# Patient Record
Sex: Female | Born: 1955 | Race: Black or African American | Hispanic: No | Marital: Single | Smoking: Never smoker
Health system: Southern US, Community
[De-identification: ages and names within clinical notes are randomized; demographics above are authoritative.]

## PROBLEM LIST (undated history)

## (undated) ENCOUNTER — Emergency Department (HOSPITAL_COMMUNITY): Admission: EM | Payer: BC Managed Care – PPO | Source: Home / Self Care

## (undated) DIAGNOSIS — Z85528 Personal history of other malignant neoplasm of kidney: Secondary | ICD-10-CM

## (undated) DIAGNOSIS — M1990 Unspecified osteoarthritis, unspecified site: Secondary | ICD-10-CM

## (undated) DIAGNOSIS — G4730 Sleep apnea, unspecified: Secondary | ICD-10-CM

## (undated) DIAGNOSIS — Z8614 Personal history of Methicillin resistant Staphylococcus aureus infection: Secondary | ICD-10-CM

## (undated) DIAGNOSIS — F329 Major depressive disorder, single episode, unspecified: Secondary | ICD-10-CM

## (undated) DIAGNOSIS — IMO0002 Reserved for concepts with insufficient information to code with codable children: Secondary | ICD-10-CM

## (undated) DIAGNOSIS — N393 Stress incontinence (female) (male): Secondary | ICD-10-CM

## (undated) DIAGNOSIS — M329 Systemic lupus erythematosus, unspecified: Secondary | ICD-10-CM

## (undated) DIAGNOSIS — N816 Rectocele: Secondary | ICD-10-CM

## (undated) DIAGNOSIS — N183 Chronic kidney disease, stage 3 unspecified: Secondary | ICD-10-CM

## (undated) DIAGNOSIS — Z87898 Personal history of other specified conditions: Secondary | ICD-10-CM

## (undated) DIAGNOSIS — C649 Malignant neoplasm of unspecified kidney, except renal pelvis: Secondary | ICD-10-CM

## (undated) DIAGNOSIS — Z8541 Personal history of malignant neoplasm of cervix uteri: Secondary | ICD-10-CM

## (undated) DIAGNOSIS — G2581 Restless legs syndrome: Secondary | ICD-10-CM

## (undated) DIAGNOSIS — C801 Malignant (primary) neoplasm, unspecified: Secondary | ICD-10-CM

## (undated) DIAGNOSIS — A630 Anogenital (venereal) warts: Secondary | ICD-10-CM

## (undated) DIAGNOSIS — R7303 Prediabetes: Secondary | ICD-10-CM

## (undated) DIAGNOSIS — G4733 Obstructive sleep apnea (adult) (pediatric): Secondary | ICD-10-CM

## (undated) DIAGNOSIS — K5909 Other constipation: Secondary | ICD-10-CM

## (undated) DIAGNOSIS — E785 Hyperlipidemia, unspecified: Secondary | ICD-10-CM

## (undated) DIAGNOSIS — R569 Unspecified convulsions: Secondary | ICD-10-CM

## (undated) DIAGNOSIS — I10 Essential (primary) hypertension: Secondary | ICD-10-CM

## (undated) DIAGNOSIS — Q600 Renal agenesis, unilateral: Secondary | ICD-10-CM

## (undated) DIAGNOSIS — K469 Unspecified abdominal hernia without obstruction or gangrene: Secondary | ICD-10-CM

## (undated) DIAGNOSIS — Z86711 Personal history of pulmonary embolism: Secondary | ICD-10-CM

## (undated) DIAGNOSIS — K432 Incisional hernia without obstruction or gangrene: Secondary | ICD-10-CM

## (undated) DIAGNOSIS — Z973 Presence of spectacles and contact lenses: Secondary | ICD-10-CM

## (undated) DIAGNOSIS — E559 Vitamin D deficiency, unspecified: Secondary | ICD-10-CM

## (undated) DIAGNOSIS — F419 Anxiety disorder, unspecified: Secondary | ICD-10-CM

## (undated) DIAGNOSIS — I2699 Other pulmonary embolism without acute cor pulmonale: Secondary | ICD-10-CM

## (undated) HISTORY — DX: Major depressive disorder, single episode, unspecified: F32.9

## (undated) HISTORY — DX: Restless legs syndrome: G25.81

## (undated) HISTORY — DX: Vitamin D deficiency, unspecified: E55.9

## (undated) HISTORY — DX: Unspecified osteoarthritis, unspecified site: M19.90

## (undated) HISTORY — DX: Unspecified abdominal hernia without obstruction or gangrene: K46.9

## (undated) HISTORY — DX: Malignant (primary) neoplasm, unspecified: C80.1

## (undated) HISTORY — DX: Unspecified convulsions: R56.9

## (undated) HISTORY — PX: ABDOMINAL HYSTERECTOMY: SHX81

## (undated) HISTORY — DX: Other pulmonary embolism without acute cor pulmonale: I26.99

## (undated) HISTORY — PX: OTHER SURGICAL HISTORY: SHX169

## (undated) HISTORY — DX: Other constipation: K59.09

## (undated) HISTORY — DX: Essential (primary) hypertension: I10

## (undated) HISTORY — PX: COLONOSCOPY: SHX174

## (undated) HISTORY — DX: Prediabetes: R73.03

## (undated) HISTORY — DX: Reserved for concepts with insufficient information to code with codable children: IMO0002

## (undated) HISTORY — DX: Malignant neoplasm of unspecified kidney, except renal pelvis: C64.9

## (undated) HISTORY — DX: Hyperlipidemia, unspecified: E78.5

## (undated) HISTORY — DX: Anogenital (venereal) warts: A63.0

---
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
## 1998-02-25 ENCOUNTER — Ambulatory Visit (HOSPITAL_COMMUNITY): Admission: RE | Admit: 1998-02-25 | Discharge: 1998-02-25 | Payer: Self-pay | Admitting: Obstetrics

## 1998-02-25 ENCOUNTER — Encounter: Admission: RE | Admit: 1998-02-25 | Discharge: 1998-02-25 | Payer: Self-pay | Admitting: Obstetrics

## 1999-08-16 ENCOUNTER — Emergency Department (HOSPITAL_COMMUNITY): Admission: EM | Admit: 1999-08-16 | Discharge: 1999-08-16 | Payer: Self-pay | Admitting: Emergency Medicine

## 1999-09-21 ENCOUNTER — Other Ambulatory Visit: Admission: RE | Admit: 1999-09-21 | Discharge: 1999-09-21 | Payer: Self-pay | Admitting: Gynecology

## 1999-09-30 ENCOUNTER — Other Ambulatory Visit: Admission: RE | Admit: 1999-09-30 | Discharge: 1999-09-30 | Payer: Self-pay | Admitting: Gynecology

## 1999-09-30 ENCOUNTER — Encounter (INDEPENDENT_AMBULATORY_CARE_PROVIDER_SITE_OTHER): Payer: Self-pay

## 1999-10-02 ENCOUNTER — Encounter: Payer: Self-pay | Admitting: Gynecology

## 1999-10-02 ENCOUNTER — Ambulatory Visit (HOSPITAL_COMMUNITY): Admission: RE | Admit: 1999-10-02 | Discharge: 1999-10-02 | Payer: Self-pay | Admitting: Gynecology

## 1999-10-05 ENCOUNTER — Encounter: Payer: Self-pay | Admitting: Gynecology

## 1999-10-05 ENCOUNTER — Ambulatory Visit (HOSPITAL_COMMUNITY): Admission: RE | Admit: 1999-10-05 | Discharge: 1999-10-05 | Payer: Self-pay | Admitting: Gynecology

## 1999-10-06 ENCOUNTER — Encounter: Payer: Self-pay | Admitting: Gynecology

## 1999-10-07 ENCOUNTER — Ambulatory Visit (HOSPITAL_COMMUNITY): Admission: RE | Admit: 1999-10-07 | Discharge: 1999-10-07 | Payer: Self-pay | Admitting: Gynecology

## 1999-10-07 ENCOUNTER — Encounter (INDEPENDENT_AMBULATORY_CARE_PROVIDER_SITE_OTHER): Payer: Self-pay

## 1999-10-22 ENCOUNTER — Ambulatory Visit (HOSPITAL_COMMUNITY): Admission: RE | Admit: 1999-10-22 | Discharge: 1999-10-22 | Payer: Self-pay | Admitting: Gastroenterology

## 1999-10-22 ENCOUNTER — Encounter (INDEPENDENT_AMBULATORY_CARE_PROVIDER_SITE_OTHER): Payer: Self-pay | Admitting: Specialist

## 1999-10-28 ENCOUNTER — Ambulatory Visit (HOSPITAL_COMMUNITY): Admission: RE | Admit: 1999-10-28 | Discharge: 1999-10-28 | Payer: Self-pay | Admitting: Gastroenterology

## 1999-11-05 ENCOUNTER — Ambulatory Visit (HOSPITAL_COMMUNITY): Admission: RE | Admit: 1999-11-05 | Discharge: 1999-11-05 | Payer: Self-pay | Admitting: Gastroenterology

## 1999-11-05 ENCOUNTER — Encounter: Payer: Self-pay | Admitting: Gastroenterology

## 1999-11-06 ENCOUNTER — Encounter: Payer: Self-pay | Admitting: Gastroenterology

## 1999-11-06 ENCOUNTER — Ambulatory Visit (HOSPITAL_COMMUNITY): Admission: RE | Admit: 1999-11-06 | Discharge: 1999-11-06 | Payer: Self-pay | Admitting: Gastroenterology

## 1999-11-10 ENCOUNTER — Ambulatory Visit: Admission: RE | Admit: 1999-11-10 | Discharge: 1999-11-10 | Payer: Self-pay | Admitting: Gynecology

## 1999-11-19 ENCOUNTER — Encounter: Payer: Self-pay | Admitting: Gynecology

## 1999-11-24 ENCOUNTER — Inpatient Hospital Stay (HOSPITAL_COMMUNITY): Admission: RE | Admit: 1999-11-24 | Discharge: 1999-11-28 | Payer: Self-pay | Admitting: Gynecology

## 1999-11-24 ENCOUNTER — Encounter (INDEPENDENT_AMBULATORY_CARE_PROVIDER_SITE_OTHER): Payer: Self-pay | Admitting: Specialist

## 2000-01-03 ENCOUNTER — Emergency Department (HOSPITAL_COMMUNITY): Admission: EM | Admit: 2000-01-03 | Discharge: 2000-01-03 | Payer: Self-pay | Admitting: Emergency Medicine

## 2000-01-04 ENCOUNTER — Encounter: Payer: Self-pay | Admitting: Emergency Medicine

## 2000-01-08 ENCOUNTER — Emergency Department (HOSPITAL_COMMUNITY): Admission: EM | Admit: 2000-01-08 | Discharge: 2000-01-08 | Payer: Self-pay | Admitting: Emergency Medicine

## 2000-01-09 ENCOUNTER — Ambulatory Visit: Admission: RE | Admit: 2000-01-09 | Discharge: 2000-01-09 | Payer: Self-pay | Admitting: Gastroenterology

## 2000-01-09 ENCOUNTER — Encounter: Payer: Self-pay | Admitting: Gastroenterology

## 2000-01-10 ENCOUNTER — Emergency Department (HOSPITAL_COMMUNITY): Admission: EM | Admit: 2000-01-10 | Discharge: 2000-01-10 | Payer: Self-pay | Admitting: Emergency Medicine

## 2000-01-25 ENCOUNTER — Encounter: Payer: Self-pay | Admitting: Emergency Medicine

## 2000-01-25 ENCOUNTER — Inpatient Hospital Stay (HOSPITAL_COMMUNITY): Admission: EM | Admit: 2000-01-25 | Discharge: 2000-02-01 | Payer: Self-pay | Admitting: Emergency Medicine

## 2000-01-25 ENCOUNTER — Encounter: Payer: Self-pay | Admitting: Gynecology

## 2000-01-26 ENCOUNTER — Encounter: Payer: Self-pay | Admitting: Gynecology

## 2000-02-16 ENCOUNTER — Encounter: Admission: RE | Admit: 2000-02-16 | Discharge: 2000-02-16 | Payer: Self-pay | Admitting: Family Medicine

## 2000-02-18 ENCOUNTER — Encounter: Payer: Self-pay | Admitting: Emergency Medicine

## 2000-02-19 ENCOUNTER — Inpatient Hospital Stay (HOSPITAL_COMMUNITY): Admission: EM | Admit: 2000-02-19 | Discharge: 2000-02-23 | Payer: Self-pay | Admitting: Emergency Medicine

## 2000-02-20 ENCOUNTER — Encounter: Payer: Self-pay | Admitting: Internal Medicine

## 2000-03-03 ENCOUNTER — Encounter: Admission: RE | Admit: 2000-03-03 | Discharge: 2000-03-03 | Payer: Self-pay | Admitting: Family Medicine

## 2000-03-07 ENCOUNTER — Other Ambulatory Visit: Admission: RE | Admit: 2000-03-07 | Discharge: 2000-03-07 | Payer: Self-pay | Admitting: Gynecology

## 2000-03-08 ENCOUNTER — Encounter: Admission: RE | Admit: 2000-03-08 | Discharge: 2000-03-08 | Payer: Self-pay | Admitting: Family Medicine

## 2000-03-16 ENCOUNTER — Encounter: Admission: RE | Admit: 2000-03-16 | Discharge: 2000-03-16 | Payer: Self-pay | Admitting: Family Medicine

## 2000-03-24 ENCOUNTER — Encounter: Admission: RE | Admit: 2000-03-24 | Discharge: 2000-03-24 | Payer: Self-pay | Admitting: Family Medicine

## 2000-04-13 ENCOUNTER — Encounter: Admission: RE | Admit: 2000-04-13 | Discharge: 2000-04-13 | Payer: Self-pay | Admitting: Family Medicine

## 2000-04-20 ENCOUNTER — Encounter: Admission: RE | Admit: 2000-04-20 | Discharge: 2000-04-20 | Payer: Self-pay | Admitting: Family Medicine

## 2000-04-26 ENCOUNTER — Encounter: Admission: RE | Admit: 2000-04-26 | Discharge: 2000-04-26 | Payer: Self-pay | Admitting: Family Medicine

## 2000-05-04 ENCOUNTER — Encounter: Admission: RE | Admit: 2000-05-04 | Discharge: 2000-05-04 | Payer: Self-pay | Admitting: Family Medicine

## 2000-06-02 ENCOUNTER — Encounter: Admission: RE | Admit: 2000-06-02 | Discharge: 2000-06-02 | Payer: Self-pay | Admitting: Family Medicine

## 2000-06-13 ENCOUNTER — Encounter: Admission: RE | Admit: 2000-06-13 | Discharge: 2000-06-13 | Payer: Self-pay | Admitting: Family Medicine

## 2000-06-15 ENCOUNTER — Encounter: Admission: RE | Admit: 2000-06-15 | Discharge: 2000-06-15 | Payer: Self-pay | Admitting: Family Medicine

## 2000-07-13 ENCOUNTER — Encounter: Admission: RE | Admit: 2000-07-13 | Discharge: 2000-07-13 | Payer: Self-pay | Admitting: Family Medicine

## 2000-08-10 ENCOUNTER — Encounter: Admission: RE | Admit: 2000-08-10 | Discharge: 2000-08-10 | Payer: Self-pay | Admitting: Family Medicine

## 2000-09-07 ENCOUNTER — Encounter: Admission: RE | Admit: 2000-09-07 | Discharge: 2000-09-07 | Payer: Self-pay | Admitting: Family Medicine

## 2000-09-15 ENCOUNTER — Encounter: Admission: RE | Admit: 2000-09-15 | Discharge: 2000-09-15 | Payer: Self-pay | Admitting: Sports Medicine

## 2000-09-15 ENCOUNTER — Encounter: Payer: Self-pay | Admitting: Sports Medicine

## 2000-10-15 ENCOUNTER — Encounter (INDEPENDENT_AMBULATORY_CARE_PROVIDER_SITE_OTHER): Payer: Self-pay | Admitting: *Deleted

## 2000-10-15 LAB — CONVERTED CEMR LAB

## 2000-10-19 ENCOUNTER — Encounter: Admission: RE | Admit: 2000-10-19 | Discharge: 2000-10-19 | Payer: Self-pay | Admitting: Family Medicine

## 2000-11-03 ENCOUNTER — Encounter: Admission: RE | Admit: 2000-11-03 | Discharge: 2000-11-03 | Payer: Self-pay | Admitting: Family Medicine

## 2000-12-14 ENCOUNTER — Encounter: Admission: RE | Admit: 2000-12-14 | Discharge: 2000-12-14 | Payer: Self-pay | Admitting: Sports Medicine

## 2000-12-14 ENCOUNTER — Encounter: Payer: Self-pay | Admitting: Sports Medicine

## 2001-07-18 ENCOUNTER — Encounter: Admission: RE | Admit: 2001-07-18 | Discharge: 2001-07-18 | Payer: Self-pay | Admitting: Family Medicine

## 2001-07-26 ENCOUNTER — Encounter: Admission: RE | Admit: 2001-07-26 | Discharge: 2001-07-26 | Payer: Self-pay | Admitting: Family Medicine

## 2002-04-03 ENCOUNTER — Encounter: Admission: RE | Admit: 2002-04-03 | Discharge: 2002-04-03 | Payer: Self-pay | Admitting: Sports Medicine

## 2003-02-14 ENCOUNTER — Encounter: Admission: RE | Admit: 2003-02-14 | Discharge: 2003-02-14 | Payer: Self-pay | Admitting: Family Medicine

## 2003-03-20 ENCOUNTER — Encounter: Admission: RE | Admit: 2003-03-20 | Discharge: 2003-03-20 | Payer: Self-pay | Admitting: Family Medicine

## 2003-03-28 ENCOUNTER — Encounter: Admission: RE | Admit: 2003-03-28 | Discharge: 2003-03-28 | Payer: Self-pay | Admitting: Family Medicine

## 2003-04-09 ENCOUNTER — Encounter: Admission: RE | Admit: 2003-04-09 | Discharge: 2003-04-09 | Payer: Self-pay | Admitting: Family Medicine

## 2003-04-15 ENCOUNTER — Encounter: Admission: RE | Admit: 2003-04-15 | Discharge: 2003-04-15 | Payer: Self-pay | Admitting: Family Medicine

## 2003-04-22 ENCOUNTER — Encounter: Admission: RE | Admit: 2003-04-22 | Discharge: 2003-04-22 | Payer: Self-pay | Admitting: Family Medicine

## 2003-05-01 ENCOUNTER — Encounter: Admission: RE | Admit: 2003-05-01 | Discharge: 2003-05-01 | Payer: Self-pay | Admitting: Family Medicine

## 2003-05-06 ENCOUNTER — Encounter: Admission: RE | Admit: 2003-05-06 | Discharge: 2003-05-06 | Payer: Self-pay | Admitting: Family Medicine

## 2003-05-14 ENCOUNTER — Encounter: Admission: RE | Admit: 2003-05-14 | Discharge: 2003-05-14 | Payer: Self-pay | Admitting: Family Medicine

## 2003-05-14 ENCOUNTER — Inpatient Hospital Stay (HOSPITAL_COMMUNITY): Admission: AD | Admit: 2003-05-14 | Discharge: 2003-05-25 | Payer: Self-pay | Admitting: Family Medicine

## 2003-05-19 LAB — HM COLONOSCOPY

## 2003-06-15 ENCOUNTER — Inpatient Hospital Stay (HOSPITAL_COMMUNITY): Admission: EM | Admit: 2003-06-15 | Discharge: 2003-07-05 | Payer: Self-pay | Admitting: *Deleted

## 2003-06-18 ENCOUNTER — Encounter: Payer: Self-pay | Admitting: Internal Medicine

## 2003-06-19 ENCOUNTER — Encounter (INDEPENDENT_AMBULATORY_CARE_PROVIDER_SITE_OTHER): Payer: Self-pay | Admitting: *Deleted

## 2003-07-05 ENCOUNTER — Inpatient Hospital Stay (HOSPITAL_COMMUNITY)
Admission: RE | Admit: 2003-07-05 | Discharge: 2003-07-22 | Payer: Self-pay | Admitting: Physical Medicine & Rehabilitation

## 2003-08-07 ENCOUNTER — Encounter
Admission: RE | Admit: 2003-08-07 | Discharge: 2003-11-05 | Payer: Self-pay | Admitting: Physical Medicine & Rehabilitation

## 2004-01-07 ENCOUNTER — Encounter
Admission: RE | Admit: 2004-01-07 | Discharge: 2004-04-06 | Payer: Self-pay | Admitting: Physical Medicine & Rehabilitation

## 2004-01-14 ENCOUNTER — Other Ambulatory Visit: Admission: RE | Admit: 2004-01-14 | Discharge: 2004-01-14 | Payer: Self-pay | Admitting: Internal Medicine

## 2004-01-17 ENCOUNTER — Encounter: Admission: RE | Admit: 2004-01-17 | Discharge: 2004-01-17 | Payer: Self-pay | Admitting: Internal Medicine

## 2004-09-07 ENCOUNTER — Encounter: Admission: RE | Admit: 2004-09-07 | Discharge: 2004-09-07 | Payer: Self-pay

## 2005-02-01 ENCOUNTER — Encounter: Admission: RE | Admit: 2005-02-01 | Discharge: 2005-02-01 | Payer: Self-pay | Admitting: Internal Medicine

## 2005-05-18 LAB — HM DEXA SCAN

## 2005-09-14 ENCOUNTER — Ambulatory Visit: Payer: Self-pay | Admitting: Internal Medicine

## 2005-11-30 ENCOUNTER — Ambulatory Visit: Payer: Self-pay | Admitting: Internal Medicine

## 2005-12-19 ENCOUNTER — Encounter: Admission: RE | Admit: 2005-12-19 | Discharge: 2005-12-19 | Payer: Self-pay | Admitting: Internal Medicine

## 2006-01-06 ENCOUNTER — Other Ambulatory Visit: Admission: RE | Admit: 2006-01-06 | Discharge: 2006-01-06 | Payer: Self-pay | Admitting: Obstetrics and Gynecology

## 2006-01-17 ENCOUNTER — Emergency Department (HOSPITAL_COMMUNITY): Admission: EM | Admit: 2006-01-17 | Discharge: 2006-01-17 | Payer: Self-pay | Admitting: *Deleted

## 2006-02-02 ENCOUNTER — Encounter: Admission: RE | Admit: 2006-02-02 | Discharge: 2006-02-02 | Payer: Self-pay | Admitting: Internal Medicine

## 2006-07-14 DIAGNOSIS — K5909 Other constipation: Secondary | ICD-10-CM | POA: Insufficient documentation

## 2006-07-14 DIAGNOSIS — Z9109 Other allergy status, other than to drugs and biological substances: Secondary | ICD-10-CM | POA: Insufficient documentation

## 2006-07-14 DIAGNOSIS — M329 Systemic lupus erythematosus, unspecified: Secondary | ICD-10-CM | POA: Insufficient documentation

## 2006-07-14 HISTORY — DX: Other constipation: K59.09

## 2006-07-15 ENCOUNTER — Encounter (INDEPENDENT_AMBULATORY_CARE_PROVIDER_SITE_OTHER): Payer: Self-pay | Admitting: *Deleted

## 2007-02-14 ENCOUNTER — Encounter: Admission: RE | Admit: 2007-02-14 | Discharge: 2007-02-14 | Payer: Self-pay | Admitting: Internal Medicine

## 2008-02-15 ENCOUNTER — Encounter: Admission: RE | Admit: 2008-02-15 | Discharge: 2008-02-15 | Payer: Self-pay | Admitting: Internal Medicine

## 2008-05-18 LAB — HM PAP SMEAR: HM Pap smear: NEGATIVE

## 2009-02-24 ENCOUNTER — Encounter: Admission: RE | Admit: 2009-02-24 | Discharge: 2009-02-24 | Payer: Self-pay | Admitting: Internal Medicine

## 2010-02-25 ENCOUNTER — Encounter: Admission: RE | Admit: 2010-02-25 | Discharge: 2010-02-25 | Payer: Self-pay | Admitting: Internal Medicine

## 2010-10-02 NOTE — H&P (Signed)
NAME:  Beth, [name] NO.:  [id number]   MEDICAL RECORD NO.:  [id number]                   PATIENT TYPE:  INP   LOCATION:  0108                                 FACILITY:  [facility]   PHYSICIAN:  Leonia Reeves, MD                 DATE OF BIRTH:  Aug 17, 1955   DATE OF ADMISSION:  06/14/2003  DATE OF DISCHARGE:                                HISTORY & PHYSICAL   HISTORY OF PRESENT ILLNESS:  Beth [name] is a 55 year old, black female with  known lupus and history of lupus with arthritis, most recently in January  2005, when she was admitted by family practice at [facility] with altered  mental status.  Patient can only supply a limited history today, so most of  the history is obtained from the records.  The patient had a witnessed  seizure this evening at approximately 5 p.m.  It was witnessed by her  mother.  EMS' reports state that the mother describes a grand mal seizure  few minutes in duration.  This is supported by the ER attending doctor's  note, who was also able to speak with the mother.  The patient did have  urinary incontinence.  There was no head injury and no biting of her tongue.  The patient herself is unable to give any account of what happened tonight  but did state that she was in her normal state of health earlier today.  She  does have a history of a seizure in October 2002, that, according to the  medical records, on E chart was described as a generalized seizure.  During  her most recent hospitalization in January 2005, she was seen in  consultation by neurology, who, among other things, recommended  hypercoagulable work-up that included a normal lupus anticoagulant,  cardiolipin, and antiphospholipid antibody.  She was found to be ANA  positive and double-stranded DNA negative.  Her sed rate at that time was  _______.   PAST MEDICAL HISTORY:  1. History of lupus.     A. 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     B. 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     C. Hypercoagulable work-up in January 2005 was negative.  This included        lupus anticoagulant, cardiolipin, and antiphospholipid antibody        negative.     D. Diagnosis was made at an unknown date, but she is ANA positive and        double-stranded DNA negative.  2. Depression.     A. The patient was previously on Zoloft.  3. History of Meckel's diverticulum resection 2001.   MEDICATIONS:  1. Plaquenil 200 p.o. daily.  2. Prednisone thought to be 40 mg once daily.  3. Aspirin 325 daily.  4. The patient had been recommended to take calcium and vitamin D,  but she     is not.   REVIEW OF SYSTEMS:  The patient denies headache, chest pain, nausea,  vomiting, diarrhea, dysuria.  She does state that she has a little bit of  dyspnea which is normal for her.   SOCIAL HISTORY:  The patient is a nonsmoker.  No alcohol use.  She is  unemployed.  She is single.  She has 3 adult kids.  She has 2 years of  college.   FAMILY HISTORY:  Her mother is 52 and is alive and well.  Father's health is  unknown.  Her children are healthy.   PHYSICAL EXAMINATION:  VITAL SIGNS:  Blood pressure 101/54, temperature  101.1, pulse 75, respirations 18, pulse oxygen 94% on room air.  GENERAL:  The patient is lethargic but easily aroused.  Will speak in very  short sentences.  NEUROLOGIC:  The patient is alert and oriented.  No cranial nerve  abnormalities.  Strength is globally weak, about 4+/5 but no focal deficits.  Reflexes are hyperactive bilaterally upper and lower extremities.  Babinski  is withdraw.  HEENT:  Pupils equal, round, and reactive to light.  Extraocular muscles  intact.  Tongue has a thick coating on it.  Throat is slightly erythematous.  LUNGS:  Clear to auscultation bilaterally.  NECK:  No lymphadenopathy, no carotid bruit.  HEART:  Regular rate and rhythm.  No murmurs, rubs, or gallops.   ABDOMEN:  Positive bowel sounds, soft, nontender, nondistended.  EXTREMITIES:  No edema.  No skin rashes.   LABORATORY DATA:  Pertinent findings included hemoglobin 11.5, glucose 152,  albumin 2.2, AST 51, positive leukocyte esterases with 0-2 white blood  cells.  MRI showed a 5 mm increased density in the high left frontal lobe,  representing a parenchymal calcification versus a tiny hemorrhage.   ASSESSMENT/PLAN:  1. Witnessed generalized seizure.  Most likely this represents another flare     of lupus cerebritis.  Therefore, will increase her prednisone back up to     60 daily.  Will continue the Plaquenil.  Due to the findings on the CT     scan, we will check an MRI of the head to rule out any significant     hemorrhage.  Since I am uncertain at this time whether or not she has any     hemorrhage, will not use her aspirin which she takes as a home     medication.  We will also work up any other possible etiologies of     seizures including metabolic derangement, infection, and drugs.  The     patient has already been worked up extensively.  Her hypercoagulability     work-up has all been negative, so will not repeat that today.  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  2. Depression.  Will not treat at this time until the current issue is     resolved.   We will consult neurology or rheumatology as needed in the morning.     Blanch Media, M.D.                   Leonia Reeves, MD    EB/MEDQ  D:  06/15/2003  T:  06/15/2003  Job:  [id number]

## 2010-10-02 NOTE — Discharge Summary (Signed)
[name], [name] NO.:  [id number]   MEDICAL RECORD NO.:  [id number]                   PATIENT TYPE:  IPS   LOCATION:  4005                                 FACILITY:  [facility]   PHYSICIAN:  Ellwood Dense, M.D.                DATE OF BIRTH:  July 02, 1955   DATE OF ADMISSION:  07/05/2003  DATE OF DISCHARGE:  07/22/2003                                 DISCHARGE SUMMARY   DISCHARGE DIAGNOSES:  1. Lupus cerebritis.  2. Left buttocks wound/decubitus.  3. History of lupus.  4. Leukopenia.  5. Seizure disorder.  6. History of anxiety and depression.  7. Anemia.  8. Status post yeast urinary tract infection.  9. Lacunar infarct.   HISTORY OF PRESENT ILLNESS:  Beth [name] was a 55 year old black female with  a history of lupus and lupus cerebritis in January 2005 and 2001, seizure,  depression. Presents at [facility] on June 14, 2003 with altered mental  status and seizures. Head CT reveals on January 28 an increase in  attenuation in high left frontal lobe. Diagnosis of lupus cerebritis.  Hospital course significant for lacunar infarct, left buttocks abscess,  status post transfusion, anemia status post hypokalemia, UTI, and dysphagia.  Echocardiogram with left ejection fraction of 55 to 65%, no evidence of  valvular regurgitation. PT report:  Transfers min assist 15 to 20 feet,  nonambulatory. The patient is presently on antibiotics for a buttocks  abscess until July 09, 2003. The patient was transferred to [facility]  rehab department on July 05, 2003. Rheumatologist, Dr. Phylliss Bob. Neurologist  is Dr. Lesia Sago. Primary care doctor is Dr. Franchot Mimes.   PAST MEDICAL HISTORY:  As above for anxiety and constipation.   MEDICATIONS PRIOR TO ADMISSION:  1. Prednisone 60 mg daily.  2. Aspirin 325 mg daily.  3. Calcium 1000 mg daily.  4. Vitamin D b.i.d.  5. Multivitamin p.o. b.i.d.  6. Plaquenil.  7. Tylenol.   SOCIAL HISTORY:  The  patient is currently unemployed, single, has three  children. One-level home. Two year college education. Denies any tobacco or  alcohol use. Independent prior to admission. Lives with  mother and sister  and supportive son.   FAMILY HISTORY:  Noncontributory.   REVIEW OF SYSTEMS:  Significant for fevers, double vision, blurred vision,  shortness of breath.   PAST SURGICAL HISTORY:  Significant for hysterectomy.   HOSPITAL COURSE:  The patient was admitted to [facility] rehab department on  July 05, 2003 for comprehensive patient rehabilitation where she  received more than three hours of therapy daily. Hospital course significant  for the following:  1. Lupus cerebritis. Throughout her hospital stay, the patient remained on     prednisone 40 mg one daily. The patient was periodically by Dr. Lesia Sago who recommended patient continue prednisone until follow with     neurology once discharged.  The patient has significant memory and     cognitive deficits. At first, patient unable to answer yes and no     questions adequately. The patient did improve some slightly with memory     book. The patient remained on Lovenox subcu daily for DVT prophylaxis.     From a physical standpoint, the patient was discharged supervision able     to ambulate 150 feet and can transfer sit to stand supervision modified     independent bed mobility modified independent. The patient still had     significant diffuse cognition deficit, decreased coordination, and     decreased balance, but overall made progress very well during her stay in     rehab. The patient was sent home with 24-hour supervision provided by     family. The patient able improve with her memory using memory strategy.     The patient was able to tolerate therapies very well.  2. Left buttocks wound/decubitus. Wound care was performed daily by nurse.     Dressing was changed very well. She was probable a Stage 3 decubitus. The      patient will receive a home health nurse for dressing changes daily. The     patient completed antibiotics on July 09, 2003.  3. History of lupus. The patient remained on Plaquenil 200 mg p.o. daily and     will follow with Dr. Phylliss Bob outpatient in two weeks.  4. History of seizure disorder. The patient initially was placed on     Dilantin. Due to significant leukopenia, Dilantin was discontinued on     July 10, 2003. The patient was originally on Depakote as well, and     this was also discontinued.  5. New lacunar infarct. The patient had no new neurologic symptoms while in     rehab. She remained on aspirin 81 mg p.o. daily without any bleeding     complications noted.  6. Leukopenia. The patient had a white blood cell count that was performed     on February 21 was 2.9; repeat on February 22 was 1.5. White blood cell     count performed periodically after decreasing her Depakote and Dilantin.     The latest white blood cell count began to improve to 3.9.  7. Anemia. The patient had admission hemoglobin of 8.8. Repeat performed on     July 11, 2003 was 7.9. She was transfused 2 units of packed red blood     cells. Repeat performed post transfusion hemoglobin was 11.1. The patient     remained on Trinsicon one tablet b.i.d. throughout entire stay in rehab.  8. Status post yeast urinary tract infection. The patient was started on one     dose of Diflucan 150 mg p.o. x1. Due to decreased white blood cell count,     she only received one dose of Diflucan.  9. Muscle spasms. The patient received Robaxin 500 mg p.o. q.8h. as needed     for muscle spasms on July 15, 2003.  10.      Safety. At the beginning of rehab, the patient did have one episode     of fall when she got out of the bed without assistance. Bed alarm and all     side rails were placed x4, but this was discontinued as patient's     awareness improved.  No other major issues occurred patient was in rehab. Latest  labs:  Latest  hemoglobin 11.1, hematocrit 33.1,  white blood cell count 3.9, platelet count  440. Latest Dilantin level less than 2.5. Valproic acid 33.4. This was  performed on July 08, 2003. Presently Dilantin and Depakote are  discontinued.   At time of discharge, all vitals were stable. Family education was  completed. She discharged at a supervision, light min assist level. PT  report indicated that patient is presently ambulating 155 feet rolling  walker supervision, bed to chair transfers mod assist supervision level, bed  mobility modified independently. He will perform most ADLs supervision to  modified independent level. Overall from PT/OT standpoint, the patient  progressed towards many of her OT long term goals, exceeding some of her  goals. The patient continued to decreased __________ overall from a PT  standpoint, but has made significant improvement in control and mobility.  From a speech standpoint, the patient has made progress towards goals and  has _________. The patient able to answer yes and no questions with about  80% accuracy. The patient able to recall word list with 80% accuracy using  memory strategies. The patient was discharged home with son.   DISCHARGE MEDICATIONS:  1. Plaquenil 200 mg p.o. daily.  2. Vitamin B1 40 mg daily.  3. Aspirin 81 mg daily.  4. Prednisone 40 mg daily.  5. Neurontin 600 mg p.o. b.i.d.  6. Oxycodone one to two tablets p.o. q.4-6h. p.r.n.  7. Tylenol.   The patient requires 24-hour supervision. Pain management with oxycodone and  Tylenol. No smoking. No drinking alcohol. No driving. Use walker. She has a  regular diet. Home health [facility] for PT, OT, speech, and RN to monitor  sacral wound to change dressings daily. Follow up with Dr. Phylliss Bob. RN will  teach family how to change sacral dressing, decubitus dressing, buttock  dressing. Dr. Phylliss Bob, call for appointment. Dr. Anne Hahn in two to three weeks.  Dr. Franchot Mimes within  four to six weeks. Dr. Ellwood Dense as needed.      Beth [name], P.A.                         Ellwood Dense, M.D.    LB/MEDQ  D:  07/22/2003  T:  07/23/2003  Job:  [id number]   cc:   Areatha Keas, M.D.  [address]  [address]  [place]  [place] [address]  Fax: [phone]   C. Lesia Sago, M.D.  [address]  [address]  [employer] [place]  [place] [address]  Fax: [phone]   Franchot Mimes, MD  Fax: [phone]

## 2010-10-02 NOTE — Discharge Summary (Signed)
[place]  Patient:    Beth [name], Beth [name]                       MRN: [id number] Adm. Date:  [id number] Disc. Date: [id number] Attending:  Jetty Duhamel T Dictator:   Ilene Qua, M.D. CC:         Griffith Citron, M.D.  Helene Kelp, M.D.   Discharge Summary  DISCHARGE DIAGNOSES: 1. Systemic lupus erythematosus. 2. Fever of unknown origin. 3. Chronic abdominal pain.  DISCHARGE MEDICATIONS: 1. Prednisone taper. 2. Protonix 40 mg p.o. q.d. 3. Phenergan 25 mg p.o. q.4h. p.r.n. nausea or vomiting. 4. Plaquenil 200 mg p.o. b.i.d.  ACTIVITY:  She may increase activity as tolerated.  Return to work on Monday, February 08, 2000.  FOLLOW-UP:  The patient was instructed to follow up with Dr. Phylliss Bob of rheumatology in two to three weeks.  She is to call his office at [phone] for an appointment.  She was also instructed to go to her ophthalmologist on Friday, February 19, 2000, at 8:45 a.m. with Dr. Mitzi Davenport.  She is also scheduled to follow up with myself, Dr. Drue Second, at the family practice center on February 16, 2000, at 3:45 p.m.  She is also to follow up with Dr. Kinnie Scales, GI, as scheduled before or on a p.r.n. basis.  CONSULTATIONS: 1. Infectious disease, Dr. Roxan Hockey. 2. Rheumatology, Dr. Phylliss Bob.  PROCEDURES: 1. Echocardiogram, which was normal, with no vegetations evident. 2. Chest CT, which showed bilateral enlarged axillary nodes, enlarged    mediastinal nodes, increased soft tissue in the anterior mediastinum,    indeterminate etiology, multiple liver hemangiomas.  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  Both GI and GYN were consulted in the emergency room prior to medicine service  consult, and the patient was recommended for discharge but was reluctant to go home.  She complained of having constipation, not having a bowel movement in about a week.  She has also been having nausea and vomiting.  The nausea has been constant and worsens with food.  She also reports having fevers with oral thermometer at home registering 101-102 that occurs throughout the day.  She reported positive chills, positive night sweats.  She also reports nasal congestion as well.  GI had recommended empiric broad-spectrum antibiotic coverage with Protonix.  GYN did not think the fever was secondary to GYN source.  She was diagnosed with vaginitis secondary to BV and recommended MetroGel for this.  HOSPITAL COURSE: #1 - FEVER OF UNKNOWN ORIGIN:  It appeared that the patient gave a history of having basically a fever since her surgery in July and the ______ association was concerning for a postsurgical infection but also the differential of endocarditis, vasculitis, neoplastic process such as lymphoma, atypical infection like fungal or AFB or IBD were entertained.  She underwent an extensive workup including a rule out of sinusitis with a normal CT, a normal chest x-ray to rule out pulmonary infection.  She was treated for a vaginitis with MetroGel.  There was no murmur on on cardiovascular exam, and she did undergo an echocardiogram to rule this out as well, which was normal. Other workup included a CT of the chest, abdomen, and pelvis, multiple urine and  blood cultures, and all of this was negative for infection.  As for the source of her infection, this did point towards some type of connective tissue disease.  Over the course of her hospital stay, her fever curve did improve after being started on steroids and medications for her likely systemic lupus. On the day prior to discharge, the patient was afebrile for greater than 48 hours.  All of her cultures as mentioned above and blood  cultures were negative to date.  #2 - SYSTEMIC LUPUS ERYTHEMATOSUS:  The patient underwent an extensive workup which included the following:  A sedimentation rate which was found to be 40, ANA was positive with 1:40 ratio of speckled pattern.  Anti-double-stranded DNA was positive.  An ENA panel was pending at the time of the discharge summary.  EBV IgG antibody was 14.26, EBV IgM antibody was 0.03.  C4 was less than 10, C3 was 31.  ANCA is pending at the time of this discharge summary. Rheumatoid factor was 81.  Hemoglobin electrophoresis is pending.  CMV serology was 0.60, HIV was negative.  Chest CT did reveal bilateral enlarged axillary nodes, enlarged mediastinal nodules, increased soft tissue in the anterior mediastinum, but was indeterminate with multiple liver hemangiomas. For this reason, infectious disease consult was obtained to rule in and to evaluate for other etiologies of her fever, but they concurred that this was probably a connective tissue disorder and recommended a rheumatology consult, which was obtained on January 30, 2000.  Dr. Phylliss Bob felt that the patient would benefit from starting Plaquenil along with empiric prednisone taper and follow her up as an outpatient.  He was concerned of the possibility of a possible thymoma that can be associated with SLE syndrome, given her mediastinal fullness on CT scan, but this was reviewed with the radiologist and was felt not to be a possibility.  After starting the prednisone and Plaquenil, the patient subjectively felt much improved and on the day of discharge was afebrile and felt well.  CONDITION ON DISCHARGE:  Improved.  DISPOSITION:  The patient was discharged to her home. DD:  03/13/00 TD:  03/13/00 Job: [id number]

## 2010-10-02 NOTE — Procedures (Signed)
[facility]  Patient:    Beth [name], Beth [name]                       MRN: [id number] Proc. Date: 10/28/99 Adm. Date:  [id number] Disc. Date: [id number] Attending:  Deneen Harts CC:         Douglass Rivers, M.D.                           Procedure Report  PROCEDURE:  Colonoscopy.  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 Possible metastatic disease based on constitutional symptoms, abdominal pain, weight loss, and abdominal CT revealing several hepatic lesions and a left pelvic 2 cm mass. The patient has been experiencing diffuse abdominal pain, significant weight loss. No prior colorectal neoplasia surveillance. Undergoing colonoscopy to rule out chronic involvement with possible metastatic disease. Prior to anticipated surgery.  DESCRIPTION OF PROCEDURE:  After reviewing the nature of the procedure with the patient including potential risks and complications, and after discussing alternative methods of diagnosis and treatment, informed consent was signed.  The patient was premedicated receiving IV sedation totaling Versed 6 mg, Fentanyl 75 mcg administered in divided doses prior to and during the course of the procedure.  Using the Olympus pediatric PCF-140L video colonoscope, the rectum was intubated after a normal digital examination. The scope was inserted and advanced without difficulty around the entire length of the colon to the cecum, identified by the appendiceal orifice and ileocecal valve. Excellent preparation throughout, the patient having used Visicol tablets.  The scope was slowly withdrawn with careful inspection of the entire colon in a retrograde manner. Excellent visualization throughout including retroflexed view in the vault.  The only abnormality were internal hemorrhoids in the rectum on retroflexed view. These were non-inflamed. There was no  evidence of neoplasia, diverticular change, mucosal inflammation or vascular abnormality.  The colon was decompressed, scope withdrawn.  The patient tolerated the procedure without difficulty being maintained on DataScope monitor low flow oxygen throughout. The patient returned to recovery in stable condition.  Time 1, technical 1, preparation 1 (Visicol), total score equal 3.  ASSESSMENT: 1. Normal colonoscopy--no neoplasia. 2. Internal hemorrhoids.  RECOMMENDATION: 1. Colonoscopy in 10 years overall health permitting. 2. Hemoccults--annually per Dr. Farrel Gobble. DD:  10/28/99 TD:  10/31/99 Job: [id number]

## 2010-10-02 NOTE — Procedures (Signed)
[facility]  Patient:    Beth [name], Beth [name]                       MRN: [id number] Proc. Date: 02/22/00 Adm. Date:  [id number] Attending:  Jetty Duhamel T                           Procedure Report  INDICATIONS FOR PROCEDURE: The patient has a known history of lupus erythematosus with single seizure at the time of admission and tremor raising question of cerebritis. Studies to date have shown an EEG which was negative, an MRI which shows a few white matter along with T2 signal changes present mostly in the left frontal region and an MRA raising the question of previous left transverse sinus and jugular bulb occlusion.  DESCRIPTION OF PROCEDURE:  The patient was prepped and draped in the left lateral decubitus position. The L3-4 interspace was entered without difficulty. Opening pressure was 110 mm of water and clear colored CSF was sent for VDRL, protein, glucose, cell count and DIFF, C3, C4 and IgG and oligoclonal IgG. The patient tolerated the procedure well. DD:  02/22/00 TD:  02/22/00 Job: [id number]

## 2010-10-02 NOTE — Op Note (Signed)
[facility]  Patient:    Beth [name], Beth [name]                       MRN: [id number] Proc. Date: 11/24/99 Adm. Date:  [id number] Disc. Date: [id number] Attending:  Jeannette Corpus CC:         Douglass Rivers, M.D.             Telford Nab, N.P.                           Operative Report  PREOPERATIVE DIAGNOSIS:  Carcinoma in situ of the cervix, status post cold knife conization, positive surgical margin.  Rule out invasion.  Enlarged pelvic lymph nodes.  POSTOPERATIVE DIAGNOSIS:  Carcinoma in situ of the cervix, status post cold knife conization, positive surgical margin.  Rule out invasion.  Enlarged pelvic lymph nodes.  PROCEDURE:  Exploratory laparotomy, modified radical hysterectomy, pelvic lymphadenectomy, resection of Meckels diverticulum.  SURGEON:  Daniel L. Clarke-Pearson, M.D.  ASSISTANT:  Douglass Rivers, M.D. and Telford Nab, N.P.  ANESTHESIA:  General with orotracheal tube.  ESTIMATED BLOOD LOSS:  350 cc.  SURGICAL FINDINGS:  At exploratory laparotomy the upper abdomen was normal. The pelvic lymph nodes were enlarged but were likely reactive from prior conization  several weeks ago.  There were adhesions of the tubes and ovaries to the back of the uterus and broad ligament.  The uterus itself had some small (3.0 cm) fibroids. Frozen section of one enlarged left external iliac lymph node returned as reactive hyperplasia.  DESCRIPTION OF PROCEDURE:  The patient is brought to the operating room and after satisfactory attainment of general anesthesia, was placed in the modified lithotomy position in [place] stirrups.  The anterior abdominal wall, perineum, and vagina ere prepped with Betadine.  A Foley catheter was placed, and the patient was draped. the abdomen was entered through a low midline incision.  The upper abdomen and pelvis were explored with the above-noted findings, and then a Bookwalter retractor was  positioned.  The bowel was packed out of the pelvis.  The uterus was grasped with Kelly clamps, and the round ligaments were divided.  The retroperitoneal spaces were opened, identifying the pararectal and paravesical spaces, and the ureter.  The ovarian attachment to the uterus was crossclamped, including the fallopian tube.  This was divided and suture ligated, and free tied, thus preserving the ovaries.  Adhesions of the left ovary and sigmoid colon were lysed with sharp and blunt dissection.  The bladder flap was advanced with sharp and blunt dissection.  This was densely adherent to the lower uterine segment, secondary to prior cesarean sections.  The superior vesical artery was placed on traction, and skeletonized back to the origin of the ureter.  The ureter was mobilized from its attachments to the peritoneum and dissected down to where the uterine artery crosses the ureter.  At this point the uterine artery was divided and suture ligated.  In a step-wise fashion the ureter was further untunneled using right angle clamp and Anderson clamps, suture ligatures for hemostasis.  With the ureter mobilized and retracted laterally, the rectovaginal septum was developed  using sharp and blunt dissection.  The uterosacral ligaments were skeletonized.  The remaining parametria was crossclamped, as were the uterosacral ligaments, and these were divided and suture ligated.  The vaginal angles were then crossclamped allowing for approximately 1.0 cm of vaginal cuff circumferentially.  This was divided.  The vagina was incised approximately 1.0 cm from the cervix.  The uterus and cervix were handed off the operative field as a single specimen.  The vaginal angles were transfixed and the central portion of the vagina was closed with interrupted figure-of-eight sutures of #0 Vicryl.  In addition, the anterior peritoneum, cuff, and posterior cul-de-sac peritoneum were reapproximated  to achieve hemostasis in the cul-de-sac.  Attention was then turned to the pelvic lymph nodes.  The external iliac, hypogastric, and obturator lymph nodes were resected using sharp and blunt dissection.  Hemostasis was achieved with hemoclips.  The ureter and obturator nerve were identified and protected throughout this dissection.  The upper abdomen was explored, and the appendix noted to be normal.  A Meckels  diverticulum was noted approximately two feet from the ileocecal valve.  This was divided in a perpendicular axis using the GIA stapler.  A bleeding point was oversewn using #3-0 silk.  The remainder of the bowel was inspected and appeared normal.  The abdomen and pelvis were irrigated with copious amounts of warm saline and hemostasis ascertained.  The retractors and packs were removed, and the anterior abdominal wall was closed in layers, the first being a running Smead-Jones closure using #1 PDS.  The subcutaneous tissue was irrigated.  Hemostasis was achieved with cautery and the skin was closed with skin staples.  A dressing is  applied.  The patient was awakened from anesthesia and taken to the recovery room in satisfactory condition.  The sponge, needle, and instrument counts are correct 2. DD:  11/24/99 TD:  11/24/99 Job: [id number]

## 2010-10-02 NOTE — H&P (Signed)
Beth [name], Beth [name] NO.:  [id number]   MEDICAL RECORD NO.:  [id number]                   PATIENT TYPE:  INP   LOCATION:  0108                                 FACILITY:  [facility]   PHYSICIAN:  Blanch Media, M.D.             DATE OF BIRTH:  1956-03-10   DATE OF ADMISSION:  06/14/2003  DATE OF DISCHARGE:                                HISTORY & PHYSICAL   No dictation.                                               Blanch Media, M.D.    EB/MEDQ  D:  06/15/2003  T:  06/15/2003  Job:  [id number]

## 2010-10-02 NOTE — H&P (Signed)
Beth [name] NO.:  [id number]   MEDICAL RECORD NO.:  [id number]                   PATIENT TYPE:  INP   LOCATION:  3735                                 FACILITY:  [facility]   PHYSICIAN:  Pearlean Brownie, M.D.            DATE OF BIRTH:  01-07-1956   DATE OF ADMISSION:  05/14/2003  DATE OF DISCHARGE:                                HISTORY & PHYSICAL   CHIEF COMPLAINT:  Loss of memory x1 week with loss of balance and decreased  appetite.   HISTORY OF PRESENT ILLNESS:  Beth [name] is a 55 year old African-American  female patient of Dr. 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  She presented to work-in clinic and saw me on  December 15 at which time she was complaining of headaches, stiff neck, and  chills.  Given her history and physical at that time which included a normal  CBC and negative rapid Strep and rapid flu, I believed her to have a flu-  like illness possibly complicated by immunocompromise secondary to  Plaquenil.  She returned to clinic on December 20 and saw Dr. Leitha Schuller.  At  that time she had a normal urinalysis as well as a normal CBC and was  stopped on her Plaquenil, provided a prescription for Augmentin for possible  retroorbital sinusitis, and started on Diflucan for possible oral and  esophageal candidiasis.   She presents today to work-in clinic again with both of her sons.  At this  time she is significantly weakened, has impressive memory loss, ataxia,  decreased p.o., and a balance disturbance.  Her sons are concerned this may  be secondary to the Zoloft in that they think that she may have been taking  more than she is supposed to be taking due to confusion.   REVIEW OF SYSTEMS:  Positive for significant weakness with fatigue,  decreased p.o., ataxia, and confusion.  Negative for chest pain, shortness  of breath.   PAST MEDICAL HISTORY:  1. 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  2. Chronic depression.  3. Allergic conjunctivitis.  4. Constipation.  5. Shoulder strain.   PAST SURGICAL HISTORY:  1. TAH secondary to carcinoma in situ in July 2001.  2. Meckel's diverticulum resection in July 2001.   MEDICATIONS:  1. Plaquenil sulfate 200 mg p.o. daily.  2. Zoloft 50 mg p.o. daily.   ALLERGIES:  No known drug allergies.   SOCIAL HISTORY:  The patient lives with her mother and 80 year old son in  [place].  She denies use of alcohol, tobacco, and drugs.  She is  currently unemployed secondary to lupus and is applying for unemployment  benefits, but hopes to go back to work soon.   FAMILY HISTORY:  Mother is 50, alive and well.  Father's health is unknown.  Two sons who are apparently  healthy.   OBJECTIVE:  VITAL SIGNS:  Temperature 101.3, blood pressure 110/72, weight  132 which is down 6 pounds since I saw her two weeks ago.  GENERAL:  The patient is ill-appearing and is markedly different than when I  saw her on May 01, 2003.  She is somewhat confused and has obviously  poor insight.  HEENT:  PERRLA.  EOMI.  TMs clear bilaterally.  Oropharynx:  There is a thin  white coating on her tongue that does not appear candidal in nature.  NECK:  Supple.  No lymphadenopathy.  CARDIOVASCULAR:  Regular rate and rhythm without murmur.  LUNGS:  Clear to auscultation bilaterally.  ABDOMEN:  Soft, nontender.  EXTREMITIES:  Lower extremities:  No clubbing, cyanosis, edema.  NEUROLOGIC:  Cranial nerves grossly intact.  Sensation is grossly intact.  The patient does have hyperreflexia and clonus of the lower extremities.  She has gait ataxia with a negative Romberg, but diffuse weakness.  She is  oriented somewhat to person and place as well as time, although she is  obviously confused when discussing those things.  Her immediate recall is  poor and she has no long-term recall.  She is unable to do serial 7s even  once or  spell world forward much less backwards.   LABORATORIES:  No tests were performed today.  On December 15 she did have  an influenza negative and Strep negative.  On December 15 she also had a CBC  that was WNL.  On December 20 she had a UA that showed 100 protein, 0-3  wbc's, 1-5 rbc's, and 2+ bacteria.  WBC 4.8, hemoglobin 12.5, platelets  298,000.   ASSESSMENT/PLAN:  A 55 year old African-American female with lupus and  acutely altered mental status.  1. Altered mental status:  The differential here is vast including lupus     cerebritis and meningitis, but also many other causes.  At this point we     will admit the patient to telemetry and check various laboratories     including CBC, CMET, sedimentation rate, blood cultures, ammonia, urine     drug screen, urinalysis, and urine culture.  We will also encourage the     primary team to do an LP and to check a head CT.  We would also consult     neurology as the patient has seen Dr. Sandria Manly in the past.  She likely is     going to need high dose steroids if we are concerned that this is, in     fact, lupus cerebritis.  2. FEN:  The patient does have decreased p.o. which may be secondary to     candidiasis or secondary to her systemic process.  We will start her on 1     L normal saline bolus and then give D5 half normal saline with 20 KCl per     liter     at 150 mL/hour.  3. Questionable candidiasis with decreased p.o.:  The patient has taken     about a week's worth of Diflucan and thus is unlikely to still have     candidiasis.  I will start a regular diet as tolerated and follow her.      Jonah Blue, M.D.                      Pearlean Brownie, M.D.    Milas Gain  D:  05/14/2003  T:  05/14/2003  Job:  [id number]   cc:  Franchot Mimes, MD  [facility]  [address] [place]  [place] [address]  Fax: [phone]

## 2010-10-02 NOTE — Op Note (Signed)
[facility]  Patient:    Beth [name], Beth [name]                       MRN: [id number] Proc. Date: 10/07/99 Adm. Date:  [id number] Disc. Date: [id number] Attending:  Douglass Rivers                           Operative Report  PREOPERATIVE DIAGNOSIS:       Squamous cell on Pap.  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  OPERATION:                    Cold knife conization.  SURGEON:                      Douglass Rivers, M.D.  ASSISTANT:  ANESTHESIA:                   General anesthesia.  ESTIMATED BLOOD LOSS:         Minimal.  FINDINGS:                     Drawn in the chart.  Basically a large area on the inferior aspect of the cervix approximately spanning 5 to 6:30 and posteriorly f normal vessels and mosaicism.  There was also some abnormal vessels noted at around 12 to 1 oclock.  The tissue was noted to have very poor turgor.  Pathology was cone with a suture at 12 oclock and a punch biopsy deep in the canal at 2.  COMPLICATIONS:                None.  DESCRIPTION OF PROCEDURE:     The patient was taken to the operating room. General anesthesia was induced and placed in the dorsal lithotomy position.  She was prepped with Hibiclens.  A sterile weighted speculum was placed in the vagina. The cervix was visualized and two stay sutures were placed at 3 and 9 oclock for hemostasis with 2-0 Vicryl.  The galvinized speculum was then placed in the vagina. The colposcope was setup.  The cervix was injected with 10 cc of dilute Pitressin solution.  The cervix was then washed with acetic acid.  The areas of abnormality were noted.  The knife was then inserted at approximately 6 oclock and circumferentially carried through the cervix.  The base of the cone was excised. Because the tissue had poor turgor, the specimen needed to be held under tension during excision.  The specimen was orientated and a suture of silk was placed at 12 oclock.   Inspection of the cone showed [place] that it had gotten deep into the canal. There was a small whitened area at 2 oclock which was grasped with a keypunch and sent off as a second biopsy.  The area of concern at 5 oclock was transected, but not completely excised with the pathology specimen.  The base of the cone was treated with electrocautery.  The Monsels solution was then placed in the cone site. The stay sutures were cut.  The instruments were removed from the vagina.  The patient tolerated the procedure well.   Sponge, needle, and instrument counts were correct.  She was transferred to the PACU in stable condition. DD:  10/07/99 TD:  10/10/99 Job: [id number]

## 2010-10-02 NOTE — Discharge Summary (Signed)
NAMEJAYONNA, [name] NO.:  [id number]   MEDICAL RECORD NO.:  [id number]                   PATIENT TYPE:  INP   LOCATION:  1610                                 FACILITY:  [facility]   PHYSICIAN:  Beth [name], M.D.            DATE OF BIRTH:  Jul 15, 1955   DATE OF ADMISSION:  06/14/2003  DATE OF DISCHARGE:  07/05/2003                                 DISCHARGE SUMMARY   PRIMARY CARE PHYSICIAN:  Dr. Blanch [name].   CONSULTATIONS:  Beth [name], M.D., [facility].   DISCHARGE DIAGNOSES:  1. 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  2. History of lupus.  3. Depression.  4. History of Meckel's diverticulum.  5. Left buttock abscess with methicillin-sensitive Staphylococcus aureus.  6. Deconditioning.   DISCHARGE MEDICATIONS:  1. Augmentin 875 mg p.o. b.i.d.  2. Aspirin 81 mg p.o. daily.  3. Depakote 500 mg p.o. b.i.d.  4. Ensure p.o. t.i.d. meals.  5. Folic acid 1 mg p.o. daily.  6. Plaquenil 200 mg p.o. daily.  7. Novolog insulin 15 units subcu t.i.d.  8. Dilantin 100 mg p.o. q.h.s.  9. Prednisone 40 mg daily.  10.      Thiamine 100 mg p.o. daily.  11.      Tylenol 650 mg p.r.n. q.6h.  12.      Senokot two tabs p.o. daily.  13.      Percocet one to two tabs q.4h. p.r.n.   HISTORY OF PRESENT ILLNESS:  This is a 55 year old African-American female  with known lupus, a history of lupus with arthritis, most recently in  January 2005, who was admitted on June 14, 2003, with a limited history,  but apparently according to her mother had a witnessed seizure that evening.  It was described as a grand mal seizure lasting two minutes in duration.  This was additionally supported by the ER attending doctor's notes.  The  patient did have positive urinary incontinence.  No head injury, and no  biting of her tongue.  CT scan of the head was unremarkable.  The patient  was unable to give any account of what happened,  but was able to tell us  that she was in her usual state of health prior to this incident.  The  patient's last seizure was in October 2002, and was described as a general  seizure.  The patient had been admitted earlier on in the month over at  [facility] for a lupus flare-up with arthritis.  At that time, she had a hypercoagulable workup done by [facility]  which were normal.  She was found to be ANA positive, double stranded DNA  negative.  She was admitted for a generalized seizure felt to be secondary  to a flare-up of lupus cerebritis.  Her prednisone was increased to 60 mg.  She was continued on Plaquenil.  CT  scan of the head done on admission  showed a tiny focus with increased attenuation in the high left frontal  lobe.  Hemorrhage could not be ruled out, and therefore it was felt the  patient should get a MRI.  Neurology was consulted as well.   HOSPITAL COURSE:  1 -  SEIZURES:  She did not have any further seizures,  although she did maintain a decreased level of consciousness in the first  week of her stay.  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  It was felt that the acute  lunar infarction was not the cause of her stroke, and neurology felt it may  be more secondary to her lupus cerebritis.  The patient initially had been  loaded with Dilantin and this was continued.  Once she was stable, she was  changed over to Depakote 500 mg.  Her level was checked on June 01, 2003.  The patient, due to her decreased level of consciousness, had minimal p.o.  intake.  However, this improved as she became more alert and she was able to  fully properly take her medications.  Her dose of Depakote was 500 mg b.i.d.  on July 04, 2003.  Currently, from this standpoint she is stable.  She  was continued on aspirin as  well for her infarct.  2 -  BUTTOCK ABSCESS:  Initially, she was having some low-grade temps.  She  was noted on June 19, 2003, to have a small pin hole wound draining pus  with fluctuance on her left buttock.  At that time, cultures were obtained  and the patient was started on IV vancomycin as well as Zosyn given that she  was showing early signs of Staph aureus.  Once this was found to be  methicillin susceptible, she was changed over as well.  She was then changed  over to Augmentin which she has been able to tolerate p.o.  She continues to  receive wound and dressing changes daily.  The plan will be that once she  goes over to rehab she will have a wound care nurse follow there for further  recommendations.  3 -  HISTORY OF URINARY TRACT INFECTION:  Initially, she had been having  some dysuria.  She was found to have some white cells on her urine which  grew out Klebsiella.  __________ of antibiotics this covered too as well by  her initial dose and then Augmentin, and it was felt that her UTI was partly  treated.  4 -  DECONDITIONING:  Given her multiple problems with her multiple weakness  and abscess, it was felt that the patient would need further rehab.  This  was further evidenced on her day of discharge she tried to get herself of  bed and into a chair and was unable to support herself and fell to the  floor.  She suffered no severe injuries, although she was complaining of  some minor tailbone pain.  It is definitely felt that the patient initially  will need close supervision and assistance with her activities as she  receives rehab conditioning.  5 -  HISTORY OF LUPUS AND CEREBRITIS:  The patient was bounced up on  steroids initially at  60 mg.  This was able to be tapered down to 40 mg a day.  It is felt with  her planned long-term we can change her over to Imuran, however, this  medication will not be changed over until her buttock abscess is fully resolved.  The  Imuran will greatly interfere with her wound healing.  In  addition, further advise for this was given by Dr. Phylliss [name], by rheumatology.  The patient is felt to be medically stable for discharge on July 04, 2003.  If a rehab bed is not available, she will be discharged to rehab.   DISPOSITION:  She is improved.  She has had no more seizures.  She is more  conscious.  She is receiving antibiotics for her buttock wound.  In  addition, she is receiving aspirin for her CVA as well.   DIET:  Cardiac rehab diet.   FOLLOWUP:  1. She will follow up with the rehab doctors while she is there as well.  2. She will follow up with [facility] when she is fully     discharged from the hospital.                                               Beth [name], M.D.    SKK/MEDQ  D:  07/05/2003  T:  07/06/2003  Job:  [id number]   cc:   Beth [name], M.D.  [address]  [address]  [place]  [place] [address]  Fax: [phone]   Areatha Keas, M.D.  [address]  [address]  [place]  [place] [address]  Fax: [phone]

## 2010-10-02 NOTE — Consult Note (Signed)
[facility]  Patient:    Beth [name], Beth [name]                       MRN: [id number] Proc. Date: 11/10/99 Adm. Date:  [id number] Disc. Date: [id number] Attending:  Deneen Harts CC:         Douglass Rivers, M.D.             Griffith Citron, M.D.             Telford Nab, R.N.                          Consultation Report  HISTORY OF PRESENT ILLNESS:  This is a 55 year old African-American female referred by Dr. Douglass Rivers for consultation regarding management with a cervical lesion highly suspicious for invasion of cervical cancer.  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  These showed severe dysplasia in all biopsies.  Patient subsequently underwent a cold knife conization on May 23 which showed squamous cell carcinoma in situ, but then a cervical glandular extension and focal margin involvement.  Dr. Farrel Gobble describes the cervix as being soft and highly suspicious for cervical carcinoma.  Subsequently the patient has had a CAT scan which shows three enhancing lesions within the liver most consistent with hemangioma.  In addition there is a 2 cm rim enhancing lesion in the left pelvis just superior to the uterus, thought to be consistent with a necrotic enlarged pelvic lymph node in the iliac chain.  She also has fibroids of the uterus.  The patient reports that she has regular cycling menstrual periods.  She has no other past gynecologic history.  OBSTETRICAL HISTORY:  The patient has had three cesarean sections.  PAST MEDICAL HISTORY:  Medical Illnesses:  None.  PAST SURGICAL HISTORY:  Cesarean section x 3.  CURRENT MEDICATIONS:  Tylenol p.r.n.  SOCIAL HISTORY:  Patient is single.  She does not smoke or drink.  REVIEW OF SYSTEMS:  Patient has had considerable weight loss of approximately 15 pounds over the last several months and is currently  being evaluated by Dr. Sharrell Ku.  Apparently she was thought to have H. pylori gastritis and was placed on antibiotics, which she took 10 days before she developed a rash and has therefore discontinued them.  She notes that her abdominal pain is now resolved and her appetite has improved considerably.  The patient has no cardiovascular, pulmonary, neurologic, GU problems.  GI and GYN problems outlined in history of present illness.  PHYSICAL EXAMINATION:  VITAL SIGNS:  Height 5 feet 1-1/2 inches, weight 120, blood pressure 130/80.  GENERAL:  In general patient is a healthy female in no acute distress.  HEENT:  Negative.  NECK:  Supple without thyromegaly.  There is no supraclavicular, axillary, or inguinal adenopathy.  ABDOMEN:  Soft and nontender.  No masses, organomegaly, ascites, or hernias are noted.  She does have a well-healed midline incision.  PELVIC:  EGBUS is normal.  Vagina is clean.  Cervix is post conization and healing nicely.  Bimanual examination reveals anterior uterus which is slightly irregular and slightly enlarged.  There is no apparent parametrial involvement.  There are no adnexal masses.  Rectovaginal exam confirms.  IMPRESSION:  Cold knife conization showing carcinoma in situ with positive margin and endocervical gland involvement.  Pap smear showing squamous cell carcinoma.  Dr.  Lathrops clinical impression prior to conization was that the patient was very suspicious for cervical cancer.  In addition the CT scan showing an enlarged lymph node is also concerning.  I would recommend the patient undergo exploratory laparotomy, lymphadenectomy with frozen section, and a modified radical hysterectomy in order to make certain we have adequate margins in case she has an early invasive cervical cancer.  I did emphasize to the patient this is a slightly unconventional approach because we do not have a firm diagnosis of invasive cancer yet a high index of  suspicion.  She understands that this may be slight "overtreatment" but wishes to take this approach rather than to run the possibility of "undertreatment."  The risks of surgery including hemorrhage, infection, injury to adjacent viscera, thromboembolic complications, lymphedema, and anesthetic risks were outlined to the patient.  She accepts these risks and wishes to proceed with surgery as scheduled on July 3. DD:  11/10/99 TD:  11/10/99 Job: [id number]

## 2010-10-02 NOTE — Procedures (Signed)
[facility]  Patient:    Beth [name], Beth [name]                       MRN: [id number] Proc. Date: 10/22/99 Adm. Date:  [id number] Disc. Date: [id number] Attending:  Deneen Harts CC:         Douglass Rivers, M.D.                           Procedure Report  PROCEDURE:  Panendoscopy with biopsy.  INDICATION:  A 55 year old African-American female undergoing endoscopy to evaluate weight loss.  Patient has lost 20 pounds over the past year, associated GI symptoms of belching and bloating.  Also with constipation. Undergoing endoscopy to further evaluate possible GI etiology.  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  DESCRIPTION OF PROCEDURE:  After reviewing the nature of the procedure with the patient, including potential risks and complications, and after discussing alternative methods of diagnosis and treatment, informed consent was signed.  The patient was premedicated, receiving IV sedation totalling Versed 7 mg, fentanyl 62.5 mcg IV.  Using an Olympus video endoscope, proximal esophagus intubated under direct vision.  The oropharynx was normal without lesion of the epiglottis, vocal cords, or piriform sinus.  The proximal, mid-, and distal segments were normal with a distinct mucosal Z-line of 42 cm.  No hiatal hernia.  No evidence of reflux, neoplasia, or infection.  Gastric fundus, body, and antrum were normal.  Pylorus symmetric.  Duodenal bulb notable for patchy erythema consistent with a mild duodenitis.  The second portion of the duodenum appeared normal.  Scope withdrawn into the stomach, where retroflexed view of the angularis, lesser curve, gastric cardia and fundus were negative.  Biopsies obtained from the angularis and prepyloric antrum to rule out Helicobacter, given findings of duodenitis.  These turned immediately positive.  Scope advanced again into the second portion of the duodenum, where biopsies  were obtained to rule out celiac sprue, rule out other enteropathy.  Scope withdrawn to the stomach, which was decompressed.  Scope then removed.  The patient tolerated the procedure without difficulty, being maintained in Dayscope monitor, low-flow oxygen throughout.  The patient tolerated the procedure without difficulty, being maintained on Dayscope monitor, low-flow oxygen.  Tolerated the procedure without complication.  Returned to recovery in stable condition.  ASSESSMENT: 1. Duodenitis, mild.  Helicobacter biopsy immediately positive. 2. No lesion to explain weight loss.  Duodenal biopsies pending.  RECOMMENDATIONS: 1. Small bowel series. 2. 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 3. Prevpak, take as directed for 14 days. DD:  10/22/99 TD:  10/27/99 Job: [id number]

## 2010-10-02 NOTE — Cardiovascular Report (Signed)
NAME:  Beth [name], Beth [name]                          ACCOUNT NO.:  [id number]   MEDICAL RECORD NO.:  [id number]                   PATIENT TYPE:  AMB   LOCATION:  ENDO                                 FACILITY:  [facility]   PHYSICIAN:  Charlton Haws, M.D.                  DATE OF BIRTH:  28-Feb-1956   DATE OF PROCEDURE:  DATE OF DISCHARGE:                              CARDIAC CATHETERIZATION   PROCEDURE PERFORMED:  Transesophageal echocardiogram.   CARDIOLOGIST:  Charlton Haws, M.D.   INDICATIONS:  Lupus, recurrent strokes and rule out source of embolus.   DESCRIPTION OF PROCEDURE:  The patient was sedated with 2 mg of Versed.  Using digital technique an _________ multipurpose was advanced into the  distal esophagus without incident.  Transvascular imaging revealed mild-to-  moderate LVH.  Ejection fraction was 60%.  There were no wall motion  abnormalities.  Right-sided cardiac chambers were normal.  There was a PICC  line seen entering the SVC into the right atrium.  There was no clot seen on  it.   The mitral valve was mildly thick with mild MR.  The aortic valve was  trileaflet and normal.  Tricuspid and pulmonary valves were normal.  There  is no evidence of vegetations or clot.  The atrial septum was intact by  color flow and 2-D.  Bubble study was negative for right-to-left shunt.  Left atrial appendage had no significant thrombus had no significant  thrombus or spontaneous contrast and the aorta had no significant debris.   FINAL IMPRESSION:  1. No source of embolus.  2. No vegetations.  3. Normal tricuspid, aortic and pulmonary valves.  4. Structurally normal mitral valve with mild thickening and mild mitral     regurgitation.  5. Normal left ventricle, ejection fraction 60%.  6. Normal right ventricle with peripherally insertion central catheter line     seen in the superior vena cava and right atrium.  7. Small pericardial effusion.  8. No aortic debris.  9. No atrial septal  defect with negative bubble study.                                               Charlton Haws, M.D.    PN/MEDQ  D:  06/26/2003  T:  06/27/2003  Job:  [id number]

## 2010-10-02 NOTE — Assessment & Plan Note (Signed)
[facility]                           GASTROENTEROLOGY OFFICE NOTE   [name], [name]                       MRN:          [id number]  DATE:11/30/2005                            DOB:          09-12-1955    Beth [name] is a very nice 55 year old African-American female who has rectal  pain.  She has had an extensive GI evaluation by Dr. Kinnie Scales as well as by  myself and it appears that the rectal pain is actually pain in the coccygeal  area usually precipitated by sitting.  It all started after she had a  prolonged bed rest of about three months' duration due to severe systemic  lupus flare-up.  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  She was evaluated by Dr. Lesia Sago in the past.  Her rectal area has been hurting whenever she sits down  and it is also associated with difficulty in ambulation but she has made  progress in physical therapy and has been able to walk around and actually  work.  She is very tearful about her problem which has persisted now for  three years and is getting worse.  We have tried her on laxatives to improve  her bowel evacuation as well as on antispasmodics, but does not seem to  help.  I suggested to her that she has evaluation for coccydynia or  neuropathy in that area and that she may have a trial of a block in that  area to see if it could relieve the pain.  I was going to ask Dr. Danielle Dess if  he could see her and offer assistance in possibly doing a block in the area  of the coccyx or low sacrum, see if we could alleviate the rectal and pelvic  pain.  We will make arrangements for that.                                   Beth Morton. [name], MD   DMB/MedQ  DD:  11/30/2005  DT:  12/01/2005  Job #:  [id number]   cc:   Lovenia Kim, DO  Stefani Dama, MD

## 2010-10-02 NOTE — Assessment & Plan Note (Signed)
[name] is a 55 year old adult female being seen in the office today for  followup evaluation after a recent hospitalization. She was admitted on  June 13, 2003 with seizure and depression. She also had altered mental  status. Head CT revealed increase in attenuation in the high left frontal  lobe, and the diagnosis was consistent with lupus cerebritis. She had a  hospital course significant for lacunar infarct along with left buttock  abscess and anemia requiring transfusion. Echocardiogram showed a left  ventricular ejection fraction of 55 to 65% with no evidence of valvular  problems. She was eventually stabilized and was moved to the rehabilitation  unit July 05, 2003 and remained there through discharge July 22, 2003.   While on the rehabilitation unit, the patient did experience side effects  from the Beth [name] medication which were used for seizure  prophylaxis. She developed pancytopenia, and those two medications were both  discontinued by Beth [name]. He subsequently started her on Beth [name] 300 mg  two tablets twice a day, and she tolerated that medication without problems.  Her blood counts did improve towards the normal range.   The patient is followed as an outpatient by Beth [name], her rheumatologist,  for the lupus.   Since discharge, the patient reports that she has been walking on her own.  She is due to be discharged from physical therapy. She is bathing and  dressing on her own and plans to return to driving over the next several  days.   The patient does experience pain in her lower extremities, especially below  the knees. She does use the Beth [name] but also has used Beth [name] and reports  that she gets reasonable relief with that medication.   The patient is eating well at the present time. She would like to  discontinue the iron supplement.   The patient is due for a followup with Beth [name], her rheumatologist, September 02, 2003 and with her  neurologist, Beth [name], September 10, 2003. She has no  primary care physician, and she is not interested to see Beth [name].   MEDICATIONS:  1. Aspirin 325 mg q.d.  2. Beth [name] one tablet b.i.d.  3. Beth [name] 300 mg two tablets b.i.d.  4. Prednisone 20 mg two tablets q.d.  5. Beth [name] 5 mg one to two tablets q.4-6h. p.r.n.  6. Beth [name] 200 mg q.d.   PHYSICAL EXAMINATION:  Well appearing, middle-aged, adult female. Blood  pressure 113/73 with a pulse of 86, respiratory rate 16, and O2 saturation  100% on room air. The patient ambulates without any assistive device. She is  able to show 5-/5 strength throughout the bilateral upper and lower  extremities. Bulk and tone were normal, and reflexes were 2+ and  symmetrical. Sensation was only slightly decreased to light touch in the low  extremities below the knees.   IMPRESSION:  1. Lupus cerebritis.  2. Left buttock decubitus.  3. History of lupus.  4. Seizure disorder.  5. History of anemia.  6. History of anxiety/depression.   We did also review the patient's sore of her buttocks. That area is healing  well, and she continues to have daily dressing changes with the home health  nurse. There is excellent healing at that site. She is on no antibiotics for  that infection.   In terms of her medicines, I told her that she is to follow up with Beth [name]  as scheduled September 02, 2003 to see if she can wean  from the Beth [name] or  prednisone medication. Likewise, she needs to follow up with Beth [name]  September 10, 2003 to see if she can wean from the Beth [name] medication. I do  feel that she discontinue the Beth [name] as she reports a good appetite, and  that was started mostly for the anemia related to the side effects from the  Beth [name].   I will plan on seeing the patient in this office in approximately two  months' time. She is due to finish physical therapy with continuation of the  home health for the wound dressing. I  have given her refills on the  Beth [name], Beth [name], and Beth [name] medication. She understands that she  needs to ask Beth [name] about the Beth [name] and prednisone and Beth [name]  about the Beth [name] medication. She has a refill on her prednisone at the  present time.      Beth [name], M.D.   DC/MedQ  D:  08/12/2003 12:37:31  T:  08/12/2003 14:15:19  Job #:  [id number]

## 2010-10-02 NOTE — Consult Note (Signed)
NAMEVALLERI, HENDRICKSEN NO.:  [id number]   MEDICAL RECORD NO.:  [id number]                   PATIENT TYPE:  INP   LOCATION:  1610                                 FACILITY:  [facility]   PHYSICIAN:  Marlan Palau, M.D.               DATE OF BIRTH:  07-May-1956   DATE OF CONSULTATION:  06/15/2003  DATE OF DISCHARGE:                                   CONSULTATION   HISTORY OF PRESENT ILLNESS:  Beth [name] is a 55 year old right-handed  black female born 11/21/1955, with a history of lupus and lupus  cerebritis or vasculitis in the past. The patient has had a prior seizure  event and was treated with anticonvulsants prior to this admission. The  patient had a witnessed generalized seizure at home and was brought to the  emergency room. The patient has no recollection of the event. She did not  bite her tongue, lose control of the bowels or bladder. A CT scan of the  brain was done and appears to be essentially normal. There is a fleck of  high-density material in the right very high frontal area that may be  calcium or a very, very small hemorrhage. Blood is not likely. Neurology is  called for evaluation. The patient had been placed on Dilantin.   PAST MEDICAL HISTORY:  1. Seizure event as above.  2. History of altered mental status with lupus cerebritis in early January     2005.  3. Anxiety and depression.  4. Status post hysterectomy.  5. History of Meckel's diverticulum resection in 2001.  6. History of constipation.   MEDICATIONS:  Prior to admission include Plaquenil 200 mg daily, prednisone  with taper planned from 60  mg a day, aspirin 325 mg a day, calcium 1000 mg,  vitamin D one b.i.d., multivitamins daily, and Tylenol for pain.   The patient again has no known allergies. She does not smoke or drink.   SOCIAL HISTORY:  The patient is currently unemployed, single, and has three  children who are all adults.  The patient has a two-year  college education.  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   FAMILY MEDICAL HISTORY:  Mother is alive. Father's history is unknown. The  patient has a sister in her 33s. She has a family members history for  hypertension.   REVIEW OF SYSTEMS:  Notable for some occasional fevers on and off. The  patient denies headache. She has had some double vision, blurred vision. She  does note some shortness of breath. She denies chest pain. She denies any  nausea, vomiting, constipation, trouble controlling the bowels and bladder.  Denies any dizziness or blackouts other than the seizure event as above.   PHYSICAL EXAMINATION:  VITAL SIGNS: Blood pressure is 92/50, heart rate 54,  respiratory rate 20, temperature afebrile.  GENERAL: This patient is a thin black female who  is alert and cooperative at  the time of examination.  HEENT: Head is atraumatic.  Pupils equal, round, and reactive to light.  Disks are flat bilaterally.  NECK: Supple. No carotid bruits.  RESPIRATORY: Clear.  CARDIOVASCULAR: Regular rate and rhythm with no obvious murmurs or rubs  noted.  EXTREMITIES: Without significant edema.  NEUROLOGIC: Cranial nerves as above. Facial asymmetry is present. The  patient has good sensation to facial pinprick and soft touch bilaterally. He  has some decrease in vibratory sensation in both legs up to the knees,  normal in the arms. The patient has tremulousness of the upper extremities.  Today, he performed finger-nose-finger with some tremor. Has some global  weakness in the legs, but is able to perform toe to finger.  The patient has  symmetric, but depressed reflexes throughout. Toes are equivocally upgoing  bilaterally. The patient has no drift.   Laboratory values are notable for a white count of 9.1, hemoglobin 10.4,  hematocrit 34.0, MCV 91.1, platelet count 154,000.  Sed rate of 85, INR 0.7,  sodium 140, potassium 3.8, chloride 113, CO2 23, glucose 238, BUN 22,   creatinine 0.8, total bilirubin 0.4, alkaline phosphatase 48, SGOT 51, SGPT  29, total protein 6.9, albumin 2.2, calcium 9.0, alkaline phosphatase 2.6.  Phenytoin level of 12.1.  Drug screen is negative. Urinalysis reveals  specific gravity of 1.025, pH 7.5, 100 mg/dl of protein, 0-2 white cells,  many bacteria.   CT of the head is as above.   IMPRESSION:  1. History of lupus.  2. Lupus cerebritis recently.  3. History of seizures with recurrence on this admission.   CT scan of the brain essentially normal.  MRI scan of the brain has been set  up. The patient is to continue with Dilantin at this point, but down the  road may consider switching the patient to Depakote to not only help with  anxiety and depression, but also to lessen the effect of osteoporosis as may  be seen with phenytoin. The patient will require steroids off and on, and  Dilantin and steroid use together may potentiate osteoporosis.   PLAN:  1. EGD study.  2. MRI scan of the brain has been set up.  3. Will follow the patient's clinical course while in house.                                               Marlan Palau, M.D.    CKW/MEDQ  D:  06/15/2003  T:  06/15/2003  Job:  [id number]   cc:   Blanch Media, M.D.  [facility]  [place], [place] [address]  Fax: [phone]   [facility]  [address], [address]

## 2010-10-02 NOTE — Procedures (Signed)
This is a portable 17-channel EEG with one channel devoted EKG utilizing  internal 10-20 lead placement system.  The patient was clinically described  as being awake and drowsy.  Electrographically it was difficult to assess  that.  The study is suboptimal in quality due to multiple artifacts chiefly  from patient muscle.  The patient has a history of lupus cerebritis.  The  study is being done to evaluate for seizures.  In the background consistent  with admixture of theta and delta activity for the most part, fairly low  amplitude without clear interhemispheric asymmetry identified.  No definite  epileptiform discharges are seen.  Otic stimulation and hyperventilation  were not performed as this was a portable study.  The EKG monitor reveals a  relatively regular rhythm with a rate of approximately 60 beats per minute.  No normal-appearing alpha background was seen throughout the crux of the  recording.  The amplitude overall was somewhat low.   ASSESSMENT:  Abnormal EEG due to generalized slowing without seizure  activity noted.  This is nonspecific as to etiology would be compatible with  toxic, anoxic, or metabolic encephalopathy or could also been seen in a  postictal state.  No definite seizure activity was seen.  No focal  abnormalities were noted.  Clinical correlation is recommended.    Tyler Deis, M.D.   HKV:[id number]  D:  31/05/2003 18:52:21  T:  31/05/2003 20:37:55  Job #:  [id number]

## 2010-10-02 NOTE — Discharge Summary (Signed)
[facility]  Patient:    Beth [name], Beth [name]                       MRN: [id number] Adm. 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  PRINCIPAL PROCEDURE: 1. Modified radical hysterectomy. 2. Pelvic lymphadenectomy. 3. Resection of a Meckels diverticulum.  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  Colposcopy-directed biopsies failed to demonstrate invasive cancer, neither did a cold knife cone.  The patient had had chest x-ray, IVP, barium enema, colonoscopy, and upper GI, all of which failed to demonstrate invasive disease; however, she did have other constitutional symptoms. She had a CT scan which showed pelvic adenopathy that was highly suspicious for advanced disease.  Please refer to the dictated H&P for the complete history.  HOSPITAL COURSE:  The patient presented on the morning of November 24, 1999 and underwent a modified radical hysterectomy with a pelvic lymphadenectomy and resection of a Meckels diverticulum under general anesthesia.  She did well postoperatively and was transferred to the PACU and then to the postoperative floor in due fashion.  Her postoperative course was essentially unremarkable. Her pain was well controlled with PC, and postop day #1 she was switched to oral medications.  She was tolerating clears.  Eventually passed gas by postop day #3.  She just had some generalized malaise which was nonspecific and found to be secondary to gastric distention.  She was given Mylicon and Dulcolax and by postop day #4 was feeling more like herself.  By postop day #4, she was tolerating a regular diet and ambulating without difficulty and not really requiring pain medication.  Postoperative H&H showed a hemoglobin of 10.9, hematocrit of  33.6, platelets of 247.  She had a single low-grade spike of 100.4 which quickly resolved without therapy.  Urinalysis was done because of generalized malaise which was also negative.  She was discharged home on postop day #4 with prescriptions for Motrin 600 mg every 6 hours p.r.n. #40 and Tylox 1-2 tabs every 4-6 hours p.r.n. #40.  She will follow up in our office the early part of next week for staple removal.  Her pathology showed no evidence of residual disease.  Lymph nodes were negative and no evidence of invasive cancer. DD:  11/28/99 TD:  11/27/99 Job: [id number]

## 2010-10-02 NOTE — Consult Note (Signed)
[employer]  Patient:    Beth [name], Beth [name]                       MRN: [id number] Proc. Date: 01/25/00 Adm. Date:  [id number] Attending:  Molpus, Carlisle Beers CC:         Douglass Rivers, M.D.  Dr. Paula Libra, [facility]   Consultation Report  REASON FOR CONSULTATION:  Asked by the emergency room physician to see Beth [name], who is a 55 year old African-American female known to me from outpatient workup over the past three months.  CHIEF COMPLAINT: 1. Nausea and vomiting. 2. Fever. 3. Abdominal pain.  HISTORY OF PRESENT ILLNESS:  Beth [name] was initially seen early June 2001 with complaints of bilateral lower quadrant abdominal pain.  She was anticipating a total abdominal hysterectomy because of a strong suspicion of squamous cell cervical cancer.  The patient had also developed new onset of constipation with occasional nausea and vomiting.  She had sustained a 12-pound weight loss by the time she was initially seen.  Physical examination was unremarkable.  Extensive evaluation included colonoscopy, which was normal, endoscopy with small bowel biopsy revealing mild Helicobacter-positive duodenitis with a normal small bowel biopsy.  This was treated with a Prevpak for 10 days before the patients intolerance forced discontinuation.  A small bowel series was normal.  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 These were subsequently confirmed with a hepatic MRI and RBC-tagged scan.  Patient underwent modified radical hypertension performed by Dr. Farrel Gobble and Dr. Stanford Breed in mid-July 2001.  The procedure was uncomplicated, and fortunately no malignancy was identified.  Ovaries were left in situ.  The patient was seen in the beginning of August approximately two weeks following her hysterectomy, 1-1/2 months since her prior GI evaluation.   She had new onset of constipation, for which Miralax was prescribed.  Further weight loss to 119 pounds, an additional nine-pound los over the six weeks was noted.  There was no complaint of abdominal pain.  The patient next returned August 22 and again August 28 because of recurrent lower quadrant pressure-type discomfort.  Weight continued to fall to 116 pounds.  A low-grade fever was noted with her most recent office visit three days ago.  She continues to complain of rectal pressure, but physical exam was normal.  A series of Hemoccults was negative.  Abdominal/pelvic CT was obtained and compared to the preoperative films.  There were two soft tissue densities which were thought to be ovaries.  Minimal free fluid in the cul-de-sac.  Laboratory included an abnormal sedimentation rate 39, and leukopenia, 2.9 thousand.  Due to the extensive and normal GI evaluation, and due to the nature of the patients symptoms and findings, she was thought to have a probable GYN infection and/or complication from her prior surgery.  A significant anxiety/depression component was suspected, and Celexa was initiated.  The patient was advised to follow up with Dr. Farrel Gobble for furhter GYN evaluation.  The patient, unfortunately, did poorly over this past weekend.  Rigor, chills, diaphoresis with fever to 102 degrees led to return to the [facility] early this morning, approximately 2 a.m.  Recurrent nausea and vomiting, including a small amount of bright red blood after protracted retching.  Evaluation in the ER reveals abnormal internal examination with vaginal discharge.  Wet prep revealed too numerous to count wbcs.  The examination  was noted to be exquisitely tender.  A repeat CT scan compared to that obtained just two weeks ago revealed no additional finding.  PHYSICAL EXAMINATION:  GENERAL:  Alert, uncomfortable, febrile, chills.  HEENT:  EOMI, PERRL, anicteric sclerae, pink  conjunctivae, no oropharyngeal lesion.  NECK:  Supple without adenopathy, thyromegaly, or bruit.  CHEST:  Clear to auscultation without adventitious sounds.  CARDIAC:  Regular rhythm, no gallop, no murmur.  ABDOMEN:  Obvious weight loss, striae.  Bowel sounds active, nondistended. Tender on deep palpation, right greater than left lower quadrant.  No rebound, no guarding, no firmness, no mass.  Midline surgical incision from TAH is well-healed.  RECTAL:  Not performed.  EXTREMITIES:  No lesion.  LABORATORY DATA:  CBC:  Normal except persistent mild leukopenia, WBC 3.4 thousand, normal hemoglobin 12.1, hematocrit at 36, normal differential including 55% neutrophils, 32% lymphocytes.  Urinalysis is normal except for a mild proteinuria, 100 mg/dl, urobilinogen 1.0.  Leukocyte esterase is negative.  Wet prep reveals too numerous to count wbcs with few clue cells. CMET abnormal for sodium 133, BUN 5, albumin 3.1, alkaline phosphatase 31. Amylase is slightly elevated 157 (27-131) with normal lipase of 40.  ASSESSMENT: 1. Probable gynecologic infection.  This is suggested by the patients vaginal    discharge with too numerous to count white cells on the wet prep, elevated    sedimentation rate, leukopenia, and painful pelvic examination.    Appendicitis needs to be considered.  I do not know if the patients    appendix was resected at the time of her prior surgeries.  Diverticulitis    would be a consideration, although there was no evidence of diverticular    disease on either colonoscopy or full-column barium enema previously    obtained.  A postsurgical complication needs to be considered. 2. A possible small Mallory-Weiss tear, suggested by history of vomiting    small amounts of bright red blood mixed with emesis.  RECOMMENDATIONS: 1. Empiric broad-spectrum antibiotic coverage administered parenterally. 2. Supportive therapy, IV fluids, analgesia, antipyretics.  3. I do not  think admission to GI service is warranted given the complete    evaluation to date. 4. Protonix 40 mg p.o./IV daily. 5. Consider ID consult. 6. Consider general surgery consult. DD:  01/25/00 TD:  01/26/00 Job: [id number]

## 2010-10-02 NOTE — Consult Note (Signed)
NAME:  WILLMA, OBANDO NO.:  [id number]   MEDICAL RECORD NO.:  [id number]                   PATIENT TYPE:  INP   LOCATION:  5709                                 FACILITY:  [facility]   PHYSICIAN:  Marlan Palau, M.D.               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  The patient has been treated with Plaquenil  prior to this admission.  Around the 15th of December 2004, the patient was  noted to have a flu-like illness with headache, stiff neck, chills.  The  patient was seen by her physician on the 20th of December 2004, was taken  off of Plaquenil, started on Augmentin and Diflucan for possible oral and  esophageal Candidiasis.  The patient was seen back on the 20th of December  with significant problems with weakness in generalized fashion, particularly  the legs, difficulty with ambulation, memory problems, confusion, ataxia,  decreased p.o. intake.  The patient was noted to have a sedimentation rate  of 104, was admitted for further evaluation.  MRI scan of the brain has been  done showing a right centrum semi ovale, a small infarct. A lumbar puncture  was performed and did not show significant inflammatory response in the  spinal fluid.  The patient had elevated IgD index.  The patient has a  positive ANA but negative double strand BNA.  The patient has been treated  with prednisone during this admission and has had some improvement.  The  patient remains confused, however.   PAST MEDICAL HISTORY:  1. 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  2. Depression.  3. Status post hysterectomy.  4. History of seizures.  5. History of Meckel's diverticulum resection in  2001.  6. History of constipation.  7. Anxiety and depression.   MEDICATIONS:  1. Prednisone 60 mg a day.  2. At this point, Plaquenil 200 mg daily.  3. Zoloft 50 mg a day.   ALLERGIES:  No known allergies.   SOCIAL HISTORY:  Does not smoke or drink.  Patient is currently unemployed.  The patient is single, has three children who are all adults.  The patient  has a two year college education.  Has done some secretarial work, clerical  work.   FAMILY MEDICAL HISTORY:  Mother is alive.  Father's history is unknown.  Patient has a sister in her 32's.  Does have a positive family history for  hypertension.   REVIEW OF SYSTEMS:  Notable for no reports of headache.  The patient does  note some visual blurring.  Denies double vision.  The patient denies  swallowing problems,  neck pain at this time.  Denies shortness of breath,  chest pain, nausea, vomiting.  Does note some dizziness.  Denies numbness or  weakness on the arms but has some weakness of the legs, difficulty with  walking.   PHYSICAL EXAMINATION:  VITAL SIGNS:  Blood pressure is 114/67, heart rate  52, respiratory rate 18, temperature afebrile.  GENERAL:  This patient is a fairly well-developed black female who is alert  with a somewhat flat affect noted at the time of examination.  HEENT:  Head-atraumatic.  Eyes-pupils equal, round and react to light.  Disks are flat.  NECK:  Supple, no carotid bruits noted.  RESPIRATORY:  Clear.  CARDIOVASCULAR:  Regular rate and rhythm, no obvious murmurs or rubs noted.  EXTREMITIES:  Without significant edema.  NEUROLOGIC:  Cranial nerves as noted above.  Facial asymmetry is present.  Patient has a good pinprick sensation bilaterally in the face.  Has full  extraocular movements.  Superior quadrantanopsia noted on the right and  double simultaneous stimulation.  The patient has no obvious aphasia, but  has slow mentation.  Patient is somewhat tremulous with upper extremities,  no  asterixis is seen.  The patient has good strength on all fours with the  exception of some trace weakness of the iliopsoas muscles bilaterally.  The  patient has fair finger-to-toe-to-finger bilaterally.  Deep tendon reflexes  are symmetric.  Toes are neutral bilaterally.  No drift is seen.  Patient  has not ambulated.  The patient is oriented to person and place, not date.  Cannot recall any of three words at two minutes.  Has inability to spell the  word oral forward or backwards.  The patient has some difficulty with  naming, has fair repetition.   LABORATORY DATA:  Notable for white count of 3.4, hemoglobin 11.9,  hematocrit 36.2, platelets 230. Sodium 143, potassium 3.9, chloride 112, CO2-  27, BUN 8, creatinine 0.6, glucose 83.  SGOT 30, SGPT 27, alkaline  phosphatase 42, total bilirubin 0.4.  Calcium 9.0.  Again, sedimentation  rate was elevated on admission over 100, positive ANA, negative anti-BNA.  ANA titer was greater than 1 to 1280.  Herpes simplex, CSF, PCR was  negative.   STUDIES:  MRI scan of the brain was as above.   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  2. Small right centrum semiovale infarction.   DISCUSSION:  This patient's clinical examination is nonfocal with exception  of a right superior quadrantanopsia which does not correlate to any  abnormalities seen by MRI scan.  The patient seems to be improving with  prednisone, suggesting that this lupus cerebritis is a cause of the above  problem.  Do need to proceed with further studies.  Lupus anticoagulant  previously has been unremarkable.  Will check an antiphospholipid antibody  panel.  Rule out hypercoagulable state and check 2D echocardiogram.  Rule  out Libman-Sacks endocarditis.   The patient likely will improve over the next several days with her  confusion.  Will follow the patient clinically while in house.                                               Marlan Palau, M.D.   CKW/MEDQ  D:   05/22/2003  T:  05/23/2003  Job:  [id number]   cc:   Leighton Roach McDiarmid, M.D.  [address] [place]  [place] [address]  Fax: [phone]   [address] [facility], [address]

## 2010-10-02 NOTE — Discharge Summary (Signed)
[facility]  Patient:    Beth [name], Beth [name]                       MRN: [id number] Adm. Date:  [id number] Disc. Date: [id number] Attending:  Jetty Duhamel T                           Discharge Summary  ADDENDUM TO REPORT [id number]:  FINAL DISCHARGE MEDICATIONS: 1. Prednisone 30 mg p.o. q.d. 2. Plaquenil 200 mg b.i.d. 3. Dilantin 100 mg b.i.d., t.i.d. every other day. 4. Aspirin 325 mg p.o. q.d. 5. Protonix 40 mg p.o. q.d. 6. Phenergan 25 mg p.r.n.  FINAL FOLLOWUP APPOINTMENTS: 1. Dr. Phylliss Bob of rheumatology appointment in one week. 2. Dr. Drue Second of family medicine appointment October 18 at 2:15 p.m. 3. Dr. Sandria Manly of neurology follow-up appointment in six weeks.  PLAN:  In consultation with Dr. Phylliss Bob of rheumatology he recommended checking antiphospholipid antibody, lupus anticoagulants, and beta-2-glycoprotein as well as a D-dimer.  These studies will be drawn prior to discharge, and we will follow up on those results.  He also recommended continuing prednisone at 30 mg q.d. 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 DD:  02/23/00 TD:  02/24/00 Job: [id number]

## 2010-10-02 NOTE — Discharge Summary (Signed)
[name], [name] NO.:  [id number]   MEDICAL RECORD NO.:  [id number]                   PATIENT TYPE:  INP   LOCATION:  5709                                 FACILITY:  [facility]   PHYSICIAN:  Ace Gins, MD                  DATE OF BIRTH:  Nov 09, 1955   DATE OF ADMISSION:  05/14/2003  DATE OF DISCHARGE:  05/25/2003                                 DISCHARGE SUMMARY   DISCHARGE DIAGNOSES:  1. Lupus cerebritis.  2. Systemic lupus erythematosus.  3. Depression.   CONSULTATIONS DURING HOSPITALIZATION:  1. Neurology.  2. Rheumatology, Dr. Phylliss Bob by telephone.  3. Palliative care.  4. Physical therapy.   PROCEDURES:  1. Lumbar puncture, May 14, 2003, showed no organisms, 8 white blood     cells, 1 red blood cell and culture was negative.  2. MRI of the brain with right periventricular subacute infarct in the white     matter.  3. MRA of the brain showed mild narrowing of the distal right vertebral     artery, possibly due to prominent right plica; otherwise normal.   DISCHARGE MEDICATIONS:  1. Plaquenil 200 mg one p.o. daily.  2. Prednisone 60 mg once a day started May 15, 2003.  Plan to continue     for two to three weeks and then taper.  3. Aspirin 325 mg one p.o. daily.  4. Calcium 1000 mg.  5. Vitamin D one p.o. b.i.d.  6. Multivitamin one p.o. daily.  7. For pain management, the patient was instructed to use Tylenol 325 one to     two tablets q.4-6h.   DIET:  Diet is to be supplemented with Ensure or other dietary shake one to  two times a day.   FOLLOW UP:  1. Dr. Phylliss Bob with [facility], January 18, at 3 p.m.  The     patient has to call Frazier Richards, [phone], prior to the visit as there     are prior unpaid visits to this clinic.  2. Dr. Franchot Mimes at [facility] February 1, at 1:45 p.m.   DISPOSITION:  The patient was sent home with [facility] and set up  with physical therapy  and occupational therapy as well as speech therapy.  Was given a 3-in-1 chair as well as a rolling walker for use at home.  Her  son decided along with her that home health versus skilled nursing facility  would be a better option for this patient and her family.  They were told  that they could change their minds if at any point care for her became too  much at home.   HOSPITAL COURSE:  Problem #1.  Beth [name] is a 55 year old African American  female with known lupus who presented to the [facility] walk-in clinic with flu-  like symptoms and loss of memory, ataxia, weakness, and  decreased p.o.  intake for the past one week.  She had a history of lupus cerebritis and  seizures in October 2001.  She was admitted on May 14, 2003 for altered  mental status.  Although her ESR was 104, infectious causes were ruled out  with lumbar puncture and she was placed on a high dose of prednisone at 60  mg daily and was noted to improve on this therapy.  MRI did show the above  results and felt not to be significant by her neuro consult as well as  negative for acute disease.  The nonfocal nature of her symptoms and lack of  findings of other than her abnormal SLE blood work lead the team to feel  that lupus cerebritis was the primary diagnosis and consulting Dr. Phylliss Bob over  the phone and it was felt that she needed to be on high dose steroids, 60 mg  p.o. daily times two to three weeks and see either Dr. Leitha Schuller to taper  the steroids and also to continue on her Plaquenil for SLE.   Problem #2.  Depression.  The patient was admitted on Zoloft secondary to  herself and family with  is the feeling this has possibly something to do  with her altered mental status.  Zoloft was discontinued during hospital  stay.  She actually only received two doses.   Problem #3.  Increased LFTs.  The patient on admission had slightly  increased liver function tests but they did return to normal during   hospitalization.  It was felt that this was secondary to SLE or shock liver  picture.  Her hepatitis panel was negative.   Problem #4.  Secondary to the patient being on high dose steroids, she was  also started on calcium and vitamin D and it was felt she will possibly need  Fosamax in the future as well as an outpatient bone mineral density scan to  be scheduled by the patient's physician, Dr. Leitha Schuller.   LABORATORY DATA:  On May 22, 2003, white blood count 2.8, hemoglobin  11.8, platelets 261.  Sodium 141, potassium 4, chloride 112, CO2 24, BUN 9,  creatinine 0.7, glucose 99, LFTs all within normal limits.  Albumin  decreased to 2.4.  She was lupus anticoagulant negative, anti-DNA negative,  ANA positive with a titer of 1:1280.  On May 22, 2003 her ESR did  decrease to 60.  At the time of discharge, her anti-phospholipid antibodies  were still pending.   DISCHARGE PLAN:  Discharge plans for May 25, 2003, the patient in stable  condition with small increases in function from admission.  She still has a  very flat affect, very quiet, but slow response time to any questions,  although she does seem to be accurate with her memory.  She has difficulty  accessing memory and  words.  Her muscular strength is globally weak with  4/5 strength.  It is difficult for physical therapy to get her out of bed.  She does go to the bathroom with assistance.                                                Ace Gins, MD    JS/MEDQ  D:  05/24/2003  T:  05/25/2003  Job:  [id number]

## 2010-10-02 NOTE — Assessment & Plan Note (Signed)
Beth [name]                           Beth [name]   Beth [name], Beth [name]                       MRN:          [id number]  DATE:11/30/2005                            DOB:          03/17/1956    I would appreciate your assistance with Beth [name] who has rectal and  pelvic pain.  I have seen her for this problem on one occasion.  She was  previously followed by Dr. Kinnie Scales and had a full GI work-up.  I am enclosing  my progress [name].  I just received a letter from Beth [name] where he saw  her several weeks ago for similar pain and suspecting possible peripheral  neuropathy or sacral neuropathy.  Her symptoms of rectal pain started within  weeks after being discharged from a three-month prolonged hospitalization  after lupus exacerbation complicated by cerebritis.  She was completely  deconditioned and unable to ambulate and had decubital ulcer in her gluteal  area.  After that she developed gradual onset of rectal or coccygeal pain  which occurs shortly after she sits down.  I feel that this is not a primary  GI or rectal problem, that we are dealing with some sort of neurological  problem in the coccygeal area and I ask if it is possible to try diagnostic  or possible therapeutic block that would relieve her of the pain.  The pain  and numbness as well as heaviness goes to both of her thighs as well.  I  have put her on laxatives which seem to help with the bowel habits, but do  not alleviate the pain.  I appreciate your assistance with this nice lady.                                   Hedwig Morton. [name], MD   DMB/MedQ  DD:  11/30/2005  DT:  12/01/2005  Job #:  [id number]   cc:   Stefani Dama, MD

## 2010-10-02 NOTE — Consult Note (Signed)
[facility]  Patient:    Beth [name], Beth [name]                       MRN: [id number] Proc. Date: 02/19/00 Adm. Date:  [id number] Attending:  Jetty Duhamel T                          Consultation Report  ADDRESS:  [address] [place] [address].  DATE OF BIRTH:  07/26/1955.  REASON FOR CONSULTATION:   This 55 year old right-handed black single female is seen at the request of Rosanne Sack, M.D., for evaluation of a seizures.  HISTORY OF PRESENT ILLNESS:  [name] is a 55 year old lady with a history of facial swelling and rash diagnosed in September 2001 at [facility] with an inpatient evaluation as systemic lupus erythematosus.  At that time, she exhibited some paranoia and was discharged on a tapering course of prednisone.  She had not felt well for many years.  She came to the emergency room because she did not feel well on February 18, 2000, and in the early morning hours of February 19, 2000, had a generalized major motor seizure.  She has the diagnosis of lupus erythematous diagnosed January 17, 2000, at [facility] with estimated sedimentation rate of 40, complement C3 of 31, complement C4 of 10, CH50 of 1, RA factor of 50, RPR nonreactive, positive ANA, positive anti-DNA with a titer of 10 and a Smith antibody of 436.  The ANCA study was negative.  She has had visual hallucinations, seeing children under trees, etc.  She has exhibited some paranoia.  Her medications at the time she was discharged from [facility] approximately three weeks ago were prednisone on tapering scale of 25 mg per day, Plaquenil 200 mg b.i.d., Protonix 40 mg q.d., and Phenergan 25 mg p.r.n.   She has had a tremor and has had visual hallucinations.  She denies any history of trauma, positive family history of seizures, or known drug abuse.  In the hospital, a drug screen has been negative.  A CT scan and EEG have been  unremarkable, and CBC, arterial blood gases, and comprehensive metabolic panel had been normal.  PHYSICAL EXAMINATION:  GENERAL:  A well-developed black female.  VITAL SIGNS:  Blood pressure lying in the right and left arm of 120/60, a heart rate of 88.  Her temperature was 98 degrees.  NECK:  There were no bruits.  The neck was supple.  NEUROLOGIC:  Mental status:  She was alert and oriented x 3.  Cranial nerve exam revealed visual fields full, discs flat, extraocular movements full, corneals present.  No seventh nerve palsy present.  Hearing intact, air conduction greater than bone conduction.  Tongue:  Midline uvula.  Gags present.  Tremor in her voice.  Sternocleidomastoid and trapezius testing normal.  Motor examination:  Good strength upper and lower extremities with of her hands.  Tremor in her hands.  Sensory examination intact.  Deep tendon reflexes 1-2+, and plantar responses downgoing.  IMPRESSION: 1. Seizures, code 345.10. 2. Paranoia, code 297.9. 3. Visual hallucinations, code 368.16. 4. Tremor, code 323.1. 5. Lupus erythematous.  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 DD:  02/19/00 TD:  02/20/00 Job: [id number]

## 2010-10-02 NOTE — Discharge Summary (Signed)
[facility]  Patient:    Beth [name], Beth [name]                       MRN: [id number] Adm. Date:  [id number] Disc. Date: [id number] Attending:  Jetty Duhamel T CC:         Dalbert Mayotte, M.D., of Family  Medicine  Eddie North, M.D., of Rheumatology   Discharge Summary  DISCHARGE DIAGNOSES: 1. 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 2. Systemic lupus erythematosus diagnosed September 9, treated with Plaquenil    and a prednisone taper.  OTHER DIAGNOSES: 1. 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 2. 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 3. Seasonal allergies. 4. Weight loss of approximately 20 pounds. 5. Hand tremor, intentional.  CONDITION ON DISCHARGE:  Good.  FOLLOW-UP:  Appointments with Dr. Drue Second on October 18 at 2:15 p.m., Dr. Phylliss Bob to be scheduled.  DISCHARGE MEDICATIONS: 1. Dilantin 100 mg b.i.d., t.i.d. q.o.d. 2. Plaquenil 200 mg b.i.d. 3. Phenergan 25 p.r.n.  q.6h. 4. Prednisone 20 mg p.o. q.d. x 5 days, then 10 mg p.o. q.d. x 10 days, then    5 mg p.o. q.d. 5. Protonix 40 mg p.o. q.d.  INSTRUCTIONS:  Patient was advised to avoid driving or operating heavy machinery.  ACTIVITY:  As tolerated, otherwise.  REASON FOR ADMISSION:  Patient is a 55 year old African-American female with the above-mentioned medical problems who was diagnosed with systemic lupus erythematosus during a September 2001 [place] admission.  She had initially presented with facial swelling and rash as well as paranoia.  She was discharged on a prednisone taper and Plaquenil and represented on October 4 with a new onset seizure.  HOSPITAL COURSE: #1 - GENERALIZED MAJOR MOTOR SEIZURE PRESUMED SECONDARY TO LUPUS CEREBRITIS: Dr. Sandria Manly of neurology was consulted.  The patient was loaded with Dilantin and was ultimately discharged on a 200 mg/300 mg q.o.d. regimen with a  discharge Dilantin level of 20.  She had no recurrent seizures in the hospital and reported resolution of visual and auditory hallucinations.  She had no other neurologic deficits.  An MRI was performed on October 6 with no evidence of infarct, mass, or bleed.  However, it did reveal minimal nonspecific punctate hyperdensity in the left frontal lobe.  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  There was no evidence of superior sagittal sinus thrombosis.  A lumbar puncture was performed.  CSF revealed an elevated protein of 272 with IgG at 85.4, white blood cells of 4.  Oligoclonal bands were pending.  For concerns of Libman-Sacks endocarditis, an echocardiogram was also performed which was essentially normal revealing no valvular abnormalities and was unchanged from a September study ejection fraction estimated at 55 to 65%.  #2 - SYSTEMIC LUPUS ERYTHEMATOSUS:  The patient endorsed photosensitivity as well as a facial rash which had resolved prior to my assessment.  She denied any joint pains or urinary symptoms, family history of autoimmune disorders. She denied any oral ulcers, shortness of breath, or chest pain.  Her laboratory studies revealed low complement levels of C3 of 31, C4 of less than 10, CH50 of 1, elevated ENA at 217, rhibonucleic protein at 350, ______ antibody 436, SSA 44, SSB 18, scleroderma antibody at 18.  Urinalysis was normal with no evidence of active sediment.  Rheumatic fever 81, RPR nonreactive.  ANA was positive, ratio of 40,  with a speckle pattern.  Double stranded DNA was positive.  CMV titer 0.6.  CSF studies as noted above. Patient also had a negative viral hepatitis screen, negative HIV test, normal TSH of 0.6, and a hemoglobin electrophoresis which was normal.  The patients presenting problems including the rash and seizure were essentially resolved and she was discharged on Plaquenil and a prednisone  taper as noted above with plan for follow-up with Dr. Phylliss Bob in one to two weeks time.  PROCEDURES:  MRI, MRA, lumbar puncture.  CONSULTATIONS:  Neurology, Dr. Sandria Manly, details noted above. DD:  02/23/00 TD:  02/24/00 Job: [id number]

## 2011-03-11 ENCOUNTER — Other Ambulatory Visit: Payer: Self-pay | Admitting: Internal Medicine

## 2011-03-11 DIAGNOSIS — Z1231 Encounter for screening mammogram for malignant neoplasm of breast: Secondary | ICD-10-CM

## 2011-04-21 ENCOUNTER — Ambulatory Visit
Admission: RE | Admit: 2011-04-21 | Discharge: 2011-04-21 | Disposition: A | Discharge status: Home / Self Care | Payer: BC Managed Care – PPO | Source: Ambulatory Visit | Attending: Internal Medicine | Admitting: Internal Medicine

## 2011-04-21 DIAGNOSIS — Z1231 Encounter for screening mammogram for malignant neoplasm of breast: Secondary | ICD-10-CM

## 2012-03-02 ENCOUNTER — Encounter (HOSPITAL_COMMUNITY): Payer: Self-pay

## 2012-03-02 ENCOUNTER — Emergency Department (HOSPITAL_COMMUNITY)
Admission: EM | Admit: 2012-03-02 | Discharge: 2012-03-02 | Disposition: A | Discharge status: Home / Self Care | Payer: No Typology Code available for payment source | Attending: Emergency Medicine | Admitting: Emergency Medicine

## 2012-03-02 DIAGNOSIS — M542 Cervicalgia: Secondary | ICD-10-CM | POA: Insufficient documentation

## 2012-03-02 DIAGNOSIS — S161XXA Strain of muscle, fascia and tendon at neck level, initial encounter: Secondary | ICD-10-CM

## 2012-03-02 DIAGNOSIS — R51 Headache: Secondary | ICD-10-CM | POA: Insufficient documentation

## 2012-03-02 DIAGNOSIS — S139XXA Sprain of joints and ligaments of unspecified parts of neck, initial encounter: Secondary | ICD-10-CM | POA: Insufficient documentation

## 2012-03-02 DIAGNOSIS — M329 Systemic lupus erythematosus, unspecified: Secondary | ICD-10-CM | POA: Insufficient documentation

## 2012-03-02 HISTORY — DX: Systemic lupus erythematosus, unspecified: M32.9

## 2012-03-02 MED ORDER — METHOCARBAMOL 500 MG PO TABS: 1000.0000 mg | ORAL_TABLET | Freq: Four times a day (QID) | ORAL | Status: DC

## 2012-03-02 MED ORDER — ACETAMINOPHEN 325 MG PO TABS
650.0000 mg | ORAL_TABLET | Freq: Once | ORAL | Status: AC
  Administered 2012-03-02: 650 mg via ORAL
  Filled 2012-03-02: qty 2

## 2012-03-02 MED ORDER — IBUPROFEN 600 MG PO TABS: 600.0000 mg | ORAL_TABLET | Freq: Four times a day (QID) | ORAL | Status: DC | PRN
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
## 2012-03-02 NOTE — ED Notes (Signed)
Pt verbalizes understanding 

## 2012-03-04 NOTE — ED Provider Notes (Signed)
Medical screening examination/treatment/procedure(s) were performed by non-physician practitioner and as supervising physician I was immediately available for consultation/collaboration.   Hurman Horn, MD 03/04/12 [phone]

## 2012-05-02 ENCOUNTER — Other Ambulatory Visit: Payer: Self-pay | Admitting: Internal Medicine

## 2012-05-02 DIAGNOSIS — Z1231 Encounter for screening mammogram for malignant neoplasm of breast: Secondary | ICD-10-CM

## 2012-05-23 ENCOUNTER — Ambulatory Visit
Admission: RE | Admit: 2012-05-23 | Discharge: 2012-05-23 | Disposition: A | Discharge status: Home / Self Care | Payer: BC Managed Care – PPO | Source: Ambulatory Visit | Attending: Internal Medicine | Admitting: Internal Medicine

## 2012-05-23 DIAGNOSIS — Z1231 Encounter for screening mammogram for malignant neoplasm of breast: Secondary | ICD-10-CM

## 2012-11-15 LAB — HM MAMMOGRAPHY: HM Mammogram: NEGATIVE

## 2013-04-23 ENCOUNTER — Other Ambulatory Visit: Payer: Self-pay

## 2013-04-23 DIAGNOSIS — Z1231 Encounter for screening mammogram for malignant neoplasm of breast: Secondary | ICD-10-CM

## 2013-05-03 ENCOUNTER — Other Ambulatory Visit: Payer: Self-pay | Admitting: Internal Medicine

## 2013-05-18 ENCOUNTER — Encounter: Payer: Self-pay | Admitting: Physician Assistant

## 2013-05-18 DIAGNOSIS — E559 Vitamin D deficiency, unspecified: Secondary | ICD-10-CM | POA: Insufficient documentation

## 2013-05-18 DIAGNOSIS — R7309 Other abnormal glucose: Secondary | ICD-10-CM | POA: Insufficient documentation

## 2013-05-18 DIAGNOSIS — R7303 Prediabetes: Secondary | ICD-10-CM

## 2013-05-18 DIAGNOSIS — E785 Hyperlipidemia, unspecified: Secondary | ICD-10-CM | POA: Insufficient documentation

## 2013-05-18 DIAGNOSIS — I10 Essential (primary) hypertension: Secondary | ICD-10-CM

## 2013-05-23 ENCOUNTER — Encounter: Payer: Self-pay | Admitting: Physician Assistant

## 2013-05-23 ENCOUNTER — Ambulatory Visit (INDEPENDENT_AMBULATORY_CARE_PROVIDER_SITE_OTHER): Payer: BC Managed Care – PPO | Admitting: Physician Assistant

## 2013-05-23 VITALS — BP 118/78 | HR 72 | Temp 97.7°F | Resp 16 | Ht 61.0 in | Wt 178.0 lb

## 2013-05-23 DIAGNOSIS — R5381 Other malaise: Secondary | ICD-10-CM

## 2013-05-23 DIAGNOSIS — E559 Vitamin D deficiency, unspecified: Secondary | ICD-10-CM

## 2013-05-23 DIAGNOSIS — F329 Major depressive disorder, single episode, unspecified: Secondary | ICD-10-CM

## 2013-05-23 DIAGNOSIS — I10 Essential (primary) hypertension: Secondary | ICD-10-CM

## 2013-05-23 DIAGNOSIS — N3000 Acute cystitis without hematuria: Secondary | ICD-10-CM

## 2013-05-23 DIAGNOSIS — F32A Depression, unspecified: Secondary | ICD-10-CM

## 2013-05-23 DIAGNOSIS — Z79899 Other long term (current) drug therapy: Secondary | ICD-10-CM

## 2013-05-23 DIAGNOSIS — R220 Localized swelling, mass and lump, head: Secondary | ICD-10-CM

## 2013-05-23 DIAGNOSIS — Z0000 Encounter for general adult medical examination without abnormal findings: Secondary | ICD-10-CM

## 2013-05-23 DIAGNOSIS — Z23 Encounter for immunization: Secondary | ICD-10-CM

## 2013-05-23 DIAGNOSIS — R5383 Other fatigue: Secondary | ICD-10-CM

## 2013-05-23 LAB — CBC WITH DIFFERENTIAL/PLATELET
Basophils Absolute: 0 10*3/uL (ref 0.0–0.1)
Basophils Relative: 0 % (ref 0–1)
Eosinophils Absolute: 0.1 10*3/uL (ref 0.0–0.7)
Eosinophils Relative: 2 % (ref 0–5)
HCT: 41 % (ref 36.0–46.0)
Hemoglobin: 13.5 g/dL (ref 12.0–15.0)
Lymphocytes Relative: 50 % — ABNORMAL HIGH (ref 12–46)
Lymphs Abs: 2.1 10*3/uL (ref 0.7–4.0)
MCH: 30 pg (ref 26.0–34.0)
MCHC: 32.9 g/dL (ref 30.0–36.0)
MCV: 91.1 fL (ref 78.0–100.0)
Monocytes Absolute: 0.4 10*3/uL (ref 0.1–1.0)
Monocytes Relative: 9 % (ref 3–12)
Neutro Abs: 1.7 10*3/uL (ref 1.7–7.7)
Neutrophils Relative %: 39 % — ABNORMAL LOW (ref 43–77)
Platelets: 275 10*3/uL (ref 150–400)
RBC: 4.5 MIL/uL (ref 3.87–5.11)
RDW: 13.9 % (ref 11.5–15.5)
WBC: 4.2 10*3/uL (ref 4.0–10.5)

## 2013-05-23 LAB — HEMOGLOBIN A1C
Hgb A1c MFr Bld: 6.1 % — ABNORMAL HIGH (ref <5.7)
Mean Plasma Glucose: 128 mg/dL — ABNORMAL HIGH (ref <117)

## 2013-05-23 MED ORDER — BUPROPION HCL ER (XL) 150 MG PO TB24: 150.0000 mg | ORAL_TABLET | ORAL | Status: DC

## 2013-05-23 NOTE — Progress Notes (Signed)
Complete Physical HPI Patient presents for complete physical.   Patient's blood pressure has been controlled at home. Patient denies chest pain, shortness of breath, dizziness. BP: 118/78 mmHg  Patient's cholesterol is diet controlled. The patient's cholesterol last visit was LDL 134, HDL 35,  The patient has been working on diet and exercise for prediabetes, denies changes in vision, polys, and paresthesias. Last A1C in office was A1C 5.8.  Vitamin D 61. Patient states she has been crying more often, she has had some hot flashes, and she has more depression every winter.   Current Medications:  Current Outpatient Prescriptions on File Prior to Visit  Medication Sig Dispense Refill  . ALPRAZolam (XANAX) 0.5 MG tablet TAKE 1/2 TO 1 TABLET THREE TIMES A DAY AS NEEDED FOR ANXIETY  90 tablet  0  . Cholecalciferol (VITAMIN D) 2000 UNITS CAPS Take 1 capsule by mouth daily.      . fish oil-omega-3 fatty acids 1000 MG capsule Take 1 g by mouth daily.      . Flaxseed, Linseed, (FLAXSEED OIL PO) Take 1,000 mg by mouth daily.       . hydroxychloroquine (PLAQUENIL) 200 MG tablet Take 200 mg by mouth daily.       No current facility-administered medications on file prior to visit.   Health Maintenance:  Tetanus: 2005  Pneumovax: declines Flu vaccine: declines Zostavax: N/A Pap: 2014 neg MGM: 05/23/2012- very dense breast- neg DEXA: 2007 normal Colonoscopy: Dr. Earlean Shawl 2005 due 10 years EGD:N/A Allergies:  Allergies  Allergen Reactions  . Bactrim [Sulfamethoxazole-Tmp Ds]     GI upset  . Cymbalta [Duloxetine Hcl]     Sweating   Medical History:  Past Medical History  Diagnosis Date  . Lupus   . Arthritis   . Depression   . Hypertension     Labile  . Cystocele   . Prediabetes   . Seizure   . Vitamin D deficiency   . Hyperlipidemia    Surgical History:  Past Surgical History  Procedure Laterality Date  . Abdominal hysterectomy    . Cesarean section     Family History:   Family History  Problem Relation Age of Onset  . Hyperlipidemia Mother   . Hypertension Mother   . Heart disease Mother    Social History:  History   Social History  . Marital Status: Single    Spouse Name: N/A    Number of Children: 3 kids 4 grand daughter  . Years of Education: N/   Occupational History  . [employer]   Social History Main Topics  . Smoking status: Never Smoker   . Smokeless tobacco: Never Used  . Alcohol Use: No  . Drug Use: No  . Sexual Activity: No   Other Topics Concern  . Not on file   Social History Narrative  . No narrative on file   ROS Constitutional:+ snoring, fatigue  Denies weight loss/gain, headaches, insomnia, night sweats, and change in appetite. Eyes: ([place]) DEE normal 2013 Denies redness, blurred vision, diplopia, discharge, itchy, watery eyes.  ENT: Denies discharge, congestion, post nasal drip, sore throat, earache, hearing loss, dental pain, Tinnitus, Vertigo, Sinus pain, snoring.  Cardio: Denies chest pain, palpitations, irregular heartbeat, dyspnea, diaphoresis, orthopnea, PND, claudication, edema Respiratory: denies cough, dyspnea, pleurisy, hoarseness, wheezing.  Gastrointestinal: Denies dysphagia, heartburn, pain, cramps, nausea, vomiting, bloating, diarrhea, constipation, hematemesis, melena, hematochezia, hemorrhoids Genitourinary: Daleen Snook) + stress incontinence for 2 months with burning one month Denies dysuria, frequency, urgency, nocturia,  hesitancy, discharge, hematuria, flank pain Breast: Denies Breast lumps, nipple discharge, bleeding.  Musculoskeletal: Denies arthralgia, myalgia, stiffness, Jt. Swelling, pain, Skin: Denies pruritis, rash, hives,  acne, eczema, changing in skin lesion Neuro: (Dr. Ellene Route) Denies Weakness, tremor, incoordination, spasms, paresthesia, pain Psychiatric: Denies confusion, memory loss, sensory loss Endocrine: (Dr. Elpidio Galea) Denies change in weight, skin, hair change, nocturia, and  paresthesia, Diabetic Denies Polys, visual blurring, hyper /hypo glycemic episodes.  Heme/Lymph: Denies Excessive bleeding, bruising, enlarged lymph nodes  Physical Exam: Estimated body mass index is 33.65 kg/(m^2) as calculated from the following:   Height as of this encounter: 5\' 1"  (1.549 m).   Weight as of this encounter: 178 lb (80.74 kg). Filed Vitals:   05/23/13 1402  BP: 118/78  Pulse: 72  Temp: 97.7 F (36.5 C)  Resp: 16   General Appearance: Well nourished, in no apparent distress. Eyes: PERRLA, EOMs, conjunctiva no swelling or erythema, normal fundi and vessels. Sinuses: No Frontal/maxillary tenderness ENT/Mouth: Ext aud canals clear, normal light reflex with TMs without erythema, bulging.  Good dentition. No erythema, swelling, or exudate on post pharynx. Tonsils not swollen or erythematous. Hearing normal. Has small soft nodule on left tip of tongue for last 5-6 years, tender occ, getting larger Neck: Supple, thyroid normal. No bruits Respiratory: Respiratory effort normal, BS equal bilaterally without rales, rhonchi, wheezing or stridor. Cardio: RRR without murmurs, rubs or gallops. Brisk peripheral pulses without edema.  Chest: symmetric, with normal excursions and percussion. Breasts: defer Abdomen: Soft, obese +BS. Non tender, no guarding, rebound, hernias, masses, or organomegaly. .  Lymphatics: Non tender without lymphadenopathy.  Genitourinary: defer Musculoskeletal: Full ROM all peripheral extremities,5/5 strength, and normal gait. Skin: Warm, dry without rashes, lesions, ecchymosis.  Neuro: Cranial nerves intact, reflexes equal bilaterally. Normal muscle tone, no cerebellar symptoms. Sensation intact.  Psych: Awake and oriented X 3, normal affect, Insight and Judgment appropriate.   EKG: WNL no changes.  Assessment and Plan: Lupus- continue to follow up  Arthritis- controlled  Depression/SAD/Menopause- check FSH, add wellbutrin 150 for SAD  Hypertension-  controlled  Cystocele- follow up Tanya  Prediabetes- check A1C  Seizure- has not had in a long time  Vitamin D deficiency- check level  Hyperlipidemia- check lipids  TDAP this visit Colonoscopy due this year  Tongue nodule/cyst- send ENT Snoring, crowded mouth, fatigue- get sleep study Constipation- benefiber, walk, water Stress incontinence/dyria- given bladder matters book, ? Cyctcele, check for UTI  Discussed med's effects and SE's. Screening labs and tests as requested with regular follow-up as recommended.   Vicie Mutters 2:11 PM

## 2013-05-23 NOTE — Patient Instructions (Addendum)
Add BENEFIBER 1-2 TBSP for chol, constipation, and weight loss  Sleep Apnea  Sleep apnea is a sleep disorder characterized by abnormal pauses in breathing while you sleep. When your breathing pauses, the level of oxygen in your blood decreases. This causes you to move out of deep sleep and into light sleep. As a result, your quality of sleep is poor, and the system that carries your blood throughout your body (cardiovascular system) experiences stress. If sleep apnea remains untreated, the following conditions can develop:  High blood pressure (hypertension).  Coronary artery disease.  Inability to achieve or maintain an erection (impotence).  Impairment of your thought process (cognitive dysfunction). There are three types of sleep apnea: 1. Obstructive sleep apnea Pauses in breathing during sleep because of a blocked airway. 2. Central sleep apnea Pauses in breathing during sleep because the area of the brain that controls your breathing does not send the correct signals to the muscles that control breathing. 3. Mixed sleep apnea A combination of both obstructive and central sleep apnea. RISK FACTORS The following risk factors can increase your risk of developing sleep apnea:  Being overweight.  Smoking.  Having narrow passages in your nose and throat.  Being of older age.  Being female.  Alcohol use.  Sedative and tranquilizer use.  Ethnicity. Among individuals younger than 35 years, African Americans are at increased risk of sleep apnea. SYMPTOMS   Difficulty staying asleep.  Daytime sleepiness and fatigue.  Loss of energy.  Irritability.  Loud, heavy snoring.  Morning headaches.  Trouble concentrating.  Forgetfulness.  Decreased interest in sex. DIAGNOSIS  In order to diagnose sleep apnea, your caregiver will perform a physical examination. Your caregiver may suggest that you take a home sleep test. Your caregiver may also recommend that you spend the night in  a sleep lab. In the sleep lab, several monitors record information about your heart, lungs, and brain while you sleep. Your leg and arm movements and blood oxygen level are also recorded. TREATMENT The following actions may help to resolve mild sleep apnea:  Sleeping on your side.   Using a decongestant if you have nasal congestion.   Avoiding the use of depressants, including alcohol, sedatives, and narcotics.   Losing weight and modifying your diet if you are overweight. There also are devices and treatments to help open your airway:  Oral appliances. These are custom-made mouthpieces that shift your lower jaw forward and slightly open your bite. This opens your airway.  Devices that create positive airway pressure. This positive pressure "splints" your airway open to help you breathe better during sleep. The following devices create positive airway pressure:  Continuous positive airway pressure (CPAP) device. The CPAP device creates a continuous level of air pressure with an air pump. The air is delivered to your airway through a mask while you sleep. This continuous pressure keeps your airway open.  Nasal expiratory positive airway pressure (EPAP) device. The EPAP device creates positive air pressure as you exhale. The device consists of single-use valves, which are inserted into each nostril and held in place by adhesive. The valves create very little resistance when you inhale but create much more resistance when you exhale. That increased resistance creates the positive airway pressure. This positive pressure while you exhale keeps your airway open, making it easier to breath when you inhale again.  Bilevel positive airway pressure (BPAP) device. The BPAP device is used mainly in patients with central sleep apnea. This device is similar to the  CPAP device because it also uses an air pump to deliver continuous air pressure through a mask. However, with the BPAP machine, the pressure is  set at two different levels. The pressure when you exhale is lower than the pressure when you inhale.  Surgery. Typically, surgery is only done if you cannot comply with less invasive treatments or if the less invasive treatments do not improve your condition. Surgery involves removing excess tissue in your airway to create a wider passage way. Document Released: 04/23/2002 Document Revised: 08/28/2012 Document Reviewed: 09/09/2011 [facility] Patient Information 2014 [place].   Incontinence- bladder matter book, talk with tanya about cystocele vs vaginal atrophy Depression- menopause verus sleep apnea- add wellbutrin Oral surgeon for the tongue    Bad carbs also include fruit juice, alcohol, and sweet tea. These are empty calories that do not signal to your brain that you are full.   Please remember the good carbs are still carbs which convert into sugar. So please measure them out no more than 1/2-1 cup of rice, oatmeal, pasta, and beans.  Veggies are however free foods! Pile them on.   I like lean protein at every meal such as chicken, [place], pork chops, cottage cheese, etc. Just do not fry these meats and please center your meal around vegetable, the meats should be a side dish.   No all fruit is created equal. Please see the list below, the fruit at the bottom is higher in sugars than the fruit at the top

## 2013-05-24 LAB — BASIC METABOLIC PANEL WITH GFR
BUN: 9 mg/dL (ref 6–23)
CO2: 27 mEq/L (ref 19–32)
Calcium: 9.1 mg/dL (ref 8.4–10.5)
Chloride: 103 mEq/L (ref 96–112)
Creat: 0.64 mg/dL (ref 0.50–1.10)
GFR, Est African American: >89 mL/min
GFR, Est Non African American: >89 mL/min
Glucose, Bld: 74 mg/dL (ref 70–99)
Potassium: 4.1 mEq/L (ref 3.5–5.3)
Sodium: 137 mEq/L (ref 135–145)

## 2013-05-24 LAB — LIPID PANEL
Cholesterol: 186 mg/dL (ref 0–200)
HDL: 38 mg/dL — ABNORMAL LOW (ref >39)
LDL Cholesterol: 122 mg/dL — ABNORMAL HIGH (ref 0–99)
Total CHOL/HDL Ratio: 4.9 Ratio
Triglycerides: 128 mg/dL (ref <150)
VLDL: 26 mg/dL (ref 0–40)

## 2013-05-24 LAB — HEPATIC FUNCTION PANEL
ALT: 12 U/L (ref 0–35)
AST: 16 U/L (ref 0–37)
Albumin: 4.2 g/dL (ref 3.5–5.2)
Alkaline Phosphatase: 44 U/L (ref 39–117)
Bilirubin, Direct: 0.1 mg/dL (ref 0.0–0.3)
Indirect Bilirubin: 0.3 mg/dL (ref 0.0–0.9)
Total Bilirubin: 0.4 mg/dL (ref 0.3–1.2)
Total Protein: 7.5 g/dL (ref 6.0–8.3)

## 2013-05-24 LAB — VITAMIN D 25 HYDROXY (VIT D DEFICIENCY, FRACTURES): Vit D, 25-Hydroxy: 68 ng/mL (ref 30–89)

## 2013-05-24 LAB — FERRITIN: Ferritin: 226 ng/mL (ref 10–291)

## 2013-05-24 LAB — URINALYSIS, ROUTINE W REFLEX MICROSCOPIC
Bilirubin Urine: NEGATIVE
Glucose, UA: NEGATIVE mg/dL
Hgb urine dipstick: NEGATIVE
Ketones, ur: NEGATIVE mg/dL
Leukocytes, UA: NEGATIVE
Nitrite: POSITIVE — AB
Protein, ur: NEGATIVE mg/dL
Specific Gravity, Urine: 1.02 (ref 1.005–1.030)
Urobilinogen, UA: 0.2 mg/dL (ref 0.0–1.0)
pH: 5 (ref 5.0–8.0)

## 2013-05-24 LAB — URINALYSIS, MICROSCOPIC ONLY
Casts: NONE SEEN
Crystals: NONE SEEN

## 2013-05-24 LAB — MICROALBUMIN / CREATININE URINE RATIO
Creatinine, Urine: 207.1 mg/dL
Microalb Creat Ratio: 6 mg/g (ref 0.0–30.0)
Microalb, Ur: 1.24 mg/dL (ref 0.00–1.89)

## 2013-05-24 LAB — VITAMIN B12: Vitamin B-12: 722 pg/mL (ref 211–911)

## 2013-05-24 LAB — IRON AND TIBC
%SAT: 24 % (ref 20–55)
Iron: 75 ug/dL (ref 42–145)
TIBC: 311 ug/dL (ref 250–470)
UIBC: 236 ug/dL (ref 125–400)

## 2013-05-24 LAB — TSH: TSH: 1.128 u[IU]/mL (ref 0.350–4.500)

## 2013-05-24 LAB — FOLLICLE STIMULATING HORMONE: FSH: 41.1 m[IU]/mL

## 2013-05-24 LAB — MAGNESIUM: Magnesium: 1.8 mg/dL (ref 1.5–2.5)

## 2013-05-25 LAB — URINE CULTURE: Colony Count: >=100000

## 2013-05-25 MED ORDER — CIPROFLOXACIN HCL 500 MG PO TABS: 500.0000 mg | ORAL_TABLET | Freq: Two times a day (BID) | ORAL | Status: AC

## 2013-05-25 NOTE — Addendum Note (Signed)
Addended by: Vicie Mutters R on: 05/25/2013 01:38 PM   Modules accepted: Orders

## 2013-05-30 ENCOUNTER — Ambulatory Visit
Admission: RE | Admit: 2013-05-30 | Discharge: 2013-05-30 | Disposition: A | Discharge status: Home / Self Care | Payer: BC Managed Care – PPO | Source: Ambulatory Visit

## 2013-05-30 DIAGNOSIS — Z1231 Encounter for screening mammogram for malignant neoplasm of breast: Secondary | ICD-10-CM

## 2013-06-15 ENCOUNTER — Telehealth: Payer: Self-pay | Admitting: Physician Assistant

## 2013-06-15 MED ORDER — ALPRAZOLAM 0.5 MG PO TABS: ORAL_TABLET | ORAL | Status: DC

## 2013-06-20 ENCOUNTER — Ambulatory Visit (INDEPENDENT_AMBULATORY_CARE_PROVIDER_SITE_OTHER): Payer: BC Managed Care – PPO | Admitting: Physician Assistant

## 2013-06-20 VITALS — BP 110/62 | HR 68 | Temp 97.7°F | Resp 16 | Ht 61.0 in | Wt 178.0 lb

## 2013-06-20 DIAGNOSIS — K623 Rectal prolapse: Secondary | ICD-10-CM

## 2013-06-20 DIAGNOSIS — F32A Depression, unspecified: Secondary | ICD-10-CM

## 2013-06-20 DIAGNOSIS — N3000 Acute cystitis without hematuria: Secondary | ICD-10-CM

## 2013-06-20 DIAGNOSIS — F329 Major depressive disorder, single episode, unspecified: Secondary | ICD-10-CM

## 2013-06-20 DIAGNOSIS — I10 Essential (primary) hypertension: Secondary | ICD-10-CM

## 2013-06-20 DIAGNOSIS — E669 Obesity, unspecified: Secondary | ICD-10-CM

## 2013-06-20 LAB — HEPATIC FUNCTION PANEL
ALT: 15 U/L (ref 0–35)
AST: 15 U/L (ref 0–37)
Albumin: 4.2 g/dL (ref 3.5–5.2)
Alkaline Phosphatase: 45 U/L (ref 39–117)
Bilirubin, Direct: 0.1 mg/dL (ref 0.0–0.3)
Indirect Bilirubin: 0.3 mg/dL (ref 0.2–1.2)
Total Bilirubin: 0.4 mg/dL (ref 0.2–1.2)
Total Protein: 7.4 g/dL (ref 6.0–8.3)

## 2013-06-20 LAB — CBC WITH DIFFERENTIAL/PLATELET
Basophils Absolute: 0 10*3/uL (ref 0.0–0.1)
Basophils Relative: 0 % (ref 0–1)
Eosinophils Absolute: 0.1 10*3/uL (ref 0.0–0.7)
Eosinophils Relative: 2 % (ref 0–5)
HCT: 39.8 % (ref 36.0–46.0)
Hemoglobin: 13.1 g/dL (ref 12.0–15.0)
Lymphocytes Relative: 45 % (ref 12–46)
Lymphs Abs: 2 10*3/uL (ref 0.7–4.0)
MCH: 29.5 pg (ref 26.0–34.0)
MCHC: 32.9 g/dL (ref 30.0–36.0)
MCV: 89.6 fL (ref 78.0–100.0)
Monocytes Absolute: 0.5 10*3/uL (ref 0.1–1.0)
Monocytes Relative: 11 % (ref 3–12)
Neutro Abs: 1.9 10*3/uL (ref 1.7–7.7)
Neutrophils Relative %: 42 % — ABNORMAL LOW (ref 43–77)
Platelets: 262 10*3/uL (ref 150–400)
RBC: 4.44 MIL/uL (ref 3.87–5.11)
RDW: 13.7 % (ref 11.5–15.5)
WBC: 4.5 10*3/uL (ref 4.0–10.5)

## 2013-06-20 LAB — BASIC METABOLIC PANEL WITH GFR
BUN: 14 mg/dL (ref 6–23)
CO2: 27 mEq/L (ref 19–32)
Calcium: 9.5 mg/dL (ref 8.4–10.5)
Chloride: 103 mEq/L (ref 96–112)
Creat: 0.87 mg/dL (ref 0.50–1.10)
GFR, Est African American: 85 mL/min
GFR, Est Non African American: 74 mL/min
Glucose, Bld: 81 mg/dL (ref 70–99)
Potassium: 4.5 mEq/L (ref 3.5–5.3)
Sodium: 139 mEq/L (ref 135–145)

## 2013-06-20 MED ORDER — PHENTERMINE HCL 37.5 MG PO TABS
37.5000 mg | ORAL_TABLET | Freq: Every day | ORAL | Status: DC
Start: 2013-06-20 — End: 2013-10-11

## 2013-06-20 NOTE — Patient Instructions (Signed)
Will send note to Dr. Earlean Shawl but please call  Phentermine  While taking the medication we will ask that you come into the office once a month to monitor your weight, blood pressure, and heart rate. In addition we can help answer your questions about diet, exercise, and help you every step of the way with your weight loss journey. Sometime it is helpful if you bring in a food diary or use an app on your phone such as myfitnesspal to record your calorie intake, especially in the beginning.   What is this medicine? PHENTERMINE (FEN ter meen) decreases your appetite. This medicine is intended to be used in addition to a healthy reduced calorie diet and exercise. The best results are achieved this way. This medicine is only indicated for short-term use. Eventually your weight loss may level out and the medication will no longer be needed.   How should I use this medicine? Take this medicine by mouth. Follow the directions on the prescription label. The tablets should stay in the bottle until immediately before you take your dose. Take your doses at regular intervals. Do not take your medicine more often than directed.  Overdosage: If you think you have taken too much of this medicine contact a poison control center or emergency room at once. NOTE: This medicine is only for you. Do not share this medicine with others.  What if I miss a dose? If you miss a dose, take it as soon as you can. If it is almost time for your next dose, take only that dose. Do not take double or extra doses. Do not increase or in any way change your dose without consulting your doctor.  What should I watch for while using this medicine? Notify your physician immediately if you become short of breath while doing your normal activities. Do not take this medicine within 6 hours of bedtime. It can keep you from getting to sleep. Avoid drinks that contain caffeine and try to stick to a regular bedtime every night. Do not stand or  sit up quickly, especially if you are an older patient. This reduces the risk of dizzy or fainting spells. Avoid alcoholic drinks.  What side effects may I notice from receiving this medicine? Side effects that you should report to your doctor or health care professional as soon as possible: -chest pain, palpitations -depression or severe changes in mood -increased blood pressure -irritability -nervousness or restlessness -severe dizziness -shortness of breath -problems urinating -unusual swelling of the legs -vomiting  Side effects that usually do not require medical attention (report to your doctor or health care professional if they continue or are bothersome): -blurred vision or other eye problems -changes in sexual ability or desire -constipation or diarrhea -difficulty sleeping -dry mouth or unpleasant taste -headache -nausea This list may not describe all possible side effects. Call your doctor for medical advice about side effects. You may report side effects to FDA at 1-800-FDA-1088.    Bad carbs also include fruit juice, alcohol, and sweet tea. These are empty calories that do not signal to your brain that you are full.   Please remember the good carbs are still carbs which convert into sugar. So please measure them out no more than 1/2-1 cup of rice, oatmeal, pasta, and beans.  Veggies are however free foods! Pile them on.   I like lean protein at every meal such as chicken, [place], pork chops, cottage cheese, etc. Just do not fry these meats and please  center your meal around vegetable, the meats should be a side dish.   No all fruit is created equal. Please see the list below, the fruit at the bottom is higher in sugars than the fruit at the top

## 2013-06-20 NOTE — Progress Notes (Signed)
HPI Patient presents for a one month follow up. She was recently here for a physical.  She states it feels like her rectum is falling out, she has had problems with this in the past, since 2004. She states she is very sore, when she is about to have a BM it feels "very heavy" with relief after BM. Takes stool softeners or it is very hard, some bleeding.  Has seen Dr. Earlean Shawl and Dr. Maurene Capes for this in the past. Last colonoscopy was 2005 with Dr. Earlean Shawl and she is due this year.   In 2004 she was in the hospital for lupus, could not walk after the hospital visit, she can walk now but she states since then she feels off balance.   She had sleep study on Feb 16, has not heard anything back yet- pending results.   Depression- added wellbutrin and she thinks she is feeling much better.   She did have a UTI and was treated with ABX- no more incontinence.    Past Medical History  Diagnosis Date  . Lupus   . Arthritis   . Depression   . Hypertension     Labile  . Cystocele   . Prediabetes   . Seizure   . Vitamin D deficiency   . Hyperlipidemia      Allergies  Allergen Reactions  . Bactrim [Sulfamethoxazole-Tmp Ds]     GI upset  . Cymbalta [Duloxetine Hcl]     Sweating      Current Outpatient Prescriptions on File Prior to Visit  Medication Sig Dispense Refill  . ALPRAZolam (XANAX) 0.5 MG tablet TAKE 1/2 TO 1 TABLET THREE TIMES A DAY AS NEEDED FOR ANXIETY  90 tablet  0  . buPROPion (WELLBUTRIN XL) 150 MG 24 hr tablet Take 1 tablet (150 mg total) by mouth every morning.  30 tablet  2  . Cholecalciferol (VITAMIN D) 2000 UNITS CAPS Take 1 capsule by mouth daily.      . citalopram (CELEXA) 40 MG tablet Take 40 mg by mouth daily.      . fish oil-omega-3 fatty acids 1000 MG capsule Take 1 g by mouth daily.      . Flaxseed, Linseed, (FLAXSEED OIL PO) Take 1,000 mg by mouth daily.       . hydroxychloroquine (PLAQUENIL) 200 MG tablet Take 200 mg by mouth daily.       No current  facility-administered medications on file prior to visit.    ROS: all negative expect above.   Physical: Filed Weights   06/20/13 1358  Weight: 178 lb (80.74 kg)   Filed Vitals:   06/20/13 1358  BP: 110/62  Pulse: 68  Temp: 97.7 F (36.5 C)  Resp: 16   General Appearance: Well nourished, in no apparent distress. Eyes: PERRLA, EOMs. Sinuses: No Frontal/maxillary tenderness ENT/Mouth: Ext aud canals clear, normal light reflex with TMs without erythema, bulging. Post pharynx without erythema, swelling, exudate.  Respiratory: CTAB Cardio: RRR, no murmurs, rubs or gallops. Peripheral pulses brisk and equal bilaterally, without edema. No aortic or femoral bruits. Abdomen: Soft, with bowl sounds. Nontender, no guarding, rebound. Rectum- decreased rectal tone, neg hemoccult, ? Soft bulge/mass at 10 o clock Lymphatics: Non tender without lymphadenopathy.  Musculoskeletal: Full ROM all peripheral extremities, 5/5 strength, and normal gait. Skin: Warm, dry without rashes, lesions, ecchymosis.  Neuro: Cranial nerves intact, reflexes equal bilaterally/? Hyperreflexes. Normal muscle tone, finger to nose, and all other neuro normal but abnormal heel to toe. Sensation intact.  Pysch: Awake and oriented X 3, normal affect, Insight and Judgment appropriate.   Assessment and Plan: Depression- continue wellbutrin UTI- recheck urine ?Rectal prolapse- decreased rectal tone- needs colonoscopy this year- refer to Dr. Earlean Shawl Imbalance- normal neuro exam- ? Need PT, will forward note to her rhem doctor, no need for imaging at this time.  Obesity- phenertmine 37.5- follow up one month

## 2013-06-21 LAB — URINALYSIS, ROUTINE W REFLEX MICROSCOPIC
Bilirubin Urine: NEGATIVE
Glucose, UA: NEGATIVE mg/dL
Hgb urine dipstick: NEGATIVE
Ketones, ur: NEGATIVE mg/dL
Leukocytes, UA: NEGATIVE
Nitrite: NEGATIVE
Protein, ur: NEGATIVE mg/dL
Specific Gravity, Urine: 1.016 (ref 1.005–1.030)
Urobilinogen, UA: 0.2 mg/dL (ref 0.0–1.0)
pH: 5.5 (ref 5.0–8.0)

## 2013-06-21 LAB — URINE CULTURE
Colony Count: NO GROWTH
Organism ID, Bacteria: NO GROWTH

## 2013-07-18 ENCOUNTER — Encounter: Payer: Self-pay | Admitting: Physician Assistant

## 2013-07-18 ENCOUNTER — Ambulatory Visit (INDEPENDENT_AMBULATORY_CARE_PROVIDER_SITE_OTHER): Payer: BC Managed Care – PPO | Admitting: Physician Assistant

## 2013-07-18 VITALS — BP 110/62 | HR 88 | Temp 97.5°F | Resp 16 | Wt 176.0 lb

## 2013-07-18 DIAGNOSIS — E669 Obesity, unspecified: Secondary | ICD-10-CM

## 2013-07-18 DIAGNOSIS — R269 Unspecified abnormalities of gait and mobility: Secondary | ICD-10-CM

## 2013-07-18 DIAGNOSIS — F32A Depression, unspecified: Secondary | ICD-10-CM

## 2013-07-18 DIAGNOSIS — F329 Major depressive disorder, single episode, unspecified: Secondary | ICD-10-CM

## 2013-07-18 DIAGNOSIS — K623 Rectal prolapse: Secondary | ICD-10-CM

## 2013-07-18 DIAGNOSIS — F3289 Other specified depressive episodes: Secondary | ICD-10-CM

## 2013-07-18 DIAGNOSIS — R2689 Other abnormalities of gait and mobility: Secondary | ICD-10-CM

## 2013-07-18 MED ORDER — TOPIRAMATE 25 MG PO TABS: 25.0000 mg | ORAL_TABLET | Freq: Two times a day (BID) | ORAL | Status: DC

## 2013-07-18 NOTE — Patient Instructions (Signed)
Please take the topamax one pill at night 1-4 hours before bed for 1 week, can increase to two pills at night and follow up in one month Keep a food diary    Bad carbs also include fruit juice, alcohol, and sweet tea. These are empty calories that do not signal to your brain that you are full.   Please remember the good carbs are still carbs which convert into sugar. So please measure them out no more than 1/2-1 cup of rice, oatmeal, pasta, and beans.  Veggies are however free foods! Pile them on.   I like lean protein at every meal such as chicken, [place], pork chops, cottage cheese, etc. Just do not fry these meats and please center your meal around vegetable, the meats should be a side dish.   No all fruit is created equal. Please see the list below, the fruit at the bottom is higher in sugars than the fruit at the top   We want weight loss that will last so you should lose 1-2 pounds a week.  THAT IS IT! Please pick THREE things a month to change. Once it is a habit check off the item. Then pick another three items off the list to become habits.  If you are already doing a habit on the list GREAT!  Cross that item off! o Don't drink your calories. Ie, alcohol, soda, fruit juice, and sweet tea.  o Drink more water. Drink a glass when you feel hungry or before each meal.  o Eat breakfast - Complex carb and protein (likeDannon light and fit yogurt, oatmeal, fruit, eggs, [place] bacon). o Measure your cereal.  Eat no more than one cup a day. (ie [place]) o Eat an apple a day. o Add a vegetable a day. o Try a new vegetable a month. o Use Pam! Stop using oil or butter to cook. o Don't finish your plate or use smaller plates. o Share your dessert. o Eat sugar free Jello for dessert or frozen grapes. o Don't eat 2-3 hours before bed. o Switch to whole wheat bread, pasta, and brown rice. o Make healthier choices when you eat out. No fries! o Pick baked chicken, NOT fried. o Don't forget to SLOW  DOWN when you eat. It is not going anywhere.  o Take the stairs. o Park far away in the parking lot o [employer] (or weights) for 10 minutes while watching TV. o Walk at work for 10 minutes during break. o Walk outside 1 time a week with your friend, kids, dog, or significant other. o Start a walking group at [place] the mall as much as you can tolerate.  o Keep a food diary. o Weigh yourself daily. o Walk for 15 minutes 3 days per week. o Cook at home more often and eat out less.  If life happens and you go back to old habits, it is okay.  Just start over. You can do it!   If you experience chest pain, get short of breath, or tired during the exercise, please stop immediately and inform your doctor.

## 2013-07-18 NOTE — Progress Notes (Signed)
58 y.o.female presents for a follow up after being on phentermine for weight loss for 1 months. While on the phentermine they have lost 2 lbs since last visit. They deny palpitations, anxiety, trouble sleeping, elevated BP but states she feels it does not help her with her appetite. She states she has the most problem at night with binge eating.  The wellbutrin added on has helped with depression, she is able to get out and do more, no crying, and states it is helping.   She states it feels like her rectum is falling out, she has had problems with this in the past, since 2004. She states she is very sore, when she is about to have a BM it feels "very heavy" with relief after BM. Takes stool softeners or it is very hard, some bleeding.  Has seen Dr. Earlean Shawl and Dr. Maurene Capes for this in the past. Last colonoscopy was 2005 with Dr. Earlean Shawl and she is due this year.   In 2004 she was in the hospital for lupus, could not walk after the hospital visit, she can walk now but she states since then she feels off balance.    Past Medical History  Diagnosis Date  . Lupus   . Arthritis   . Depression   . Hypertension     Labile  . Cystocele   . Prediabetes   . Seizure   . Vitamin D deficiency   . Hyperlipidemia      Allergies  Allergen Reactions  . Bactrim [Sulfamethoxazole-Tmp Ds]     GI upset  . Cymbalta [Duloxetine Hcl]     Sweating      Current Outpatient Prescriptions on File Prior to Visit  Medication Sig Dispense Refill  . ALPRAZolam (XANAX) 0.5 MG tablet TAKE 1/2 TO 1 TABLET THREE TIMES A DAY AS NEEDED FOR ANXIETY  90 tablet  0  . buPROPion (WELLBUTRIN XL) 150 MG 24 hr tablet Take 1 tablet (150 mg total) by mouth every morning.  30 tablet  2  . Cholecalciferol (VITAMIN D) 2000 UNITS CAPS Take 1 capsule by mouth daily.      . citalopram (CELEXA) 40 MG tablet Take 40 mg by mouth daily.      . fish oil-omega-3 fatty acids 1000 MG capsule Take 1 g by mouth daily.      . Flaxseed, Linseed,  (FLAXSEED OIL PO) Take 1,000 mg by mouth daily.       . hydroxychloroquine (PLAQUENIL) 200 MG tablet Take 200 mg by mouth daily.      . phentermine (ADIPEX-P) 37.5 MG tablet Take 1 tablet (37.5 mg total) by mouth daily before breakfast.  30 tablet  0   No current facility-administered medications on file prior to visit.    ROS: all negative expect above.   Physical exam: Wt Readings from Last 3 Encounters:  07/18/13 176 lb (79.833 kg)  06/20/13 178 lb (80.74 kg)  05/23/13 178 lb (80.74 kg)   Filed Vitals:   07/18/13 1332  BP: 110/62  Pulse: 88  Temp: 97.5 F (36.4 C)  Resp: 16   BP 110/62  Pulse 88  Temp(Src) 97.5 F (36.4 C)  Resp 16  Wt 176 lb (79.833 kg) General appearance: alert, cooperative and moderately obese Eyes: conjunctivae/corneas clear. PERRL, EOM's intact. Fundi benign. Ears: normal TM's and external ear canals both ears Nose: Nares normal. Septum midline. Mucosa normal. No drainage or sinus tenderness. Lungs: clear to auscultation bilaterally Heart: regular rate and rhythm, S1, S2 normal,  no murmur, click, rub or gallop Abdomen: soft, non-tender; bowel sounds normal; no masses,  no organomegaly Extremities: extremities normal, atraumatic, no cyanosis or edema Pulses: 2+ and symmetric Neurologic exam: Speech clear, pupils equal round reactive to light, extraocular movements intact Normal peripheral visual fields Cranial nerves III through XII normal including no facial droop Follows commands, moves all extremities x4, normal strength to bilateral upper and lower extremities at all major muscle groups including grip Sensation normal to light touch and pinprick Coordination intact, no limb ataxia, finger-nose-finger normal Rapid alternating movements normal No pronator drift Gait normal   Assessment and Plan: Obesity with co morbid conditions.  General weight loss/lifestyle modification strategies discussed (elicit support from others; identify  saboteurs; non-food rewards, etc). Behavioral treatment: stress management. Diet interventions: diet diary for the following 1 month. Informal exercise measures discussed, e.g. taking stairs instead of elevator. Regular aerobic exercise program discussed. Medication: Tomapax.for binge eating Follow up in: 1 one month and as needed.  Depression- continue wellbutrin Will get sleep study results  ?Rectal prolapse- will send a note to Dr. Earlean Shawl and patient will call- due for colonoscopy  Imbalance- not better/not worse, does not want PT at this time, will call [name], no imaging at this time

## 2013-07-19 ENCOUNTER — Encounter: Payer: Self-pay | Admitting: Physician Assistant

## 2013-07-23 ENCOUNTER — Other Ambulatory Visit: Payer: Self-pay | Admitting: Physician Assistant

## 2013-07-23 DIAGNOSIS — K623 Rectal prolapse: Secondary | ICD-10-CM

## 2013-07-24 ENCOUNTER — Encounter: Payer: Self-pay | Admitting: Physician Assistant

## 2013-08-09 ENCOUNTER — Telehealth: Payer: Self-pay | Admitting: Physician Assistant

## 2013-08-09 MED ORDER — ALPRAZOLAM 0.5 MG PO TABS: ORAL_TABLET | ORAL | Status: DC

## 2013-08-09 MED ORDER — HYDROXYCHLOROQUINE SULFATE 200 MG PO TABS: 200.0000 mg | ORAL_TABLET | Freq: Every day | ORAL | Status: DC

## 2013-08-11 ENCOUNTER — Other Ambulatory Visit: Payer: Self-pay | Admitting: Internal Medicine

## 2013-08-12 MED ORDER — BUPROPION HCL ER (XL) 150 MG PO TB24: 150.0000 mg | ORAL_TABLET | ORAL | Status: DC

## 2013-08-16 ENCOUNTER — Ambulatory Visit: Payer: Self-pay | Admitting: Physician Assistant

## 2013-08-20 ENCOUNTER — Encounter: Payer: Self-pay | Admitting: Physician Assistant

## 2013-10-11 ENCOUNTER — Ambulatory Visit (INDEPENDENT_AMBULATORY_CARE_PROVIDER_SITE_OTHER): Payer: BC Managed Care – PPO

## 2013-10-11 ENCOUNTER — Ambulatory Visit (INDEPENDENT_AMBULATORY_CARE_PROVIDER_SITE_OTHER): Payer: BC Managed Care – PPO | Admitting: Podiatry

## 2013-10-11 ENCOUNTER — Encounter: Payer: Self-pay | Admitting: Podiatry

## 2013-10-11 VITALS — Ht 61.0 in | Wt 177.0 lb

## 2013-10-11 DIAGNOSIS — S92919A Unspecified fracture of unspecified toe(s), initial encounter for closed fracture: Secondary | ICD-10-CM

## 2013-10-11 NOTE — Progress Notes (Signed)
Subjective:     Patient ID: Beth [name], female   DOB: 07-Feb-1956, 58 y.o.   MRN: [id number]  HPI patient points left fifth toe stating that it is still sore 4 weeks after I dropped an iron on   Review of Systems  All other systems reviewed and are negative.      Objective:   Physical Exam  Nursing note and vitals reviewed. Constitutional: She is oriented to person, place, and time.  Cardiovascular: Intact distal pulses.   Musculoskeletal: Normal range of motion.  Neurological: She is oriented to person, place, and time.  Skin: Skin is warm.   neurovascular status intact with patient having normal muscle strength and range of motion of the subtalar midtarsal joint. Patient is found to have edema in the distal aspect of the left fifth toe with pain when pressed and is noted to have good perfusion to the digits    Assessment:     Probable fractured distal fifth toe left    Plan:     X-rays an H&P reviewed and advised that this should gradually heal but will probably take another 8-12 weeks. Reappoint if symptoms persist over the next 6 weeks

## 2013-10-11 NOTE — Progress Notes (Signed)
   Subjective:    Patient ID: Beth [name], female    DOB: 11-30-1955, 58 y.o.   MRN: [id number]  HPI Comments: Pt states she dropped an iron on her left 5th toe on 09/10/2012, the time the area had a laceration and was painful.  The laceration has healed but the toe is still sore.     Review of Systems  All other systems reviewed and are negative.      Objective:   Physical Exam        Assessment & Plan:

## 2013-10-18 ENCOUNTER — Encounter: Payer: Self-pay | Admitting: Physician Assistant

## 2013-10-18 MED ORDER — BUPROPION HCL ER (XL) 150 MG PO TB24: 150.0000 mg | ORAL_TABLET | ORAL | Status: DC

## 2013-11-06 ENCOUNTER — Encounter: Payer: Self-pay | Admitting: Physician Assistant

## 2013-11-15 ENCOUNTER — Other Ambulatory Visit: Payer: Self-pay | Admitting: Physician Assistant

## 2013-11-19 ENCOUNTER — Encounter: Payer: Self-pay | Admitting: Physician Assistant

## 2013-11-19 ENCOUNTER — Ambulatory Visit (INDEPENDENT_AMBULATORY_CARE_PROVIDER_SITE_OTHER): Payer: BC Managed Care – PPO | Admitting: Physician Assistant

## 2013-11-19 VITALS — BP 120/80 | HR 68 | Temp 98.1°F | Resp 16 | Ht 61.0 in | Wt 177.0 lb

## 2013-11-19 DIAGNOSIS — Z0000 Encounter for general adult medical examination without abnormal findings: Secondary | ICD-10-CM

## 2013-11-19 DIAGNOSIS — I10 Essential (primary) hypertension: Secondary | ICD-10-CM

## 2013-11-19 DIAGNOSIS — R7303 Prediabetes: Secondary | ICD-10-CM

## 2013-11-19 LAB — HEMOGLOBIN A1C
Hgb A1c MFr Bld: 5.9 % — ABNORMAL HIGH (ref <5.7)
Mean Plasma Glucose: 123 mg/dL — ABNORMAL HIGH (ref <117)

## 2013-11-19 LAB — CBC WITH DIFFERENTIAL/PLATELET
Basophils Absolute: 0 10*3/uL (ref 0.0–0.1)
Basophils Relative: 0 % (ref 0–1)
Eosinophils Absolute: 0 10*3/uL (ref 0.0–0.7)
Eosinophils Relative: 1 % (ref 0–5)
HCT: 40 % (ref 36.0–46.0)
Hemoglobin: 13.2 g/dL (ref 12.0–15.0)
Lymphocytes Relative: 47 % — ABNORMAL HIGH (ref 12–46)
Lymphs Abs: 1.8 10*3/uL (ref 0.7–4.0)
MCH: 29.3 pg (ref 26.0–34.0)
MCHC: 33 g/dL (ref 30.0–36.0)
MCV: 88.7 fL (ref 78.0–100.0)
Monocytes Absolute: 0.4 10*3/uL (ref 0.1–1.0)
Monocytes Relative: 10 % (ref 3–12)
Neutro Abs: 1.6 10*3/uL — ABNORMAL LOW (ref 1.7–7.7)
Neutrophils Relative %: 42 % — ABNORMAL LOW (ref 43–77)
Platelets: 259 10*3/uL (ref 150–400)
RBC: 4.51 MIL/uL (ref 3.87–5.11)
RDW: 13.7 % (ref 11.5–15.5)
WBC: 3.8 10*3/uL — ABNORMAL LOW (ref 4.0–10.5)

## 2013-11-19 NOTE — Patient Instructions (Addendum)
Benefiber is good for constipation/diarrhea/irritable bowel syndrome, it helps with weight loss and can help lower your bad cholesterol. Please do 1-2 TBSP in the morning in water, coffee, or tea. It can take up to a month before you can see a difference with your bowel movements. It is cheapest from [employer], [employer], [employer].    Preventative Care for Adults - Female      MAINTAIN REGULAR HEALTH EXAMS:  A routine yearly physical is a good way to check in with your primary care provider about your health and preventive screening. It is also an opportunity to share updates about your health and any concerns you have, and receive a thorough all-over exam.   Most health insurance companies pay for at least some preventative services.  Check with your health plan for specific coverages.  WHAT PREVENTATIVE SERVICES DO WOMEN NEED?  Adult women should have their weight and blood pressure checked regularly.   Women age 58 and older should have their cholesterol levels checked regularly.  Women should be screened for cervical cancer with a Pap smear and pelvic exam beginning at either age 32, or 3 years after they become sexually activity.    Breast cancer screening generally begins at age 58 with a mammogram and breast exam by your primary care provider.    Beginning at age 36 and continuing to age 27, women should be screened for colorectal cancer.  Certain people may need continued testing until age 66.  Updating vaccinations is part of preventative care.  Vaccinations help protect against diseases such as the flu.  Osteoporosis is a disease in which the bones lose minerals and strength as we age. Women ages 58 and over should discuss this with their caregivers, as should women after menopause who have other risk factors.  Lab tests are generally done as part of preventative care to screen for anemia and blood disorders, to screen for problems with the kidneys and liver, to screen for bladder  problems, to check blood sugar, and to check your cholesterol level.  Preventative services generally include counseling about diet, exercise, avoiding tobacco, drugs, excessive alcohol consumption, and sexually transmitted infections.    GENERAL RECOMMENDATIONS FOR GOOD HEALTH:  Healthy diet:  Eat a variety of foods, including fruit, vegetables, animal or vegetable protein, such as meat, fish, chicken, and eggs, or beans, lentils, tofu, and grains, such as rice.  Drink plenty of water daily.  Decrease saturated fat in the diet, avoid lots of red meat, processed foods, sweets, fast foods, and fried foods.  Exercise:  Aerobic exercise helps maintain good heart health. At least 30-40 minutes of moderate-intensity exercise is recommended. For example, a brisk walk that increases your heart rate and breathing. This should be done on most days of the week.   Find a type of exercise or a variety of exercises that you enjoy so that it becomes a part of your daily life.  Examples are running, walking, swimming, water aerobics, and biking.  For motivation and support, explore group exercise such as aerobic class, spin class, Zumba, Yoga,or  martial arts, etc.    Set exercise goals for yourself, such as a certain weight goal, walk or run in a race such as a 5k walk/run.  Speak to your primary care provider about exercise goals.  Disease prevention:  If you smoke or chew tobacco, find out from your caregiver how to quit. It can literally save your life, no matter how long you have been a tobacco user.  If you do not use tobacco, never begin.   Maintain a healthy diet and normal weight. Increased weight leads to problems with blood pressure and diabetes.   The Body Mass Index or BMI is a way of measuring how much of your body is fat. Having a BMI above 27 increases the risk of heart disease, diabetes, hypertension, stroke and other problems related to obesity. Your caregiver can help determine your  BMI and based on it develop an exercise and dietary program to help you achieve or maintain this important measurement at a healthful level.  High blood pressure causes heart and blood vessel problems.  Persistent high blood pressure should be treated with medicine if weight loss and exercise do not work.   Fat and cholesterol leaves deposits in your arteries that can block them. This causes heart disease and vessel disease elsewhere in your body.  If your cholesterol is found to be high, or if you have heart disease or certain other medical conditions, then you may need to have your cholesterol monitored frequently and be treated with medication.   Ask if you should have a cardiac stress test if your history suggests this. A stress test is a test done on a treadmill that looks for heart disease. This test can find disease prior to there being a problem.  Menopause can be associated with physical symptoms and risks. Hormone replacement therapy is available to decrease these. You should talk to your caregiver about whether starting or continuing to take hormones is right for you.   Osteoporosis is a disease in which the bones lose minerals and strength as we age. This can result in serious bone fractures. Risk of osteoporosis can be identified using a bone density scan. Women ages 58 and over should discuss this with their caregivers, as should women after menopause who have other risk factors. Ask your caregiver whether you should be taking a calcium supplement and Vitamin D, to reduce the rate of osteoporosis.   Avoid drinking alcohol in excess (more than two drinks per day).  Avoid use of street drugs. Do not share needles with anyone. Ask for professional help if you need assistance or instructions on stopping the use of alcohol, cigarettes, and/or drugs.  Brush your teeth twice a day with fluoride toothpaste, and floss once a day. Good oral hygiene prevents tooth decay and gum disease. The problems  can be painful, unattractive, and can cause other health problems. Visit your dentist for a routine oral and dental check up and preventive care every 6-12 months.   Look at your skin regularly.  Use a mirror to look at your back. Notify your caregivers of changes in moles, especially if there are changes in shapes, colors, a size larger than a pencil eraser, an irregular border, or development of new moles.  Safety:  Use seatbelts 100% of the time, whether driving or as a passenger.  Use safety devices such as hearing protection if you work in environments with loud noise or significant background noise.  Use safety glasses when doing any work that could send debris in to the eyes.  Use a helmet if you ride a bike or motorcycle.  Use appropriate safety gear for contact sports.  Talk to your caregiver about gun safety.  Use sunscreen with a SPF (or skin protection factor) of 15 or greater.  Lighter skinned people are at a greater risk of skin cancer. Don't forget to also wear sunglasses in order to protect your eyes  from too much damaging sunlight. Damaging sunlight can accelerate cataract formation.   Practice safe sex. Use condoms. Condoms are used for birth control and to help reduce the spread of sexually transmitted infections (or STIs).  Some of the STIs are gonorrhea (the clap), chlamydia, syphilis, trichomonas, herpes, HPV (human papilloma virus) and HIV (human immunodeficiency virus) which causes AIDS. The herpes, HIV and HPV are viral illnesses that have no cure. These can result in disability, cancer and death.   Keep carbon monoxide and smoke detectors in your home functioning at all times. Change the batteries every 6 months or use a model that plugs into the wall.   Vaccinations:  Stay up to date with your tetanus shots and other required immunizations. You should have a booster for tetanus every 10 years. Be sure to get your flu shot every year, since 5%-20% of the [place] population  comes down with the flu. The flu vaccine changes each year, so being vaccinated once is not enough. Get your shot in the fall, before the flu season peaks.   Other vaccines to consider:  Human Papilloma Virus or HPV causes cancer of the cervix, and other infections that can be transmitted from person to person. There is a vaccine for HPV, and females should get immunized between the ages of 11 and 56. It requires a series of 3 shots.   Pneumococcal vaccine to protect against certain types of pneumonia.  This is normally recommended for adults age 12 or older.  However, adults younger than 58 years old with certain underlying conditions such as diabetes, heart or lung disease should also receive the vaccine.  Shingles vaccine to protect against Varicella Zoster if you are older than age 63, or younger than 58 years old with certain underlying illness.  Hepatitis A vaccine to protect against a form of infection of the liver by a virus acquired from food.  Hepatitis B vaccine to protect against a form of infection of the liver by a virus acquired from blood or body fluids, particularly if you work in health care.  If you plan to travel internationally, check with your local health department for specific vaccination recommendations.  Cancer Screening:  Breast cancer screening is essential to preventive care for women. All women age 74 and older should perform a breast self-exam every month. At age 91 and older, women should have their caregiver complete a breast exam each year. Women at ages 63 and older should have a mammogram (x-ray film) of the breasts. Your caregiver can discuss how often you need mammograms.    Cervical cancer screening includes taking a Pap smear (sample of cells examined under a microscope) from the cervix (end of the uterus). It also includes testing for HPV (Human Papilloma Virus, which can cause cervical cancer). Screening and a pelvic exam should begin at age 29, or 3  years after a woman becomes sexually active. Screening should occur every year, with a Pap smear but no HPV testing, up to age 43. After age 20, you should have a Pap smear every 3 years with HPV testing, if no HPV was found previously.   Most routine colon cancer screening begins at the age of 9. On a yearly basis, doctors may provide special easy to use take-home tests to check for hidden blood in the stool. Sigmoidoscopy or colonoscopy can detect the earliest forms of colon cancer and is life saving. These tests use a small camera at the end of a tube to directly  examine the colon. Speak to your caregiver about this at age 75, when routine screening begins (and is repeated every 5 years unless early forms of pre-cancerous polyps or small growths are found).

## 2013-11-19 NOTE — Progress Notes (Signed)
Complete Physical  Assessment and Plan: Lupus- continue follow up, continue meds  Arthritis-continue weight loss, activity  Depression- remission  Hypertension- at goal, cont weight loss   Prediabetes-Discussed general issues about diabetes pathophysiology and management., Educational material distributed., Suggested low cholesterol diet., Encouraged aerobic exercise., Discussed foot care., Reminded to get yearly retinal exam.  Vitamin D deficiency-continue meds, check level  Hyperlipidemia--continue medications, check lipids, decrease fatty foods, increase activity.     Discussed med's effects and SE's. Screening labs and tests as requested with regular follow-up as recommended. Return in about 6 months (around 05/22/2014).  HPI 58 y.o. female  presents for a complete physical. Her blood pressure has been controlled at home, today their BP is BP: 120/80 mmHg She does workout, just started. She denies chest pain, shortness of breath, dizziness.  She is not on cholesterol medication and denies myalgias. Her cholesterol is not at goal. The cholesterol last visit was:   Lab Results  Component Value Date   CHOL 186 05/23/2013   HDL 38* 05/23/2013   LDLCALC 122* 05/23/2013   TRIG 128 05/23/2013   CHOLHDL 4.9 05/23/2013   She has been working on diet and exercise for prediabetes, and denies paresthesia of the feet, polydipsia, polyuria and visual disturbances. Last A1C in the office was:  Lab Results  Component Value Date   HGBA1C 6.1* 05/23/2013   Patient is on Vitamin D supplement.   Lab Results  Component Value Date   VD25OH 68 05/23/2013   Wt Readings from Last 3 Encounters:  11/19/13 177 lb (80.287 kg)  10/11/13 177 lb (80.287 kg)  07/18/13 176 lb (79.833 kg)     Current Medications:  Current Outpatient Prescriptions on File Prior to Visit  Medication Sig Dispense Refill  . ALPRAZolam (XANAX) 0.5 MG tablet TAKE 1/2 TO 1 TABLET THREE TIMES A DAY AS NEEDED FOR ANXIETY  90 tablet  0  .  buPROPion (WELLBUTRIN XL) 150 MG 24 hr tablet Take 1 tablet (150 mg total) by mouth every morning.  30 tablet  0  . Cholecalciferol (VITAMIN D) 2000 UNITS CAPS Take 1 capsule by mouth daily.      . citalopram (CELEXA) 40 MG tablet TAKE 1 TABLET DAILY FOR MOOD  90 tablet  1  . hydroxychloroquine (PLAQUENIL) 200 MG tablet Take 1 tablet (200 mg total) by mouth daily.  90 tablet  1  . Magnesium 500 MG CAPS Take by mouth.       No current facility-administered medications on file prior to visit.   Health Maintenance:   Immunization History  Administered Date(s) Administered  . Td 05/19/2003  . Tdap 05/23/2013   Tetanus: 2015 Pneumovax: N/A Flu vaccine: declines Zostavax: N/A Pap: 2015 with Quincy Sheehan MGM: 05/2013 DEXA: 2007 Colonoscopy: 2015 in [place] due 10 years EGD: N/A  Patient Care Team: [name], MD as PCP - General (Internal Medicine) Signe Colt, MD as Referring Physician (Obstetrics and Gynecology) Kristeen Miss, MD as Consulting Physician (Neurosurgery) Hennie Duos, MD as Consulting Physician (Rheumatology) Olean Ree, MD as Referring Physician (Internal Medicine)  Allergies:  Allergies  Allergen Reactions  . Bactrim [Sulfamethoxazole-Tmp Ds]     GI upset  . Cymbalta [Duloxetine Hcl]     Sweating   Medical History:  Past Medical History  Diagnosis Date  . Lupus   . Arthritis   . Depression   . Hypertension     Labile  . Cystocele   . Prediabetes   . Seizure   .  Vitamin D deficiency   . Hyperlipidemia    Surgical History:  Past Surgical History  Procedure Laterality Date  . Abdominal hysterectomy    . Cesarean section     Family History:  Family History  Problem Relation Age of Onset  . Hyperlipidemia Mother   . Hypertension Mother   . Heart disease Mother    Social History:  History  Substance Use Topics  . Smoking status: Never Smoker   . Smokeless tobacco: Never Used  . Alcohol Use: No   Review of Systems: [X]  =  complains of  [ ]  = denies  General: Fatigue [ ]  Fever [ ]  Chills [ ]  Weakness [ ]   Insomnia [ ] Weight change [ ]  Night sweats [ ]   Change in appetite [ ]  Eyes: Redness [ ]  Blurred vision [ ]  Diplopia [ ]  Discharge [ ]   ENT: Congestion [ ]  Sinus Pain [ ]  Post Nasal Drip [ ]  Sore Throat [ ]  Earache [ ]  hearing loss [ ]  Tinnitus [ ]  Snoring [ ]   Cardiac: Chest pain/pressure [ ]  SOB [ ]  Orthopnea [ ]   Palpitations [ ]   Paroxysmal nocturnal dyspnea[ ]  Claudication [ ]  Edema [ ]   Pulmonary: Cough [ ]  Wheezing[ ]   SOB [ ]   Pleurisy [ ]   GI: Nausea [ ]  Vomiting[ ]  Dysphagia[ ]  Heartburn[ ]  Abdominal pain [ ]  Constipation [ ] ; Diarrhea [ ]  BRBPR [ ]  Melena[ ]  Bloating [ ]  Hemorrhoids [ ]   GU: Hematuria[ ]  Dysuria [ ]  Nocturia[ ]  Urgency [ ]   Hesitancy [ ]  Discharge [ ]  Frequency [ ]   Breast:  Breast lumps [ ]   nipple discharge [ ]    Neuro: Headaches[ ]  Vertigo[ ]  Paresthesias[ ]  Spasm [ ]  Speech changes [ ]  Incoordination [ ]   Ortho: Arthritis [ ]  Joint pain RIGHT KNEE POPS/CLICKS Valu.Nieves ] Muscle pain [ ]  Joint swelling [ ]  Back Pain [ ]  Skin:  Rash [ ]   Pruritis [ ]  Change in skin lesion [ ]   Psych: Depression[ ]  Anxiety[ ]  Confusion [ ]  Memory loss [ ]   Heme/Lypmh: Bleeding [ ]  Bruising [ ]  Enlarged lymph nodes [ ]   Endocrine: Visual blurring [ ]  Paresthesia [ ]  Polyuria [ ]  Polydypsea [ ]    Heat/cold intolerance [ ]  Hypoglycemia [ ]   Physical Exam: Estimated body mass index is 33.46 kg/(m^2) as calculated from the following:   Height as of this encounter: 5\' 1"  (1.549 m).   Weight as of this encounter: 177 lb (80.287 kg). BP 120/80  Pulse 68  Temp(Src) 98.1 F (36.7 C)  Resp 16  Ht 5\' 1"  (1.549 m)  Wt 177 lb (80.287 kg)  BMI 33.46 kg/m2 General Appearance: Well nourished, in no apparent distress. Eyes: PERRLA, EOMs, conjunctiva no swelling or erythema, normal fundi and vessels. Sinuses: No Frontal/maxillary tenderness ENT/Mouth: Ext aud canals clear, normal light reflex with TMs without  erythema, bulging.  Good dentition. No erythema, swelling, or exudate on post pharynx. Tonsils not swollen or erythematous. Hearing normal.  Neck: Supple, thyroid normal. No bruits Respiratory: Respiratory effort normal, BS equal bilaterally without rales, rhonchi, wheezing or stridor. Cardio: RRR without murmurs, rubs or gallops. Brisk peripheral pulses without edema.  Chest: symmetric, with normal excursions and percussion. Breasts: defer Abdomen: Soft, +BS. Non tender, no guarding, rebound, hernias, masses, or organomegaly. .  Lymphatics: Non tender without lymphadenopathy.  Genitourinary: defer Musculoskeletal: Full ROM all peripheral extremities,5/5 strength, and normal gait. Skin: Warm, dry without rashes, lesions, ecchymosis.  Neuro: Cranial nerves intact, reflexes equal bilaterally. Normal muscle tone, no cerebellar symptoms. Sensation intact.  Psych: Awake and oriented X 3, normal affect, Insight and Judgment appropriate.   EKG: WNL no changes. AORTA SCAN: WNL    Vicie Mutters 10:26 AM

## 2013-11-20 LAB — BASIC METABOLIC PANEL WITHOUT GFR
BUN: 15 mg/dL (ref 6–23)
CO2: 24 meq/L (ref 19–32)
Calcium: 9.2 mg/dL (ref 8.4–10.5)
Chloride: 106 meq/L (ref 96–112)
Creat: 0.68 mg/dL (ref 0.50–1.10)
GFR, Est African American: >89 mL/min
GFR, Est Non African American: >89 mL/min
Glucose, Bld: 82 mg/dL (ref 70–99)
Potassium: 4.6 meq/L (ref 3.5–5.3)
Sodium: 138 meq/L (ref 135–145)

## 2013-11-20 LAB — URINALYSIS, ROUTINE W REFLEX MICROSCOPIC
Bilirubin Urine: NEGATIVE
Glucose, UA: NEGATIVE mg/dL
Hgb urine dipstick: NEGATIVE
Ketones, ur: NEGATIVE mg/dL
Leukocytes, UA: NEGATIVE
Nitrite: NEGATIVE
Protein, ur: NEGATIVE mg/dL
Specific Gravity, Urine: 1.023 (ref 1.005–1.030)
Urobilinogen, UA: 0.2 mg/dL (ref 0.0–1.0)
pH: 5.5 (ref 5.0–8.0)

## 2013-11-20 LAB — LIPID PANEL
Cholesterol: 161 mg/dL (ref 0–200)
HDL: 37 mg/dL — ABNORMAL LOW
LDL Cholesterol: 107 mg/dL — ABNORMAL HIGH (ref 0–99)
Total CHOL/HDL Ratio: 4.4 ratio
Triglycerides: 84 mg/dL
VLDL: 17 mg/dL (ref 0–40)

## 2013-11-20 LAB — HEPATIC FUNCTION PANEL
ALT: 14 U/L (ref 0–35)
AST: 18 U/L (ref 0–37)
Albumin: 3.8 g/dL (ref 3.5–5.2)
Alkaline Phosphatase: 48 U/L (ref 39–117)
Bilirubin, Direct: 0.1 mg/dL (ref 0.0–0.3)
Indirect Bilirubin: 0.2 mg/dL (ref 0.2–1.2)
Total Bilirubin: 0.3 mg/dL (ref 0.2–1.2)
Total Protein: 7.2 g/dL (ref 6.0–8.3)

## 2013-11-20 LAB — MICROALBUMIN / CREATININE URINE RATIO
Creatinine, Urine: 114.1 mg/dL
Microalb Creat Ratio: 4.4 mg/g (ref 0.0–30.0)
Microalb, Ur: 0.5 mg/dL (ref 0.00–1.89)

## 2013-11-20 LAB — FERRITIN: Ferritin: 194 ng/mL (ref 10–291)

## 2013-11-20 LAB — VITAMIN D 25 HYDROXY (VIT D DEFICIENCY, FRACTURES): Vit D, 25-Hydroxy: 68 ng/mL (ref 30–89)

## 2013-11-20 LAB — IRON AND TIBC
%SAT: 21 % (ref 20–55)
Iron: 59 ug/dL (ref 42–145)
TIBC: 284 ug/dL (ref 250–470)
UIBC: 225 ug/dL (ref 125–400)

## 2013-11-20 LAB — MAGNESIUM: Magnesium: 1.9 mg/dL (ref 1.5–2.5)

## 2013-11-20 LAB — VITAMIN B12: Vitamin B-12: 527 pg/mL (ref 211–911)

## 2013-11-20 LAB — INSULIN, FASTING: Insulin fasting, serum: 16 u[IU]/mL (ref 3–28)

## 2013-11-20 LAB — TSH: TSH: 1.158 u[IU]/mL (ref 0.350–4.500)

## 2013-12-12 ENCOUNTER — Other Ambulatory Visit: Payer: Self-pay | Admitting: Emergency Medicine

## 2013-12-12 ENCOUNTER — Other Ambulatory Visit: Payer: Self-pay | Admitting: Physician Assistant

## 2013-12-12 MED ORDER — ALPRAZOLAM 0.5 MG PO TABS: ORAL_TABLET | ORAL | Status: DC

## 2013-12-12 MED ORDER — BUPROPION HCL ER (XL) 150 MG PO TB24: 150.0000 mg | ORAL_TABLET | ORAL | Status: DC

## 2014-01-16 ENCOUNTER — Encounter: Payer: Self-pay | Admitting: Physician Assistant

## 2014-01-16 MED ORDER — BUPROPION HCL ER (XL) 150 MG PO TB24: 150.0000 mg | ORAL_TABLET | ORAL | Status: DC

## 2014-01-17 ENCOUNTER — Other Ambulatory Visit: Payer: Self-pay | Admitting: Physician Assistant

## 2014-02-12 ENCOUNTER — Other Ambulatory Visit: Payer: Self-pay | Admitting: Physician Assistant

## 2014-02-13 ENCOUNTER — Encounter: Payer: Self-pay | Admitting: Physician Assistant

## 2014-02-14 MED ORDER — ALPRAZOLAM 0.5 MG PO TABS: ORAL_TABLET | ORAL | Status: DC

## 2014-02-15 ENCOUNTER — Encounter: Payer: Self-pay | Admitting: Physician Assistant

## 2014-03-19 ENCOUNTER — Encounter: Payer: Self-pay | Admitting: Physician Assistant

## 2014-03-25 ENCOUNTER — Encounter: Payer: Self-pay | Admitting: Physician Assistant

## 2014-03-28 ENCOUNTER — Ambulatory Visit: Payer: Self-pay | Admitting: Physician Assistant

## 2014-04-16 ENCOUNTER — Encounter: Payer: Self-pay | Admitting: Physician Assistant

## 2014-04-16 MED ORDER — BUPROPION HCL ER (XL) 150 MG PO TB24: 150.0000 mg | ORAL_TABLET | ORAL | Status: DC

## 2014-05-23 ENCOUNTER — Ambulatory Visit: Payer: Self-pay | Admitting: Physician Assistant

## 2014-05-28 ENCOUNTER — Encounter: Payer: Self-pay | Admitting: Physician Assistant

## 2014-05-28 ENCOUNTER — Ambulatory Visit (INDEPENDENT_AMBULATORY_CARE_PROVIDER_SITE_OTHER): Payer: BC Managed Care – PPO | Admitting: Physician Assistant

## 2014-05-28 VITALS — BP 110/78 | HR 68 | Temp 97.5°F | Resp 16 | Ht 61.0 in | Wt 180.0 lb

## 2014-05-28 DIAGNOSIS — E559 Vitamin D deficiency, unspecified: Secondary | ICD-10-CM

## 2014-05-28 DIAGNOSIS — I10 Essential (primary) hypertension: Secondary | ICD-10-CM

## 2014-05-28 DIAGNOSIS — E785 Hyperlipidemia, unspecified: Secondary | ICD-10-CM

## 2014-05-28 DIAGNOSIS — N393 Stress incontinence (female) (male): Secondary | ICD-10-CM | POA: Insufficient documentation

## 2014-05-28 DIAGNOSIS — R7303 Prediabetes: Secondary | ICD-10-CM

## 2014-05-28 DIAGNOSIS — F324 Major depressive disorder, single episode, in partial remission: Secondary | ICD-10-CM

## 2014-05-28 DIAGNOSIS — E669 Obesity, unspecified: Secondary | ICD-10-CM

## 2014-05-28 DIAGNOSIS — R350 Frequency of micturition: Secondary | ICD-10-CM

## 2014-05-28 DIAGNOSIS — R7309 Other abnormal glucose: Secondary | ICD-10-CM

## 2014-05-28 DIAGNOSIS — Z79899 Other long term (current) drug therapy: Secondary | ICD-10-CM | POA: Insufficient documentation

## 2014-05-28 DIAGNOSIS — F325 Major depressive disorder, single episode, in full remission: Secondary | ICD-10-CM

## 2014-05-28 MED ORDER — MIRABEGRON ER 50 MG PO TB24: 50.0000 mg | ORAL_TABLET | Freq: Every day | ORAL | Status: DC

## 2014-05-28 MED ORDER — PHENTERMINE HCL 37.5 MG PO TABS: 37.5000 mg | ORAL_TABLET | Freq: Every day | ORAL | Status: DC

## 2014-05-28 NOTE — Patient Instructions (Signed)
Use a dropper to put olive oil or canola oil in the effected ear- 2-3 times a week. Let it soak for 20-30 min then you can take a shower or use a baby bulb with warm water to wash out the ear wax.  Do not use Qtips  Talk with Quincy Sheehan about cystocele, can try samples of medication called Myrebetriq. Can do 25mg  or 50mg  daily, can also cut the 50mg  in half.   Phentermine  While taking the medication we may ask that you come into the office once a month or once every 2-3 months to monitor your weight, blood pressure, and heart rate. In addition we can help answer your questions about diet, exercise, and help you every step of the way with your weight loss journey. Sometime it is helpful if you bring in a food diary or use an app on your phone such as myfitnesspal to record your calorie intake, especially in the beginning.   You can start out on 1/3 to 1/2 a pill in the morning and if you are tolerating it well you can increase to one pill daily. I also have some patients that take 1/3 or 1/2 at lunch to help prevent night time eating.  This medication is cheapest CASH pay at [facility] and you do NOT need a membership to get meds from there.    What is this medicine? PHENTERMINE (FEN ter meen) decreases your appetite. This medicine is intended to be used in addition to a healthy reduced calorie diet and exercise. The best results are achieved this way. This medicine is only indicated for short-term use. Eventually your weight loss may level out and the medication will no longer be needed.   How should I use this medicine? Take this medicine by mouth. Follow the directions on the prescription label. The tablets should stay in the bottle until immediately before you take your dose. Take your doses at regular intervals. Do not take your medicine more often than directed.  Overdosage: If you think you have taken too much of this medicine contact a poison control center or emergency room at  once. NOTE: This medicine is only for you. Do not share this medicine with others.  What if I miss a dose? If you miss a dose, take it as soon as you can. If it is almost time for your next dose, take only that dose. Do not take double or extra doses. Do not increase or in any way change your dose without consulting your doctor.  What should I watch for while using this medicine? Notify your physician immediately if you become short of breath while doing your normal activities. Do not take this medicine within 6 hours of bedtime. It can keep you from getting to sleep. Avoid drinks that contain caffeine and try to stick to a regular bedtime every night. Do not stand or sit up quickly, especially if you are an older patient. This reduces the risk of dizzy or fainting spells. Avoid alcoholic drinks.  What side effects may I notice from receiving this medicine? Side effects that you should report to your doctor or health care professional as soon as possible: -chest pain, palpitations -depression or severe changes in mood -increased blood pressure -irritability -nervousness or restlessness -severe dizziness -shortness of breath -problems urinating -unusual swelling of the legs -vomiting  Side effects that usually do not require medical attention (report to your doctor or health care professional if they continue or are bothersome): -  blurred vision or other eye problems -changes in sexual ability or desire -constipation or diarrhea -difficulty sleeping -dry mouth or unpleasant taste -headache -nausea This list may not describe all possible side effects. Call your doctor for medical advice about side effects. You may report side effects to FDA at 1-800-FDA-1088.   Ways to cut 100 calories  1. Eat your eggs with hot sauce OR salsa instead of cheese.  Eggs are great for breakfast, but many people consider eggs and cheese to be BFFs. Instead of cheese-1 oz. of cheddar has 114 calories-top  your eggs with hot sauce, which contains no calories and helps with satiety and metabolism. Salsa is also a great option!!  2. Top your toast, waffles or pancakes with mashed berries instead of jelly or syrup. Half a cup of berries-fresh, frozen or thawed-has about 40 calories, compared with 2 tbsp. of maple syrup or jelly, which both have about 100 calories. The berries will also give you a good punch of fiber, which helps keep you full and satisfied and won't spike blood sugar quickly like the jelly or syrup. 3. Swap the non-fat latte for black coffee with a splash of half-and-half. Contrary to its name, that non-fat latte has 130 calories and a startling 19g of carbohydrates per 16 oz. serving. Replacing that 'light' drinkable dessert with a black coffee with a splash of half-and-half saves you more than 100 calories per 16 oz. serving. 4. Sprinkle salads with freeze-dried raspberries instead of dried cranberries. If you want a sweet addition to your nutritious salad, stay away from dried cranberries. They have a whopping 130 calories per  cup and 30g carbohydrates. Instead, sprinkle freeze-dried raspberries guilt-free and save more than 100 calories per  cup serving, adding 3g of belly-filling fiber. 5. Go for mustard in place of mayo on your sandwich. Mustard can add really nice flavor to any sandwich, and there are tons of varieties, from spicy to honey. A serving of mayo is 95 calories, versus 10 calories in a serving of mustard. 6. Choose a DIY salad dressing instead of the store-bought kind. Mix Dijon or whole grain mustard with low-fat Kefir or red wine vinegar and garlic. 7. Use hummus as a spread instead of a dip. Use hummus as a spread on a high-fiber cracker or tortilla with a sandwich and save on calories without sacrificing taste. 8. Pick just one salad "accessory." Salad isn't automatically a calorie winner. It's easy to over-accessorize with toppings. Instead of topping your salad  with nuts, avocado and cranberries (all three will clock in at 313 calories), just pick one. The next day, choose a different accessory, which will also keep your salad interesting. You don't wear all your jewelry every day, right? 9. Ditch the white pasta in favor of spaghetti squash. One cup of cooked spaghetti squash has about 40 calories, compared with traditional spaghetti, which comes with more than 200. Spaghetti squash is also nutrient-dense. It's a good source of fiber and Vitamins A and C, and it can be eaten just like you would eat pasta-with a great tomato sauce and [place] meatballs or with pesto, tofu and spinach, for example. 10. Dress up your chili, soups and stews with non-fat [place] yogurt instead of sour cream. Just a 'dollop' of sour cream can set you back 115 calories and a whopping 12g of fat-seven of which are of the artery-clogging variety. Added bonus: [place] yogurt is packed with muscle-building protein, calcium and B Vitamins. 11. Mash cauliflower instead of mashed  potatoes. One cup of traditional mashed potatoes-in all their creamy goodness-has more than 200 calories, compared to mashed cauliflower, which you can typically eat for less than 100 calories per 1 cup serving. Cauliflower is a great source of the antioxidant indole-3-carbinol (I3C), which may help reduce the risk of some cancers, like breast cancer. 12. Ditch the ice cream sundae in favor of a [place] yogurt parfait. Instead of a cup of ice cream or fro-yo for dessert, try 1 cup of nonfat Greek yogurt topped with fresh berries and a sprinkle of cacao nibs. Both toppings are packed with antioxidants, which can help reduce cellular inflammation and oxidative damage. And the comparison is a no-brainer: One cup of ice cream has about 275 calories; one cup of frozen yogurt has about 230; and a cup of Greek yogurt has just 130, plus twice the protein, so you're less likely to return to the freezer for a second helping. 13. Put  olive oil in a spray container instead of using it directly from the bottle. Each tablespoon of olive oil is 120 calories and 15g of fat. Use a mister instead of pouring it straight into the pan or onto a salad. This allows for portion control and will save you more than 100 calories. 14. When baking, substitute canned pumpkin for butter or oil. Canned pumpkin-not pumpkin pie mix-is loaded with Vitamin A, which is important for skin and eye health, as well as immunity. And the comparisons are pretty crazy:  cup of canned pumpkin has about 40 calories, compared to butter or oil, which has more than 800 calories. Yes, 800 calories. Applesauce and mashed banana can also serve as good substitutions for butter or oil, usually in a 1:1 ratio. 15. Top casseroles with high-fiber cereal instead of breadcrumbs. Breadcrumbs are typically made with white bread, while breakfast cereals contain 5-9g of fiber per serving. Not only will you save more than 150 calories per  cup serving, the swap will also keep you more full and you'll get a metabolism boost from the added fiber. 16. Snack on pistachios instead of macadamia nuts. Believe it or not, you get the same amount of calories from 35 pistachios (100 calories) as you would from only five macadamia nuts. 17. Chow down on kale chips rather than potato chips. This is my favorite 'don't knock it 'till you try it' swap. Kale chips are so easy to make at home, and you can spice them up with a little grated parmesan or chili powder. Plus, they're a mere fraction of the calories of potato chips, but with the same crunch factor we crave so often. 18. Add seltzer and some fruit slices to your cocktail instead of soda or fruit juice. One cup of soda or fruit juice can pack on as much as 140 calories. Instead, use seltzer and fruit slices. The fruit provides valuable phytochemicals, such as flavonoids and anthocyanins, which help to combat cancer and stave off the aging  process.

## 2014-05-28 NOTE — Progress Notes (Addendum)
Assessment and Plan:  Hypertension: Continue medication, monitor blood pressure at home. Continue DASH diet.  Reminder to go to the ER if any CP, SOB, nausea, dizziness, severe HA, changes vision/speech, left arm numbness and tingling, and jaw pain. Cholesterol: Continue diet and exercise. Check cholesterol.  Pre-diabetes-Continue diet and exercise. Check A1C Vitamin D Def- check level and continue medications.  Obesity with co morbidities- long discussion about weight loss, diet, and exercise, will start the patient on phentermine- hand out given and AE's discussed, will do close follow up.  Stress incontinence- bladder matters book given, weight loss advised, follow up with Quincy Sheehan, NP for cystocele, rule out UTI, and will give samples of myrebetriq since patient has constipation Cerumen impaction- stop using Qtips, use OTC drops/oil, if not better come to the office for irrigation  Continue diet and meds as discussed. Further disposition pending results of labs. Addendum: +UTI, will send in [place], follow up 1 month recheck UA C&S lab only  HPI 59 y.o. female  presents for 3 month follow up with hypertension, hyperlipidemia, prediabetes and vitamin D. Her blood pressure has been controlled at home, today their BP is BP: 110/78 mmHg She does not workout but wants to start. She denies chest pain, shortness of breath, dizziness.  She is not on cholesterol medication and denies myalgias. Her cholesterol is not at goal. The cholesterol last visit was:   Lab Results  Component Value Date   CHOL 161 11/19/2013   HDL 37* 11/19/2013   LDLCALC 107* 11/19/2013   TRIG 84 11/19/2013   CHOLHDL 4.4 11/19/2013  She has been working on diet and exercise for prediabetes, and denies paresthesia of the feet, polydipsia, polyuria and visual disturbances. Last A1C in the office was:  Lab Results  Component Value Date   HGBA1C 5.9* 11/19/2013  Patient is on Vitamin D supplement.   Lab Results   Component Value Date   VD25OH 68 11/19/2013  She is on celexa, wellbutrin, xanax 1/2 in AM and 1/2 in PM for anxiety.  She has lupus and is on plaquenil, follows with [name].  She has a history of cystocele, states that with coughing/laughing she is having incontinence, she is having urinary frequency, nocturia 2-3 times, has heaviness with standing but denies dysuria, AB pain, low back pain, anything falling out.  BMI is Body mass index is 34.03 kg/(m^2)., she is working on diet and exercise, she states that her son is getting married in April and she is requesting phentermine, has been on it before over a year ago. Wt Readings from Last 3 Encounters:  05/28/14 180 lb (81.647 kg)  11/19/13 177 lb (80.287 kg)  10/11/13 177 lb (80.287 kg)      Current Medications:  Current Outpatient Prescriptions on File Prior to Visit  Medication Sig Dispense Refill  . ALPRAZolam (XANAX) 0.5 MG tablet TAKE 1/2 TO 1 TABLET BY MOUTH 3 TIMES A DAY AS NEEDED FOR ANXIETY 90 tablet 1  . buPROPion (WELLBUTRIN XL) 150 MG 24 hr tablet Take 1 tablet (150 mg total) by mouth every morning. 30 tablet 3  . Cholecalciferol (VITAMIN D) 2000 UNITS CAPS Take 1 capsule by mouth daily.    . citalopram (CELEXA) 40 MG tablet TAKE 1 TABLET DAILY FOR MOOD 90 tablet 1  . hydroxychloroquine (PLAQUENIL) 200 MG tablet Take 1 tablet (200 mg total) by mouth daily. 90 tablet 1  . Magnesium 500 MG CAPS Take by mouth.     No current facility-administered medications  on file prior to visit.   Medical History:  Past Medical History  Diagnosis Date  . Lupus   . Arthritis   . Depression   . Hypertension     Labile  . Cystocele   . Prediabetes   . Seizure   . Vitamin D deficiency   . Hyperlipidemia    Allergies:  Allergies  Allergen Reactions  . Bactrim [Sulfamethoxazole-Trimethoprim]     GI upset  . Cymbalta [Duloxetine Hcl]     Sweating    Review of Systems:  Review of Systems  Constitutional: Negative.   HENT:  Positive for hearing loss (cerumen impaction). Negative for congestion, ear discharge, ear pain, nosebleeds, sore throat and tinnitus.   Eyes: Negative.   Respiratory: Negative.  Negative for stridor.   Cardiovascular: Negative.   Gastrointestinal: Positive for constipation (better with Mag, increase water, and increase veggies). Negative for heartburn, nausea, vomiting, abdominal pain, diarrhea, blood in stool and melena.  Genitourinary: Positive for urgency and frequency. Negative for dysuria, hematuria and flank pain.       Stress incontinence  Musculoskeletal: Negative.   Skin: Negative.   Neurological: Negative.  Negative for headaches.  Psychiatric/Behavioral: Negative.     Family history- Review and unchanged Social history- Review and unchanged Physical Exam: BP 110/78 mmHg  Pulse 68  Temp(Src) 97.5 F (36.4 C)  Resp 16  Ht 5\' 1"  (1.549 m)  Wt 180 lb (81.647 kg)  BMI 34.03 kg/m2 Wt Readings from Last 3 Encounters:  05/28/14 180 lb (81.647 kg)  11/19/13 177 lb (80.287 kg)  10/11/13 177 lb (80.287 kg)   General Appearance: Well nourished, in no apparent distress. Eyes: PERRLA, EOMs, conjunctiva no swelling or erythema Sinuses: No Frontal/maxillary tenderness ENT/Mouth: Ext aud canals clear, cerumen impaction left ear, TMs without erythema, bulging. No erythema, swelling, or exudate on post pharynx.  Tonsils not swollen or erythematous. Hearing normal.  Neck: Supple, thyroid normal.  Respiratory: Respiratory effort normal, BS equal bilaterally without rales, rhonchi, wheezing or stridor.  Cardio: RRR with no MRGs. Brisk peripheral pulses without edema.  Abdomen: Soft, + BS.  Non tender, no guarding, rebound, hernias, masses. Lymphatics: Non tender without lymphadenopathy.  Musculoskeletal: Full ROM, 5/5 strength, normal gait.  Skin: Warm, dry without rashes, lesions, ecchymosis.  Neuro: Cranial nerves intact. Normal muscle tone, no cerebellar symptoms. Sensation intact.   Psych: Awake and oriented X 3, normal affect, Insight and Judgment appropriate.    Vicie Mutters, PA-C 3:16 PM [facility]

## 2014-05-29 LAB — HEPATIC FUNCTION PANEL
ALT: 12 U/L (ref 0–35)
AST: 16 U/L (ref 0–37)
Albumin: 4 g/dL (ref 3.5–5.2)
Alkaline Phosphatase: 53 U/L (ref 39–117)
Bilirubin, Direct: <0.1 mg/dL (ref 0.0–0.3)
Total Bilirubin: 0.3 mg/dL (ref 0.2–1.2)
Total Protein: 7.7 g/dL (ref 6.0–8.3)

## 2014-05-29 LAB — CBC WITH DIFFERENTIAL/PLATELET
Basophils Absolute: 0 10*3/uL (ref 0.0–0.1)
Basophils Relative: 0 % (ref 0–1)
Eosinophils Absolute: 0.1 10*3/uL (ref 0.0–0.7)
Eosinophils Relative: 3 % (ref 0–5)
HCT: 41.8 % (ref 36.0–46.0)
Hemoglobin: 13.4 g/dL (ref 12.0–15.0)
Lymphocytes Relative: 44 % (ref 12–46)
Lymphs Abs: 2.1 10*3/uL (ref 0.7–4.0)
MCH: 29.1 pg (ref 26.0–34.0)
MCHC: 32.1 g/dL (ref 30.0–36.0)
MCV: 90.7 fL (ref 78.0–100.0)
MPV: 9.1 fL (ref 8.6–12.4)
Monocytes Absolute: 0.6 10*3/uL (ref 0.1–1.0)
Monocytes Relative: 13 % — ABNORMAL HIGH (ref 3–12)
Neutro Abs: 1.9 10*3/uL (ref 1.7–7.7)
Neutrophils Relative %: 40 % — ABNORMAL LOW (ref 43–77)
Platelets: 334 10*3/uL (ref 150–400)
RBC: 4.61 MIL/uL (ref 3.87–5.11)
RDW: 13.5 % (ref 11.5–15.5)
WBC: 4.8 10*3/uL (ref 4.0–10.5)

## 2014-05-29 LAB — BASIC METABOLIC PANEL WITH GFR
BUN: 16 mg/dL (ref 6–23)
CO2: 25 mEq/L (ref 19–32)
Calcium: 9.6 mg/dL (ref 8.4–10.5)
Chloride: 102 mEq/L (ref 96–112)
Creat: 0.79 mg/dL (ref 0.50–1.10)
GFR, Est African American: >89 mL/min
GFR, Est Non African American: 83 mL/min
Glucose, Bld: 78 mg/dL (ref 70–99)
Potassium: 4.4 mEq/L (ref 3.5–5.3)
Sodium: 139 mEq/L (ref 135–145)

## 2014-05-29 LAB — URINALYSIS, ROUTINE W REFLEX MICROSCOPIC
Bilirubin Urine: NEGATIVE
Glucose, UA: NEGATIVE mg/dL
Hgb urine dipstick: NEGATIVE
Ketones, ur: NEGATIVE mg/dL
Leukocytes, UA: NEGATIVE
Nitrite: NEGATIVE
Protein, ur: NEGATIVE mg/dL
Specific Gravity, Urine: 1.023 (ref 1.005–1.030)
Urobilinogen, UA: 0.2 mg/dL (ref 0.0–1.0)
pH: 6 (ref 5.0–8.0)

## 2014-05-29 LAB — LIPID PANEL
Cholesterol: 180 mg/dL (ref 0–200)
HDL: 27 mg/dL — ABNORMAL LOW (ref >39)
LDL Cholesterol: 119 mg/dL — ABNORMAL HIGH (ref 0–99)
Total CHOL/HDL Ratio: 6.7 Ratio
Triglycerides: 171 mg/dL — ABNORMAL HIGH (ref <150)
VLDL: 34 mg/dL (ref 0–40)

## 2014-05-29 LAB — HEMOGLOBIN A1C
Hgb A1c MFr Bld: 6 % — ABNORMAL HIGH (ref <5.7)
Mean Plasma Glucose: 126 mg/dL — ABNORMAL HIGH (ref <117)

## 2014-05-29 LAB — MAGNESIUM: Magnesium: 1.8 mg/dL (ref 1.5–2.5)

## 2014-05-29 LAB — TSH: TSH: 1.368 u[IU]/mL (ref 0.350–4.500)

## 2014-05-29 LAB — INSULIN, FASTING: Insulin fasting, serum: 21.1 u[IU]/mL — ABNORMAL HIGH (ref 2.0–19.6)

## 2014-05-29 LAB — VITAMIN D 25 HYDROXY (VIT D DEFICIENCY, FRACTURES): Vit D, 25-Hydroxy: 61 ng/mL (ref 30–100)

## 2014-05-31 LAB — URINE CULTURE: Colony Count: >=100000

## 2014-05-31 MED ORDER — NITROFURANTOIN MONOHYD MACRO 100 MG PO CAPS: 100.0000 mg | ORAL_CAPSULE | Freq: Two times a day (BID) | ORAL | Status: AC

## 2014-05-31 NOTE — Addendum Note (Signed)
Addended by: Vicie Mutters R on: 05/31/2014 08:16 AM   Modules accepted: Orders

## 2014-06-03 ENCOUNTER — Encounter: Payer: Self-pay | Admitting: *Deleted

## 2014-06-17 ENCOUNTER — Encounter: Payer: Self-pay | Admitting: Physician Assistant

## 2014-06-18 ENCOUNTER — Ambulatory Visit (INDEPENDENT_AMBULATORY_CARE_PROVIDER_SITE_OTHER): Payer: BC Managed Care – PPO | Admitting: *Deleted

## 2014-06-18 DIAGNOSIS — H6122 Impacted cerumen, left ear: Secondary | ICD-10-CM

## 2014-06-18 NOTE — Progress Notes (Signed)
Patient ID: Beth [name], female   DOB: 11/08/1955, 59 y.o.   MRN: [id number] Patient presents for Ear Lavage unilat. left side d/t cerumen impaction.  Successful removal of cerumen impaction from left ear canal.

## 2014-07-05 ENCOUNTER — Other Ambulatory Visit: Payer: Self-pay | Admitting: *Deleted

## 2014-07-05 ENCOUNTER — Ambulatory Visit (INDEPENDENT_AMBULATORY_CARE_PROVIDER_SITE_OTHER): Payer: BC Managed Care – PPO | Admitting: Physician Assistant

## 2014-07-05 DIAGNOSIS — N390 Urinary tract infection, site not specified: Secondary | ICD-10-CM

## 2014-07-06 LAB — URINALYSIS, ROUTINE W REFLEX MICROSCOPIC
Bilirubin Urine: NEGATIVE
Glucose, UA: NEGATIVE mg/dL
Hgb urine dipstick: NEGATIVE
Ketones, ur: NEGATIVE mg/dL
Leukocytes, UA: NEGATIVE
Nitrite: NEGATIVE
Protein, ur: NEGATIVE mg/dL
Specific Gravity, Urine: 1.023 (ref 1.005–1.030)
Urobilinogen, UA: 0.2 mg/dL (ref 0.0–1.0)
pH: 6 (ref 5.0–8.0)

## 2014-07-07 LAB — URINE CULTURE: Colony Count: >=100000

## 2014-07-08 ENCOUNTER — Other Ambulatory Visit: Payer: Self-pay

## 2014-07-08 DIAGNOSIS — Z1231 Encounter for screening mammogram for malignant neoplasm of breast: Secondary | ICD-10-CM

## 2014-07-08 MED ORDER — NITROFURANTOIN MONOHYD MACRO 100 MG PO CAPS: 100.0000 mg | ORAL_CAPSULE | Freq: Two times a day (BID) | ORAL | Status: AC

## 2014-07-08 NOTE — Progress Notes (Signed)
UTI

## 2014-07-10 ENCOUNTER — Ambulatory Visit: Payer: BC Managed Care – PPO

## 2014-07-10 ENCOUNTER — Ambulatory Visit
Admission: RE | Admit: 2014-07-10 | Discharge: 2014-07-10 | Disposition: A | Discharge status: Home / Self Care | Payer: BC Managed Care – PPO | Source: Ambulatory Visit

## 2014-07-10 ENCOUNTER — Other Ambulatory Visit: Payer: Self-pay

## 2014-07-10 DIAGNOSIS — Z1231 Encounter for screening mammogram for malignant neoplasm of breast: Secondary | ICD-10-CM

## 2014-07-14 ENCOUNTER — Other Ambulatory Visit: Payer: Self-pay | Admitting: Internal Medicine

## 2014-07-17 ENCOUNTER — Encounter: Payer: Self-pay | Admitting: Physician Assistant

## 2014-08-05 ENCOUNTER — Other Ambulatory Visit: Payer: Self-pay | Admitting: Emergency Medicine

## 2014-08-08 ENCOUNTER — Other Ambulatory Visit: Payer: Self-pay | Admitting: Physician Assistant

## 2014-11-04 ENCOUNTER — Other Ambulatory Visit: Payer: Self-pay | Admitting: Physician Assistant

## 2014-11-04 DIAGNOSIS — F419 Anxiety disorder, unspecified: Secondary | ICD-10-CM

## 2014-11-21 ENCOUNTER — Encounter: Payer: Self-pay | Admitting: Physician Assistant

## 2014-11-21 ENCOUNTER — Other Ambulatory Visit: Payer: Self-pay | Admitting: Physician Assistant

## 2014-11-21 ENCOUNTER — Ambulatory Visit (INDEPENDENT_AMBULATORY_CARE_PROVIDER_SITE_OTHER): Payer: BC Managed Care – PPO | Admitting: Physician Assistant

## 2014-11-21 VITALS — BP 128/72 | HR 60 | Temp 97.7°F | Resp 16 | Ht 61.0 in | Wt 173.0 lb

## 2014-11-21 DIAGNOSIS — E785 Hyperlipidemia, unspecified: Secondary | ICD-10-CM

## 2014-11-21 DIAGNOSIS — E669 Obesity, unspecified: Secondary | ICD-10-CM

## 2014-11-21 DIAGNOSIS — N393 Stress incontinence (female) (male): Secondary | ICD-10-CM

## 2014-11-21 DIAGNOSIS — Z0000 Encounter for general adult medical examination without abnormal findings: Secondary | ICD-10-CM

## 2014-11-21 DIAGNOSIS — Z79899 Other long term (current) drug therapy: Secondary | ICD-10-CM

## 2014-11-21 DIAGNOSIS — R8290 Unspecified abnormal findings in urine: Secondary | ICD-10-CM

## 2014-11-21 DIAGNOSIS — R7303 Prediabetes: Secondary | ICD-10-CM

## 2014-11-21 DIAGNOSIS — Z9109 Other allergy status, other than to drugs and biological substances: Secondary | ICD-10-CM

## 2014-11-21 DIAGNOSIS — N390 Urinary tract infection, site not specified: Secondary | ICD-10-CM

## 2014-11-21 DIAGNOSIS — F325 Major depressive disorder, single episode, in full remission: Secondary | ICD-10-CM

## 2014-11-21 DIAGNOSIS — I10 Essential (primary) hypertension: Secondary | ICD-10-CM

## 2014-11-21 DIAGNOSIS — M329 Systemic lupus erythematosus, unspecified: Secondary | ICD-10-CM

## 2014-11-21 DIAGNOSIS — K5900 Constipation, unspecified: Secondary | ICD-10-CM

## 2014-11-21 DIAGNOSIS — E559 Vitamin D deficiency, unspecified: Secondary | ICD-10-CM

## 2014-11-21 LAB — HEMOGLOBIN A1C
Hgb A1c MFr Bld: 6.1 % — ABNORMAL HIGH (ref <5.7)
Mean Plasma Glucose: 128 mg/dL — ABNORMAL HIGH (ref <117)

## 2014-11-21 LAB — CBC WITH DIFFERENTIAL/PLATELET
Basophils Absolute: 0 10*3/uL (ref 0.0–0.1)
Basophils Relative: 0 % (ref 0–1)
Eosinophils Absolute: 0.1 10*3/uL (ref 0.0–0.7)
Eosinophils Relative: 2 % (ref 0–5)
HCT: 40.3 % (ref 36.0–46.0)
Hemoglobin: 13.4 g/dL (ref 12.0–15.0)
Lymphocytes Relative: 46 % (ref 12–46)
Lymphs Abs: 1.7 10*3/uL (ref 0.7–4.0)
MCH: 29.5 pg (ref 26.0–34.0)
MCHC: 33.3 g/dL (ref 30.0–36.0)
MCV: 88.6 fL (ref 78.0–100.0)
MPV: 9 fL (ref 8.6–12.4)
Monocytes Absolute: 0.4 10*3/uL (ref 0.1–1.0)
Monocytes Relative: 11 % (ref 3–12)
Neutro Abs: 1.6 10*3/uL — ABNORMAL LOW (ref 1.7–7.7)
Neutrophils Relative %: 41 % — ABNORMAL LOW (ref 43–77)
Platelets: 256 10*3/uL (ref 150–400)
RBC: 4.55 MIL/uL (ref 3.87–5.11)
RDW: 14.1 % (ref 11.5–15.5)
WBC: 3.8 10*3/uL — ABNORMAL LOW (ref 4.0–10.5)

## 2014-11-21 NOTE — Progress Notes (Signed)
Complete Physical  Assessment and Plan: 1. Essential hypertension - continue medications, DASH diet, exercise and monitor at home. Call if greater than 130/80.  - CBC with Differential/Platelet - BASIC METABOLIC PANEL WITH GFR - Hepatic function panel - TSH - Urinalysis, Routine w reflex microscopic (not at [facility]) - Microalbumin / creatinine urine ratio - EKG 12-Lead  2. Prediabetes Discussed general issues about diabetes pathophysiology and management., Educational material distributed., Suggested low cholesterol diet., Encouraged aerobic exercise., Discussed foot care., Reminded to get yearly retinal exam. - Hemoglobin A1c - Insulin, fasting - LOW EXTREMITY NEUR EXAM DOCUM  3. Obesity Obesity with co morbidities- long discussion about weight loss, diet, and exercise  4. Hyperlipidemia -continue medications, check lipids, decrease fatty foods, increase activity.  - Lipid panel  5. Depression, major, in remission Continue medications  6. Medication management - Magnesium  7. Vitamin D deficiency - Vit D  25 hydroxy (rtn osteoporosis monitoring)  8. SLE Continue follow up, send [name] labs.   9. Stress incontinence Follow up Dr. Skip Estimable  10. Constipation, unspecified constipation type Continue benefiber, water, exercise  11. Environmental allergies controlled  12. Routine general medical examination at a health care facility   Discussed med's effects and SE's. Screening labs and tests as requested with regular follow-up as recommended.  HPI Patient presents for complete physical.   Patient's blood pressure has been controlled at home. Patient denies chest pain, shortness of breath, dizziness. BP: 128/72 mmHg  Patient's cholesterol is diet controlled. The patient's cholesterol last visit was  Lab Results  Component Value Date   CHOL 180 05/28/2014   HDL 27* 05/28/2014   LDLCALC 119* 05/28/2014   TRIG 171* 05/28/2014   CHOLHDL 6.7 05/28/2014   The  patient has been working on diet and exercise for prediabetes, denies changes in vision, polys, and paresthesias. Last A1C in office was Lab Results  Component Value Date   HGBA1C 6.0* 05/28/2014   Vitamin D deficiency, on vitamin d Lab Results  Component Value Date   VD25OH 61 05/28/2014   Patient has depression on celexa and wellbutrin. She is on 1/2 xanax AM and PM. She has SLE and follows with [name], on plaquenil.  BMI is Body mass index is 32.7 kg/(m^2)., she is working on diet and exercise, she is doing walking and an exercise class at [employer].  Wt Readings from Last 3 Encounters:  11/21/14 173 lb (78.472 kg)  05/28/14 180 lb (81.647 kg)  11/19/13 177 lb (80.287 kg)    Current Medications:  Current Outpatient Prescriptions on File Prior to Visit  Medication Sig Dispense Refill  . ALPRAZolam (XANAX) 0.5 MG tablet TAKE 1/2-1 TABLET BY MOUTH 3 TIMES A DAY AS NEEDED 90 tablet 0  . buPROPion (WELLBUTRIN XL) 150 MG 24 hr tablet TAKE 1 TABLET (150 MG TOTAL) BY MOUTH EVERY MORNING. 30 tablet 3  . Cholecalciferol (VITAMIN D) 2000 UNITS CAPS Take 1 capsule by mouth daily.    . citalopram (CELEXA) 40 MG tablet TAKE 1 TABLET DAILY FOR MOOD 90 tablet 1  . hydroxychloroquine (PLAQUENIL) 200 MG tablet TAKE 1 TABLET EVERY DAY 90 tablet 1  . Magnesium 500 MG CAPS Take by mouth.     No current facility-administered medications on file prior to visit.   Health Maintenance:   Immunization History  Administered Date(s) Administered  . Td 05/19/2003  . Tdap 05/23/2013   TDAP: 2015 Pneumovax: declines Flu vaccine: declines Prevnar: N/A Zostavax: N/A Pap: 2014 neg Luciana Axe, NP  MGM: 06/2014- very dense breast- category C- neg DEXA: 2007 normal Colonoscopy: Dr. Alice Reichert in [place] 10/2013 EGD:N/A CT head 2005 CXR 2005 MRI Lumbar spine 2007 Echo 2005 DEE 2014  Patient Care Team: [name], MD as PCP - General (Internal Medicine) Signe Colt, MD as Referring  Physician (Obstetrics and Gynecology) Kristeen Miss, MD as Consulting Physician (Neurosurgery) Leigh Aurora, MD as Consulting Physician (Rheumatology) Olean Ree, MD as Referring Physician (Internal Medicine) Ara Kussmaul, MD as Consulting Physician (Ophthalmology)   Allergies:  Allergies  Allergen Reactions  . Bactrim [Sulfamethoxazole-Trimethoprim]     GI upset  . Cymbalta [Duloxetine Hcl]     Sweating   Medical History:  Past Medical History  Diagnosis Date  . Lupus   . Arthritis   . Depression   . Hypertension     Labile  . Cystocele   . Prediabetes   . Seizure   . Vitamin D deficiency   . Hyperlipidemia    Surgical History:  Past Surgical History  Procedure Laterality Date  . Abdominal hysterectomy    . Cesarean section     Family History:  Family History  Problem Relation Age of Onset  . Hyperlipidemia Mother   . Hypertension Mother   . Heart disease Mother    Social History:  History   Social History  . Marital Status: Single    Spouse Name: N/A  . Number of Children: N/A  . Years of Education: N/A   Social History Main Topics  . Smoking status: Never Smoker   . Smokeless tobacco: Never Used  . Alcohol Use: No  . Drug Use: No  . Sexual Activity: No   Other Topics Concern  . None   Social History Narrative   Review of Systems  Constitutional: Negative.   HENT: Negative.   Eyes: Negative.   Respiratory: Negative.   Cardiovascular: Negative.   Gastrointestinal: Positive for constipation (better with exercise, benefiber, and water). Negative for heartburn, nausea, vomiting, abdominal pain, diarrhea, blood in stool and melena.  Genitourinary: Negative.   Musculoskeletal: Positive for joint pain (right knee pain, feels that her knees are weak that causes imbalance, declines PT, has been better with exericse. ). Negative for myalgias, back pain, falls and neck pain.  Skin: Negative.   Neurological: Negative.  Negative for dizziness.   Endo/Heme/Allergies: Negative.   Psychiatric/Behavioral: Negative.     Physical Exam: Estimated body mass index is 32.7 kg/(m^2) as calculated from the following:   Height as of this encounter: 5\' 1"  (1.549 m).   Weight as of this encounter: 173 lb (78.472 kg). Filed Vitals:   11/21/14 0952  BP: 128/72  Pulse: 60  Temp: 97.7 F (36.5 C)  Resp: 16   General Appearance: Well nourished, in no apparent distress. Eyes: PERRLA, EOMs, conjunctiva no swelling or erythema, normal fundi and vessels. Sinuses: No Frontal/maxillary tenderness ENT/Mouth: Ext aud canals clear, normal light reflex with TMs without erythema, bulging.  Good dentition. No erythema, swelling, or exudate on post pharynx. Tonsils not swollen or erythematous. Hearing normal. Crowded mouth.  Neck: Supple, thyroid normal. No bruits Respiratory: Respiratory effort normal, BS equal bilaterally without rales, rhonchi, wheezing or stridor. Cardio: RRR without murmurs, rubs or gallops. Brisk peripheral pulses without edema.  Chest: symmetric, with normal excursions and percussion. Breasts: defer Abdomen: Soft, obese +BS. Non tender, no guarding, rebound, hernias, masses, or organomegaly. .  Lymphatics: Non tender without lymphadenopathy.  Genitourinary: defer Musculoskeletal: Full ROM all peripheral extremities,5/5 strength, and normal  gait. Skin: Warm, dry without rashes, lesions, ecchymosis.  Neuro: Cranial nerves intact, reflexes equal bilaterally. Normal muscle tone, no cerebellar symptoms. Sensation intact.  Psych: Awake and oriented X 3, normal affect, Insight and Judgment appropriate.   EKG: WNL no changes.    Vicie Mutters 10:03 AM

## 2014-11-21 NOTE — Patient Instructions (Signed)
About Cystocele  Overview  The pelvic organs, including the bladder, are normally supported by pelvic floor muscles and ligaments.  When these muscles and ligaments are stretched, weakened or torn, the wall between the bladder and the vagina sags or herniates causing a prolapse, sometimes called a cystocele.  This condition may cause discomfort and problems with emptying the bladder.  It can be present in various stages.  Some people are not aware of the changes.  Others may notice changes at the vaginal opening or a feeling of the bladder dropping outside the body.  Causes of a Cystocele  A cystocele is usually caused by muscle straining or stretching during childbirth.  In addition, cystocele is more common after menopause, because the hormone estrogen helps keep the elastic tissues around the pelvic organs strong.  A cystocele is more likely to occur when levels of estrogen decrease.  Other causes include: heavy lifting, chronic coughing, previous pelvic surgery and obesity.  Symptoms  A bladder that has dropped from its normal position may cause: unwanted urine leakage (stress incontinence), frequent urination or urge to urinate, incomplete emptying of the bladder (not feeling bladder relief after emptying), pain or discomfort in the vagina, pelvis, groin, lower back or lower abdomen and frequent urinary tract infections.  Mild cases may not cause any symptoms.  Treatment Options  Pelvic floor (Kegel) exercises:  Strength training the muscles in your genital area  Behavioral changes: Treating and preventing constipation, taking time to empty your bladder properly, learning to lift properly and/or avoid heavy lifting when possible, stopping smoking, avoiding weight gain and treating a chronic cough or bronchitis.  A pessary: A vaginal support device is sometimes used to help pelvic support caused by muscle and ligament changes.  Surgery: Surgical repair may be necessary if symptoms cannot  be managed with exercise, behavioral changes and a pessary.  Surgery is usually considered for severe cases.   2007, [employer]  We want weight loss that will last so you should lose 1-2 pounds a week.  THAT IS IT! Please pick THREE things a month to change. Once it is a habit check off the item. Then pick another three items off the list to become habits.  If you are already doing a habit on the list GREAT!  Cross that item off! o Don't drink your calories. Ie, alcohol, soda, fruit juice, and sweet tea.  o Drink more water. Drink a glass when you feel hungry or before each meal.  o Eat breakfast - Complex carb and protein (likeDannon light and fit yogurt, oatmeal, fruit, eggs, [place] bacon). o Measure your cereal.  Eat no more than one cup a day. (ie [place]) o Eat an apple a day. o Add a vegetable a day. o Try a new vegetable a month. o Use Pam! Stop using oil or butter to cook. o Don't finish your plate or use smaller plates. o Share your dessert. o Eat sugar free Jello for dessert or frozen grapes. o Don't eat 2-3 hours before bed. o Switch to whole wheat bread, pasta, and brown rice. o Make healthier choices when you eat out. No fries! o Pick baked chicken, NOT fried. o Don't forget to SLOW DOWN when you eat. It is not going anywhere.  o Take the stairs. o Park far away in the parking lot o [employer] (or weights) for 10 minutes while watching TV. o Walk at work for 10 minutes during break. o Walk outside 1 time a week  with your friend, kids, dog, or significant other. o Start a walking group at [employer] as much as you can tolerate.  o Keep a food diary. o Weigh yourself daily. o Walk for 15 minutes 3 days per week. o Cook at home more often and eat out less.  If life happens and you go back to old habits, it is okay.  Just start over. You can do it!   If you experience chest pain, get short of breath, or tired during the exercise, please stop  immediately and inform your doctor.    Before you even begin to attack a weight-loss plan, it pays to remember this: You are not fat. You have fat. Losing weight isn't about blame or shame; it's simply another achievement to accomplish. Dieting is like any other skill-you have to buckle down and work at it. As long as you act in a smart, reasonable way, you'll ultimately get where you want to be. Here are some weight loss pearls for you.  1. It's Not a Diet. It's a Lifestyle Thinking of a diet as something you're on and suffering through only for the short term doesn't work. To shed weight and keep it off, you need to make permanent changes to the way you eat. It's OK to indulge occasionally, of course, but if you cut calories temporarily and then revert to your old way of eating, you'll gain back the weight quicker than you can say yo-yo. Use it to lose it. Research shows that one of the best predictors of long-term weight loss is how many pounds you drop in the first month. For that reason, nutritionists often suggest being stricter for the first two weeks of your new eating strategy to build momentum. Cut out added sugar and alcohol and avoid unrefined carbs. After that, figure out how you can reincorporate them in a way that's healthy and maintainable.  2. There's a Right Way to Exercise Working out burns calories and fat and boosts your metabolism by building muscle. But those trying to lose weight are notorious for overestimating the number of calories they burn and underestimating the amount they take in. Unfortunately, your system is biologically programmed to hold on to extra pounds and that means when you start exercising, your body senses the deficit and ramps up its hunger signals. If you're not diligent, you'll eat everything you burn and then some. Use it to lose it. Cardio gets all the exercise glory, but strength and interval training are the real heroes. They help you build lean muscle,  which in turn increases your metabolism and calorie-burning ability 3. Don't Overreact to Mild Hunger Some people have a hard time losing weight because of hunger anxiety. To them, being hungry is bad-something to be avoided at all costs-so they carry snacks with them and eat when they don't need to. Others eat because they're stressed out or bored. While you never want to get to the point of being ravenous (that's when bingeing is likely to happen), a hunger pang, a craving, or the fact that it's 3:00 p.m. should not send you racing for the vending machine or obsessing about the energy bar in your purse. Ideally, you should put off eating until your stomach is growling and it's difficult to concentrate.  Use it to lose it. When you feel the urge to eat, use the HALT method. Ask yourself, Am I really hungry? Or am I angry or anxious, lonely or bored, or tired?  If you're still not certain, try the apple test. If you're truly hungry, an apple should seem delicious; if it doesn't, something else is going on. Or you can try drinking water and making yourself busy, if you are still hungry try a healthy snack.  4. Not All Calories Are Created Equal The mechanics of weight loss are pretty simple: Take in fewer calories than you use for energy. But the kind of food you eat makes all the difference. Processed food that's high in saturated fat and refined starch or sugar can cause inflammation that disrupts the hormone signals that tell your brain you're full. The result: You eat a lot more.  Use it to lose it. Clean up your diet. Swap in whole, unprocessed foods, including vegetables, lean protein, and healthy fats that will fill you up and give you the biggest nutritional bang for your calorie buck. In a few weeks, as your brain starts receiving regular hunger and fullness signals once again, you'll notice that you feel less hungry overall and naturally start cutting back on the amount you eat.  5. Protein, Produce,  and Plant-Based Fats Are Your Weight-Loss Trinity Here's why eating the three Ps regularly will help you drop pounds. Protein fills you up. You need it to build lean muscle, which keeps your metabolism humming so that you can torch more fat. People in a weight-loss program who ate double the recommended daily allowance for protein (about 110 grams for a 150-pound woman) lost 70 percent of their weight from fat, while people who ate the RDA lost only about 40 percent, one study found. Produce is packed with filling fiber. "It's very difficult to consume too many calories if you're eating a lot of vegetables. Example: Three cups of broccoli is a lot of food, yet only 93 calories. (Fruit is another story. It can be easy to overeat and can contain a lot of calories from sugar, so be sure to monitor your intake.) Plant-based fats like olive oil and those in avocados and nuts are healthy and extra satiating.  Use it to lose it. Aim to incorporate each of the three Ps into every meal and snack. People who eat protein throughout the day are able to keep weight off, according to a study in the [place] of Clinical Nutrition. In addition to meat, poultry and seafood, good sources are beans, lentils, eggs, tofu, and yogurt. As for fat, keep portion sizes in check by measuring out salad dressing, oil, and nut butters (shoot for one to two tablespoons). Finally, eat veggies or a little fruit at every meal. People who did that consumed 308 fewer calories but didn't feel any hungrier than when they didn't eat more produce.  7. How You Eat Is As Important As What You Eat In order for your brain to register that you're full, you need to focus on what you're eating. Sit down whenever you eat, preferably at a table. Turn off the TV or computer, put down your phone, and look at your food. Smell it. Chew slowly, and don't put another bite on your fork until you swallow. When women ate lunch this attentively, they  consumed 30 percent less when snacking later than those who listened to an audiobook at lunchtime, according to a study in the [place] of Nutrition. 8. Weighing Yourself Really Works The scale provides the best evidence about whether your efforts are paying off. Seeing the numbers tick up or down or stagnate is motivation to keep going-or  to rethink your approach. A 2015 study at [facility] found that daily weigh-ins helped people lose more weight, keep it off, and maintain that loss, even after two years. Use it to lose it. Step on the scale at the same time every day for the best results. If your weight shoots up several pounds from one weigh-in to the next, don't freak out. Eating a lot of salt the night before or having your period is the likely culprit. The number should return to normal in a day or two. It's a steady climb that you need to do something about. 9. Too Much Stress and Too Little Sleep Are Your Enemies When you're tired and frazzled, your body cranks up the production of cortisol, the stress hormone that can cause carb cravings. Not getting enough sleep also boosts your levels of ghrelin, a hormone associated with hunger, while suppressing leptin, a hormone that signals fullness and satiety. People on a diet who slept only five and a half hours a night for two weeks lost 55 percent less fat and were hungrier than those who slept eight and a half hours, according to a study in the [place]. Use it to lose it. Prioritize sleep, aiming for seven hours or more a night, which research shows helps lower stress. And make sure you're getting quality zzz's. If a snoring spouse or a fidgety cat wakes you up frequently throughout the night, you may end up getting the equivalent of just four hours of sleep, according to a study from [facility]. Keep pets out of the bedroom, and use a white-noise app to drown out snoring. 10. You Will Hit a  plateau-And You Can Bust Through It As you slim down, your body releases much less leptin, the fullness hormone.  If you're not strength training, start right now. Building muscle can raise your metabolism to help you overcome a plateau. To keep your body challenged and burning calories, incorporate new moves and more intense intervals into your workouts or add another sweat session to your weekly routine. Alternatively, cut an extra 100 calories or so a day from your diet. Now that you've lost weight, your body simply doesn't need as much fuel.   Ways to cut 100 calories  1. Eat your eggs with hot sauce OR salsa instead of cheese.  Eggs are great for breakfast, but many people consider eggs and cheese to be BFFs. Instead of cheese-1 oz. of cheddar has 114 calories-top your eggs with hot sauce, which contains no calories and helps with satiety and metabolism. Salsa is also a great option!!  2. Top your toast, waffles or pancakes with mashed berries instead of jelly or syrup. Half a cup of berries-fresh, frozen or thawed-has about 40 calories, compared with 2 tbsp. of maple syrup or jelly, which both have about 100 calories. The berries will also give you a good punch of fiber, which helps keep you full and satisfied and won't spike blood sugar quickly like the jelly or syrup. 3. Swap the non-fat latte for black coffee with a splash of half-and-half. Contrary to its name, that non-fat latte has 130 calories and a startling 19g of carbohydrates per 16 oz. serving. Replacing that 'light' drinkable dessert with a black coffee with a splash of half-and-half saves you more than 100 calories per 16 oz. serving. 4. Sprinkle salads with freeze-dried raspberries instead of dried cranberries. If you want a sweet addition to your nutritious salad, stay away from dried  cranberries. They have a whopping 130 calories per  cup and 30g carbohydrates. Instead, sprinkle freeze-dried raspberries guilt-free and save more  than 100 calories per  cup serving, adding 3g of belly-filling fiber. 5. Go for mustard in place of mayo on your sandwich. Mustard can add really nice flavor to any sandwich, and there are tons of varieties, from spicy to honey. A serving of mayo is 95 calories, versus 10 calories in a serving of mustard. 6. Choose a DIY salad dressing instead of the store-bought kind. Mix Dijon or whole grain mustard with low-fat Kefir or red wine vinegar and garlic. 7. Use hummus as a spread instead of a dip. Use hummus as a spread on a high-fiber cracker or tortilla with a sandwich and save on calories without sacrificing taste. 8. Pick just one salad "accessory." Salad isn't automatically a calorie winner. It's easy to over-accessorize with toppings. Instead of topping your salad with nuts, avocado and cranberries (all three will clock in at 313 calories), just pick one. The next day, choose a different accessory, which will also keep your salad interesting. You don't wear all your jewelry every day, right? 9. Ditch the white pasta in favor of spaghetti squash. One cup of cooked spaghetti squash has about 40 calories, compared with traditional spaghetti, which comes with more than 200. Spaghetti squash is also nutrient-dense. It's a good source of fiber and Vitamins A and C, and it can be eaten just like you would eat pasta-with a great tomato sauce and [place] meatballs or with pesto, tofu and spinach, for example. 10. Dress up your chili, soups and stews with non-fat [place] yogurt instead of sour cream. Just a 'dollop' of sour cream can set you back 115 calories and a whopping 12g of fat-seven of which are of the artery-clogging variety. Added bonus: [place] yogurt is packed with muscle-building protein, calcium and B Vitamins. 11. Mash cauliflower instead of mashed potatoes. One cup of traditional mashed potatoes-in all their creamy goodness-has more than 200 calories, compared to mashed cauliflower, which you can  typically eat for less than 100 calories per 1 cup serving. Cauliflower is a great source of the antioxidant indole-3-carbinol (I3C), which may help reduce the risk of some cancers, like breast cancer. 12. Ditch the ice cream sundae in favor of a [place] yogurt parfait. Instead of a cup of ice cream or fro-yo for dessert, try 1 cup of nonfat Greek yogurt topped with fresh berries and a sprinkle of cacao nibs. Both toppings are packed with antioxidants, which can help reduce cellular inflammation and oxidative damage. And the comparison is a no-brainer: One cup of ice cream has about 275 calories; one cup of frozen yogurt has about 230; and a cup of Greek yogurt has just 130, plus twice the protein, so you're less likely to return to the freezer for a second helping. 13. Put olive oil in a spray container instead of using it directly from the bottle. Each tablespoon of olive oil is 120 calories and 15g of fat. Use a mister instead of pouring it straight into the pan or onto a salad. This allows for portion control and will save you more than 100 calories. 14. When baking, substitute canned pumpkin for butter or oil. Canned pumpkin-not pumpkin pie mix-is loaded with Vitamin A, which is important for skin and eye health, as well as immunity. And the comparisons are pretty crazy:  cup of canned pumpkin has about 40 calories, compared to butter or oil, which has  more than 800 calories. Yes, 800 calories. Applesauce and mashed banana can also serve as good substitutions for butter or oil, usually in a 1:1 ratio. 15. Top casseroles with high-fiber cereal instead of breadcrumbs. Breadcrumbs are typically made with white bread, while breakfast cereals contain 5-9g of fiber per serving. Not only will you save more than 150 calories per  cup serving, the swap will also keep you more full and you'll get a metabolism boost from the added fiber. 16. Snack on pistachios instead of macadamia nuts. Believe it or not, you  get the same amount of calories from 35 pistachios (100 calories) as you would from only five macadamia nuts. 17. Chow down on kale chips rather than potato chips. This is my favorite 'don't knock it 'till you try it' swap. Kale chips are so easy to make at home, and you can spice them up with a little grated parmesan or chili powder. Plus, they're a mere fraction of the calories of potato chips, but with the same crunch factor we crave so often. 18. Add seltzer and some fruit slices to your cocktail instead of soda or fruit juice. One cup of soda or fruit juice can pack on as much as 140 calories. Instead, use seltzer and fruit slices. The fruit provides valuable phytochemicals, such as flavonoids and anthocyanins, which help to combat cancer and stave off the aging process.

## 2014-11-22 LAB — LIPID PANEL
Cholesterol: 177 mg/dL (ref 0–200)
HDL: 41 mg/dL — ABNORMAL LOW (ref >=46)
LDL Cholesterol: 115 mg/dL — ABNORMAL HIGH (ref 0–99)
Total CHOL/HDL Ratio: 4.3 Ratio
Triglycerides: 105 mg/dL (ref <150)
VLDL: 21 mg/dL (ref 0–40)

## 2014-11-22 LAB — TSH: TSH: 1.321 u[IU]/mL (ref 0.350–4.500)

## 2014-11-22 LAB — URINALYSIS, MICROSCOPIC ONLY: Casts: NONE SEEN

## 2014-11-22 LAB — BASIC METABOLIC PANEL WITH GFR
BUN: 15 mg/dL (ref 6–23)
CO2: 24 mEq/L (ref 19–32)
Calcium: 9.5 mg/dL (ref 8.4–10.5)
Chloride: 104 mEq/L (ref 96–112)
Creat: 0.8 mg/dL (ref 0.50–1.10)
GFR, Est African American: >89 mL/min
GFR, Est Non African American: 81 mL/min
Glucose, Bld: 79 mg/dL (ref 70–99)
Potassium: 4.3 mEq/L (ref 3.5–5.3)
Sodium: 139 mEq/L (ref 135–145)

## 2014-11-22 LAB — HEPATIC FUNCTION PANEL
ALT: 13 U/L (ref 0–35)
AST: 17 U/L (ref 0–37)
Albumin: 4 g/dL (ref 3.5–5.2)
Alkaline Phosphatase: 43 U/L (ref 39–117)
Bilirubin, Direct: 0.1 mg/dL (ref 0.0–0.3)
Indirect Bilirubin: 0.4 mg/dL (ref 0.2–1.2)
Total Bilirubin: 0.5 mg/dL (ref 0.2–1.2)
Total Protein: 7.5 g/dL (ref 6.0–8.3)

## 2014-11-22 LAB — URINALYSIS, ROUTINE W REFLEX MICROSCOPIC
Bilirubin Urine: NEGATIVE
Glucose, UA: 100 mg/dL — AB
Hgb urine dipstick: NEGATIVE
Ketones, ur: NEGATIVE mg/dL
Nitrite: NEGATIVE
Protein, ur: 30 mg/dL — AB
Specific Gravity, Urine: 1.019 (ref 1.005–1.030)
Urobilinogen, UA: 1 mg/dL (ref 0.0–1.0)
pH: 8 (ref 5.0–8.0)

## 2014-11-22 LAB — INSULIN, FASTING: Insulin fasting, serum: 9.6 u[IU]/mL (ref 2.0–19.6)

## 2014-11-22 LAB — MICROALBUMIN / CREATININE URINE RATIO
Creatinine, Urine: 114.1 mg/dL
Microalb Creat Ratio: 6.1 mg/g (ref 0.0–30.0)
Microalb, Ur: 0.7 mg/dL (ref <2.0)

## 2014-11-22 LAB — VITAMIN D 25 HYDROXY (VIT D DEFICIENCY, FRACTURES): Vit D, 25-Hydroxy: 71 ng/mL (ref 30–100)

## 2014-11-22 LAB — MAGNESIUM: Magnesium: 1.9 mg/dL (ref 1.5–2.5)

## 2014-11-25 ENCOUNTER — Encounter: Payer: Self-pay | Admitting: Physician Assistant

## 2014-11-27 LAB — URINE CULTURE: Colony Count: 40000

## 2014-11-27 MED ORDER — CIPROFLOXACIN HCL 500 MG PO TABS: 500.0000 mg | ORAL_TABLET | Freq: Two times a day (BID) | ORAL | Status: DC

## 2014-11-27 NOTE — Addendum Note (Signed)
Addended by: Vicie Mutters R on: 11/27/2014 10:56 AM   Modules accepted: Orders

## 2014-12-10 ENCOUNTER — Other Ambulatory Visit: Payer: Self-pay | Admitting: Internal Medicine

## 2014-12-22 ENCOUNTER — Other Ambulatory Visit: Payer: Self-pay | Admitting: Internal Medicine

## 2014-12-27 ENCOUNTER — Other Ambulatory Visit: Payer: Self-pay

## 2015-01-01 ENCOUNTER — Other Ambulatory Visit (INDEPENDENT_AMBULATORY_CARE_PROVIDER_SITE_OTHER): Payer: BC Managed Care – PPO

## 2015-01-01 DIAGNOSIS — N390 Urinary tract infection, site not specified: Secondary | ICD-10-CM | POA: Diagnosis not present

## 2015-01-01 DIAGNOSIS — R8290 Unspecified abnormal findings in urine: Secondary | ICD-10-CM

## 2015-01-02 LAB — URINALYSIS, ROUTINE W REFLEX MICROSCOPIC
Bilirubin Urine: NEGATIVE
Glucose, UA: NEGATIVE
Hgb urine dipstick: NEGATIVE
Ketones, ur: NEGATIVE
Leukocytes, UA: NEGATIVE
Nitrite: NEGATIVE
Protein, ur: NEGATIVE
Specific Gravity, Urine: 1.018 (ref 1.001–1.035)
pH: 7 (ref 5.0–8.0)

## 2015-01-04 LAB — URINE CULTURE: Colony Count: >=100000

## 2015-01-21 ENCOUNTER — Encounter: Payer: Self-pay | Admitting: Physician Assistant

## 2015-01-21 NOTE — Telephone Encounter (Signed)
Spoke with patient and apologized for the delay in getting the results and explained that we never received the sleep study results, but I able to track the study from 05-2014 down. Advised patient that [facility] will call to schedule fitting for CPAP, and scheduled office visit with Vicie Mutters to review sleep study results.  Thank you, Leonie Douglas Referral Coordinator  [facility] [phone] ext. [phone] Fax [phone]

## 2015-01-22 ENCOUNTER — Encounter: Payer: Self-pay | Admitting: Physician Assistant

## 2015-01-24 ENCOUNTER — Ambulatory Visit (INDEPENDENT_AMBULATORY_CARE_PROVIDER_SITE_OTHER): Payer: BC Managed Care – PPO | Admitting: Physician Assistant

## 2015-01-24 ENCOUNTER — Encounter: Payer: Self-pay | Admitting: Physician Assistant

## 2015-01-24 VITALS — BP 120/74 | HR 64 | Temp 97.3°F | Resp 18 | Ht 61.0 in | Wt 173.2 lb

## 2015-01-24 DIAGNOSIS — G2581 Restless legs syndrome: Secondary | ICD-10-CM | POA: Diagnosis not present

## 2015-01-24 DIAGNOSIS — E669 Obesity, unspecified: Secondary | ICD-10-CM | POA: Diagnosis not present

## 2015-01-24 DIAGNOSIS — G4733 Obstructive sleep apnea (adult) (pediatric): Secondary | ICD-10-CM

## 2015-01-24 HISTORY — DX: Restless legs syndrome: G25.81

## 2015-01-24 MED ORDER — ROPINIROLE HCL 0.5 MG PO TABS: ORAL_TABLET | ORAL | Status: DC

## 2015-01-24 NOTE — Progress Notes (Signed)
Assessment and Plan: Obesity- long discussion about weight loss, diet, and exercise RLS- will try requip low dose.  OSA- weight loss advised, suggest trying to save money/payment plan to get on CPAP  Future Appointments Date Time Provider [place]  02/24/2015 8:45 AM Vicie Mutters, PA-C [facility] None  11/24/2015 10:00 AM Vicie Mutters, PA-C [facility] None     HPI 59 y.o.female presents for follow up on sleep study. She did have positive sleep study back in Jan but due to Heart and sleep closing down we had to call several places to retrieve the records. We have referred her to get a CPAP however due to a high deductible the patient states she is unable to afford the machine and does not qualify for assistance. The sleep study also had high periodic limb movement for RLS.  BMI is Body mass index is 32.74 kg/(m^2)., she is working on diet and exercise. Wt Readings from Last 3 Encounters:  01/24/15 173 lb 3.2 oz (78.563 kg)  11/21/14 173 lb (78.472 kg)  05/28/14 180 lb (81.647 kg)    Past Medical History  Diagnosis Date  . Lupus   . Arthritis   . Depression   . Hypertension     Labile  . Cystocele   . Prediabetes   . Seizure   . Vitamin D deficiency   . Hyperlipidemia      Allergies  Allergen Reactions  . Bactrim [Sulfamethoxazole-Trimethoprim]     GI upset  . Cymbalta [Duloxetine Hcl]     Sweating      Current Outpatient Prescriptions on File Prior to Visit  Medication Sig Dispense Refill  . ALPRAZolam (XANAX) 0.5 MG tablet TAKE 1/2-1 TABLET BY MOUTH 3 TIMES A DAY AS NEEDED 90 tablet 0  . buPROPion (WELLBUTRIN XL) 150 MG 24 hr tablet TAKE 1 TABLET BY MOUTH EVERY MORNING 30 tablet 3  . Cholecalciferol (VITAMIN D) 2000 UNITS CAPS Take 1 capsule by mouth daily.    . ciprofloxacin (CIPRO) 500 MG tablet Take 1 tablet (500 mg total) by mouth 2 (two) times daily. 7 days 14 tablet 0  . citalopram (CELEXA) 40 MG tablet TAKE 1 TABLET DAILY FOR MOOD 90 tablet 1  .  hydroxychloroquine (PLAQUENIL) 200 MG tablet TAKE 1 TABLET EVERY DAY 90 tablet 1  . Magnesium 500 MG CAPS Take by mouth.     No current facility-administered medications on file prior to visit.    ROS: all negative except above.   Physical Exam: Filed Weights   01/24/15 1048  Weight: 173 lb 3.2 oz (78.563 kg)   BP 120/74 mmHg  Pulse 64  Temp(Src) 97.3 F (36.3 C)  Resp 18  Wt 173 lb 3.2 oz (78.563 kg) General Appearance: Well nourished, in no apparent distress. Eyes: PERRLA, EOMs, conjunctiva no swelling or erythema Sinuses: No Frontal/maxillary tenderness ENT/Mouth: Ext aud canals clear, TMs without erythema, bulging. No erythema, swelling, or exudate on post pharynx.  Tonsils not swollen or erythematous. Hearing normal.  Neck: Supple, thyroid normal.  Respiratory: Respiratory effort normal, BS equal bilaterally without rales, rhonchi, wheezing or stridor.  Cardio: RRR with no MRGs. Brisk peripheral pulses without edema.  Abdomen: Soft, + BS.  Non tender, no guarding, rebound, hernias, masses. Lymphatics: Non tender without lymphadenopathy.  Musculoskeletal: Full ROM, 5/5 strength, normal gait.  Skin: Warm, dry without rashes, lesions, ecchymosis.  Neuro: Cranial nerves intact. Normal muscle tone, no cerebellar symptoms. Sensation intact.  Psych: Awake and oriented X 3, normal affect, Insight and Judgment  appropriate.     Vicie Mutters, PA-C 10:48 AM [facility]

## 2015-01-24 NOTE — Patient Instructions (Signed)
You can try some of the at home treatments for Restless legs and we will check some labs on you looking for deficiencies that could contribute to it however we will have you start on :   Requip 0.25mg - start this dose about 1-2 hours BEFORE restless symptoms start.  The dose may be increased by 0.25 mg every two to three days until relief is obtained. Most patients can go up to 1 or 2 mg, and doses up to 4 mg may be needed. Common adverse side effects: usually mild, transient, and limited to nausea, lightheadedness, and fatigue; these usually resolve within 10 to 14 days. Less frequent side effects include nasal stuffiness, constipation, and leg edema; these are reversible if the medication is stopped.   Restless Legs Syndrome Restless legs syndrome is a movement disorder. It may also be called a sensorimotor disorder.  CAUSES  No one knows what specifically causes restless legs syndrome, but it tends to run in families. It is also more common in people with low iron, in pregnancy, in people who need dialysis, and those with nerve damage (neuropathy).Some medications may make restless legs syndrome worse.Those medications include drugs to treat high blood pressure, some heart conditions, nausea, colds, allergies, and depression. SYMPTOMS Symptoms include uncomfortable sensations in the legs. These leg sensations are worse during periods of inactivity or rest. They are also worse while sitting or lying down. Individuals that have the disorder describe sensations in the legs that feel like:  Pulling.  Drawing.  Crawling.  Worming.  Boring.  Tingling.  Pins and needles.  Prickling.  Pain. The sensations are usually accompanied by an overwhelming urge to move the legs. Sudden muscle jerks may also occur. Movement provides temporary relief from the discomfort. In rare cases, the arms may also be affected. Symptoms may interfere with going to sleep (sleep onset insomnia). Restless legs  syndrome may also be related to periodic limb movement disorder (PLMD). PLMD is another more common motor disorder. It also causes interrupted sleep. The symptoms from PLMD usually occur most often when you are awake. TREATMENT  Treatment for restless legs syndrome is symptomatic. This means that the symptoms are treated.   Massage and cold compresses may provide temporary relief.  Walk, stretch, or take a cold or hot bath.  Get regular exercise and a good night's sleep.  Avoid caffeine, alcohol, nicotine, and medications that can make it worse.  Do activities that provide mental stimulation like discussions, needlework, and video games. These may be helpful if you are not able to walk or stretch. Some medications are effective in relieving the symptoms. However, many of these medications have side effects. Ask your caregiver about medications that may help your symptoms. Correcting iron deficiency may improve symptoms for some patients. Document Released: 04/23/2002 Document Revised: 09/17/2013 Document Reviewed: 07/30/2010 [facility] Patient Information 2015 [place], [place]. This information is not intended to replace advice given to you by your health care provider. Make sure you discuss any questions you have with your health care provider.

## 2015-01-28 ENCOUNTER — Other Ambulatory Visit: Payer: Self-pay | Admitting: Internal Medicine

## 2015-01-28 NOTE — Telephone Encounter (Signed)
Rx called in @ [facility]

## 2015-01-29 ENCOUNTER — Other Ambulatory Visit: Payer: Self-pay | Admitting: Physician Assistant

## 2015-01-31 ENCOUNTER — Ambulatory Visit (INDEPENDENT_AMBULATORY_CARE_PROVIDER_SITE_OTHER): Payer: BC Managed Care – PPO

## 2015-01-31 DIAGNOSIS — N390 Urinary tract infection, site not specified: Secondary | ICD-10-CM

## 2015-02-01 LAB — URINALYSIS, ROUTINE W REFLEX MICROSCOPIC
Bilirubin Urine: NEGATIVE
Glucose, UA: NEGATIVE
Hgb urine dipstick: NEGATIVE
Ketones, ur: NEGATIVE
Nitrite: POSITIVE — AB
Protein, ur: NEGATIVE
Specific Gravity, Urine: 1.017 (ref 1.001–1.035)
pH: 7 (ref 5.0–8.0)

## 2015-02-01 LAB — URINALYSIS, MICROSCOPIC ONLY
Casts: NONE SEEN [LPF]
Crystals: NONE SEEN [HPF]
RBC / HPF: NONE SEEN RBC/HPF (ref <=2)
Yeast: NONE SEEN [HPF]

## 2015-02-02 LAB — URINE CULTURE: Colony Count: >=100000

## 2015-02-04 ENCOUNTER — Other Ambulatory Visit: Payer: Self-pay | Admitting: Internal Medicine

## 2015-02-04 ENCOUNTER — Encounter: Payer: Self-pay | Admitting: Physician Assistant

## 2015-02-04 DIAGNOSIS — N3090 Cystitis, unspecified without hematuria: Secondary | ICD-10-CM

## 2015-02-04 MED ORDER — CIPROFLOXACIN HCL 500 MG PO TABS: ORAL_TABLET | ORAL | Status: AC

## 2015-02-22 ENCOUNTER — Other Ambulatory Visit: Payer: Self-pay | Admitting: Physician Assistant

## 2015-02-24 ENCOUNTER — Encounter: Payer: Self-pay | Admitting: Physician Assistant

## 2015-02-24 ENCOUNTER — Ambulatory Visit (INDEPENDENT_AMBULATORY_CARE_PROVIDER_SITE_OTHER): Payer: BC Managed Care – PPO | Admitting: Physician Assistant

## 2015-02-24 VITALS — BP 108/60 | HR 75 | Temp 97.7°F | Resp 14 | Ht 61.0 in | Wt 176.0 lb

## 2015-02-24 DIAGNOSIS — I10 Essential (primary) hypertension: Secondary | ICD-10-CM

## 2015-02-24 DIAGNOSIS — E785 Hyperlipidemia, unspecified: Secondary | ICD-10-CM

## 2015-02-24 DIAGNOSIS — E669 Obesity, unspecified: Secondary | ICD-10-CM | POA: Diagnosis not present

## 2015-02-24 DIAGNOSIS — R7303 Prediabetes: Secondary | ICD-10-CM | POA: Diagnosis not present

## 2015-02-24 DIAGNOSIS — N390 Urinary tract infection, site not specified: Secondary | ICD-10-CM | POA: Diagnosis not present

## 2015-02-24 DIAGNOSIS — G4733 Obstructive sleep apnea (adult) (pediatric): Secondary | ICD-10-CM

## 2015-02-24 DIAGNOSIS — F325 Major depressive disorder, single episode, in full remission: Secondary | ICD-10-CM

## 2015-02-24 DIAGNOSIS — Z79899 Other long term (current) drug therapy: Secondary | ICD-10-CM | POA: Diagnosis not present

## 2015-02-24 DIAGNOSIS — E559 Vitamin D deficiency, unspecified: Secondary | ICD-10-CM

## 2015-02-24 LAB — CBC WITH DIFFERENTIAL/PLATELET
Basophils Absolute: 0 10*3/uL (ref 0.0–0.1)
Basophils Relative: 0 % (ref 0–1)
Eosinophils Absolute: 0 10*3/uL (ref 0.0–0.7)
Eosinophils Relative: 1 % (ref 0–5)
HCT: 40.6 % (ref 36.0–46.0)
Hemoglobin: 13.2 g/dL (ref 12.0–15.0)
Lymphocytes Relative: 41 % (ref 12–46)
Lymphs Abs: 1.6 10*3/uL (ref 0.7–4.0)
MCH: 29.1 pg (ref 26.0–34.0)
MCHC: 32.5 g/dL (ref 30.0–36.0)
MCV: 89.4 fL (ref 78.0–100.0)
MPV: 9.2 fL (ref 8.6–12.4)
Monocytes Absolute: 0.5 10*3/uL (ref 0.1–1.0)
Monocytes Relative: 12 % (ref 3–12)
Neutro Abs: 1.7 10*3/uL (ref 1.7–7.7)
Neutrophils Relative %: 46 % (ref 43–77)
Platelets: 279 10*3/uL (ref 150–400)
RBC: 4.54 MIL/uL (ref 3.87–5.11)
RDW: 13.4 % (ref 11.5–15.5)
WBC: 3.8 10*3/uL — ABNORMAL LOW (ref 4.0–10.5)

## 2015-02-24 LAB — HEMOGLOBIN A1C
Hgb A1c MFr Bld: 6 % — ABNORMAL HIGH (ref <5.7)
Mean Plasma Glucose: 126 mg/dL — ABNORMAL HIGH (ref <117)

## 2015-02-24 NOTE — Progress Notes (Signed)
Assessment and Plan:  1. Hypertension -Continue medication, monitor blood pressure at home. Continue DASH diet.  Reminder to go to the ER if any CP, SOB, nausea, dizziness, severe HA, changes vision/speech, left arm numbness and tingling and jaw pain.  2. Cholesterol -Continue diet and exercise. Check cholesterol.   3. Prediabetes  -Continue diet and exercise. Check A1C  4. Vitamin D Def - check level and continue medications.   5. Obesity with co morbidities - long discussion about weight loss, diet, and exercise  6. Recurrent UTI, without symptoms Recheck urine,    Continue diet and meds as discussed. Further disposition pending results of labs. Over 30 minutes of exam, counseling, chart review, and critical decision making was performed Future Appointments Date Time Provider [place]  11/24/2015 10:00 AM Vicie Mutters, PA-C [id number] None     HPI 59 y.o. female  presents for 3 month follow up on hypertension, cholesterol, prediabetes, and vitamin D deficiency.   Her blood pressure has been controlled at home, today their BP is BP: 108/60 mmHg  She does workout. She denies chest pain, shortness of breath, dizziness.  She is not on cholesterol medication and denies myalgias. Her cholesterol is at goal. The cholesterol last visit was:   Lab Results  Component Value Date   CHOL 177 11/21/2014   HDL 41* 11/21/2014   LDLCALC 115* 11/21/2014   TRIG 105 11/21/2014   CHOLHDL 4.3 11/21/2014    She has been working on diet and exercise for prediabetes, and denies paresthesia of the feet, polydipsia, polyuria and visual disturbances. Last A1C in the office was:  Lab Results  Component Value Date   HGBA1C 6.1* 11/21/2014   Patient is on Vitamin D supplement.   Lab Results  Component Value Date   VD25OH 63 11/21/2014     Patient has depression, is on celexa and wellbutrin, Occ takes 1/2 xanax AM and PM.  She has SLE and follows with [name], is on plaquenil.   She has had repeated UTI's.  BMI is Body mass index is 33.27 kg/(m^2)., she is working on diet and exercise. She had a + sleep study in Jan 2016 but can not afford CPAP. She also had possibly RLS, started on requip last visit but it did not help so she stopped it.  Wt Readings from Last 3 Encounters:  02/24/15 176 lb (79.833 kg)  01/24/15 173 lb 3.2 oz (78.563 kg)  11/21/14 173 lb (78.472 kg)     Current Medications:  Current Outpatient Prescriptions on File Prior to Visit  Medication Sig Dispense Refill  . ALPRAZolam (XANAX) 0.5 MG tablet TAKE 1/2-1 TABLET BY MOUTH 3 TIMES A DAY AS NEEDED FOR ANXIETY 90 tablet 0  . buPROPion (WELLBUTRIN XL) 150 MG 24 hr tablet TAKE 1 TABLET BY MOUTH EVERY MORNING 30 tablet 3  . Cholecalciferol (VITAMIN D) 2000 UNITS CAPS Take 1 capsule by mouth daily.    . citalopram (CELEXA) 40 MG tablet TAKE 1 TABLET DAILY FOR MOOD 90 tablet 1  . hydroxychloroquine (PLAQUENIL) 200 MG tablet TAKE 1 TABLET EVERY DAY 90 tablet 1  . Magnesium 500 MG CAPS Take by mouth.     No current facility-administered medications on file prior to visit.   Medical History:  Past Medical History  Diagnosis Date  . Lupus ([place])   . Arthritis   . Depression   . Hypertension     Labile  . Cystocele   . Prediabetes   . Seizure ([place])   .  Vitamin D deficiency   . Hyperlipidemia    Allergies:  Allergies  Allergen Reactions  . Bactrim [Sulfamethoxazole-Trimethoprim]     GI upset  . Cymbalta [Duloxetine Hcl]     Sweating     Review of Systems:  Review of Systems  Constitutional: Negative.   HENT: Negative.   Eyes: Negative.   Respiratory: Negative.   Cardiovascular: Negative.   Gastrointestinal: Positive for constipation (better with exercise, benefiber, and water). Negative for heartburn, nausea, vomiting, abdominal pain, diarrhea, blood in stool and melena.  Genitourinary: Negative.   Musculoskeletal: Positive for joint pain (right knee pain, feels that her knees are  weak that causes imbalance, declines PT, has been better with exericse. ). Negative for myalgias, back pain, falls and neck pain.  Skin: Negative.   Neurological: Negative.  Negative for dizziness.  Endo/Heme/Allergies: Negative.   Psychiatric/Behavioral: Negative.     Family history- Review and unchanged Social history- Review and unchanged Physical Exam: BP 108/60 mmHg  Pulse 75  Temp(Src) 97.7 F (36.5 C) (Temporal)  Resp 14  Ht 5\' 1"  (1.549 m)  Wt 176 lb (79.833 kg)  BMI 33.27 kg/m2  SpO2 99% Wt Readings from Last 3 Encounters:  02/24/15 176 lb (79.833 kg)  01/24/15 173 lb 3.2 oz (78.563 kg)  11/21/14 173 lb (78.472 kg)   General Appearance: Well nourished, in no apparent distress. Eyes: PERRLA, EOMs, conjunctiva no swelling or erythema Sinuses: No Frontal/maxillary tenderness ENT/Mouth: Ext aud canals clear, TMs without erythema, bulging. No erythema, swelling, or exudate on post pharynx.  Tonsils not swollen or erythematous. Hearing normal.  Neck: Supple, thyroid normal.  Respiratory: Respiratory effort normal, BS equal bilaterally without rales, rhonchi, wheezing or stridor.  Cardio: RRR with no MRGs. Brisk peripheral pulses without edema.  Abdomen: Soft, + BS,  Non tender, no guarding, rebound, hernias, masses. Lymphatics: Non tender without lymphadenopathy.  Musculoskeletal: Full ROM, 5/5 strength, Normal gait Skin: Warm, dry without rashes, lesions, ecchymosis.  Neuro: Cranial nerves intact. Normal muscle tone, no cerebellar symptoms. Psych: Awake and oriented X 3, normal affect, Insight and Judgment appropriate.    Vicie Mutters, PA-C 8:54 AM [facility]

## 2015-02-25 LAB — TSH: TSH: 1.027 u[IU]/mL (ref 0.350–4.500)

## 2015-02-25 LAB — HEPATIC FUNCTION PANEL
ALT: 12 U/L (ref 6–29)
AST: 15 U/L (ref 10–35)
Albumin: 3.6 g/dL (ref 3.6–5.1)
Alkaline Phosphatase: 53 U/L (ref 33–130)
Bilirubin, Direct: 0.1 mg/dL (ref <=0.2)
Indirect Bilirubin: 0.3 mg/dL (ref 0.2–1.2)
Total Bilirubin: 0.4 mg/dL (ref 0.2–1.2)
Total Protein: 6.9 g/dL (ref 6.1–8.1)

## 2015-02-25 LAB — BASIC METABOLIC PANEL WITH GFR
BUN: 8 mg/dL (ref 7–25)
CO2: 28 mmol/L (ref 20–31)
Calcium: 9.1 mg/dL (ref 8.6–10.4)
Chloride: 107 mmol/L (ref 98–110)
Creat: 0.67 mg/dL (ref 0.50–1.05)
GFR, Est African American: >89 mL/min (ref >=60)
GFR, Est Non African American: >89 mL/min (ref >=60)
Glucose, Bld: 57 mg/dL — ABNORMAL LOW (ref 65–99)
Potassium: 4.1 mmol/L (ref 3.5–5.3)
Sodium: 141 mmol/L (ref 135–146)

## 2015-02-25 LAB — MAGNESIUM: Magnesium: 1.7 mg/dL (ref 1.5–2.5)

## 2015-02-25 LAB — URINALYSIS, ROUTINE W REFLEX MICROSCOPIC
Bilirubin Urine: NEGATIVE
Glucose, UA: NEGATIVE
Hgb urine dipstick: NEGATIVE
Ketones, ur: NEGATIVE
Leukocytes, UA: NEGATIVE
Nitrite: NEGATIVE
Protein, ur: NEGATIVE
Specific Gravity, Urine: 1.02 (ref 1.001–1.035)
pH: 6 (ref 5.0–8.0)

## 2015-02-25 LAB — LIPID PANEL
Cholesterol: 156 mg/dL (ref 125–200)
HDL: 35 mg/dL — ABNORMAL LOW (ref >=46)
LDL Cholesterol: 99 mg/dL (ref <130)
Total CHOL/HDL Ratio: 4.5 Ratio (ref <=5.0)
Triglycerides: 108 mg/dL (ref <150)
VLDL: 22 mg/dL (ref <30)

## 2015-02-25 LAB — INSULIN, FASTING: Insulin fasting, serum: 36.3 u[IU]/mL — ABNORMAL HIGH (ref 2.0–19.6)

## 2015-02-25 LAB — VITAMIN D 25 HYDROXY (VIT D DEFICIENCY, FRACTURES): Vit D, 25-Hydroxy: 62 ng/mL (ref 30–100)

## 2015-02-27 ENCOUNTER — Encounter: Payer: Self-pay | Admitting: Physician Assistant

## 2015-02-27 LAB — URINE CULTURE: Colony Count: 80000

## 2015-02-27 MED ORDER — NITROFURANTOIN MONOHYD MACRO 100 MG PO CAPS: 100.0000 mg | ORAL_CAPSULE | Freq: Two times a day (BID) | ORAL | Status: AC

## 2015-02-27 NOTE — Addendum Note (Signed)
Addended by: Vicie Mutters R on: 02/27/2015 07:22 AM   Modules accepted: Orders

## 2015-03-10 ENCOUNTER — Encounter: Payer: Self-pay | Admitting: *Deleted

## 2015-03-11 ENCOUNTER — Encounter: Payer: Self-pay | Admitting: Physician Assistant

## 2015-03-25 ENCOUNTER — Other Ambulatory Visit: Payer: Self-pay | Admitting: Physician Assistant

## 2015-03-27 ENCOUNTER — Other Ambulatory Visit: Payer: BC Managed Care – PPO

## 2015-03-27 DIAGNOSIS — N390 Urinary tract infection, site not specified: Secondary | ICD-10-CM

## 2015-03-28 LAB — URINALYSIS, ROUTINE W REFLEX MICROSCOPIC
Bilirubin Urine: NEGATIVE
Glucose, UA: NEGATIVE
Hgb urine dipstick: NEGATIVE
Ketones, ur: NEGATIVE
Leukocytes, UA: NEGATIVE
Nitrite: NEGATIVE
Protein, ur: NEGATIVE
Specific Gravity, Urine: 1.015 (ref 1.001–1.035)
pH: 7 (ref 5.0–8.0)

## 2015-03-29 LAB — URINE CULTURE: Colony Count: >=100000

## 2015-03-30 MED ORDER — CIPROFLOXACIN HCL 500 MG PO TABS: 500.0000 mg | ORAL_TABLET | Freq: Two times a day (BID) | ORAL | Status: DC

## 2015-03-31 ENCOUNTER — Encounter: Payer: Self-pay | Admitting: Physician Assistant

## 2015-04-14 ENCOUNTER — Other Ambulatory Visit: Payer: Self-pay | Admitting: Physician Assistant

## 2015-04-15 ENCOUNTER — Encounter: Payer: Self-pay | Admitting: Physician Assistant

## 2015-04-15 MED ORDER — BUPROPION HCL ER (XL) 150 MG PO TB24: 150.0000 mg | ORAL_TABLET | Freq: Two times a day (BID) | ORAL | Status: DC

## 2015-04-19 ENCOUNTER — Other Ambulatory Visit: Payer: Self-pay | Admitting: Physician Assistant

## 2015-04-21 NOTE — Telephone Encounter (Signed)
Rx called in 

## 2015-04-22 ENCOUNTER — Ambulatory Visit (INDEPENDENT_AMBULATORY_CARE_PROVIDER_SITE_OTHER): Payer: BC Managed Care – PPO | Admitting: Physician Assistant

## 2015-04-22 ENCOUNTER — Encounter: Payer: Self-pay | Admitting: Physician Assistant

## 2015-04-22 VITALS — BP 126/72 | HR 66 | Temp 97.5°F | Resp 14 | Ht 61.0 in | Wt 176.0 lb

## 2015-04-22 DIAGNOSIS — L298 Other pruritus: Secondary | ICD-10-CM | POA: Diagnosis not present

## 2015-04-22 DIAGNOSIS — Z113 Encounter for screening for infections with a predominantly sexual mode of transmission: Secondary | ICD-10-CM | POA: Diagnosis not present

## 2015-04-22 DIAGNOSIS — R350 Frequency of micturition: Secondary | ICD-10-CM

## 2015-04-22 DIAGNOSIS — N390 Urinary tract infection, site not specified: Secondary | ICD-10-CM

## 2015-04-22 DIAGNOSIS — N898 Other specified noninflammatory disorders of vagina: Secondary | ICD-10-CM

## 2015-04-22 MED ORDER — FLUCONAZOLE 150 MG PO TABS: 150.0000 mg | ORAL_TABLET | Freq: Every day | ORAL | Status: DC

## 2015-04-22 NOTE — Patient Instructions (Signed)
Asymptomatic Bacteriuria, Female Asymptomatic bacteriuria is the presence of a large number of bacteria in your urine without the usual symptoms of burning or frequent urination. The following conditions increase the risk of asymptomatic bacteriuria: 1. Diabetes mellitus. 2. Advanced age. 3. Pregnancy in the first trimester. 4. Kidney stones. 5. Kidney transplants. 6. Leaky kidney tube valve in young children (reflux). Treatment for this condition is not needed in most people and can lead to other problems such as too much yeast and growth of resistant bacteria. However, some people, such as pregnant women, do need treatment to prevent kidney infection. Asymptomatic bacteriuria in pregnancy is also associated with fetal growth restriction, premature labor, and newborn death. HOME CARE INSTRUCTIONS Monitor your condition for any changes. The following actions may help to relieve any discomfort you are feeling:  Drink enough water and fluids to keep your urine clear or pale yellow. Go to the bathroom more often to keep your bladder empty.  Keep the area around your vagina and rectum clean. Wipe yourself from front to back after urinating. SEEK IMMEDIATE MEDICAL CARE IF:  You develop signs of an infection such as:  Burning with urination.  Frequency of voiding.  Back pain.  Fever.  You have blood in the urine.  You develop a fever. MAKE SURE YOU:  Understand these instructions.  Will watch your condition.  Will get help right away if you are not doing well or get worse.   This information is not intended to replace advice given to you by your health care provider. Make sure you discuss any questions you have with your health care provider.   Document Released: 05/03/2005 Document Revised: 05/24/2014 Document Reviewed: 10/23/2012 Elsevier Interactive Patient Education 2016 [place].  About Cystocele  Overview  The pelvic organs, including the bladder, are normally  supported by pelvic floor muscles and ligaments.  When these muscles and ligaments are stretched, weakened or torn, the wall between the bladder and the vagina sags or herniates causing a prolapse, sometimes called a cystocele.  This condition may cause discomfort and problems with emptying the bladder.  It can be present in various stages.  Some people are not aware of the changes.  Others may notice changes at the vaginal opening or a feeling of the bladder dropping outside the body.  Causes of a Cystocele  A cystocele is usually caused by muscle straining or stretching during childbirth.  In addition, cystocele is more common after menopause, because the hormone estrogen helps keep the elastic tissues around the pelvic organs strong.  A cystocele is more likely to occur when levels of estrogen decrease.  Other causes include: heavy lifting, chronic coughing, previous pelvic surgery and obesity.  Symptoms  A bladder that has dropped from its normal position may cause: unwanted urine leakage (stress incontinence), frequent urination or urge to urinate, incomplete emptying of the bladder (not feeling bladder relief after emptying), pain or discomfort in the vagina, pelvis, groin, lower back or lower abdomen and frequent urinary tract infections.  Mild cases may not cause any symptoms.  Treatment Options  Pelvic floor (Kegel) exercises:  Strength training the muscles in your genital area  Behavioral changes: Treating and preventing constipation, taking time to empty your bladder properly, learning to lift properly and/or avoid heavy lifting when possible, stopping smoking, avoiding weight gain and treating a chronic cough or bronchitis.  A pessary: A vaginal support device is sometimes used to help pelvic support caused by muscle and ligament changes.  Surgery:  Surgical repair may be necessary if symptoms cannot be managed with exercise, behavioral changes and a pessary.  Surgery is usually  considered for severe cases.   2007, [employer]  VAGINAL DRYNESS OVERVIEW  Vaginal dryness, also known as atrophic vaginitis, is a common condition in postmenopausal women. This condition is also common in women who have had both ovaries removed at the time of hysterectomy.   Some women have uncomfortable symptoms of vaginal dryness, such as pain with sex, burning vaginal discomfort or itching, or abnormal vaginal discharge, while others have no symptoms at all.  VAGINAL DRYNESS CAUSES   Estrogen helps to keep the vagina moist and to maintain thickness of the vaginal lining. Vaginal dryness occurs when the ovaries produce a decreased amount of estrogen. This can occur at certain times in a woman's life, and may be permanent or temporary. Times when less estrogen is made include: ?At the time of menopause. ?After surgical removal of the ovaries, chemotherapy, or radiation therapy of the pelvis for cancer. ?After having a baby, particularly in women who breastfeed. ?While using certain medications, such as danazol, medroxyprogesterone (brand names: Provera or DepoProvera), leuprolide (brand name: Lupron), or nafarelin. When these medications are stopped, estrogen production resumes.  Women who smoke cigarettes have been shown to have an increased risk of an earlier menopause transition as compared to non-smokers. Therefore, atrophic vaginitis symptoms may appear at a younger age in this population.  VAGINAL DRYNESS TREATMENT   There are three treatment options for women with vaginal dryness:  Vaginal lubricants and moisturizers - Vaginal lubricants and moisturizers can be purchased without a prescription. These products do not contain any hormones and have virtually no side effects. - Albolene is found in the facial cleanser section at [facility], [facility], or [employer]. It is a large jar with a blue top. This is the best lubricant for women because it is hypoallergenic. -Natural  lubricants, such as olive, avocado or peanut oil, are easily available products that may be used as a lubricant with sex.  -Vaginal moisturizes (eg, Replens, Moist Again, Vagisil, K-Y Silk-E, and Feminease) are formulated to allow water to be retained in the vaginal tissues. Moisturizers are applied into the vagina three times weekly to allow a continued moisturizing effect. These should not be used just before having sex, as they can be irritating.  Vaginal estrogen - Vaginal estrogen is the most effective treatment option for women with vaginal dryness. Vaginal estrogen must be prescribed by a healthcare provider. Very low doses of vaginal estrogen can be used when it is put into the vagina to treat vaginal dryness. A small amount of estrogen is absorbed into the bloodstream, but only about 100 times less than when using estrogen pills or tablets. As a result, there is a much lower risk of side effects, such as blood clots, breast cancer, and heart attack, compared with other estrogen-containing products (birth control pills, menopausal hormone therapy).   Ospemifene - Ospemifene is a prescription medication that is similar to estrogen, but is not estrogen. In the vaginal tissue, it acts similarly to estrogen. In the breast tissue, it acts as an estrogen blocker. It comes in a pill, and is prescribed for women who want to use an estrogen-like medication for vaginal dryness or painful sex associated with vaginal dryness, but prefer not to use a vaginal medication. The medication may cause hot flashes as a side effect. This type of medication may increase the risk of blood clots or uterine cancer. Further study  of ospemifene is needed to evaluate the risk of these complications. This medication has not been tested in women who have had breast cancer or are at a high risk of developing breast cancer.    Sexual activity - Vaginal estrogen improves vaginal dryness quickly, usually within a few weeks. You may  continue to have sex as you treat vaginal dryness because sex itself can help to keep the vaginal tissues healthy. Vaginal intercourse may help the vaginal tissues by keeping them soft and stretchable and preventing the tissues from shrinking.  If sex continues to be painful despite treatment for vaginal dryness, talk to your healthcare provider.

## 2015-04-22 NOTE — Progress Notes (Signed)
Assessment and Plan: 1. Recurrent UTI ? Colonization versus re infection  No cystocele on exam, + vaginal atrophy, check labs May need intercourse prophylaxis versus referral urology No evidence of AB pain/kidney stones  - Urinalysis, Routine w reflex microscopic (not at [facility] - Urine culture  2 Vaginal itching + discharge, will send in diflucan, check labs.  - WET PREP BY MOLECULAR PROBE  3. Screen for STD (sexually transmitted disease) - GC/Chlamydia Probe Amp - RPR - HIV antibody - HSV(herpes simplex vrs) 1+2 ab-IgG - Hepatitis C antibody    HPI 59 y.o.female presents for follow up for depression and recurrent UTI.  She has had a positive urine culture in Jan (enterococcus), Feb (enterococcus), July (proteus miralilis) , August (staph), Sept (klebsiella), Oct (staph), and November (staph). She has urgency and occ dysuria. She does use pads for OAB with occ stress incontinence. Denies vaginal discharge. She does feel like there is something falling out of her vagina, especially with full rectum. No AB pain.   She has had some increasing depression, she recently broke up with her boyfriend, worse around the holidays, her kids don't visit as often. She increase her wellbutrin to 300mg  daily on the 29th and states it is helping some.   Past Medical History  Diagnosis Date  . Lupus ([place])   . Arthritis   . Depression   . Hypertension     Labile  . Cystocele   . Prediabetes   . Seizure ([place])   . Vitamin D deficiency   . Hyperlipidemia      Allergies  Allergen Reactions  . Bactrim [Sulfamethoxazole-Trimethoprim]     GI upset  . Cymbalta [Duloxetine Hcl]     Sweating      Current Outpatient Prescriptions on File Prior to Visit  Medication Sig Dispense Refill  . ALPRAZolam (XANAX) 0.5 MG tablet TAKE 1/2-1 TABLET BY MOUTH 3 TIMES A DAY AS NEEDED FOR ANXIETY 90 tablet 2  . buPROPion (WELLBUTRIN XL) 150 MG 24 hr tablet Take 1 tablet (150 mg total) by mouth 2 (two) times  daily. 60 tablet 3  . Cholecalciferol (VITAMIN D) 2000 UNITS CAPS Take 1 capsule by mouth daily.    . ciprofloxacin (CIPRO) 500 MG tablet Take 1 tablet (500 mg total) by mouth 2 (two) times daily. 7 days 14 tablet 0  . citalopram (CELEXA) 40 MG tablet TAKE 1 TABLET DAILY FOR MOOD 90 tablet 1  . hydroxychloroquine (PLAQUENIL) 200 MG tablet TAKE 1 TABLET EVERY DAY 90 tablet 1  . Magnesium 500 MG CAPS Take by mouth.    Marland Kitchen rOPINIRole (REQUIP) 0.5 MG tablet TAKE 1/2 TO 2 TABLETS AT NIGHT FOR SLEEP 60 tablet 3   No current facility-administered medications on file prior to visit.    ROS: all negative except above.   Physical Exam: There were no vitals filed for this visit. There were no vitals taken for this visit. General Appearance: Well nourished, in no apparent distress. Eyes: PERRLA, EOMs, conjunctiva no swelling or erythema Sinuses: No Frontal/maxillary tenderness ENT/Mouth: Ext aud canals clear, TMs without erythema, bulging. No erythema, swelling, or exudate on post pharynx.  Tonsils not swollen or erythematous. Hearing normal.  Neck: Supple, thyroid normal.  Respiratory: Respiratory effort normal, BS equal bilaterally without rales, rhonchi, wheezing or stridor.  Cardio: RRR with no MRGs. Brisk peripheral pulses without edema.  Abdomen: Soft, + BS.  Non tender, no guarding, rebound, hernias, masses. Lymphatics: Non tender without lymphadenopathy.  Musculoskeletal: Full ROM, 5/5 strength, normal  gait.  Gynecology: VULVA:  bilateral labia with healing ulcers, vulvar hypopigmentation and dryness, VAGINA: atrophic, PELVIC FLOOR EXAM: no cystocele, rectocele or prolapse noted, vaginal discharge - white, curd-like, malodorous and thick, DNA probe for chlamydia and GC and wet prep obtained. Skin: Warm, dry without rashes, lesions, ecchymosis.  Neuro: Cranial nerves intact. Normal muscle tone, no cerebellar symptoms. Sensation intact.  Psych: Awake and oriented X 3, normal affect, Insight and  Judgment appropriate.     Vicie Mutters, PA-C 11:41 AM [facility]

## 2015-04-23 LAB — WET PREP BY MOLECULAR PROBE
Candida species: POSITIVE — AB
Gardnerella vaginalis: NEGATIVE
Trichomonas vaginosis: NEGATIVE

## 2015-04-23 LAB — URINALYSIS, ROUTINE W REFLEX MICROSCOPIC
Bilirubin Urine: NEGATIVE
Glucose, UA: NEGATIVE
Hgb urine dipstick: NEGATIVE
Ketones, ur: NEGATIVE
Nitrite: NEGATIVE
Protein, ur: NEGATIVE
Specific Gravity, Urine: 1.021 (ref 1.001–1.035)
pH: 6.5 (ref 5.0–8.0)

## 2015-04-23 LAB — HSV(HERPES SIMPLEX VRS) I + II AB-IGG
HSV 1 Glycoprotein G Ab, IgG: 5.74 IV — ABNORMAL HIGH
HSV 2 Glycoprotein G Ab, IgG: 11.16 IV — ABNORMAL HIGH

## 2015-04-23 LAB — URINALYSIS, MICROSCOPIC ONLY
Casts: NONE SEEN [LPF]
Crystals: NONE SEEN [HPF]
Yeast: NONE SEEN [HPF]

## 2015-04-23 LAB — HEPATITIS C ANTIBODY: HCV Ab: NEGATIVE

## 2015-04-23 LAB — RPR

## 2015-04-23 LAB — HIV ANTIBODY (ROUTINE TESTING W REFLEX): HIV 1&2 Ab, 4th Generation: NONREACTIVE

## 2015-04-23 LAB — GC/CHLAMYDIA PROBE AMP
CT Probe RNA: NOT DETECTED
GC Probe RNA: NOT DETECTED

## 2015-04-24 ENCOUNTER — Other Ambulatory Visit: Payer: Self-pay | Admitting: Physician Assistant

## 2015-04-24 ENCOUNTER — Encounter: Payer: Self-pay | Admitting: Physician Assistant

## 2015-04-24 LAB — URINE CULTURE
Colony Count: NO GROWTH
Organism ID, Bacteria: NO GROWTH

## 2015-04-24 MED ORDER — VALACYCLOVIR HCL 500 MG PO TABS: 500.0000 mg | ORAL_TABLET | Freq: Every day | ORAL | Status: DC

## 2015-04-28 ENCOUNTER — Encounter: Payer: Self-pay | Admitting: Physician Assistant

## 2015-04-28 ENCOUNTER — Other Ambulatory Visit: Payer: Self-pay | Admitting: Physician Assistant

## 2015-04-28 MED ORDER — CITALOPRAM HYDROBROMIDE 40 MG PO TABS: ORAL_TABLET | ORAL | Status: DC

## 2015-05-01 ENCOUNTER — Encounter: Payer: Self-pay | Admitting: Physician Assistant

## 2015-05-06 MED ORDER — CITALOPRAM HYDROBROMIDE 40 MG PO TABS: ORAL_TABLET | ORAL | Status: DC

## 2015-05-06 MED ORDER — BUPROPION HCL ER (XL) 150 MG PO TB24: 150.0000 mg | ORAL_TABLET | Freq: Two times a day (BID) | ORAL | Status: DC

## 2015-05-20 ENCOUNTER — Encounter: Payer: Self-pay | Admitting: Physician Assistant

## 2015-05-21 ENCOUNTER — Other Ambulatory Visit: Payer: Self-pay | Admitting: Physician Assistant

## 2015-05-21 MED ORDER — ESCITALOPRAM OXALATE 20 MG PO TABS: 20.0000 mg | ORAL_TABLET | Freq: Every day | ORAL | Status: DC

## 2015-05-31 ENCOUNTER — Emergency Department (HOSPITAL_COMMUNITY)
Admission: EM | Admit: 2015-05-31 | Discharge: 2015-05-31 | Disposition: A | Discharge status: Home / Self Care | Payer: BC Managed Care – PPO | Attending: Emergency Medicine | Admitting: Emergency Medicine

## 2015-05-31 ENCOUNTER — Encounter (HOSPITAL_COMMUNITY): Payer: Self-pay

## 2015-05-31 DIAGNOSIS — I10 Essential (primary) hypertension: Secondary | ICD-10-CM | POA: Diagnosis not present

## 2015-05-31 DIAGNOSIS — M1990 Unspecified osteoarthritis, unspecified site: Secondary | ICD-10-CM | POA: Diagnosis not present

## 2015-05-31 DIAGNOSIS — R21 Rash and other nonspecific skin eruption: Secondary | ICD-10-CM

## 2015-05-31 DIAGNOSIS — Z8742 Personal history of other diseases of the female genital tract: Secondary | ICD-10-CM | POA: Insufficient documentation

## 2015-05-31 DIAGNOSIS — F329 Major depressive disorder, single episode, unspecified: Secondary | ICD-10-CM | POA: Insufficient documentation

## 2015-05-31 DIAGNOSIS — E559 Vitamin D deficiency, unspecified: Secondary | ICD-10-CM | POA: Insufficient documentation

## 2015-05-31 DIAGNOSIS — Z79899 Other long term (current) drug therapy: Secondary | ICD-10-CM | POA: Diagnosis not present

## 2015-05-31 MED ORDER — PREDNISONE 10 MG (21) PO TBPK: 10.0000 mg | ORAL_TABLET | Freq: Every day | ORAL | Status: DC

## 2015-05-31 NOTE — Discharge Instructions (Signed)
Ms. Beth [name],  Nice meeting you! Please follow-up with your primary care provider. Return to the emergency department if you develop chest pain, shortness of breath, numbness/tingling/itching in your mouth/throat/face. Feel better soon!  S. Wendie Simmer, PA-C

## 2015-05-31 NOTE — ED Notes (Signed)
She c/o area of red rash at inner proximal left forearm and upper arm which she first noted Thurs.  It has increased in redness and size; and she has noted the beginning of two additional areas of redness at upper left chest area and at left flank/back area.

## 2015-06-02 NOTE — ED Provider Notes (Signed)
CSN: [id number]     Arrival date & time 05/31/15  1000 History   First MD Initiated Contact with Patient 05/31/15 1030     Chief Complaint  Patient presents with  . Rash   HPI Beth [name] is a 60 y.o. F PMH significant for lupus, HTN, HLD, seizures presenting with a rash on her left arm. It has since spread onto her chest, and left flank. She endorses itching and recently changing her perfume. She has tried benadryl without relief. No fevers, chills, SOB, tongue/facial swelling/itching, CP.   Past Medical History  Diagnosis Date  . Lupus ([place])   . Arthritis   . Depression   . Hypertension     Labile  . Cystocele   . Prediabetes   . Seizure ([place])   . Vitamin D deficiency   . Hyperlipidemia    Past Surgical History  Procedure Laterality Date  . Abdominal hysterectomy    . Cesarean section     Family History  Problem Relation Age of Onset  . Hyperlipidemia Mother   . Hypertension Mother   . Heart disease Mother    Social History  Substance Use Topics  . Smoking status: Never Smoker   . Smokeless tobacco: Never Used  . Alcohol Use: No   OB History    No data available     Review of Systems  Ten systems are reviewed and are negative for acute change except as noted in the HPI   Allergies  Bactrim and Cymbalta  Home Medications   Prior to Admission medications   Medication Sig Start Date End Date Taking? Authorizing Provider  ALPRAZolam Duanne Moron) 0.5 MG tablet TAKE 1/2-1 TABLET BY MOUTH 3 TIMES A DAY AS NEEDED FOR ANXIETY 04/19/15  Yes [name], MD  bacitracin 500 UNIT/GM ointment Apply 1 application topically 2 (two) times daily as needed for wound care.   Yes Historical Provider, MD  buPROPion (WELLBUTRIN XL) 150 MG 24 hr tablet Take 1 tablet (150 mg total) by mouth 2 (two) times daily. 05/06/15  Yes Vicie Mutters, PA-C  Calcium Carbonate Antacid (ALKA-SELTZER ANTACID PO) Take 1 each by mouth daily as needed (indigestion.).   Yes Historical Provider, MD   Cholecalciferol (VITAMIN D) 2000 UNITS CAPS Take 1 capsule by mouth daily.   Yes Historical Provider, MD  diphenhydrAMINE (SOMINEX) 25 MG tablet Take 50 mg by mouth daily as needed for itching, allergies or sleep.   Yes Historical Provider, MD  escitalopram (LEXAPRO) 20 MG tablet Take 1 tablet (20 mg total) by mouth daily. 05/21/15 05/20/16 Yes Vicie Mutters, PA-C  hydroxychloroquine (PLAQUENIL) 200 MG tablet TAKE 1 TABLET EVERY DAY 01/29/15  Yes Vicie Mutters, PA-C  Magnesium 250 MG TABS Take 250 mg by mouth daily.   Yes Historical Provider, MD  fluconazole (DIFLUCAN) 150 MG tablet Take 1 tablet (150 mg total) by mouth daily. Patient not taking: Reported on 05/31/2015 04/22/15   Vicie Mutters, PA-C  predniSONE (STERAPRED UNI-PAK 21 TAB) 10 MG (21) TBPK tablet Take 1 tablet (10 mg total) by mouth daily. Take 6 tabs by mouth daily  for 2 days, then 5 tabs for 2 days, then 4 tabs for 2 days, then 3 tabs for 2 days, 2 tabs for 2 days, then 1 tab by mouth daily for 2 days 05/31/15   [place] Lions, PA-C  valACYclovir (VALTREX) 500 MG tablet Take 1 tablet (500 mg total) by mouth daily. Patient not taking: Reported on 05/31/2015 04/24/15   Vicie Mutters, PA-C  BP 139/81 mmHg  Pulse 83  Temp(Src) 98.3 F (36.8 C) (Oral)  Resp 17  SpO2 97% Physical Exam  Constitutional: She appears well-developed and well-nourished. No distress.  HENT:  Head: Normocephalic and atraumatic.  Nose: Nose normal.  Mouth/Throat: Oropharynx is clear and moist. No oropharyngeal exudate.  Airway intact  Eyes: Conjunctivae are normal. Pupils are equal, round, and reactive to light. Right eye exhibits no discharge. Left eye exhibits no discharge. No scleral icterus.  Neck: No tracheal deviation present.  Cardiovascular: Normal rate, regular rhythm, normal heart sounds and intact distal pulses.  Exam reveals no gallop and no friction rub.   No murmur heard. Pulmonary/Chest: Effort normal and breath sounds normal. No  stridor. No respiratory distress. She has no wheezes. She has no rales. She exhibits no tenderness.  Abdominal: Soft. Bowel sounds are normal. She exhibits no distension and no mass. There is no tenderness. There is no rebound and no guarding.  Musculoskeletal: She exhibits no edema.  Lymphadenopathy:    She has no cervical adenopathy.  Neurological: She is alert. Coordination normal.  Skin: Skin is warm and dry. Rash noted. She is not diaphoretic. No erythema.  Erythematous, macular rash along medial left arm and chest, left flank.  Psychiatric: She has a normal mood and affect. Her behavior is normal.  Nursing note and vitals reviewed.   ED Course  Procedures   MDM   Final diagnoses:  Rash   Patient non-toxic appearing and VSS. Most likely allergic dermatitis. Airway intact. Patient may be safely discharged home with prednisone and OTC benadryl. Discussed reasons for return. Patient to follow-up with primary care provider within one week. Patient in understanding and agreement with the plan.  [place] Lions, PA-C 06/02/15 [place] Liu, MD 06/02/15 [id number]

## 2015-06-10 ENCOUNTER — Encounter: Payer: Self-pay | Admitting: Physician Assistant

## 2015-06-18 ENCOUNTER — Ambulatory Visit (INDEPENDENT_AMBULATORY_CARE_PROVIDER_SITE_OTHER): Payer: BC Managed Care – PPO | Admitting: Physician Assistant

## 2015-06-18 ENCOUNTER — Other Ambulatory Visit: Payer: Self-pay

## 2015-06-18 ENCOUNTER — Encounter: Payer: Self-pay | Admitting: Physician Assistant

## 2015-06-18 VITALS — BP 130/72 | HR 73 | Temp 97.7°F | Resp 16 | Ht 61.0 in | Wt 178.0 lb

## 2015-06-18 DIAGNOSIS — I10 Essential (primary) hypertension: Secondary | ICD-10-CM | POA: Diagnosis not present

## 2015-06-18 DIAGNOSIS — F325 Major depressive disorder, single episode, in full remission: Secondary | ICD-10-CM

## 2015-06-18 DIAGNOSIS — E669 Obesity, unspecified: Secondary | ICD-10-CM | POA: Diagnosis not present

## 2015-06-18 MED ORDER — ESCITALOPRAM OXALATE 20 MG PO TABS: 20.0000 mg | ORAL_TABLET | Freq: Every day | ORAL | Status: DC

## 2015-06-18 MED ORDER — TRIAMCINOLONE ACETONIDE 0.5 % EX CREA: 1.0000 "application " | TOPICAL_CREAM | Freq: Two times a day (BID) | CUTANEOUS | Status: DC

## 2015-06-18 NOTE — Progress Notes (Signed)
Assessment and Plan: Depression/anxiety- continue lexapro, may want to decrease wellbutrin to 150mg  daily rather than 300mg , printed information about serotonin syndrome.  Rash- improving, will send in triamcinolone cream use PRN  Future Appointments Date Time Provider [place]  07/24/2015 11:30 AM Vicie Mutters, PA-C [facility] None  11/24/2015 10:00 AM Vicie Mutters, PA-C [facility] None     HPI 60 y.o.female presents for follow for depression and rash.  She has been on lexapro x 1 month and states she is feeling better, still on wellbutrin 300mg  total a day, states she has some muscle stiffness and restlessness.  Rash has improved with steroid use.  She also recently increased her plaquenil with [name] to 2 a day that is helping.    Past Medical History  Diagnosis Date  . Lupus ([place])   . Arthritis   . Depression   . Hypertension     Labile  . Cystocele   . Prediabetes   . Seizure ()   . Vitamin D deficiency   . Hyperlipidemia      Allergies  Allergen Reactions  . Bactrim [Sulfamethoxazole-Trimethoprim]     GI upset  . Cymbalta [Duloxetine Hcl]     Sweating      Current Outpatient Prescriptions on File Prior to Visit  Medication Sig Dispense Refill  . buPROPion (WELLBUTRIN XL) 150 MG 24 hr tablet Take 1 tablet (150 mg total) by mouth 2 (two) times daily. 60 tablet 3  . Cholecalciferol (VITAMIN D) 2000 UNITS CAPS Take 1 capsule by mouth daily.    Marland Kitchen escitalopram (LEXAPRO) 20 MG tablet Take 1 tablet (20 mg total) by mouth daily. 30 tablet 2  . hydroxychloroquine (PLAQUENIL) 200 MG tablet TAKE 1 TABLET EVERY DAY 90 tablet 1  . Magnesium 250 MG TABS Take 250 mg by mouth daily.    . predniSONE (STERAPRED UNI-PAK 21 TAB) 10 MG (21) TBPK tablet Take 1 tablet (10 mg total) by mouth daily. Take 6 tabs by mouth daily  for 2 days, then 5 tabs for 2 days, then 4 tabs for 2 days, then 3 tabs for 2 days, 2 tabs for 2 days, then 1 tab by mouth daily for 2 days 42  tablet 0  . valACYclovir (VALTREX) 500 MG tablet Take 1 tablet (500 mg total) by mouth daily. 30 tablet 5  . bacitracin 500 UNIT/GM ointment Apply 1 application topically 2 (two) times daily as needed for wound care. Reported on 06/18/2015     No current facility-administered medications on file prior to visit.    ROS: all negative except above.   Physical Exam: Filed Weights   06/18/15 1122  Weight: 178 lb (80.74 kg)   BP 130/72 mmHg  Pulse 73  Temp(Src) 97.7 F (36.5 C) (Temporal)  Resp 16  Ht 5\' 1"  (1.549 m)  Wt 178 lb (80.74 kg)  BMI 33.65 kg/m2  SpO2 99% General Appearance: Well nourished, in no apparent distress. Eyes: PERRLA, EOMs, conjunctiva no swelling or erythema Sinuses: No Frontal/maxillary tenderness ENT/Mouth: Ext aud canals clear, TMs without erythema, bulging. No erythema, swelling, or exudate on post pharynx.  Tonsils not swollen or erythematous. Hearing normal.  Neck: Supple, thyroid normal.  Respiratory: Respiratory effort normal, BS equal bilaterally without rales, rhonchi, wheezing or stridor.  Cardio: RRR with no MRGs. Brisk peripheral pulses without edema.  Abdomen: Soft, + BS.  Non tender, no guarding, rebound, hernias, masses. Lymphatics: Non tender without lymphadenopathy.  Musculoskeletal: Full ROM, 5/5 strength, normal gait.  Skin: Warm,  dry without rashes, lesions, ecchymosis.  Neuro: Cranial nerves intact. Normal muscle tone, no cerebellar symptoms. Sensation intact.  Psych: Awake and oriented X 3, normal affect, Insight and Judgment appropriate.     Vicie Mutters, PA-C 11:36 AM [facility]

## 2015-06-18 NOTE — Patient Instructions (Signed)
Continue the lexapro but may want to decrease wellbutrin to one of the 150mg  a day and see if this helps decrease some of your symptoms.     Serotonin Syndrome Serotonin is a brain chemical that regulates the nervous system, which includes the brain, spinal cord, and nerves. Serotonin appears to play a role in all types of behavior, including appetite, emotions, movement, thinking, and response to stress. Excessively high levels of serotonin in the body can cause serotonin syndrome, which is a very dangerous condition. CAUSES This condition can be caused by taking medicines or drugs that increase the level of serotonin in your body. These include:  Antidepressant medicines.  Migraine medicines.  Certain pain medicines.  Certain recreational drugs, including ecstasy, LSD, cocaine, and amphetamines.  Over-the-counter cough or cold medicines that contain dextromethorphan.  Certain herbal supplements, including St. John's wort, ginseng, and nutmeg. This condition usually occurs when you take these medicines or drugs in combination, but it can also happen with a high dose of a single medicine or drug. RISK FACTORS This condition is more likely to develop in:  People who have recently increased the dosage of medicine that increases the serotonin level.  People who just started taking medicine that increases the serotonin level. SYMPTOMS Symptoms of this condition usually happens within several hours of a medicine change. Symptoms include:  Headache.  Muscle twitching or stiffness.  Diarrhea.  Confusion.  Restlessness or agitation.  Shivering or goose bumps.  Loss of muscle coordination.  Rapid heart rate.  Sweating. Severe cases of serotonin syndromecan cause:  Irregular heartbeat.  Seizures.  Loss of consciousness.  High fever. DIAGNOSIS This condition is diagnosed with a medical history and physical exam. You will be asked aboutyour symptoms and your use of  medicines and recreational drugs. Your health care provider may also order lab work or additional tests to rule out other causes of your symptoms. TREATMENT The treatment for this condition depends on the severity of your symptoms. For mild cases, stopping the medicine that caused your condition is usually all that is needed. For moderate to severe cases, hospitalization is required to monitor you and to prevent further muscle damage. HOME CARE INSTRUCTIONS  Take over-the-counter and prescription medicines only as told by your health care provider. This is important.  Check with your health care provider before you start taking any new prescriptions, over-the-counter medicines, herbs, or supplements.  Avoid combining any medicines that can cause this condition to occur.  Keep all follow-up visits as told by your health care provider.This is important.  Maintain a healthy lifestyle.  Eat healthy foods.  Get plenty of sleep.  Exercise regularly.  Do not drink alcohol.  Do not use recreational drugs. SEEK MEDICAL CARE IF:  Medicines do not seem to be helping.  Your symptoms do not improve or they get worse.  You have trouble taking care of yourself. SEEK IMMEDIATE MEDICAL CARE IF:  You have worsening confusion, severe headache, chest pain, high fever, seizures, or loss of consciousness.  You have serious thoughts about hurting yourself or others.  You experience serious side effects of medicine, such as swelling of your face, lips, tongue, or throat.   This information is not intended to replace advice given to you by your health care provider. Make sure you discuss any questions you have with your health care provider.   Document Released: 06/10/2004 Document Revised: 09/17/2014 Document Reviewed: 05/16/2014 Elsevier Interactive Patient Education [employer].

## 2015-06-23 ENCOUNTER — Encounter: Payer: Self-pay | Admitting: Physician Assistant

## 2015-06-24 ENCOUNTER — Encounter: Payer: Self-pay | Admitting: Physician Assistant

## 2015-07-07 ENCOUNTER — Encounter: Payer: Self-pay | Admitting: Physician Assistant

## 2015-07-07 MED ORDER — BUPROPION HCL ER (XL) 150 MG PO TB24: 150.0000 mg | ORAL_TABLET | Freq: Two times a day (BID) | ORAL | Status: DC

## 2015-07-21 ENCOUNTER — Other Ambulatory Visit: Payer: Self-pay | Admitting: Physician Assistant

## 2015-07-24 ENCOUNTER — Encounter: Payer: Self-pay | Admitting: Physician Assistant

## 2015-07-24 ENCOUNTER — Ambulatory Visit (INDEPENDENT_AMBULATORY_CARE_PROVIDER_SITE_OTHER): Payer: BC Managed Care – PPO | Admitting: Physician Assistant

## 2015-07-24 VITALS — BP 120/82 | HR 75 | Temp 97.7°F | Resp 16 | Ht 61.0 in | Wt 178.4 lb

## 2015-07-24 DIAGNOSIS — Z79899 Other long term (current) drug therapy: Secondary | ICD-10-CM | POA: Diagnosis not present

## 2015-07-24 DIAGNOSIS — E559 Vitamin D deficiency, unspecified: Secondary | ICD-10-CM | POA: Diagnosis not present

## 2015-07-24 DIAGNOSIS — E669 Obesity, unspecified: Secondary | ICD-10-CM | POA: Diagnosis not present

## 2015-07-24 DIAGNOSIS — R7303 Prediabetes: Secondary | ICD-10-CM | POA: Diagnosis not present

## 2015-07-24 DIAGNOSIS — E785 Hyperlipidemia, unspecified: Secondary | ICD-10-CM | POA: Diagnosis not present

## 2015-07-24 DIAGNOSIS — I10 Essential (primary) hypertension: Secondary | ICD-10-CM | POA: Diagnosis not present

## 2015-07-24 LAB — HEPATIC FUNCTION PANEL
ALT: 13 U/L (ref 6–29)
AST: 16 U/L (ref 10–35)
Albumin: 3.8 g/dL (ref 3.6–5.1)
Alkaline Phosphatase: 49 U/L (ref 33–130)
Bilirubin, Direct: 0.1 mg/dL (ref <=0.2)
Indirect Bilirubin: 0.2 mg/dL (ref 0.2–1.2)
Total Bilirubin: 0.3 mg/dL (ref 0.2–1.2)
Total Protein: 7.1 g/dL (ref 6.1–8.1)

## 2015-07-24 LAB — CBC WITH DIFFERENTIAL/PLATELET
Basophils Absolute: 0 10*3/uL (ref 0.0–0.1)
Basophils Relative: 0 % (ref 0–1)
Eosinophils Absolute: 0 10*3/uL (ref 0.0–0.7)
Eosinophils Relative: 1 % (ref 0–5)
HCT: 41.3 % (ref 36.0–46.0)
Hemoglobin: 13.4 g/dL (ref 12.0–15.0)
Lymphocytes Relative: 46 % (ref 12–46)
Lymphs Abs: 1.7 10*3/uL (ref 0.7–4.0)
MCH: 29.3 pg (ref 26.0–34.0)
MCHC: 32.4 g/dL (ref 30.0–36.0)
MCV: 90.2 fL (ref 78.0–100.0)
MPV: 9.5 fL (ref 8.6–12.4)
Monocytes Absolute: 0.4 10*3/uL (ref 0.1–1.0)
Monocytes Relative: 11 % (ref 3–12)
Neutro Abs: 1.6 10*3/uL — ABNORMAL LOW (ref 1.7–7.7)
Neutrophils Relative %: 42 % — ABNORMAL LOW (ref 43–77)
Platelets: 247 10*3/uL (ref 150–400)
RBC: 4.58 MIL/uL (ref 3.87–5.11)
RDW: 14 % (ref 11.5–15.5)
WBC: 3.8 10*3/uL — ABNORMAL LOW (ref 4.0–10.5)

## 2015-07-24 LAB — LIPID PANEL
Cholesterol: 182 mg/dL (ref 125–200)
HDL: 35 mg/dL — ABNORMAL LOW (ref >=46)
LDL Cholesterol: 123 mg/dL (ref <130)
Total CHOL/HDL Ratio: 5.2 Ratio — ABNORMAL HIGH (ref <=5.0)
Triglycerides: 118 mg/dL (ref <150)
VLDL: 24 mg/dL (ref <30)

## 2015-07-24 LAB — BASIC METABOLIC PANEL WITH GFR
BUN: 8 mg/dL (ref 7–25)
CO2: 28 mmol/L (ref 20–31)
Calcium: 9.5 mg/dL (ref 8.6–10.4)
Chloride: 102 mmol/L (ref 98–110)
Creat: 0.72 mg/dL (ref 0.50–0.99)
GFR, Est African American: >89 mL/min (ref >=60)
GFR, Est Non African American: >89 mL/min (ref >=60)
Glucose, Bld: 76 mg/dL (ref 65–99)
Potassium: 4.4 mmol/L (ref 3.5–5.3)
Sodium: 137 mmol/L (ref 135–146)

## 2015-07-24 LAB — TSH: TSH: 1.26 mIU/L

## 2015-07-24 LAB — MAGNESIUM: Magnesium: 1.8 mg/dL (ref 1.5–2.5)

## 2015-07-24 MED ORDER — CITALOPRAM HYDROBROMIDE 20 MG PO TABS: 20.0000 mg | ORAL_TABLET | Freq: Every day | ORAL | Status: DC

## 2015-07-24 NOTE — Patient Instructions (Signed)
  Bad carbs also include fruit juice, alcohol, and sweet tea. These are empty calories that do not signal to your brain that you are full.   Please remember the good carbs are still carbs which convert into sugar. So please measure them out no more than 1/2-1 cup of rice, oatmeal, pasta, and beans  Veggies are however free foods! Pile them on.   Not all fruit is created equal. Please see the list below, the fruit at the bottom is higher in sugars than the fruit at the top. Please avoid all dried fruits.     We want weight loss that will last so you should lose 1-2 pounds a week.  THAT IS IT! Please pick THREE things a month to change. Once it is a habit check off the item. Then pick another three items off the list to become habits.  If you are already doing a habit on the list GREAT!  Cross that item off! o Don't drink your calories. Ie, alcohol, soda, fruit juice, and sweet tea.  o Drink more water. Drink a glass when you feel hungry or before each meal.  o Eat breakfast - Complex carb and protein (likeDannon light and fit yogurt, oatmeal, fruit, eggs, turkey bacon). o Measure your cereal.  Eat no more than one cup a day. (ie Kashi) o Eat an apple a day. o Add a vegetable a day. o Try a new vegetable a month. o Use Pam! Stop using oil or butter to cook. o Don't finish your plate or use smaller plates. o Share your dessert. o Eat sugar free Jello for dessert or frozen grapes. o Don't eat 2-3 hours before bed. o Switch to whole wheat bread, pasta, and brown rice. o Make healthier choices when you eat out. No fries! o Pick baked chicken, NOT fried. o Don't forget to SLOW DOWN when you eat. It is not going anywhere.  o Take the stairs. o Park far away in the parking lot o Lift soup cans (or weights) for 10 minutes while watching TV. o Walk at work for 10 minutes during break. o Walk outside 1 time a week with your friend, kids, dog, or significant other. o Start a walking group at  church. o Walk the mall as much as you can tolerate.  o Keep a food diary. o Weigh yourself daily. o Walk for 15 minutes 3 days per week. o Cook at home more often and eat out less.  If life happens and you go back to old habits, it is okay.  Just start over. You can do it!   If you experience chest pain, get short of breath, or tired during the exercise, please stop immediately and inform your doctor.   Before you even begin to attack a weight-loss plan, it pays to remember this: You are not fat. You have fat. Losing weight isn't about blame or shame; it's simply another achievement to accomplish. Dieting is like any other skill-you have to buckle down and work at it. As long as you act in a smart, reasonable way, you'll ultimately get where you want to be. Here are some weight loss pearls for you.  1. It's Not a Diet. It's a Lifestyle Thinking of a diet as something you're on and suffering through only for the short term doesn't work. To shed weight and keep it off, you need to make permanent changes to the way you eat. It's OK to indulge occasionally, of course, but if   you cut calories temporarily and then revert to your old way of eating, you'll gain back the weight quicker than you can say yo-yo. Use it to lose it. Research shows that one of the best predictors of long-term weight loss is how many pounds you drop in the first month. For that reason, nutritionists often suggest being stricter for the first two weeks of your new eating strategy to build momentum. Cut out added sugar and alcohol and avoid unrefined carbs. After that, figure out how you can reincorporate them in a way that's healthy and maintainable.  2. There's a Right Way to Exercise Working out burns calories and fat and boosts your metabolism by building muscle. But those trying to lose weight are notorious for overestimating the number of calories they burn and underestimating the amount they take in. Unfortunately, your system  is biologically programmed to hold on to extra pounds and that means when you start exercising, your body senses the deficit and ramps up its hunger signals. If you're not diligent, you'll eat everything you burn and then some. Use it to lose it. Cardio gets all the exercise glory, but strength and interval training are the real heroes. They help you build lean muscle, which in turn increases your metabolism and calorie-burning ability 3. Don't Overreact to Mild Hunger Some people have a hard time losing weight because of hunger anxiety. To them, being hungry is bad-something to be avoided at all costs-so they carry snacks with them and eat when they don't need to. Others eat because they're stressed out or bored. While you never want to get to the point of being ravenous (that's when bingeing is likely to happen), a hunger pang, a craving, or the fact that it's 3:00 p.m. should not send you racing for the vending machine or obsessing about the energy bar in your purse. Ideally, you should put off eating until your stomach is growling and it's difficult to concentrate.  Use it to lose it. When you feel the urge to eat, use the HALT method. Ask yourself, Am I really hungry? Or am I angry or anxious, lonely or bored, or tired? If you're still not certain, try the apple test. If you're truly hungry, an apple should seem delicious; if it doesn't, something else is going on. Or you can try drinking water and making yourself busy, if you are still hungry try a healthy snack.  4. Not All Calories Are Created Equal The mechanics of weight loss are pretty simple: Take in fewer calories than you use for energy. But the kind of food you eat makes all the difference. Processed food that's high in saturated fat and refined starch or sugar can cause inflammation that disrupts the hormone signals that tell your brain you're full. The result: You eat a lot more.  Use it to lose it. Clean up your diet. Swap in whole,  unprocessed foods, including vegetables, lean protein, and healthy fats that will fill you up and give you the biggest nutritional bang for your calorie buck. In a few weeks, as your brain starts receiving regular hunger and fullness signals once again, you'll notice that you feel less hungry overall and naturally start cutting back on the amount you eat.  5. Protein, Produce, and Plant-Based Fats Are Your Weight-Loss Trinity Here's why eating the three Ps regularly will help you drop pounds. Protein fills you up. You need it to build lean muscle, which keeps your metabolism humming so that you can torch   more fat. People in a weight-loss program who ate double the recommended daily allowance for protein (about 110 grams for a 150-pound woman) lost 70 percent of their weight from fat, while people who ate the RDA lost only about 40 percent, one study found. Produce is packed with filling fiber. "It's very difficult to consume too many calories if you're eating a lot of vegetables. Example: Three cups of broccoli is a lot of food, yet only 93 calories. (Fruit is another story. It can be easy to overeat and can contain a lot of calories from sugar, so be sure to monitor your intake.) Plant-based fats like olive oil and those in avocados and nuts are healthy and extra satiating.  Use it to lose it. Aim to incorporate each of the three Ps into every meal and snack. People who eat protein throughout the day are able to keep weight off, according to a study in the American Journal of Clinical Nutrition. In addition to meat, poultry and seafood, good sources are beans, lentils, eggs, tofu, and yogurt. As for fat, keep portion sizes in check by measuring out salad dressing, oil, and nut butters (shoot for one to two tablespoons). Finally, eat veggies or a little fruit at every meal. People who did that consumed 308 fewer calories but didn't feel any hungrier than when they didn't eat more produce.  7. How You Eat Is  As Important As What You Eat In order for your brain to register that you're full, you need to focus on what you're eating. Sit down whenever you eat, preferably at a table. Turn off the TV or computer, put down your phone, and look at your food. Smell it. Chew slowly, and don't put another bite on your fork until you swallow. When women ate lunch this attentively, they consumed 30 percent less when snacking later than those who listened to an audiobook at lunchtime, according to a study in the British Journal of Nutrition. 8. Weighing Yourself Really Works The scale provides the best evidence about whether your efforts are paying off. Seeing the numbers tick up or down or stagnate is motivation to keep going-or to rethink your approach. A 2015 study at [employer] found that daily weigh-ins helped people lose more weight, keep it off, and maintain that loss, even after two years. Use it to lose it. Step on the scale at the same time every day for the best results. If your weight shoots up several pounds from one weigh-in to the next, don't freak out. Eating a lot of salt the night before or having your period is the likely culprit. The number should return to normal in a day or two. It's a steady climb that you need to do something about. 9. Too Much Stress and Too Little Sleep Are Your Enemies When you're tired and frazzled, your body cranks up the production of cortisol, the stress hormone that can cause carb cravings. Not getting enough sleep also boosts your levels of ghrelin, a hormone associated with hunger, while suppressing leptin, a hormone that signals fullness and satiety. People on a diet who slept only five and a half hours a night for two weeks lost 55 percent less fat and were hungrier than those who slept eight and a half hours, according to a study in the [employer] Medical Association Journal. Use it to lose it. Prioritize sleep, aiming for seven hours or more a night, which research  shows helps lower stress. And make sure you're getting   quality zzz's. If a snoring spouse or a fidgety cat wakes you up frequently throughout the night, you may end up getting the equivalent of just four hours of sleep, according to a study from [employer]. Keep pets out of the bedroom, and use a white-noise app to drown out snoring. 10. You Will Hit a plateau-And You Can Bust Through It As you slim down, your body releases much less leptin, the fullness hormone.  If you're not strength training, start right now. Building muscle can raise your metabolism to help you overcome a plateau. To keep your body challenged and burning calories, incorporate new moves and more intense intervals into your workouts or add another sweat session to your weekly routine. Alternatively, cut an extra 100 calories or so a day from your diet. Now that you've lost weight, your body simply doesn't need as much fuel.   

## 2015-07-24 NOTE — Progress Notes (Signed)
Assessment and Plan:  1. Hypertension -Continue medication, monitor blood pressure at home. Continue DASH diet.  Reminder to go to the ER if any CP, SOB, nausea, dizziness, severe HA, changes vision/speech, left arm numbness and tingling and jaw pain.  2. Cholesterol -Continue diet and exercise. Check cholesterol.   3. Prediabetes  -Continue diet and exercise. Check A1C  4. Vitamin D Def - check level and continue medications.   5. Obesity with co morbidities - long discussion about weight loss, diet, and exercis   Continue diet and meds as discussed. Further disposition pending results of labs. Over 30 minutes of exam, counseling, chart review, and critical decision making was performed Future Appointments Date Time Provider [place]  11/24/2015 10:00 AM Vicie Mutters, PA-C [facility] None     HPI 60 y.o. female  presents for 3 month follow up on hypertension, cholesterol, prediabetes, and vitamin D deficiency.   Her blood pressure has been controlled at home, today their BP is BP: 120/82 mmHg  She does workout. She denies chest pain, shortness of breath, dizziness.  She is not on cholesterol medication and denies myalgias. Her cholesterol is at goal. The cholesterol last visit was:   Lab Results  Component Value Date   CHOL 156 02/24/2015   HDL 35* 02/24/2015   LDLCALC 99 02/24/2015   TRIG 108 02/24/2015   CHOLHDL 4.5 02/24/2015    She has been working on diet and exercise for prediabetes, and denies paresthesia of the feet, polydipsia, polyuria and visual disturbances. Last A1C in the office was:  Lab Results  Component Value Date   HGBA1C 6.0* 02/24/2015   Patient is on Vitamin D supplement.   Lab Results  Component Value Date   VD25OH 62 02/24/2015     Patient has depression, is on celexa and wellbutrin, Occ takes 1/2 xanax AM and PM.  She has SLE and follows with [name], is on plaquenil.  She has had repeated UTI's.  BMI is Body mass index is  33.73 kg/(m^2)., she is working on diet and exercise. She had a + sleep study in Jan 2016 but can not afford CPAP.  Wt Readings from Last 3 Encounters:  07/24/15 178 lb 6.4 oz (80.922 kg)  06/18/15 178 lb (80.74 kg)  04/22/15 176 lb (79.833 kg)     Current Medications:  Current Outpatient Prescriptions on File Prior to Visit  Medication Sig Dispense Refill  . buPROPion (WELLBUTRIN XL) 150 MG 24 hr tablet Take 1 tablet (150 mg total) by mouth 2 (two) times daily. 60 tablet 3  . Cholecalciferol (VITAMIN D) 2000 UNITS CAPS Take 1 capsule by mouth daily.    Marland Kitchen escitalopram (LEXAPRO) 20 MG tablet Take 1 tablet (20 mg total) by mouth daily. 90 tablet 2  . hydroxychloroquine (PLAQUENIL) 200 MG tablet TAKE 1 TABLET EVERY DAY 90 tablet 1  . Magnesium 250 MG TABS Take 250 mg by mouth daily.    Marland Kitchen triamcinolone cream (KENALOG) 0.5 % Apply 1 application topically 2 (two) times daily. 80 g 2  . valACYclovir (VALTREX) 500 MG tablet Take 1 tablet (500 mg total) by mouth daily. 30 tablet 5   No current facility-administered medications on file prior to visit.   Medical History:  Past Medical History  Diagnosis Date  . Lupus ([place])   . Arthritis   . Depression   . Hypertension     Labile  . Cystocele   . Prediabetes   . Seizure ([place])   . Vitamin  D deficiency   . Hyperlipidemia    Allergies:  Allergies  Allergen Reactions  . Bactrim [Sulfamethoxazole-Trimethoprim]     GI upset  . Cymbalta [Duloxetine Hcl]     Sweating     Review of Systems:  Review of Systems  Constitutional: Negative.   HENT: Negative.   Eyes: Negative.   Respiratory: Negative.   Cardiovascular: Negative.   Gastrointestinal: Positive for constipation (better with exercise, benefiber, and water). Negative for heartburn, nausea, vomiting, abdominal pain, diarrhea, blood in stool and melena.  Genitourinary: Negative.   Musculoskeletal: Positive for joint pain (right knee pain, feels that her knees are weak that causes  imbalance, declines PT, has been better with exericse. ). Negative for myalgias, back pain, falls and neck pain.  Skin: Negative.   Neurological: Negative.  Negative for dizziness.  Endo/Heme/Allergies: Negative.   Psychiatric/Behavioral: Negative.     Family history- Review and unchanged Social history- Review and unchanged Physical Exam: BP 120/82 mmHg  Pulse 75  Temp(Src) 97.7 F (36.5 C) (Temporal)  Resp 16  Ht 5\' 1"  (1.549 m)  Wt 178 lb 6.4 oz (80.922 kg)  BMI 33.73 kg/m2  SpO2 99% Wt Readings from Last 3 Encounters:  07/24/15 178 lb 6.4 oz (80.922 kg)  06/18/15 178 lb (80.74 kg)  04/22/15 176 lb (79.833 kg)   General Appearance: Well nourished, in no apparent distress. Eyes: PERRLA, EOMs, conjunctiva no swelling or erythema Sinuses: No Frontal/maxillary tenderness ENT/Mouth: Ext aud canals clear, TMs without erythema, bulging. No erythema, swelling, or exudate on post pharynx.  Tonsils not swollen or erythematous. Hearing normal.  Neck: Supple, thyroid normal.  Respiratory: Respiratory effort normal, BS equal bilaterally without rales, rhonchi, wheezing or stridor.  Cardio: RRR with no MRGs. Brisk peripheral pulses without edema.  Abdomen: Soft, + BS,  Non tender, no guarding, rebound, hernias, masses. Lymphatics: Non tender without lymphadenopathy.  Musculoskeletal: Full ROM, 5/5 strength, Normal gait Skin: Warm, dry without rashes, lesions, ecchymosis.  Neuro: Cranial nerves intact. Normal muscle tone, no cerebellar symptoms. Psych: Awake and oriented X 3, normal affect, Insight and Judgment appropriate.    Vicie Mutters, PA-C 11:51 AM [facility]

## 2015-07-25 LAB — HEMOGLOBIN A1C
Hgb A1c MFr Bld: 6.1 % — ABNORMAL HIGH (ref <5.7)
Mean Plasma Glucose: 128 mg/dL — ABNORMAL HIGH (ref <117)

## 2015-07-25 LAB — VITAMIN D 25 HYDROXY (VIT D DEFICIENCY, FRACTURES): Vit D, 25-Hydroxy: 56 ng/mL (ref 30–100)

## 2015-11-24 ENCOUNTER — Encounter: Payer: Self-pay | Admitting: Physician Assistant

## 2015-12-02 ENCOUNTER — Encounter: Payer: Self-pay | Admitting: Physician Assistant

## 2015-12-02 ENCOUNTER — Other Ambulatory Visit: Payer: Self-pay | Admitting: Physician Assistant

## 2015-12-02 MED ORDER — ALPRAZOLAM 0.5 MG PO TABS: ORAL_TABLET | ORAL | Status: DC

## 2015-12-02 MED ORDER — CITALOPRAM HYDROBROMIDE 40 MG PO TABS: 40.0000 mg | ORAL_TABLET | Freq: Every day | ORAL | Status: DC

## 2015-12-03 ENCOUNTER — Encounter: Payer: Self-pay | Admitting: Physician Assistant

## 2015-12-04 ENCOUNTER — Encounter: Payer: Self-pay | Admitting: Physician Assistant

## 2015-12-12 ENCOUNTER — Other Ambulatory Visit: Payer: Self-pay | Admitting: Physician Assistant

## 2015-12-12 ENCOUNTER — Encounter: Payer: Self-pay | Admitting: Physician Assistant

## 2015-12-12 MED ORDER — PREDNISONE 20 MG PO TABS: ORAL_TABLET | ORAL | 0 refills | Status: DC

## 2015-12-16 ENCOUNTER — Encounter: Payer: Self-pay | Admitting: Physician Assistant

## 2015-12-23 ENCOUNTER — Ambulatory Visit (INDEPENDENT_AMBULATORY_CARE_PROVIDER_SITE_OTHER): Payer: BC Managed Care – PPO | Admitting: Physician Assistant

## 2015-12-23 ENCOUNTER — Encounter: Payer: Self-pay | Admitting: Physician Assistant

## 2015-12-23 VITALS — BP 128/86 | HR 78 | Temp 98.0°F | Resp 16 | Ht 61.0 in | Wt 178.0 lb

## 2015-12-23 DIAGNOSIS — Z0001 Encounter for general adult medical examination with abnormal findings: Secondary | ICD-10-CM

## 2015-12-23 DIAGNOSIS — E559 Vitamin D deficiency, unspecified: Secondary | ICD-10-CM

## 2015-12-23 DIAGNOSIS — E785 Hyperlipidemia, unspecified: Secondary | ICD-10-CM

## 2015-12-23 DIAGNOSIS — Z79899 Other long term (current) drug therapy: Secondary | ICD-10-CM

## 2015-12-23 DIAGNOSIS — Z91048 Other nonmedicinal substance allergy status: Secondary | ICD-10-CM

## 2015-12-23 DIAGNOSIS — D649 Anemia, unspecified: Secondary | ICD-10-CM

## 2015-12-23 DIAGNOSIS — G2581 Restless legs syndrome: Secondary | ICD-10-CM

## 2015-12-23 DIAGNOSIS — G4733 Obstructive sleep apnea (adult) (pediatric): Secondary | ICD-10-CM

## 2015-12-23 DIAGNOSIS — Z1159 Encounter for screening for other viral diseases: Secondary | ICD-10-CM

## 2015-12-23 DIAGNOSIS — M329 Systemic lupus erythematosus, unspecified: Secondary | ICD-10-CM | POA: Diagnosis not present

## 2015-12-23 DIAGNOSIS — E669 Obesity, unspecified: Secondary | ICD-10-CM

## 2015-12-23 DIAGNOSIS — F325 Major depressive disorder, single episode, in full remission: Secondary | ICD-10-CM

## 2015-12-23 DIAGNOSIS — Z9109 Other allergy status, other than to drugs and biological substances: Secondary | ICD-10-CM

## 2015-12-23 DIAGNOSIS — K5900 Constipation, unspecified: Secondary | ICD-10-CM

## 2015-12-23 DIAGNOSIS — R7303 Prediabetes: Secondary | ICD-10-CM

## 2015-12-23 DIAGNOSIS — I10 Essential (primary) hypertension: Secondary | ICD-10-CM | POA: Diagnosis not present

## 2015-12-23 DIAGNOSIS — R6889 Other general symptoms and signs: Secondary | ICD-10-CM | POA: Diagnosis not present

## 2015-12-23 DIAGNOSIS — N393 Stress incontinence (female) (male): Secondary | ICD-10-CM | POA: Diagnosis not present

## 2015-12-23 MED ORDER — BUSPIRONE HCL 10 MG PO TABS: 10.0000 mg | ORAL_TABLET | Freq: Three times a day (TID) | ORAL | 0 refills | Status: DC | PRN

## 2015-12-23 MED ORDER — ESCITALOPRAM OXALATE 20 MG PO TABS: 20.0000 mg | ORAL_TABLET | Freq: Every day | ORAL | 2 refills | Status: DC

## 2015-12-23 NOTE — Patient Instructions (Addendum)
[facility]  7 a.m.-6:30 p.m., Monday 7 a.m.-5 p.m., Tuesday-Friday Schedule an appointment by calling [phone].  Encourage you to get the 3D Mammogram  The 3D Mammogram is much more specific and sensitive to pick up breast cancer. For women with fibrocystic breast or lumpy breast it can be hard to determine if it is cancer or not but the 3D mammogram is able to tell this difference which cuts back on unneeded additional tests or scary call backs.   - over 40% increase in detection of breast cancer - over 40% reduction in false positives.  - fewer call backs - reduced anxiety - improved outcomes - [place]  Try the buspar as needed up to 3 x a day during the day and continue the celexa 1/2 pill or 20mg  daily, can still take xanax at night  If this does not help, stop the celexa and start on the lexapro 20mg  daily.   Our goal is to decrease the xanax  Will check on the chicken pox virus, if you have had it, suggest shingles shot next OV

## 2015-12-23 NOTE — Progress Notes (Signed)
Complete Physical  Assessment and Plan: 1. Essential hypertension - continue medications, DASH diet, exercise and monitor at home. Call if greater than 130/80.  - CBC with Differential/Platelet - BASIC METABOLIC PANEL WITH GFR - Hepatic function panel - TSH - Urinalysis, Routine w reflex microscopic (not at [facility]) - Microalbumin / creatinine urine ratio - EKG 12-Lead  2. Prediabetes Discussed general issues about diabetes pathophysiology and management., Educational material distributed., Suggested low cholesterol diet., Encouraged aerobic exercise., Discussed foot care., Reminded to get yearly retinal exam. - Hemoglobin A1c - Insulin, fasting - LOW EXTREMITY NEUR EXAM DOCUM  3. Obesity Obesity with co morbidities- long discussion about weight loss, diet, and exercise  4. Hyperlipidemia -continue medications, check lipids, decrease fatty foods, increase activity.  - Lipid panel  5. Depression, major, in remission Continue medications - try to decrease xanax use, ,add buspar, if this does not help stop celexa and switch to lexapro.   6. Medication management - Magnesium  7. Vitamin D deficiency - Vit D  25 hydroxy (rtn osteoporosis monitoring)  8. SLE Continue follow up, send [name] labs.   9. Stress incontinence Follow up Dr. Skip Estimable  10. Constipation, unspecified constipation type Continue benefiber, water, exercise  11. Environmental allergies controlled  12. RLS Check iron, etc.   13. OSA Weight loss advised, declines treatment at this time.   14. Complete physical Will check chicken pox virus, if + will get shingles next OV, suggest MGM  Discussed med's effects and SE's. Screening labs and tests as requested with regular follow-up as recommended.  HPI Patient presents for complete physical.   Patient's blood pressure has been controlled at home. Patient denies chest pain, shortness of breath, dizziness. BP: 128/86  Patient's cholesterol is diet  controlled. The patient's cholesterol last visit was  Lab Results  Component Value Date   CHOL 182 07/24/2015   HDL 35 (L) 07/24/2015   LDLCALC 123 07/24/2015   TRIG 118 07/24/2015   CHOLHDL 5.2 (H) 07/24/2015   The patient has been working on diet and exercise for prediabetes, denies changes in vision, polys, and paresthesias. Last A1C in office was Lab Results  Component Value Date   HGBA1C 6.1 (H) 07/24/2015   Vitamin D deficiency, on vitamin d Lab Results  Component Value Date   VD25OH 39 07/24/2015   Patient has depression on celexa 20mg  and wellbutrin 300mg  daily. She is on 1/2 xanax a pill 4 x a day.  She has SLE and follows with [name], on plaquenil, going to cancel the appointment for the study.  BMI is Body mass index is 33.63 kg/m., she is working on diet and exercise, she is doing walking and an exercise class at [employer]. She has OSA but could not afford the CPAP.  Wt Readings from Last 3 Encounters:  12/23/15 178 lb (80.7 kg)  07/24/15 178 lb 6.4 oz (80.9 kg)  06/18/15 178 lb (80.7 kg)    Current Medications:  Current Outpatient Prescriptions on File Prior to Visit  Medication Sig Dispense Refill  . ALPRAZolam (XANAX) 0.5 MG tablet TAKE 1/2 TO 1 TABLET TWO TIMES A DAY AS NEEDED FOR ANXIETY, TRY NOT TO TAKE DAILY 60 tablet 1  . buPROPion (WELLBUTRIN XL) 150 MG 24 hr tablet Take 1 tablet (150 mg total) by mouth 2 (two) times daily. 60 tablet 3  . Cholecalciferol (VITAMIN D) 2000 UNITS CAPS Take 1 capsule by mouth daily.    . citalopram (CELEXA) 40 MG tablet Take 1  tablet (40 mg total) by mouth daily. 90 tablet 1  . hydroxychloroquine (PLAQUENIL) 200 MG tablet TAKE 1 TABLET EVERY DAY (Patient taking differently: TAKE 2 TABLETS EVERY DAY) 90 tablet 1  . Magnesium 250 MG TABS Take 250 mg by mouth daily.     No current facility-administered medications on file prior to visit.    Health Maintenance:   Immunization History  Administered Date(s)  Administered  . Td 05/19/2003  . Tdap 05/23/2013   TDAP: 2015 Pneumovax: declines Flu vaccine: declines Prevnar: N/A Zostavax: N/A Pap: 2016 neg Luciana Axe, NP MGM: 06/2014- very dense breast- category C- neg, will get this year DEXA: 2007 normal Colonoscopy: Dr. Alice Reichert in [place] 10/2013 EGD:N/A CT head 2005 CXR 2005 MRI Lumbar spine 2007 Echo 2005 DEE 2014  Patient Care Team: [name], MD as PCP - General (Internal Medicine) Signe Colt, MD as Referring Physician (Obstetrics and Gynecology) Kristeen Miss, MD as Consulting Physician (Neurosurgery) Hennie Duos, MD as Consulting Physician (Rheumatology) Olean Ree, MD as Referring Physician (Internal Medicine) Ara Kussmaul, MD as Consulting Physician (Ophthalmology)  Medical History:  Past Medical History:  Diagnosis Date  . Arthritis   . Cystocele   . Depression   . Hyperlipidemia   . Hypertension    Labile  . Lupus ([place])   . Prediabetes   . Seizure ([place])   . Vitamin D deficiency    Allergies Allergies  Allergen Reactions  . Bactrim [Sulfamethoxazole-Trimethoprim]     GI upset  . Cymbalta [Duloxetine Hcl]     Sweating  . Acyclovir And Related Itching    SURGICAL HISTORY She  has a past surgical history that includes Abdominal hysterectomy and Cesarean section. FAMILY HISTORY Her family history includes Heart disease in her mother; Hyperlipidemia in her mother; Hypertension in her mother. SOCIAL HISTORY She  reports that she has never smoked. She has never used smokeless tobacco. She reports that she does not drink alcohol or use drugs.  Review of Systems  Constitutional: Negative.   HENT: Negative.   Eyes: Negative.   Respiratory: Negative.   Cardiovascular: Negative.   Gastrointestinal: Positive for constipation (better with exercise, benefiber, and water). Negative for abdominal pain, blood in stool, diarrhea, heartburn, melena, nausea and vomiting.  Genitourinary: Negative.    Musculoskeletal: Positive for joint pain (right knee pain, feels that her knees are weak that causes imbalance, declines PT, has been better with exericse. ). Negative for back pain, falls, myalgias and neck pain.  Skin: Negative.   Neurological: Negative.  Negative for dizziness.  Endo/Heme/Allergies: Negative.   Psychiatric/Behavioral: The patient is nervous/anxious.     Physical Exam: Estimated body mass index is 33.63 kg/m as calculated from the following:   Height as of this encounter: 5\' 1"  (1.549 m).   Weight as of this encounter: 178 lb (80.7 kg). Vitals:   12/23/15 0911  BP: 128/86  Pulse: 78  Resp: 16  Temp: 98 F (36.7 C)   General Appearance: Well nourished, in no apparent distress. Eyes: PERRLA, EOMs, conjunctiva no swelling or erythema, normal fundi and vessels. Sinuses: No Frontal/maxillary tenderness ENT/Mouth: Ext aud canals clear, normal light reflex with TMs without erythema, bulging.  Good dentition. No erythema, swelling, or exudate on post pharynx. Tonsils not swollen or erythematous. Hearing normal. Crowded mouth.  Neck: Supple, thyroid normal. No bruits Respiratory: Respiratory effort normal, BS equal bilaterally without rales, rhonchi, wheezing or stridor. Cardio: RRR without murmurs, rubs or gallops. Brisk peripheral pulses without edema.  Chest:  symmetric, with normal excursions and percussion. Breasts: defer Abdomen: Soft, obese +BS. Non tender, no guarding, rebound, hernias, masses, or organomegaly. .  Lymphatics: Non tender without lymphadenopathy.  Genitourinary: defer Musculoskeletal: Full ROM all peripheral extremities,5/5 strength, and normal gait. Skin: Warm, dry without rashes, lesions, ecchymosis.  Neuro: Cranial nerves intact, reflexes equal bilaterally. Normal muscle tone, no cerebellar symptoms. Sensation intact.  Psych: Awake and oriented X 3, normal affect, Insight and Judgment appropriate.   EKG: WNL no changes.    Vicie Mutters 9:25 AM

## 2015-12-24 ENCOUNTER — Encounter: Payer: Self-pay | Admitting: Physician Assistant

## 2015-12-24 LAB — FERRITIN: Ferritin: 114 ng/mL (ref 20–288)

## 2015-12-24 LAB — BASIC METABOLIC PANEL WITH GFR
BUN: 9 mg/dL (ref 7–25)
CO2: 25 mmol/L (ref 20–31)
Calcium: 9.1 mg/dL (ref 8.6–10.4)
Chloride: 105 mmol/L (ref 98–110)
Creat: 0.85 mg/dL (ref 0.50–0.99)
GFR, Est African American: 86 mL/min (ref >=60)
GFR, Est Non African American: 75 mL/min (ref >=60)
Glucose, Bld: 97 mg/dL (ref 65–99)
Potassium: 4.3 mmol/L (ref 3.5–5.3)
Sodium: 139 mmol/L (ref 135–146)

## 2015-12-24 LAB — URINALYSIS, ROUTINE W REFLEX MICROSCOPIC
Bilirubin Urine: NEGATIVE
Glucose, UA: NEGATIVE
Hgb urine dipstick: NEGATIVE
Ketones, ur: NEGATIVE
Nitrite: NEGATIVE
Protein, ur: NEGATIVE
Specific Gravity, Urine: 1.015 (ref 1.001–1.035)
pH: 7 (ref 5.0–8.0)

## 2015-12-24 LAB — IRON AND TIBC
%SAT: 20 % (ref 11–50)
Iron: 59 ug/dL (ref 45–160)
TIBC: 296 ug/dL (ref 250–450)
UIBC: 237 ug/dL (ref 125–400)

## 2015-12-24 LAB — HEPATIC FUNCTION PANEL
ALT: 15 U/L (ref 6–29)
AST: 17 U/L (ref 10–35)
Albumin: 3.9 g/dL (ref 3.6–5.1)
Alkaline Phosphatase: 57 U/L (ref 33–130)
Bilirubin, Direct: 0.1 mg/dL (ref <=0.2)
Indirect Bilirubin: 0.2 mg/dL (ref 0.2–1.2)
Total Bilirubin: 0.3 mg/dL (ref 0.2–1.2)
Total Protein: 6.7 g/dL (ref 6.1–8.1)

## 2015-12-24 LAB — CBC WITH DIFFERENTIAL/PLATELET
Basophils Absolute: 0 cells/uL (ref 0–200)
Basophils Relative: 0 %
Eosinophils Absolute: 72 cells/uL (ref 15–500)
Eosinophils Relative: 2 %
HCT: 41.9 % (ref 35.0–45.0)
Hemoglobin: 13.3 g/dL (ref 11.7–15.5)
Lymphocytes Relative: 41 %
Lymphs Abs: 1476 cells/uL (ref 850–3900)
MCH: 29 pg (ref 27.0–33.0)
MCHC: 31.7 g/dL — ABNORMAL LOW (ref 32.0–36.0)
MCV: 91.3 fL (ref 80.0–100.0)
MPV: 9.2 fL (ref 7.5–12.5)
Monocytes Absolute: 468 cells/uL (ref 200–950)
Monocytes Relative: 13 %
Neutro Abs: 1584 cells/uL (ref 1500–7800)
Neutrophils Relative %: 44 %
Platelets: 255 10*3/uL (ref 140–400)
RBC: 4.59 MIL/uL (ref 3.80–5.10)
RDW: 13.9 % (ref 11.0–15.0)
WBC: 3.6 10*3/uL — ABNORMAL LOW (ref 3.8–10.8)

## 2015-12-24 LAB — LIPID PANEL
Cholesterol: 179 mg/dL (ref 125–200)
HDL: 51 mg/dL (ref >=46)
LDL Cholesterol: 112 mg/dL (ref <130)
Total CHOL/HDL Ratio: 3.5 Ratio (ref <=5.0)
Triglycerides: 80 mg/dL (ref <150)
VLDL: 16 mg/dL (ref <30)

## 2015-12-24 LAB — MAGNESIUM: Magnesium: 1.9 mg/dL (ref 1.5–2.5)

## 2015-12-24 LAB — URINALYSIS, MICROSCOPIC ONLY
Casts: NONE SEEN [LPF]
Crystals: NONE SEEN [HPF]
Yeast: NONE SEEN [HPF]

## 2015-12-24 LAB — HEMOGLOBIN A1C
Hgb A1c MFr Bld: 6.3 % — ABNORMAL HIGH (ref <5.7)
Mean Plasma Glucose: 134 mg/dL

## 2015-12-24 LAB — MICROALBUMIN / CREATININE URINE RATIO
Creatinine, Urine: 109 mg/dL (ref 20–320)
Microalb Creat Ratio: 3 mcg/mg creat (ref <30)
Microalb, Ur: 0.3 mg/dL

## 2015-12-24 LAB — VITAMIN B12: Vitamin B-12: 560 pg/mL (ref 200–1100)

## 2015-12-24 LAB — VARICELLA ZOSTER ANTIBODY, IGG: Varicella IgG: 3222 Index — ABNORMAL HIGH (ref <135.00)

## 2015-12-24 LAB — TSH: TSH: 0.98 mIU/L

## 2015-12-24 LAB — VITAMIN D 25 HYDROXY (VIT D DEFICIENCY, FRACTURES): Vit D, 25-Hydroxy: 66 ng/mL (ref 30–100)

## 2015-12-27 ENCOUNTER — Encounter: Payer: Self-pay | Admitting: Physician Assistant

## 2015-12-27 LAB — URINE CULTURE
Colony Count: >=100000
Colony Count: >=100000

## 2015-12-29 ENCOUNTER — Other Ambulatory Visit: Payer: Self-pay | Admitting: Physician Assistant

## 2015-12-29 MED ORDER — CIPROFLOXACIN HCL 500 MG PO TABS
500.0000 mg | ORAL_TABLET | Freq: Two times a day (BID) | ORAL | 0 refills | Status: DC
Start: 2015-12-29 — End: 2016-02-09

## 2015-12-29 NOTE — Progress Notes (Signed)
Patient given cipro for UTI, recheck in 1 month

## 2016-01-02 ENCOUNTER — Encounter: Payer: Self-pay | Admitting: Physician Assistant

## 2016-01-04 ENCOUNTER — Encounter: Payer: Self-pay | Admitting: Physician Assistant

## 2016-01-07 ENCOUNTER — Ambulatory Visit (INDEPENDENT_AMBULATORY_CARE_PROVIDER_SITE_OTHER): Payer: BC Managed Care – PPO

## 2016-01-07 DIAGNOSIS — Z23 Encounter for immunization: Secondary | ICD-10-CM

## 2016-01-07 NOTE — Progress Notes (Signed)
Pt presents for pneumovax vaccine w/o concerns or question.

## 2016-01-27 NOTE — Telephone Encounter (Signed)
[employer] has called patient 3 times at [phone], with no returned calls.   Beth [name]

## 2016-01-29 ENCOUNTER — Other Ambulatory Visit: Payer: Self-pay

## 2016-01-29 MED ORDER — BUPROPION HCL ER (XL) 150 MG PO TB24: 150.0000 mg | ORAL_TABLET | Freq: Two times a day (BID) | ORAL | 3 refills | Status: DC

## 2016-01-31 ENCOUNTER — Other Ambulatory Visit: Payer: Self-pay | Admitting: Physician Assistant

## 2016-02-09 ENCOUNTER — Other Ambulatory Visit: Payer: Self-pay | Admitting: Physician Assistant

## 2016-02-09 ENCOUNTER — Encounter: Payer: Self-pay | Admitting: Physician Assistant

## 2016-02-09 MED ORDER — ESCITALOPRAM OXALATE 20 MG PO TABS: 20.0000 mg | ORAL_TABLET | Freq: Every day | ORAL | 2 refills | Status: DC

## 2016-02-15 ENCOUNTER — Other Ambulatory Visit: Payer: Self-pay | Admitting: Physician Assistant

## 2016-02-26 ENCOUNTER — Encounter: Payer: Self-pay | Admitting: Physician Assistant

## 2016-02-27 ENCOUNTER — Other Ambulatory Visit: Payer: Self-pay | Admitting: Physician Assistant

## 2016-03-16 ENCOUNTER — Encounter: Payer: Self-pay | Admitting: Physician Assistant

## 2016-03-16 MED ORDER — HYDROXYCHLOROQUINE SULFATE 200 MG PO TABS: 400.0000 mg | ORAL_TABLET | Freq: Every day | ORAL | 0 refills | Status: DC

## 2016-04-01 ENCOUNTER — Encounter: Payer: Self-pay | Admitting: Physician Assistant

## 2016-04-01 ENCOUNTER — Ambulatory Visit (INDEPENDENT_AMBULATORY_CARE_PROVIDER_SITE_OTHER): Payer: BC Managed Care – PPO | Admitting: Physician Assistant

## 2016-04-01 VITALS — BP 124/68 | HR 68 | Temp 97.5°F | Resp 16 | Ht 61.0 in | Wt 183.8 lb

## 2016-04-01 DIAGNOSIS — G8929 Other chronic pain: Secondary | ICD-10-CM | POA: Diagnosis not present

## 2016-04-01 DIAGNOSIS — E559 Vitamin D deficiency, unspecified: Secondary | ICD-10-CM | POA: Diagnosis not present

## 2016-04-01 DIAGNOSIS — Z79899 Other long term (current) drug therapy: Secondary | ICD-10-CM

## 2016-04-01 DIAGNOSIS — I10 Essential (primary) hypertension: Secondary | ICD-10-CM

## 2016-04-01 DIAGNOSIS — R7303 Prediabetes: Secondary | ICD-10-CM | POA: Diagnosis not present

## 2016-04-01 DIAGNOSIS — E785 Hyperlipidemia, unspecified: Secondary | ICD-10-CM

## 2016-04-01 DIAGNOSIS — M25561 Pain in right knee: Secondary | ICD-10-CM

## 2016-04-01 LAB — LIPID PANEL
Cholesterol: 160 mg/dL (ref <200)
HDL: 38 mg/dL — ABNORMAL LOW (ref >50)
LDL Cholesterol: 94 mg/dL (ref <100)
Total CHOL/HDL Ratio: 4.2 Ratio (ref <5.0)
Triglycerides: 138 mg/dL (ref <150)
VLDL: 28 mg/dL (ref <30)

## 2016-04-01 LAB — CBC WITH DIFFERENTIAL/PLATELET
Basophils Absolute: 38 cells/uL (ref 0–200)
Basophils Relative: 1 %
Eosinophils Absolute: 38 cells/uL (ref 15–500)
Eosinophils Relative: 1 %
HCT: 41.8 % (ref 35.0–45.0)
Hemoglobin: 13.6 g/dL (ref 11.7–15.5)
Lymphocytes Relative: 51 %
Lymphs Abs: 1938 cells/uL (ref 850–3900)
MCH: 29 pg (ref 27.0–33.0)
MCHC: 32.5 g/dL (ref 32.0–36.0)
MCV: 89.1 fL (ref 80.0–100.0)
MPV: 9.3 fL (ref 7.5–12.5)
Monocytes Absolute: 456 cells/uL (ref 200–950)
Monocytes Relative: 12 %
Neutro Abs: 1330 cells/uL — ABNORMAL LOW (ref 1500–7800)
Neutrophils Relative %: 35 %
Platelets: 288 10*3/uL (ref 140–400)
RBC: 4.69 MIL/uL (ref 3.80–5.10)
RDW: 13.7 % (ref 11.0–15.0)
WBC: 3.8 10*3/uL (ref 3.8–10.8)

## 2016-04-01 LAB — HEPATIC FUNCTION PANEL
ALT: 12 U/L (ref 6–29)
AST: 18 U/L (ref 10–35)
Albumin: 4 g/dL (ref 3.6–5.1)
Alkaline Phosphatase: 49 U/L (ref 33–130)
Bilirubin, Direct: 0.1 mg/dL (ref <=0.2)
Indirect Bilirubin: 0.3 mg/dL (ref 0.2–1.2)
Total Bilirubin: 0.4 mg/dL (ref 0.2–1.2)
Total Protein: 7.4 g/dL (ref 6.1–8.1)

## 2016-04-01 LAB — BASIC METABOLIC PANEL WITH GFR
BUN: 15 mg/dL (ref 7–25)
CO2: 26 mmol/L (ref 20–31)
Calcium: 9.6 mg/dL (ref 8.6–10.4)
Chloride: 104 mmol/L (ref 98–110)
Creat: 0.89 mg/dL (ref 0.50–0.99)
GFR, Est African American: 81 mL/min (ref >=60)
GFR, Est Non African American: 71 mL/min (ref >=60)
Glucose, Bld: 86 mg/dL (ref 65–99)
Potassium: 4.5 mmol/L (ref 3.5–5.3)
Sodium: 139 mmol/L (ref 135–146)

## 2016-04-01 LAB — TSH: TSH: 0.83 mIU/L

## 2016-04-01 MED ORDER — MELOXICAM 15 MG PO TABS: ORAL_TABLET | ORAL | 1 refills | Status: DC

## 2016-04-01 MED ORDER — PHENTERMINE HCL 37.5 MG PO TABS: 37.5000 mg | ORAL_TABLET | Freq: Every day | ORAL | 2 refills | Status: DC

## 2016-04-01 NOTE — Progress Notes (Signed)
Assessment and Plan:  Hypertension -Continue medication, monitor blood pressure at home. Continue DASH diet.  Reminder to go to the ER if any CP, SOB, nausea, dizziness, severe HA, changes vision/speech, left arm numbness and tingling and jaw pain.  Cholesterol -Continue diet and exercise. Check cholesterol.   Prediabetes  -Continue diet and exercise. Check A1C  Vitamin D Def - check level and continue medications.   Obesity with co morbidities - long discussion about weight loss, diet, and exercise Phentermine given, follow up 3 months  Right knee pain Exercises given, weight loss advised, mobic x 2 weeks, if not better refer to ortho/PT   Continue diet and meds as discussed. Further disposition pending results of labs. Over 30 minutes of exam, counseling, chart review, and critical decision making was performed Future Appointments Date Time Provider [place]  12/23/2016 9:00 AM Vicie Mutters, PA-C [facility] None     HPI 60 y.o. female  presents for 3 month follow up on hypertension, cholesterol, prediabetes, and vitamin D deficiency.  She has had right knee pain x 6 months to a year, getting worse, some popping, stiffness in the AM with sitting, worse with cold weather, and occ knee will "give way" with stairs, bilateral hip stiffness, worse after sitting for a while, occ advil/ASA that will help.   Her blood pressure has been controlled at home, today their BP is BP: 124/68  She does workout. She denies chest pain, shortness of breath, dizziness.  She is not on cholesterol medication and denies myalgias. Her cholesterol is at goal. The cholesterol last visit was:   Lab Results  Component Value Date   CHOL 179 12/23/2015   HDL 51 12/23/2015   LDLCALC 112 12/23/2015   TRIG 80 12/23/2015   CHOLHDL 3.5 12/23/2015    She has been working on diet and exercise for prediabetes, and denies paresthesia of the feet, polydipsia, polyuria and visual disturbances. Last A1C in  the office was:  Lab Results  Component Value Date   HGBA1C 6.3 (H) 12/23/2015   Patient is on Vitamin D supplement.   Lab Results  Component Value Date   VD25OH 66 12/23/2015     Patient has depression, she is on lexapro for depression and doing well.  She has SLE and follows with [name], is on plaquenil.  She has had repeated UTI's, denies symptoms today.  BMI is Body mass index is 34.73 kg/m., she is working on diet and exercise. She had a + sleep study in Jan 2016 but can not afford CPAP.  Wt Readings from Last 3 Encounters:  04/01/16 183 lb 12.8 oz (83.4 kg)  12/23/15 178 lb (80.7 kg)  07/24/15 178 lb 6.4 oz (80.9 kg)     Current Medications:  Current Outpatient Prescriptions on File Prior to Visit  Medication Sig Dispense Refill  . ALPRAZolam (XANAX) 0.5 MG tablet TAKE 1/2 TO 1 TABLET BY MOUTH TWICE DAILY AS NEEDED FOR ANXIETY 60 tablet 1  . Cholecalciferol (VITAMIN D) 2000 UNITS CAPS Take 1 capsule by mouth daily.    Marland Kitchen escitalopram (LEXAPRO) 20 MG tablet TAKE 1 TABLET BY MOUTH EVERY DAY 30 tablet 2  . hydroxychloroquine (PLAQUENIL) 200 MG tablet Take 2 tablets (400 mg total) by mouth daily. 180 tablet 0  . Magnesium 250 MG TABS Take 250 mg by mouth daily.     No current facility-administered medications on file prior to visit.    Medical History:  Past Medical History:  Diagnosis Date  .  Arthritis   . Cystocele   . Depression   . Hyperlipidemia   . Hypertension    Labile  . Lupus   . Prediabetes   . Seizure ([place])   . Vitamin D deficiency    Allergies:  Allergies  Allergen Reactions  . Bactrim [Sulfamethoxazole-Trimethoprim]     GI upset  . Cymbalta [Duloxetine Hcl]     Sweating  . Acyclovir And Related Itching     Review of Systems:  Review of Systems  Constitutional: Negative.   HENT: Negative.   Eyes: Negative.   Respiratory: Negative.   Cardiovascular: Negative.   Gastrointestinal: Positive for constipation (better with exercise,  benefiber, and water). Negative for abdominal pain, blood in stool, diarrhea, heartburn, melena, nausea and vomiting.  Genitourinary: Negative.   Musculoskeletal: Positive for joint pain (right knee pain, feels that her knees are weak that causes imbalance, declines PT, has been better with exericse. ). Negative for back pain, falls, myalgias and neck pain.  Skin: Negative.   Neurological: Negative.  Negative for dizziness.  Endo/Heme/Allergies: Negative.   Psychiatric/Behavioral: Negative.     Family history- Review and unchanged Social history- Review and unchanged Physical Exam: BP 124/68   Pulse 68   Temp 97.5 F (36.4 C)   Resp 16   Ht 5\' 1"  (1.549 m)   Wt 183 lb 12.8 oz (83.4 kg)   SpO2 97%   BMI 34.73 kg/m  Wt Readings from Last 3 Encounters:  04/01/16 183 lb 12.8 oz (83.4 kg)  12/23/15 178 lb (80.7 kg)  07/24/15 178 lb 6.4 oz (80.9 kg)   General Appearance: Well nourished, in no apparent distress. Eyes: PERRLA, EOMs, conjunctiva no swelling or erythema Sinuses: No Frontal/maxillary tenderness ENT/Mouth: Ext aud canals clear, TMs without erythema, bulging. No erythema, swelling, or exudate on post pharynx.  Tonsils not swollen or erythematous. Hearing normal.  Neck: Supple, thyroid normal.  Respiratory: Respiratory effort normal, BS equal bilaterally without rales, rhonchi, wheezing or stridor.  Cardio: RRR with no MRGs. Brisk peripheral pulses without edema.  Abdomen: Soft, + BS,  Non tender, no guarding, rebound, hernias, masses. Lymphatics: Non tender without lymphadenopathy.  Musculoskeletal: Full ROM, 5/5 strength, Normal gait Skin: Warm, dry without rashes, lesions, ecchymosis.  Neuro: Cranial nerves intact. Normal muscle tone, no cerebellar symptoms. Psych: Awake and oriented X 3, normal affect, Insight and Judgment appropriate.    Vicie Mutters, PA-C 9:43 AM [facility]

## 2016-04-01 NOTE — Patient Instructions (Addendum)
Phentermine  While taking the medication we may ask that you come into the office once a month or once every 2-3 months to monitor your weight, blood pressure, and heart rate. In addition we can help answer your questions about diet, exercise, and help you every step of the way with your weight loss journey. Sometime it is helpful if you bring in a food diary or use an app on your phone such as myfitnesspal to record your calorie intake, especially in the beginning.   You can start out on 1/3 to 1/2 a pill in the morning and if you are tolerating it well you can increase to one pill daily. I also have some patients that take 1/3 or 1/2 at lunch to help prevent night time eating.  This medication is cheapest CASH pay at [facility] is 14-17 dollars and you do NOT need a membership to get meds from there.    What is this medicine? PHENTERMINE (FEN ter meen) decreases your appetite. This medicine is intended to be used in addition to a healthy reduced calorie diet and exercise. The best results are achieved this way. This medicine is only indicated for short-term use. Eventually your weight loss may level out and the medication will no longer be needed.   How should I use this medicine? Take this medicine by mouth. Follow the directions on the prescription label. The tablets should stay in the bottle until immediately before you take your dose. Take your doses at regular intervals. Do not take your medicine more often than directed.  Overdosage: If you think you have taken too much of this medicine contact a poison control center or emergency room at once. NOTE: This medicine is only for you. Do not share this medicine with others.  What if I miss a dose? If you miss a dose, take it as soon as you can. If it is almost time for your next dose, take only that dose. Do not take double or extra doses. Do not increase or in any way change your dose without consulting your  doctor.  What should I watch for while using this medicine? Notify your physician immediately if you become short of breath while doing your normal activities. Do not take this medicine within 6 hours of bedtime. It can keep you from getting to sleep. Avoid drinks that contain caffeine and try to stick to a regular bedtime every night. Do not stand or sit up quickly, especially if you are an older patient. This reduces the risk of dizzy or fainting spells. Avoid alcoholic drinks.  What side effects may I notice from receiving this medicine? Side effects that you should report to your doctor or health care professional as soon as possible: -chest pain, palpitations -depression or severe changes in mood -increased blood pressure -irritability -nervousness or restlessness -severe dizziness -shortness of breath -problems urinating -unusual swelling of the legs -vomiting  Side effects that usually do not require medical attention (report to your doctor or health care professional if they continue or are bothersome): -blurred vision or other eye problems -changes in sexual ability or desire -constipation or diarrhea -difficulty sleeping -dry mouth or unpleasant taste -headache -nausea This list may not describe all possible side effects. Call your doctor for medical advice about side effects. You may report side effects to FDA at 1-800-FDA-1088.   Medial Collateral Knee Ligament Sprain, Phase I Rehab Ask your health care provider which exercises are safe for you.  Do exercises exactly as told by your health care provider and adjust them as directed. It is normal to feel mild stretching, pulling, tightness, or discomfort as you do these exercises, but you should stop right away if you feel sudden pain or your pain gets worse.Do not begin these exercises until told by your health care provider. Stretching and range of motion exercises These exercises warm up your muscles and joints and  improve the movement and flexibility of your knee. These exercises also help to relieve pain, numbness, and tingling. Exercise A: Knee flexion, passive 1. Start this exercise in one of these positions:  Lying on the floor in front of an open doorway, with your left / right heel and foot lightly touching the wall.  Lying on the floor with both feet on the wall. 2. Without using any effort, allow gravity to let your foot slide down the wall slowly until you feel a gentle stretch in the front of your left / right knee. 3. Hold this stretch for __________ seconds. 4. Return your leg to the starting position, using your healthy leg to do the work or to help if needed. Repeat __________ times. Complete this stretch __________ times a day. Exercise B: Knee flexion, active 1. Lie on your back with both knees straight. If this causes back discomfort, bend your healthy knee so your foot is flat on the floor. 2. Slowly slide your left / right heel back toward your buttocks until you feel a gentle stretch in the front of your knee or thigh. 3. Hold this position for __________ seconds. 4. Slowly slide your left / right heel back to the starting position. Repeat __________ times. Complete this exercise __________ times a day. Exercise C: Knee extension, sitting 1. Sit with your left / right heel propped on a chair, a coffee table, or a footstool. Do not have anything under your knee to support it. 2. Allow your leg muscles to relax, letting gravity straighten out your knee. Do not let your knee roll inward. You should feel a stretch behind your left / right knee. 3. If told by your health care provider, deepen the stretch by placing a __________ weight on your thigh, just above your kneecap. 4. Hold this position for __________ seconds. Repeat __________ times. Complete this stretch __________ times a day. Strengthening exercises These exercises build strength and endurance in your knee. Endurance is the  ability to use your muscles for a long time, even after they get tired. Isometric exercises involve squeezing your muscles but not moving your knee. Exercise D: Quadriceps, isometric 1. Lie on your back with your left / right leg extended and your other knee bent. 2. If told by your health care provider, put a rolled towel or small pillow under your left / right knee. 3. Slowly tense the muscles in the front of your left / right thigh by pushing your knee down. You should see your kneecap slide up toward your hip or see increased dimpling just above the knee. 4. For __________ seconds, keep the muscle as tight as you can without increasing your pain. 5. Relax your muscles slowly and completely. Repeat __________ times. Complete this exercise __________ times a day. Exercise E: Hamstring, isometric 1. Lie on your back on a firm surface. 2. Bend your left / right knee about __________ degrees. You can prop your knee on a pillow if needed. 3. Dig your heel down and back into the surface as if you are trying  to pull your heel toward your buttocks. Tighten the muscles in the back of your thighs to "dig" as hard as you can without increasing any pain. 4. Hold this position for __________ seconds. 5. Relax your muscles slowly and completely. Repeat __________ times. Complete this exercise __________ times a day. This information is not intended to replace advice given to you by your health care provider. Make sure you discuss any questions you have with your health care provider. Document Released: 05/03/2005 Document Revised: 01/08/2016 Document Reviewed: 03/15/2015 Elsevier Interactive Patient Education  2017 [employer].

## 2016-04-02 ENCOUNTER — Encounter: Payer: Self-pay | Admitting: Physician Assistant

## 2016-04-02 LAB — MAGNESIUM: Magnesium: 1.8 mg/dL (ref 1.5–2.5)

## 2016-04-02 LAB — HEMOGLOBIN A1C
Hgb A1c MFr Bld: 5.8 % — ABNORMAL HIGH (ref <5.7)
Mean Plasma Glucose: 120 mg/dL

## 2016-04-02 LAB — VITAMIN D 25 HYDROXY (VIT D DEFICIENCY, FRACTURES): Vit D, 25-Hydroxy: 62 ng/mL (ref 30–100)

## 2016-05-20 ENCOUNTER — Other Ambulatory Visit: Payer: Self-pay | Admitting: Physician Assistant

## 2016-06-10 ENCOUNTER — Other Ambulatory Visit: Payer: Self-pay | Admitting: Physician Assistant

## 2016-06-21 ENCOUNTER — Other Ambulatory Visit: Payer: Self-pay | Admitting: Physician Assistant

## 2016-06-21 DIAGNOSIS — G8929 Other chronic pain: Secondary | ICD-10-CM

## 2016-06-21 DIAGNOSIS — M25561 Pain in right knee: Principal | ICD-10-CM

## 2016-06-25 ENCOUNTER — Other Ambulatory Visit: Payer: Self-pay | Admitting: Physician Assistant

## 2016-06-25 DIAGNOSIS — Z1231 Encounter for screening mammogram for malignant neoplasm of breast: Secondary | ICD-10-CM

## 2016-07-14 ENCOUNTER — Ambulatory Visit
Admission: RE | Admit: 2016-07-14 | Discharge: 2016-07-14 | Disposition: A | Discharge status: Home / Self Care | Payer: BC Managed Care – PPO | Source: Ambulatory Visit | Attending: Physician Assistant | Admitting: Physician Assistant

## 2016-07-14 DIAGNOSIS — Z1231 Encounter for screening mammogram for malignant neoplasm of breast: Secondary | ICD-10-CM

## 2016-07-19 ENCOUNTER — Encounter: Payer: Self-pay | Admitting: Physician Assistant

## 2016-07-19 ENCOUNTER — Ambulatory Visit (INDEPENDENT_AMBULATORY_CARE_PROVIDER_SITE_OTHER): Payer: BC Managed Care – PPO | Admitting: Physician Assistant

## 2016-07-19 VITALS — BP 120/70 | HR 67 | Temp 97.7°F | Resp 14 | Ht 61.0 in | Wt 184.8 lb

## 2016-07-19 DIAGNOSIS — I10 Essential (primary) hypertension: Secondary | ICD-10-CM | POA: Diagnosis not present

## 2016-07-19 DIAGNOSIS — M329 Systemic lupus erythematosus, unspecified: Secondary | ICD-10-CM

## 2016-07-19 DIAGNOSIS — E559 Vitamin D deficiency, unspecified: Secondary | ICD-10-CM

## 2016-07-19 DIAGNOSIS — R7303 Prediabetes: Secondary | ICD-10-CM

## 2016-07-19 DIAGNOSIS — F325 Major depressive disorder, single episode, in full remission: Secondary | ICD-10-CM | POA: Diagnosis not present

## 2016-07-19 DIAGNOSIS — Z79899 Other long term (current) drug therapy: Secondary | ICD-10-CM

## 2016-07-19 DIAGNOSIS — D649 Anemia, unspecified: Secondary | ICD-10-CM | POA: Diagnosis not present

## 2016-07-19 DIAGNOSIS — E785 Hyperlipidemia, unspecified: Secondary | ICD-10-CM

## 2016-07-19 LAB — HEPATIC FUNCTION PANEL
ALT: 17 U/L (ref 6–29)
AST: 18 U/L (ref 10–35)
Albumin: 3.9 g/dL (ref 3.6–5.1)
Alkaline Phosphatase: 47 U/L (ref 33–130)
Bilirubin, Direct: 0.1 mg/dL (ref <=0.2)
Indirect Bilirubin: 0.3 mg/dL (ref 0.2–1.2)
Total Bilirubin: 0.4 mg/dL (ref 0.2–1.2)
Total Protein: 7 g/dL (ref 6.1–8.1)

## 2016-07-19 LAB — BASIC METABOLIC PANEL WITH GFR
BUN: 15 mg/dL (ref 7–25)
CO2: 28 mmol/L (ref 20–31)
Calcium: 9.6 mg/dL (ref 8.6–10.4)
Chloride: 106 mmol/L (ref 98–110)
Creat: 0.7 mg/dL (ref 0.50–0.99)
GFR, Est African American: >89 mL/min (ref >=60)
GFR, Est Non African American: >89 mL/min (ref >=60)
Glucose, Bld: 95 mg/dL (ref 65–99)
Potassium: 4.2 mmol/L (ref 3.5–5.3)
Sodium: 140 mmol/L (ref 135–146)

## 2016-07-19 LAB — CBC WITH DIFFERENTIAL/PLATELET
Basophils Absolute: 0 cells/uL (ref 0–200)
Basophils Relative: 0 %
Eosinophils Absolute: 41 cells/uL (ref 15–500)
Eosinophils Relative: 1 %
HCT: 42.4 % (ref 35.0–45.0)
Hemoglobin: 13.6 g/dL (ref 11.7–15.5)
Lymphocytes Relative: 44 %
Lymphs Abs: 1804 cells/uL (ref 850–3900)
MCH: 29 pg (ref 27.0–33.0)
MCHC: 32.1 g/dL (ref 32.0–36.0)
MCV: 90.4 fL (ref 80.0–100.0)
MPV: 9.4 fL (ref 7.5–12.5)
Monocytes Absolute: 451 cells/uL (ref 200–950)
Monocytes Relative: 11 %
Neutro Abs: 1804 cells/uL (ref 1500–7800)
Neutrophils Relative %: 44 %
Platelets: 260 10*3/uL (ref 140–400)
RBC: 4.69 MIL/uL (ref 3.80–5.10)
RDW: 14.3 % (ref 11.0–15.0)
WBC: 4.1 10*3/uL (ref 3.8–10.8)

## 2016-07-19 LAB — IRON AND TIBC
%SAT: 20 % (ref 11–50)
Iron: 60 ug/dL (ref 45–160)
TIBC: 304 ug/dL (ref 250–450)
UIBC: 244 ug/dL (ref 125–400)

## 2016-07-19 LAB — LIPID PANEL
Cholesterol: 158 mg/dL (ref <200)
HDL: 36 mg/dL — ABNORMAL LOW (ref >50)
LDL Cholesterol: 104 mg/dL — ABNORMAL HIGH (ref <100)
Total CHOL/HDL Ratio: 4.4 Ratio (ref <5.0)
Triglycerides: 88 mg/dL (ref <150)
VLDL: 18 mg/dL (ref <30)

## 2016-07-19 LAB — HEMOGLOBIN A1C
Hgb A1c MFr Bld: 5.6 % (ref <5.7)
Mean Plasma Glucose: 114 mg/dL

## 2016-07-19 LAB — FERRITIN: Ferritin: 103 ng/mL (ref 20–288)

## 2016-07-19 LAB — MAGNESIUM: Magnesium: 1.9 mg/dL (ref 1.5–2.5)

## 2016-07-19 LAB — TSH: TSH: 1.01 mIU/L

## 2016-07-19 NOTE — Patient Instructions (Addendum)
Try for at least 80 oz of water a day, can increase to 100oz of water.   Try cauliflower rice, can find frozen food section.     Simple math prevails.    1st - exercise does not produce significant weight loss - at best one converts fat into muscle , "bulks up", loses inches, but usually stays "weight neutral"     2nd - think of your body weightas a check book: If you eat more calories than you burn up - you save money or gain weight .... Or if you spend more money than you put in the check book, ie burn up more calories than you eat, then you lose weight     3rd - if you walk or run 1 mile, you burn up 100 calories - you have to burn up 3,500 calories to lose 1 pound, ie you have to walk/run 35 miles to lose 1 measly pound. So if you want to lose 10 #, then you have to walk/run 350 miles, so.... clearly exercise is not the solution.     4. So if you consume 1,500 calories, then you have to burn up the equivalent of 15 miles to stay weight neutral - It also stands to reason that if you consume 1,500 cal/day and don't lose weight, then you must be burning up about 1,500 cals/day to stay weight neutral.     5. If you really want to lose weight, you must cut your calorie intake 300 calories /day and at that rate you should lose about 1 # every 3 days.   6. Please purchase Dr Fara Olden Fuhrman's book(s) "The End of Dieting" & "Eat to Live" . It has some great concepts and recipes.

## 2016-07-19 NOTE — Progress Notes (Signed)
Assessment and Plan:  Hypertension -Continue medication, monitor blood pressure at home. Continue DASH diet.  Reminder to go to the ER if any CP, SOB, nausea, dizziness, severe HA, changes vision/speech, left arm numbness and tingling and jaw pain.  Cholesterol -Continue diet and exercise. Check cholesterol.   Prediabetes  -Continue diet and exercise. Check A1C  Vitamin D Def - check level and continue medications.   Morbid Obesity - long discussion about weight loss, diet, and exercise Phentermine given, follow up 3 months  SLE Continue follow up [name], send labs  Black stool Check iron/ferritin, get on prilosec x 2 weeks, avoid NSAIDS  Continue diet and meds as discussed. Further disposition pending results of labs. Over 30 minutes of exam, counseling, chart review, and critical decision making was performed Future Appointments Date Time Provider [place]  12/23/2016 9:00 AM Vicie Mutters, PA-C [facility] None     HPI 61 y.o. female  presents for 3 month follow up on hypertension, cholesterol, prediabetes, and vitamin D deficiency.  She has had right knee pain x 6 months to a year, getting worse, some popping, stiffness in the AM with sitting, worse with cold weather, and occ knee will "give way" with stairs, bilateral hip stiffness, worse after sitting for a while, occ advil/ASA that will help.   Her blood pressure has been controlled at home, today their BP is BP: 120/70  She does workout. She denies chest pain, shortness of breath, dizziness.  She is not on cholesterol medication and denies myalgias. Her cholesterol is at goal. The cholesterol last visit was:   Lab Results  Component Value Date   CHOL 160 04/01/2016   HDL 38 (L) 04/01/2016   LDLCALC 94 04/01/2016   TRIG 138 04/01/2016   CHOLHDL 4.2 04/01/2016    She has been working on diet and exercise for prediabetes, and denies paresthesia of the feet, polydipsia, polyuria and visual disturbances.  Last A1C in the office was:  Lab Results  Component Value Date   HGBA1C 5.8 (H) 04/01/2016   Patient is on Vitamin D supplement.   Lab Results  Component Value Date   VD25OH 62 04/01/2016     Patient has depression, she is on lexapro for depression and doing well.  She has SLE and follows with [name], is on plaquenil.  She states she had one episode of dark black stool, no GERd symptoms, no AB pain, only once, denies iron/pepto at the time.  BMI is Body mass index is 34.92 kg/m., she is working on diet and exercise. She had a + sleep study in Jan 2016 but can not afford CPAP. She takes 1/2 xanax that helps her sleep however she has nocturia x 2, has dry mouth in the Am, not rested in the morning.  Wt Readings from Last 3 Encounters:  07/19/16 184 lb 12.8 oz (83.8 kg)  04/01/16 183 lb 12.8 oz (83.4 kg)  12/23/15 178 lb (80.7 kg)     Current Medications:  Current Outpatient Prescriptions on File Prior to Visit  Medication Sig Dispense Refill  . ALPRAZolam (XANAX) 0.5 MG tablet TAKE 1/2 TO 1 TABLET BY MOUTH TWICE DAILY AS NEEDED FOR ANXIETY 60 tablet 1  . Cholecalciferol (VITAMIN D) 2000 UNITS CAPS Take 1 capsule by mouth daily.    Marland Kitchen escitalopram (LEXAPRO) 20 MG tablet TAKE 1 TABLET BY MOUTH EVERY DAY 30 tablet 2  . hydroxychloroquine (PLAQUENIL) 200 MG tablet TAKE 2 TABLETS (400 MG TOTAL) BY MOUTH DAILY. [place]  tablet 0  . Magnesium 250 MG TABS Take 250 mg by mouth daily.    . meloxicam (MOBIC) 15 MG tablet TAKE 1 TAB DAILY W/FOOD FOR 2 WEEKS, THEN AS NEEDED, CAN TAKE W/TYLENOL, DON'T TAKE W/ALEVE OR IBU 30 tablet 1   No current facility-administered medications on file prior to visit.    Medical History:  Past Medical History:  Diagnosis Date  . Arthritis   . Cystocele   . Depression   . Hyperlipidemia   . Hypertension    Labile  . Lupus   . Prediabetes   . Seizure ([place])   . Vitamin D deficiency    Allergies:  Allergies  Allergen Reactions  . Bactrim  [Sulfamethoxazole-Trimethoprim]     GI upset  . Cymbalta [Duloxetine Hcl]     Sweating  . Acyclovir And Related Itching     Review of Systems:  Review of Systems  Constitutional: Negative.   HENT: Negative.   Eyes: Negative.   Respiratory: Negative.   Cardiovascular: Negative.   Gastrointestinal: Positive for constipation (better with exercise, magnesium) and melena (one episode with mobic). Negative for abdominal pain, blood in stool, diarrhea, heartburn, nausea and vomiting.  Genitourinary: Negative.   Musculoskeletal: Positive for joint pain (right knee pain, feels that her knees are weak that causes imbalance, declines PT, has been better with exericse. ). Negative for back pain, falls, myalgias and neck pain.  Skin: Negative.   Neurological: Negative.  Negative for dizziness.  Endo/Heme/Allergies: Negative.   Psychiatric/Behavioral: Negative.     Family history- Review and unchanged Social history- Review and unchanged Physical Exam: BP 120/70   Pulse 67   Temp 97.7 F (36.5 C)   Resp 14   Ht 5\' 1"  (1.549 m)   Wt 184 lb 12.8 oz (83.8 kg)   SpO2 98%   BMI 34.92 kg/m  Wt Readings from Last 3 Encounters:  07/19/16 184 lb 12.8 oz (83.8 kg)  04/01/16 183 lb 12.8 oz (83.4 kg)  12/23/15 178 lb (80.7 kg)   General Appearance: Well nourished, in no apparent distress. Eyes: PERRLA, EOMs, conjunctiva no swelling or erythema Sinuses: No Frontal/maxillary tenderness ENT/Mouth: Ext aud canals clear, TMs without erythema, bulging. No erythema, swelling, or exudate on post pharynx.  Tonsils not swollen or erythematous. Hearing normal.  Neck: Supple, thyroid normal.  Respiratory: Respiratory effort normal, BS equal bilaterally without rales, rhonchi, wheezing or stridor.  Cardio: RRR with no MRGs. Brisk peripheral pulses without edema.  Abdomen: Soft, + BS,  Non tender, no guarding, rebound, hernias, masses. Lymphatics: Non tender without lymphadenopathy.  Musculoskeletal: Full  ROM, 5/5 strength, Normal gait Skin: Warm, dry without rashes, lesions, ecchymosis.  Neuro: Cranial nerves intact. Normal muscle tone, no cerebellar symptoms. Psych: Awake and oriented X 3, normal affect, Insight and Judgment appropriate.    Vicie Mutters, PA-C 9:32 AM [facility]

## 2016-07-20 LAB — VITAMIN D 25 HYDROXY (VIT D DEFICIENCY, FRACTURES): Vit D, 25-Hydroxy: 60 ng/mL (ref 30–100)

## 2016-07-21 ENCOUNTER — Encounter: Payer: Self-pay | Admitting: Physician Assistant

## 2016-07-22 ENCOUNTER — Encounter: Payer: Self-pay | Admitting: Physician Assistant

## 2016-08-03 ENCOUNTER — Encounter: Payer: Self-pay | Admitting: Physician Assistant

## 2016-08-07 ENCOUNTER — Other Ambulatory Visit: Payer: Self-pay | Admitting: Physician Assistant

## 2016-08-07 DIAGNOSIS — G8929 Other chronic pain: Secondary | ICD-10-CM

## 2016-08-07 DIAGNOSIS — M25561 Pain in right knee: Principal | ICD-10-CM

## 2016-08-25 ENCOUNTER — Other Ambulatory Visit: Payer: Self-pay | Admitting: Internal Medicine

## 2016-08-25 NOTE — Telephone Encounter (Signed)
Please call alprazolam 

## 2016-09-06 ENCOUNTER — Other Ambulatory Visit: Payer: Self-pay | Admitting: Internal Medicine

## 2016-09-10 ENCOUNTER — Encounter: Payer: Self-pay | Admitting: Physician Assistant

## 2016-09-10 ENCOUNTER — Other Ambulatory Visit: Payer: Self-pay | Admitting: Internal Medicine

## 2016-09-10 MED ORDER — PHENTERMINE HCL 37.5 MG PO TABS: 37.5000 mg | ORAL_TABLET | Freq: Every day | ORAL | 0 refills | Status: DC

## 2016-09-13 ENCOUNTER — Other Ambulatory Visit: Payer: Self-pay | Admitting: Internal Medicine

## 2016-11-21 ENCOUNTER — Encounter: Payer: Self-pay | Admitting: Physician Assistant

## 2016-11-24 ENCOUNTER — Encounter: Payer: Self-pay | Admitting: Physician Assistant

## 2016-11-24 ENCOUNTER — Ambulatory Visit (INDEPENDENT_AMBULATORY_CARE_PROVIDER_SITE_OTHER): Payer: BC Managed Care – PPO | Admitting: Physician Assistant

## 2016-11-24 VITALS — BP 122/76 | HR 76 | Temp 97.6°F | Resp 16 | Ht 61.0 in | Wt 185.4 lb

## 2016-11-24 DIAGNOSIS — L0291 Cutaneous abscess, unspecified: Secondary | ICD-10-CM

## 2016-11-24 MED ORDER — DOXYCYCLINE HYCLATE 100 MG PO CAPS: ORAL_CAPSULE | ORAL | 0 refills | Status: DC

## 2016-11-24 NOTE — Progress Notes (Signed)
   Subjective:    Patient ID: Beth [name], female    DOB: November 29, 1955, 61 y.o.   MRN: [id number]  HPI 61 y.o. AAF with sore/wound on under left breast x Thursday.  Painful sore, open, draining, feels hard lump under it. Has been cleaning it with soap, water, peroxide, and vaseline has gotten worse. It is larger and more painful. She denies any fever, chills, streaking. Last [facility] 06/2016.   Blood pressure 122/76, pulse 76, temperature 97.6 F (36.4 C), resp. rate 16, height 5\' 1"  (1.549 m), weight 185 lb 6.4 oz (84.1 kg).  Medications Current Outpatient Prescriptions on File Prior to Visit  Medication Sig  . ALPRAZolam (XANAX) 0.5 MG tablet TAKE 1/2 TO 1 TABLET BY MOUTH TWICE DAILY AS NEEDED FOR ANXIETY  . Cholecalciferol (VITAMIN D) 2000 UNITS CAPS Take 1 capsule by mouth daily.  Marland Kitchen escitalopram (LEXAPRO) 20 MG tablet TAKE 1 TABLET EVERY DAY  . hydroxychloroquine (PLAQUENIL) 200 MG tablet TAKE 2 TABLETS (400 MG TOTAL) BY MOUTH DAILY.  . Magnesium 250 MG TABS Take 250 mg by mouth daily.  . meloxicam (MOBIC) 15 MG tablet TAKE 1 TAB DAILY W/FOOD FOR 2 WEEKS, THEN AS NEEDED, CAN TAKE W/TYLENOL, DON'T TAKE W/ALEVE OR IBU   No current facility-administered medications on file prior to visit.     Problem list She has Depression, major, in remission ([place]); Environmental allergies; Constipation; SLE; Hypertension; Prediabetes; Hyperlipidemia; Vitamin D deficiency; Obesity; Stress incontinence; Medication management; OSA (obstructive sleep apnea); and RLS (restless legs syndrome) on her problem list.   Review of Systems  Constitutional: Negative.  Negative for chills and fever.  Musculoskeletal: Negative.   Skin: Positive for wound. Negative for color change, pallor and rash.       Objective:   Physical Exam  Skin:  On AB under left breast 0.5 cm draining abscess with area of 2-3 cm surrounding induration and erythema, no fluctuance, some serous sanguinous fluid drained, no streaking or  further surrounding erythema.       Assessment & Plan:  Abscess  Apply hot compresses frequently to promote drainage. Oral antibiotics -- see med orders. RTC PRN. Given information on when to return but has close follow up in August  Future Appointments Date Time Provider [place]  12/23/2016 9:00 AM Vicie Mutters, PA-C [facility] None

## 2016-11-24 NOTE — Patient Instructions (Signed)
Take doxy with food x 10 days call if not better 1-2 weeks Take probiotic with it Clean soap and water, pat dry, and keep covered Call if any fever, chills, or if it gets worse   Skin Abscess A skin abscess is an infected area on or under your skin that contains a collection of pus and other material. An abscess may also be called a furuncle, carbuncle, or boil. An abscess can occur in or on almost any part of your body. Some abscesses break open (rupture) on their own. Most continue to get worse unless they are treated. The infection can spread deeper into the body and eventually into your blood, which can make you feel ill. Treatment usually involves draining the abscess. What are the causes? An abscess occurs when germs, often bacteria, pass through your skin and cause an infection. This may be caused by:  A scrape or cut on your skin.  A puncture wound through your skin, including a needle injection.  Blocked oil or sweat glands.  Blocked and infected hair follicles.  A cyst that forms beneath your skin (sebaceous cyst) and becomes infected.  What increases the risk? This condition is more likely to develop in people who:  Have a weak body defense system (immune system).  Have diabetes.  Have dry and irritated skin.  Get frequent injections or use illegal IV drugs.  Have a foreign body in a wound, such as a splinter.  Have problems with their lymph system or veins.  What are the signs or symptoms? An abscess may start as a painful, firm bump under the skin. Over time, the abscess may get larger or become softer. Pus may appear at the top of the abscess, causing pressure and pain. It may eventually break through the skin and drain. Other symptoms include:  Redness.  Warmth.  Swelling.  Tenderness.  A sore on the skin.  How is this diagnosed? This condition is diagnosed based on your medical history and a physical exam. A sample of pus may be taken from the  abscess to find out what is causing the infection and what antibiotics can be used to treat it. You also may have:  Blood tests to look for signs of infection or spread of an infection to your blood.  Imaging studies such as ultrasound, CT scan, or MRI if the abscess is deep.  How is this treated? Small abscesses that drain on their own may not need treatment. Treatment for an abscess that does not rupture on its own may include:  Warm compresses applied to the area several times per day.  Incision and drainage. Your health care provider will make an incision to open the abscess and will remove pus and any foreign body or dead tissue. The incision area may be packed with gauze to keep it open for a few days while it heals.  Antibiotic medicines to treat infection. For a severe abscess, you may first get antibiotics through an IV and then change to oral antibiotics.  Follow these instructions at home: Abscess Care  If you have an abscess that has not drained, place a warm, clean, wet washcloth over the abscess several times a day. Do this as told by your health care provider.  Follow instructions from your health care provider about how to take care of your abscess. Make sure you: ? Cover the abscess with a bandage (dressing). ? Change your dressing or gauze as told by your health care provider. ? Wash  your hands with soap and water before you change the dressing or gauze. If soap and water are not available, use hand sanitizer.  Check your abscess every day for signs of a worsening infection. Check for: ? More redness, swelling, or pain. ? More fluid or blood. ? Warmth. ? More pus or a bad smell. Medicines  Take over-the-counter and prescription medicines only as told by your health care provider.  If you were prescribed an antibiotic medicine, take it as told by your health care provider. Do not stop taking the antibiotic even if you start to feel better. General  instructions  To avoid spreading the infection: ? Do not share personal care items, towels, or hot tubs with others. ? Avoid making skin contact with other people.  Keep all follow-up visits as told by your health care provider. This is important. Contact a health care provider if:  You have more redness, swelling, or pain around your abscess.  You have more fluid or blood coming from your abscess.  Your abscess feels warm to the touch.  You have more pus or a bad smell coming from your abscess.  You have a fever.  You have muscle aches.  You have chills or a general ill feeling. Get help right away if:  You have severe pain.  You see red streaks on your skin spreading away from the abscess. This information is not intended to replace advice given to you by your health care provider. Make sure you discuss any questions you have with your health care provider. Document Released: 02/10/2005 Document Revised: 12/28/2015 Document Reviewed: 03/12/2015 Elsevier Interactive Patient Education  [employer].

## 2016-11-30 ENCOUNTER — Encounter: Payer: Self-pay | Admitting: Physician Assistant

## 2016-12-01 MED ORDER — ALPRAZOLAM 0.5 MG PO TABS: 0.2500 mg | ORAL_TABLET | Freq: Two times a day (BID) | ORAL | 1 refills | Status: DC | PRN

## 2016-12-01 MED ORDER — HYDROXYCHLOROQUINE SULFATE 200 MG PO TABS: 400.0000 mg | ORAL_TABLET | Freq: Every day | ORAL | 0 refills | Status: DC

## 2016-12-01 MED ORDER — ESCITALOPRAM OXALATE 20 MG PO TABS: 20.0000 mg | ORAL_TABLET | Freq: Every day | ORAL | 0 refills | Status: DC

## 2016-12-06 ENCOUNTER — Other Ambulatory Visit: Payer: Self-pay | Admitting: Physician Assistant

## 2016-12-06 ENCOUNTER — Encounter: Payer: Self-pay | Admitting: Physician Assistant

## 2016-12-06 MED ORDER — CEPHALEXIN 500 MG PO CAPS: 500.0000 mg | ORAL_CAPSULE | Freq: Four times a day (QID) | ORAL | 0 refills | Status: AC

## 2016-12-22 NOTE — Progress Notes (Signed)
Complete Physical  Assessment and Plan: Essential hypertension - continue medications, DASH diet, exercise and monitor at home. Call if greater than 130/80.  - CBC with Differential/Platelet - BASIC METABOLIC PANEL WITH GFR - Hepatic function panel - TSH - Urinalysis, Routine w reflex microscopic (not at [facility]) - Microalbumin / creatinine urine ratio - EKG 12-Lead   Prediabetes Discussed general issues about diabetes pathophysiology and management., Educational material distributed., Suggested low cholesterol diet., Encouraged aerobic exercise., Discussed foot care., Reminded to get yearly retinal exam. - Hemoglobin A1c  Obesity Obesity with co morbidities- long discussion about weight loss, diet, and exercise  Hyperlipidemia -continue medications, check lipids, decrease fatty foods, increase activity.  - Lipid panel   Depression, major, in remission Continue medications - try to decrease xanax use, ,add buspar, if this does not help stop celexa and switch to lexapro.   Medication management - Magnesium  Vitamin D deficiency - Vit D  25 hydroxy (rtn osteoporosis monitoring)   SLE Continue follow up, send [name] labs.    Stress incontinence Follow up Dr. Skip Estimable   Constipation, unspecified constipation type Continue benefiber, water, exercise   Environmental allergies Controlled   RLS Check iron, etc.    OSA Weight loss advised, continue CPAP   Complete physical Due next year  Discussed med's effects and SE's. Screening labs and tests as requested with regular follow-up as recommended.  HPI Patient presents for complete physical.   Patient's blood pressure has been controlled at home. Patient denies chest pain, shortness of breath, dizziness. BP: 128/72  Patient's cholesterol is diet controlled. The patient's cholesterol last visit was  Lab Results  Component Value Date   CHOL 158 07/19/2016   HDL 36 (L) 07/19/2016   LDLCALC 104 (H) 07/19/2016   TRIG  88 07/19/2016   CHOLHDL 4.4 07/19/2016   The patient has been working on diet and exercise for prediabetes, denies changes in vision, polys, and paresthesias. Last A1C in office was Lab Results  Component Value Date   HGBA1C 5.6 07/19/2016   Vitamin D deficiency, on vitamin d Lab Results  Component Value Date   VD25OH 60 07/19/2016   Patient has depression on lexapro. She is on 1/2 xanax a pill 1-2 x a day, at least once at night.  She has SLE and follows with [name], on plaquenil. BMI is Body mass index is 33.36 kg/m., she is working on diet and exercise, she is doing walking and an exercise class at [employer]. She has OSA but could not afford the CPAP but she was given a refurbished CPAP and seeing [facility], has been on x 3 months and can feel a difference with energy and is resting better through the night.  Wt Readings from Last 3 Encounters:  12/23/16 182 lb 6.4 oz (82.7 kg)  11/24/16 185 lb 6.4 oz (84.1 kg)  07/19/16 184 lb 12.8 oz (83.8 kg)    Current Medications:  Current Outpatient Prescriptions on File Prior to Visit  Medication Sig Dispense Refill  . ALPRAZolam (XANAX) 0.5 MG tablet Take 0.5-1 tablets (0.25-0.5 mg total) by mouth 2 (two) times daily as needed. for anxiety 60 tablet 1  . Cholecalciferol (VITAMIN D) 2000 UNITS CAPS Take 1 capsule by mouth daily.    Marland Kitchen escitalopram (LEXAPRO) 20 MG tablet Take 1 tablet (20 mg total) by mouth daily. 90 tablet 0  . hydroxychloroquine (PLAQUENIL) 200 MG tablet Take 2 tablets (400 mg total) by mouth daily. 180 tablet 0  . Magnesium  250 MG TABS Take 250 mg by mouth daily.    . meloxicam (MOBIC) 15 MG tablet TAKE 1 TAB DAILY W/FOOD FOR 2 WEEKS, THEN AS NEEDED, CAN TAKE W/TYLENOL, DON'T TAKE W/ALEVE OR IBU 90 tablet 1   No current facility-administered medications on file prior to visit.    Health Maintenance:   Immunization History  Administered Date(s) Administered  . Pneumococcal Polysaccharide-23 01/07/2016  . Td  05/19/2003  . Tdap 05/23/2013   TDAP: 2015 Pneumovax: 2017 Flu vaccine: declines Prevnar: N/A Zostavax: N/A  Pap: 2016 neg Luciana Axe, NP MGM: 06/2016- category C- neg DEXA: 2007 normal Colonoscopy: Dr. Alice Reichert in [place] 10/2013 EGD:N/A CT head 2005 CXR 2005 MRI Lumbar spine 2007 Echo 2005 DEE 2014  Patient Care Team: [name], MD as PCP - General (Internal Medicine) Signe Colt, MD as Referring Physician (Obstetrics and Gynecology) Kristeen Miss, MD as Consulting Physician (Neurosurgery) Hennie Duos, MD as Consulting Physician (Rheumatology) Efrain Sella, MD as Referring Physician (Internal Medicine) Ara Kussmaul, MD as Consulting Physician (Ophthalmology)  Medical History:  Past Medical History:  Diagnosis Date  . Arthritis   . Cystocele   . Depression   . Hyperlipidemia   . Hypertension    Labile  . Lupus   . Prediabetes   . Seizure ([place])   . Vitamin D deficiency    Allergies Allergies  Allergen Reactions  . Bactrim [Sulfamethoxazole-Trimethoprim]     GI upset  . Cymbalta [Duloxetine Hcl]     Sweating  . Acyclovir And Related Itching    SURGICAL HISTORY She  has a past surgical history that includes Abdominal hysterectomy and Cesarean section. FAMILY HISTORY Her family history includes Heart disease in her mother; Hyperlipidemia in her mother; Hypertension in her mother. SOCIAL HISTORY She  reports that she has never smoked. She has never used smokeless tobacco. She reports that she does not drink alcohol or use drugs.  Review of Systems  Constitutional: Negative.   HENT: Negative.   Eyes: Negative.   Respiratory: Negative.   Cardiovascular: Negative.   Gastrointestinal: Negative for abdominal pain, blood in stool, constipation, diarrhea, heartburn, melena, nausea and vomiting.  Genitourinary: Negative.   Musculoskeletal: Negative for back pain, falls, joint pain, myalgias and neck pain.  Skin: Negative.    Neurological: Negative.  Negative for dizziness.  Endo/Heme/Allergies: Negative.   Psychiatric/Behavioral: The patient is nervous/anxious.     Physical Exam: Estimated body mass index is 33.36 kg/m as calculated from the following:   Height as of this encounter: 5\' 2"  (1.575 m).   Weight as of this encounter: 182 lb 6.4 oz (82.7 kg). Vitals:   12/23/16 0911  BP: 128/72  Resp: 14  Temp: (!) 96.9 F (36.1 C)   General Appearance: Well nourished, in no apparent distress. Eyes: PERRLA, EOMs, conjunctiva no swelling or erythema, normal fundi and vessels. Sinuses: No Frontal/maxillary tenderness ENT/Mouth: Ext aud canals clear, normal light reflex with TMs without erythema, bulging.  Good dentition. No erythema, swelling, or exudate on post pharynx. Tonsils not swollen or erythematous. Hearing normal. Crowded mouth.  Neck: Supple, thyroid normal. No bruits Respiratory: Respiratory effort normal, BS equal bilaterally without rales, rhonchi, wheezing or stridor. Cardio: RRR without murmurs, rubs or gallops. Brisk peripheral pulses without edema.  Chest: symmetric, with normal excursions and percussion. Breasts: defer Abdomen: Soft, obese +BS. Non tender, no guarding, rebound, hernias, masses, or organomegaly. .  Lymphatics: Non tender without lymphadenopathy.  Genitourinary: defer Musculoskeletal: Full ROM all peripheral extremities,5/5 strength,  and normal gait. Skin: Warm, dry without rashes, lesions, ecchymosis.  Neuro: Cranial nerves intact, reflexes equal bilaterally. Normal muscle tone, no cerebellar symptoms. Sensation intact.  Psych: Awake and oriented X 3, normal affect, Insight and Judgment appropriate.   EKG: WNL no changes.    Vicie Mutters 9:43 AM

## 2016-12-23 ENCOUNTER — Encounter: Payer: Self-pay | Admitting: Physician Assistant

## 2016-12-23 ENCOUNTER — Other Ambulatory Visit: Payer: Self-pay | Admitting: Physician Assistant

## 2016-12-23 ENCOUNTER — Ambulatory Visit (INDEPENDENT_AMBULATORY_CARE_PROVIDER_SITE_OTHER): Payer: BC Managed Care – PPO | Admitting: Physician Assistant

## 2016-12-23 VITALS — BP 128/72 | Temp 96.9°F | Resp 14 | Ht 62.0 in | Wt 182.4 lb

## 2016-12-23 DIAGNOSIS — Z0001 Encounter for general adult medical examination with abnormal findings: Secondary | ICD-10-CM

## 2016-12-23 DIAGNOSIS — K5900 Constipation, unspecified: Secondary | ICD-10-CM

## 2016-12-23 DIAGNOSIS — Z0000 Encounter for general adult medical examination without abnormal findings: Secondary | ICD-10-CM | POA: Diagnosis not present

## 2016-12-23 DIAGNOSIS — I10 Essential (primary) hypertension: Secondary | ICD-10-CM

## 2016-12-23 DIAGNOSIS — Z6833 Body mass index (BMI) 33.0-33.9, adult: Secondary | ICD-10-CM

## 2016-12-23 DIAGNOSIS — E6609 Other obesity due to excess calories: Secondary | ICD-10-CM

## 2016-12-23 DIAGNOSIS — N393 Stress incontinence (female) (male): Secondary | ICD-10-CM

## 2016-12-23 DIAGNOSIS — G2581 Restless legs syndrome: Secondary | ICD-10-CM

## 2016-12-23 DIAGNOSIS — Z79899 Other long term (current) drug therapy: Secondary | ICD-10-CM | POA: Diagnosis not present

## 2016-12-23 DIAGNOSIS — E785 Hyperlipidemia, unspecified: Secondary | ICD-10-CM

## 2016-12-23 DIAGNOSIS — Z9109 Other allergy status, other than to drugs and biological substances: Secondary | ICD-10-CM

## 2016-12-23 DIAGNOSIS — R7303 Prediabetes: Secondary | ICD-10-CM

## 2016-12-23 DIAGNOSIS — Z136 Encounter for screening for cardiovascular disorders: Secondary | ICD-10-CM | POA: Diagnosis not present

## 2016-12-23 DIAGNOSIS — D649 Anemia, unspecified: Secondary | ICD-10-CM

## 2016-12-23 DIAGNOSIS — G4733 Obstructive sleep apnea (adult) (pediatric): Secondary | ICD-10-CM

## 2016-12-23 DIAGNOSIS — M329 Systemic lupus erythematosus, unspecified: Secondary | ICD-10-CM

## 2016-12-23 DIAGNOSIS — E559 Vitamin D deficiency, unspecified: Secondary | ICD-10-CM

## 2016-12-23 DIAGNOSIS — F325 Major depressive disorder, single episode, in full remission: Secondary | ICD-10-CM

## 2016-12-23 LAB — LIPID PANEL
Cholesterol: 177 mg/dL (ref <200)
HDL: 40 mg/dL — ABNORMAL LOW (ref >50)
LDL Cholesterol: 115 mg/dL — ABNORMAL HIGH (ref <100)
Total CHOL/HDL Ratio: 4.4 Ratio (ref <5.0)
Triglycerides: 108 mg/dL (ref <150)
VLDL: 22 mg/dL (ref <30)

## 2016-12-23 LAB — HEPATIC FUNCTION PANEL
ALT: 14 U/L (ref 6–29)
AST: 17 U/L (ref 10–35)
Albumin: 4.1 g/dL (ref 3.6–5.1)
Alkaline Phosphatase: 60 U/L (ref 33–130)
Bilirubin, Direct: 0.1 mg/dL (ref <=0.2)
Indirect Bilirubin: 0.3 mg/dL (ref 0.2–1.2)
Total Bilirubin: 0.4 mg/dL (ref 0.2–1.2)
Total Protein: 7.8 g/dL (ref 6.1–8.1)

## 2016-12-23 LAB — CBC WITH DIFFERENTIAL/PLATELET
Basophils Absolute: 0 cells/uL (ref 0–200)
Basophils Relative: 0 %
Eosinophils Absolute: 80 cells/uL (ref 15–500)
Eosinophils Relative: 2 %
HCT: 42.6 % (ref 35.0–45.0)
Hemoglobin: 13.7 g/dL (ref 11.7–15.5)
Lymphocytes Relative: 49 %
Lymphs Abs: 1960 cells/uL (ref 850–3900)
MCH: 29.5 pg (ref 27.0–33.0)
MCHC: 32.2 g/dL (ref 32.0–36.0)
MCV: 91.8 fL (ref 80.0–100.0)
MPV: 9.3 fL (ref 7.5–12.5)
Monocytes Absolute: 520 cells/uL (ref 200–950)
Monocytes Relative: 13 %
Neutro Abs: 1440 cells/uL — ABNORMAL LOW (ref 1500–7800)
Neutrophils Relative %: 36 %
Platelets: 293 10*3/uL (ref 140–400)
RBC: 4.64 MIL/uL (ref 3.80–5.10)
RDW: 13.8 % (ref 11.0–15.0)
WBC: 4 10*3/uL (ref 3.8–10.8)

## 2016-12-23 LAB — TSH: TSH: 1.37 mIU/L

## 2016-12-23 LAB — BASIC METABOLIC PANEL WITH GFR
BUN: 9 mg/dL (ref 7–25)
CO2: 26 mmol/L (ref 20–32)
Calcium: 9.9 mg/dL (ref 8.6–10.4)
Chloride: 104 mmol/L (ref 98–110)
Creat: 0.73 mg/dL (ref 0.50–0.99)
GFR, Est African American: >89 mL/min (ref >=60)
GFR, Est Non African American: 89 mL/min (ref >=60)
Glucose, Bld: 83 mg/dL (ref 65–99)
Potassium: 4.2 mmol/L (ref 3.5–5.3)
Sodium: 140 mmol/L (ref 135–146)

## 2016-12-23 LAB — MAGNESIUM: Magnesium: 1.8 mg/dL (ref 1.5–2.5)

## 2016-12-23 LAB — IRON AND TIBC
%SAT: 21 % (ref 11–50)
Iron: 71 ug/dL (ref 45–160)
TIBC: 342 ug/dL (ref 250–450)
UIBC: 271 ug/dL

## 2016-12-23 NOTE — Patient Instructions (Signed)
Drink 80-100 oz a day of water, measure it out Eat 3 meals a day, have to do breakfast, eat protein- hard boiled eggs, protein bar like [employer] [place] protein bar, greek yogurt like oikos triple zero, chobani 100, or light n fit greek Increase veggies     Bad carbs also include fruit juice, alcohol, and sweet tea. These are empty calories that do not signal to your brain that you are full.   Please remember the good carbs are still carbs which convert into sugar. So please measure them out no more than 1/2-1 cup of rice, oatmeal, pasta, and beans  Veggies are however free foods! Pile them on.   Not all fruit is created equal. Please see the list below, the fruit at the bottom is higher in sugars than the fruit at the top. Please avoid all dried fruits.     Simple math prevails.    1st - exercise does not produce significant weight loss - at best one converts fat into muscle , "bulks up", loses inches, but usually stays "weight neutral"     2nd - think of your body weightas a check book: If you eat more calories than you burn up - you save money or gain weight .... Or if you spend more money than you put in the check book, ie burn up more calories than you eat, then you lose weight     3rd - if you walk or run 1 mile, you burn up 100 calories - you have to burn up 3,500 calories to lose 1 pound, ie you have to walk/run 35 miles to lose 1 measly pound. So if you want to lose 10 #, then you have to walk/run 350 miles, so.... clearly exercise is not the solution.     4. So if you consume 1,500 calories, then you have to burn up the equivalent of 15 miles to stay weight neutral - It also stands to reason that if you consume 1,500 cal/day and don't lose weight, then you must be burning up about 1,500 cals/day to stay weight neutral.     5. If you really want to lose weight, you must cut your calorie intake 300 calories /day and at that rate you should lose about 1 # every 3 days.   6. Please  purchase Dr Fara Olden Fuhrman's book(s) "The End of Dieting" & "Eat to Live" . It has some great concepts and recipes.

## 2016-12-24 ENCOUNTER — Other Ambulatory Visit: Payer: Self-pay | Admitting: Physician Assistant

## 2016-12-24 DIAGNOSIS — R350 Frequency of micturition: Secondary | ICD-10-CM

## 2016-12-24 LAB — VITAMIN B12: Vitamin B-12: 579 pg/mL (ref 200–1100)

## 2016-12-24 LAB — MICROALBUMIN / CREATININE URINE RATIO
Creatinine, Urine: 97 mg/dL (ref 20–320)
Microalb Creat Ratio: 10 mcg/mg creat (ref <30)
Microalb, Ur: 1 mg/dL

## 2016-12-24 LAB — HEMOGLOBIN A1C
Hgb A1c MFr Bld: 5.8 % — ABNORMAL HIGH (ref <5.7)
Mean Plasma Glucose: 120 mg/dL

## 2016-12-24 LAB — URINALYSIS, ROUTINE W REFLEX MICROSCOPIC
Bilirubin Urine: NEGATIVE
Glucose, UA: NEGATIVE
Hgb urine dipstick: NEGATIVE
Ketones, ur: NEGATIVE
Nitrite: POSITIVE — AB
Protein, ur: NEGATIVE
Specific Gravity, Urine: 1.014 (ref 1.001–1.035)
pH: 6.5 (ref 5.0–8.0)

## 2016-12-24 LAB — VITAMIN D 25 HYDROXY (VIT D DEFICIENCY, FRACTURES): Vit D, 25-Hydroxy: 55 ng/mL (ref 30–100)

## 2016-12-24 LAB — URINALYSIS, MICROSCOPIC ONLY
Casts: NONE SEEN [LPF]
Crystals: NONE SEEN [HPF]
RBC / HPF: NONE SEEN RBC/HPF (ref <=2)
Yeast: NONE SEEN [HPF]

## 2016-12-24 MED ORDER — CIPROFLOXACIN HCL 500 MG PO TABS: 500.0000 mg | ORAL_TABLET | Freq: Two times a day (BID) | ORAL | 0 refills | Status: DC

## 2016-12-24 NOTE — Progress Notes (Signed)
LVM for pt to return office call for LAB results. Message sent to front office to schedule:need 1 month lab follow up urine only, labs in epic

## 2016-12-26 LAB — URINE CULTURE

## 2016-12-27 ENCOUNTER — Encounter: Payer: Self-pay | Admitting: Physician Assistant

## 2016-12-27 NOTE — Progress Notes (Signed)
Pt aware of lab results & voiced understanding of those results.

## 2017-01-06 ENCOUNTER — Other Ambulatory Visit: Payer: BC Managed Care – PPO

## 2017-01-06 DIAGNOSIS — R350 Frequency of micturition: Secondary | ICD-10-CM

## 2017-01-06 DIAGNOSIS — N390 Urinary tract infection, site not specified: Secondary | ICD-10-CM

## 2017-01-06 LAB — URINALYSIS, ROUTINE W REFLEX MICROSCOPIC
Bacteria, UA: NONE SEEN /HPF
Bilirubin Urine: NEGATIVE
Glucose, UA: NEGATIVE
Hgb urine dipstick: NEGATIVE
Hyaline Cast: NONE SEEN /LPF
Ketones, ur: NEGATIVE
Nitrite: NEGATIVE
Protein, ur: NEGATIVE
Specific Gravity, Urine: 1.019 (ref 1.001–1.03)
pH: 7 (ref 5.0–8.0)

## 2017-01-07 LAB — URINE CULTURE
MICRO NUMBER:: 80920641
Result:: NO GROWTH
SPECIMEN QUALITY:: ADEQUATE

## 2017-02-27 ENCOUNTER — Other Ambulatory Visit: Payer: Self-pay | Admitting: Physician Assistant

## 2017-03-07 ENCOUNTER — Other Ambulatory Visit: Payer: Self-pay | Admitting: Physician Assistant

## 2017-03-08 NOTE — Telephone Encounter (Signed)
Xanax has been called into pharmacy on 23rd Oct 2018 by DD

## 2017-03-16 LAB — HM DIABETES EYE EXAM

## 2017-03-23 ENCOUNTER — Encounter: Payer: Self-pay | Admitting: Physician Assistant

## 2017-03-23 ENCOUNTER — Other Ambulatory Visit: Payer: Self-pay | Admitting: Physician Assistant

## 2017-03-23 MED ORDER — PHENTERMINE HCL 37.5 MG PO TABS: 37.5000 mg | ORAL_TABLET | Freq: Every day | ORAL | 2 refills | Status: DC

## 2017-03-23 MED ORDER — HYDROXYCHLOROQUINE SULFATE 200 MG PO TABS: 400.0000 mg | ORAL_TABLET | Freq: Every day | ORAL | 1 refills | Status: DC

## 2017-04-20 ENCOUNTER — Other Ambulatory Visit: Payer: Self-pay | Admitting: Physician Assistant

## 2017-04-20 DIAGNOSIS — M25561 Pain in right knee: Principal | ICD-10-CM

## 2017-04-20 DIAGNOSIS — G8929 Other chronic pain: Secondary | ICD-10-CM

## 2017-04-20 MED ORDER — MELOXICAM 15 MG PO TABS: ORAL_TABLET | ORAL | 1 refills | Status: DC

## 2017-04-20 MED ORDER — ALPRAZOLAM 0.5 MG PO TABS: ORAL_TABLET | ORAL | 0 refills | Status: DC

## 2017-04-20 NOTE — Telephone Encounter (Signed)
Xanax has been called into pharmacy on Dec 5th 2018 by DD

## 2017-05-16 ENCOUNTER — Other Ambulatory Visit: Payer: Self-pay | Admitting: Physician Assistant

## 2017-05-16 MED ORDER — CITALOPRAM HYDROBROMIDE 40 MG PO TABS: 40.0000 mg | ORAL_TABLET | Freq: Every day | ORAL | 1 refills | Status: DC

## 2017-05-16 NOTE — Progress Notes (Signed)
Future Appointments  Date Time Provider [place]  06/27/2017  9:30 AM Vicie Mutters, PA-C [facility] None  12/23/2017  9:30 AM Vicie Mutters, PA-C [facility] None

## 2017-06-07 ENCOUNTER — Other Ambulatory Visit: Payer: Self-pay | Admitting: Physician Assistant

## 2017-06-09 ENCOUNTER — Encounter: Payer: Self-pay | Admitting: Physician Assistant

## 2017-06-27 ENCOUNTER — Ambulatory Visit: Payer: Self-pay | Admitting: Physician Assistant

## 2017-06-30 NOTE — Progress Notes (Signed)
Assessment and Plan:  Hypertension -Continue medication, monitor blood pressure at home. Continue DASH diet.  Reminder to go to the ER if any CP, SOB, nausea, dizziness, severe HA, changes vision/speech, left arm numbness and tingling and jaw pain.  Cholesterol -Continue diet and exercise. Check cholesterol.   Prediabetes  -Continue diet and exercise. Check A1C  Vitamin D Def - check level and continue medications.   Morbid Obesity - long discussion about weight loss, diet, and exercise Phentermine given, follow up 3 months  SLE Continue follow up [name], send labs  Continue diet and meds as discussed. Further disposition pending results of labs. Over 30 minutes of exam, counseling, chart review, and critical decision making was performed Future Appointments  Date Time Provider [place]  12/23/2017  9:30 AM Vicie Mutters, PA-C [facility] None     HPI 62 y.o. female  presents for 3 month follow up on hypertension, cholesterol, prediabetes, and vitamin D deficiency.   Her blood pressure has been controlled at home, today their BP is BP: 124/86  She does workout. She denies chest pain, shortness of breath, dizziness.  She is not on cholesterol medication and denies myalgias. Her cholesterol is at goal. The cholesterol last visit was:   Lab Results  Component Value Date   CHOL 177 12/23/2016   HDL 40 (L) 12/23/2016   LDLCALC 115 (H) 12/23/2016   TRIG 108 12/23/2016   CHOLHDL 4.4 12/23/2016    She has been working on diet and exercise for prediabetes, and denies paresthesia of the feet, polydipsia, polyuria and visual disturbances. Last A1C in the office was:  Lab Results  Component Value Date   HGBA1C 5.8 (H) 12/23/2016   Patient is on Vitamin D supplement.   Lab Results  Component Value Date   VD25OH 55 12/23/2016     Patient has depression, she was on lexapro and switched to celexa but feels this is not helping, she has to take more of the xanax during  the day to help with anxiety. Has been on wellbutrin and zoloft in the past that did not help.   She has SLE and follows with [name], is on plaquenil.   BMI is Body mass index is 34.35 kg/m., she is working on diet and exercise. She had a + sleep study in Jan 2016 but on a CPAP and wearing it nightly, it helps with energy.  She takes 1/2 xanax that helps her sleep. She eats 3 meals a day, she does emotionally eat, no diet soda, no sugary drinks, she is drinking 1 gallon of water. She does not eat out often. She does not work out.  Wt Readings from Last 3 Encounters:  07/04/17 187 lb 12.8 oz (85.2 kg)  12/23/16 182 lb 6.4 oz (82.7 kg)  11/24/16 185 lb 6.4 oz (84.1 kg)     Current Medications:  Current Outpatient Medications on File Prior to Visit  Medication Sig Dispense Refill  . ALPRAZolam (XANAX) 0.5 MG tablet TAKE 1/2 TO 1 TABLET AS NEEDED, TRY NOT TO TAKE DAILY, SHOULD BE LAST MORE THAN 1 MONTH 60 tablet 0  . Cholecalciferol (VITAMIN D) 2000 UNITS CAPS Take 1 capsule by mouth daily.    . citalopram (CELEXA) 40 MG tablet Take 1 tablet (40 mg total) by mouth daily. 90 tablet 1  . hydroxychloroquine (PLAQUENIL) 200 MG tablet Take 2 tablets (400 mg total) daily by mouth. 180 tablet 1  . Magnesium 250 MG TABS Take 250 mg by mouth  daily.    . meloxicam (MOBIC) 15 MG tablet TAKE 1 TAB DAILY W/FOOD FOR 2 WEEKS, THEN AS NEEDED, CAN TAKE W/TYLENOL, DON'T TAKE W/ALEVE OR IBU 90 tablet 1   No current facility-administered medications on file prior to visit.    Medical History:  Past Medical History:  Diagnosis Date  . Arthritis   . Cystocele   . Depression   . Hyperlipidemia   . Hypertension    Labile  . Lupus   . Prediabetes   . Seizure ([place])   . Vitamin D deficiency    Allergies:  Allergies  Allergen Reactions  . Bactrim [Sulfamethoxazole-Trimethoprim]     GI upset  . Cymbalta [Duloxetine Hcl]     Sweating  . Acyclovir And Related Itching     Review of Systems:   Review of Systems  Constitutional: Negative.   HENT: Negative.   Eyes: Negative.   Respiratory: Negative.   Cardiovascular: Negative.   Gastrointestinal: Positive for constipation (better with exercise, magnesium) and melena (one episode with mobic). Negative for abdominal pain, blood in stool, diarrhea, heartburn, nausea and vomiting.  Genitourinary: Negative.   Musculoskeletal: Positive for joint pain (right knee pain, feels that her knees are weak that causes imbalance, declines PT, has been better with exericse. ). Negative for back pain, falls, myalgias and neck pain.  Skin: Negative.   Neurological: Negative.  Negative for dizziness.  Endo/Heme/Allergies: Negative.   Psychiatric/Behavioral: Negative.     Family history- Review and unchanged Social history- Review and unchanged Physical Exam: BP 124/86   Temp (!) 97.5 F (36.4 C)   Resp 16   Ht 5\' 2"  (1.575 m)   Wt 187 lb 12.8 oz (85.2 kg)   BMI 34.35 kg/m  Wt Readings from Last 3 Encounters:  07/04/17 187 lb 12.8 oz (85.2 kg)  12/23/16 182 lb 6.4 oz (82.7 kg)  11/24/16 185 lb 6.4 oz (84.1 kg)   General Appearance: Well nourished, in no apparent distress. Eyes: PERRLA, EOMs, conjunctiva no swelling or erythema Sinuses: No Frontal/maxillary tenderness ENT/Mouth: Ext aud canals clear, TMs without erythema, bulging. No erythema, swelling, or exudate on post pharynx.  Tonsils not swollen or erythematous. Hearing normal.  Neck: Supple, thyroid normal.  Respiratory: Respiratory effort normal, BS equal bilaterally without rales, rhonchi, wheezing or stridor.  Cardio: RRR with no MRGs. Brisk peripheral pulses without edema.  Abdomen: Soft, + BS,  Non tender, no guarding, rebound, hernias, masses. Lymphatics: Non tender without lymphadenopathy.  Musculoskeletal: Full ROM, 5/5 strength, Normal gait Skin: Warm, dry without rashes, lesions, ecchymosis.  Neuro: Cranial nerves intact. Normal muscle tone, no cerebellar  symptoms. Psych: Awake and oriented X 3, normal affect, Insight and Judgment appropriate.    Vicie Mutters, PA-C 9:46 AM [facility]

## 2017-07-04 ENCOUNTER — Ambulatory Visit: Payer: BC Managed Care – PPO | Admitting: Physician Assistant

## 2017-07-04 ENCOUNTER — Encounter: Payer: Self-pay | Admitting: Physician Assistant

## 2017-07-04 VITALS — BP 124/86 | Temp 97.5°F | Resp 16 | Ht 62.0 in | Wt 187.8 lb

## 2017-07-04 DIAGNOSIS — E785 Hyperlipidemia, unspecified: Secondary | ICD-10-CM | POA: Diagnosis not present

## 2017-07-04 DIAGNOSIS — R7303 Prediabetes: Secondary | ICD-10-CM

## 2017-07-04 DIAGNOSIS — H6122 Impacted cerumen, left ear: Secondary | ICD-10-CM | POA: Diagnosis not present

## 2017-07-04 DIAGNOSIS — H9202 Otalgia, left ear: Secondary | ICD-10-CM

## 2017-07-04 DIAGNOSIS — M329 Systemic lupus erythematosus, unspecified: Secondary | ICD-10-CM | POA: Diagnosis not present

## 2017-07-04 DIAGNOSIS — Z79899 Other long term (current) drug therapy: Secondary | ICD-10-CM | POA: Diagnosis not present

## 2017-07-04 DIAGNOSIS — F325 Major depressive disorder, single episode, in full remission: Secondary | ICD-10-CM

## 2017-07-04 DIAGNOSIS — I10 Essential (primary) hypertension: Secondary | ICD-10-CM | POA: Diagnosis not present

## 2017-07-04 MED ORDER — VORTIOXETINE HBR 20 MG PO TABS: 20.0000 mg | ORAL_TABLET | Freq: Every day | ORAL | 2 refills | Status: DC

## 2017-07-04 NOTE — Patient Instructions (Addendum)
VENOUS INSUFFICIENCY Our lower leg venous system is not the most reliable, the heart does NOT pump fluid up, there is a valve system.  The muscles of the leg squeeze and the blood moves up and a valve opens and close, then they squeeze, blood moves up and valves open and closes keeping the blood moving towards the heart.  Lots can go wrong with this valve system.  If someone is sitting or standing without movement, everyone will get swelling.  THINGS TO DO:  Do not stand or sit in one position for long periods of time. Do not sit with your legs crossed. Rest with your legs raised during the day.  Your legs have to be higher than your heart so that gravity will force the valves to open, so please really elevate your legs.   Wear elastic stockings or support hose. Do not wear other tight, encircling garments around the legs, pelvis, or waist.  ELASTIC THERAPY  has a wide variety of well priced compression stockings. [place], [place] [address] [address] [place] has a good cheap selection, I like the socks, they are not as hard to get on  Walk as much as possible to increase blood flow.  Raise the foot of your bed at night with 2-inch blocks.  SEEK MEDICAL CARE IF:   The skin around your ankle starts to break down.  You have pain, redness, tenderness, or hard swelling developing in your leg over a vein.  You are uncomfortable due to leg pain.  If you ever have shortness of breath with exertion or chest pain go to the ER.    Being dehydrated can hurt your kidneys, cause fatigue, headaches, muscle aches, joint pain, and dry skin/nails so please increase your fluids.   Drink 80-100 oz a day of water, measure it out! Eat 3 meals a day, have to do breakfast, eat protein- hard boiled eggs, protein bar like [place] protein bar, greek yogurt like oikos triple zero, chobani 100, or light n fit greek  Can check out plantnanny app on your phone to help you keep  track of your water   Veggies are great because you can eat a ton! They are low in calories, great to fill you up, and have a ton of vitamins, minerals, and protein.       Are you an emotional eater? Do you eat more when you're feeling stressed? Do you eat when you're not hungry or when you're full? Do you eat to feel better (to calm and soothe yourself when you're sad, mad, bored, anxious, etc.)? Do you reward yourself with food? Do you regularly eat until you've stuffed yourself? Does food make you feel safe? Do you feel like food is a friend? Do you feel powerless or out of control around food?  If you answered yes to some of these questions than it is likely that you are an emotional eater. This is normally a learned behavior and can take time to first recognize the signs and second BREAK THE HABIT. But here is more information and tips to help.   The difference between emotional hunger and physical hunger Emotional hunger can be powerful, so it's easy to mistake it for physical hunger. But there are clues you can look for to help you tell physical and emotional hunger apart.  Emotional hunger comes on suddenly. It hits you in an instant and feels overwhelming and urgent. Physical hunger, on the other hand,  comes on more gradually. The urge to eat doesn't feel as dire or demand instant satisfaction (unless you haven't eaten for a very long time).  Emotional hunger craves specific comfort foods. When you're physically hungry, almost anything sounds good-including healthy stuff like vegetables. But emotional hunger craves junk food or sugary snacks that provide an instant rush. You feel like you need cheesecake or pizza, and nothing else will do.  Emotional hunger often leads to mindless eating. Before you know it, you've eaten a whole bag of chips or an entire pint of ice cream without really paying attention or fully enjoying it. When you're eating in response to physical hunger,  you're typically more aware of what you're doing.  Emotional hunger isn't satisfied once you're full. You keep wanting more and more, often eating until you're uncomfortably stuffed. Physical hunger, on the other hand, doesn't need to be stuffed. You feel satisfied when your stomach is full.  Emotional hunger isn't located in the stomach. Rather than a growling belly or a pang in your stomach, you feel your hunger as a craving you can't get out of your head. You're focused on specific textures, tastes, and smells.  Emotional hunger often leads to regret, guilt, or shame. When you eat to satisfy physical hunger, you're unlikely to feel guilty or ashamed because you're simply giving your body what it needs. If you feel guilty after you eat, it's likely because you know deep down that you're not eating for nutritional reasons.  Identify your emotional eating triggers What situations, places, or feelings make you reach for the comfort of food? Most emotional eating is linked to unpleasant feelings, but it can also be triggered by positive emotions, such as rewarding yourself for achieving a goal or celebrating a holiday or happy event. Common causes of emotional eating include:  Stuffing emotions - Eating can be a way to temporarily silence or "stuff down" uncomfortable emotions, including anger, fear, sadness, anxiety, loneliness, resentment, and shame. While you're numbing yourself with food, you can avoid the difficult emotions you'd rather not feel.  Boredom or feelings of emptiness - Do you ever eat simply to give yourself something to do, to relieve boredom, or as a way to fill a void in your life? You feel unfulfilled and empty, and food is a way to occupy your mouth and your time. In the moment, it fills you up and distracts you from underlying feelings of purposelessness and dissatisfaction with your life.  Childhood habits - Think back to your childhood memories of food. Did your parents reward  good behavior with ice cream, take you out for pizza when you got a good report card, or serve you sweets when you were feeling sad? These habits can often carry over into adulthood. Or your eating may be driven by nostalgia-for cherished memories of grilling burgers in the backyard with your dad or baking and eating cookies with your mom.  Social influences - Getting together with other people for a meal is a great way to relieve stress, but it can also lead to overeating. It's easy to overindulge simply because the food is there or because everyone else is eating. You may also overeat in social situations out of nervousness. Or perhaps your family or circle of friends encourages you to overeat, and it's easier to go along with the group.  Stress - Ever notice how stress makes you hungry? It's not just in your mind. When stress is chronic, as it so often is  in our chaotic, fast-paced world, your body produces high levels of the stress hormone, cortisol. Cortisol triggers cravings for salty, sweet, and fried foods-foods that give you a burst of energy and pleasure. The more uncontrolled stress in your life, the more likely you are to turn to food for emotional relief.  Find other ways to feed your feelings If you don't know how to manage your emotions in a way that doesn't involve food, you won't be able to control your eating habits for very long. Diets so often fail because they offer logical nutritional advice which only works if you have conscious control over your eating habits. It doesn't work when emotions hijack the process, demanding an immediate payoff with food.  In order to stop emotional eating, you have to find other ways to fulfill yourself emotionally. It's not enough to understand the cycle of emotional eating or even to understand your triggers, although that's a huge first step. You need alternatives to food that you can turn to for emotional fulfillment.  Alternatives to emotional  eating If you're depressed or lonely, call someone who always makes you feel better, play with your dog or cat, or look at a favorite photo or cherished memento.  If you're anxious, expend your nervous energy by dancing to your favorite song, squeezing a stress ball, or taking a brisk walk.  If you're exhausted, treat yourself with a hot cup of tea, take a bath, light some scented candles, or wrap yourself in a warm blanket.  If you're bored, read a good book, watch a comedy show, explore the outdoors, or turn to an activity you enjoy (woodworking, playing the guitar, shooting hoops, scrapbooking, etc.).  What is mindful eating? Mindful eating is a practice that develops your awareness of eating habits and allows you to pause between your triggers and your actions. Most emotional eaters feel powerless over their food cravings. When the urge to eat hits, you feel an almost unbearable tension that demands to be fed, right now. Because you've tried to resist in the past and failed, you believe that your willpower just isn't up to snuff. But the truth is that you have more power over your cravings than you think.  Take 5 before you give in to a craving Emotional eating tends to be automatic and virtually mindless. Before you even realize what you're doing, you've reached for a tub of ice cream and polished off half of it. But if you can take a moment to pause and reflect when you're hit with a craving, you give yourself the opportunity to make a different decision.  Can you put off eating for five minutes? Or just start with one minute. Don't tell yourself you can't give in to the craving; remember, the forbidden is extremely tempting. Just tell yourself to wait.  While you're waiting, check in with yourself. How are you feeling? What's going on emotionally? Even if you end up eating, you'll have a better understanding of why you did it. This can help you set yourself up for a different response next  time.  How to practice mindful eating Eating while you're also doing other things-such as watching TV, driving, or playing with your phone-can prevent you from fully enjoying your food. Since your mind is elsewhere, you may not feel satisfied or continue eating even though you're no longer hungry. Eating more mindfully can help focus your mind on your food and the pleasure of a meal and curb overeating.   Eat  your meals in a calm place with no distractions, aside from any dining companions.  Try eating with your non-dominant hand or using chopsticks instead of a knife and fork. Eating in such a non-familiar way can slow down how fast you eat and ensure your mind stays focused on your food.  Allow yourself enough time not to have to rush your meal. Set a timer for 20 minutes and pace yourself so you spend at least that much time eating.  Take small bites and chew them well, taking time to notice the different flavors and textures of each mouthful.  Put your utensils down between bites. Take time to consider how you feel-hungry, satiated-before picking up your utensils again.  Try to stop eating before you are full.It takes time for the signal to reach your brain that you've had enough. Don't feel obligated to always clean your plate.  When you've finished your food, take a few moments to assess if you're really still hungry before opting for an extra serving or dessert.  Learn to accept your feelings-even the bad ones  While it may seem that the core problem is that you're powerless over food, emotional eating actually stems from feeling powerless over your emotions. You don't feel capable of dealing with your feelings head on, so you avoid them with food.  Recommended reading  Mini Habits for weight loss- get this book  Healthy Eating: A guide to the new nutrition - [employer] Report  10 Tips for Mindful Eating - How mindfulness can help you fully enjoy a meal  and the experience of eating-with moderation and restraint. ([place])  Weight Loss: Gain Control of Emotional Eating - Tips to regain control of your eating habits. [facility])  Why Stress Causes People to Overeat -Tips on controlling stress eating. ([place])  Mindful Eating Meditations -Free online mindfulness meditations. ([facility] [employer] [employer])    8 Critical Weight-Loss Tips That Aren't Diet and Exercise  1. STARVE THE DISTRACTIONS  All too often when we eat, we're also multitasking: watching TV, answering emails, scrolling through social media. These habits are detrimental to having a strong, clear, healthy relationship with food, and they can hinder our ability to make dietary changes.  In order to truly focus on what you're eating, how much you're eating, why you're eating those specific foods and, most importantly, how those foods make you feel, you need to starve the distractions. That means when you eat, just eat. Focus on your food, the process it went through to end up on your plate, where it came from and how it nourishes you. With this technique, you're more likely to finish a meal feeling satiated.  2.  CONSIDER WHAT YOU'RE NOT WILLING TO DO  This might sound counterintuitive, but it can help provide a "why" when motivation is waning. Declare, in writing, what you are unwilling to do, for example "I am unwilling to be the old dad who cannot play sports with my children".  So consider what you're not willing to accept, write it down, and keep it at the ready.  3.  STOP LABELING FOOD "GOOD" AND "BAD"  You've probably heard someone say they ate something "bad." Maybe you've even said it yourself.  The trouble with 'bad' foods isn't that they'll send you to the grave after a bite or two. The trouble comes when we eat excessive portions of really calorie-dense foods meal after meal, day after day.  Instead of labeling foods as good or  bad, think about which foods you can eat a lot of, and which ones you should just eat a little of. Then, plan ways to eat the foods you really like in portions that fit with your overall goals. A good example of this would be having a slice of pizza alongside a club salad with chicken breast, avocado and a bit of dressing. This is vastly different than 3 slices of pizza, 4 breadsticks with cheese sauce and half of a liter of regular soda.  4.  BRUSH YOUR TEETH AFTER YOU EAT  Getting your mindset in order is important, but sometimes small habits can make a big difference. After eating, you still have the taste of food in their mouth, which often causes people to eat more even if they are full or engage in a nibble or two of dessert.  Brushing your teeth will remove the taste of food from your mouth, and the clean, minty freshness will serve as a cue that mealtime is over.  5.  FOCUS ON CROWDING NOT CUTTING  The most common first step during 'dieting' is to cut. We cut our portion sizes down, we cut out 'bad' foods, we cut out entire food groups. This act of cutting puts [place] and our minds into scarcity mode.  When something is off-limits, even if you're able to avoid it for a while, you could end up bingeing on it later because you've gone so long without it. So, instead of cutting, focus on crowding. If you crowd your plate and fill it up with more foods like veggies and protein, it simply allows less room for the other stuff. In other words, shift your focus away from what you can't eat, and celebrate the foods that will help you reach your goals.  6.  TAKE TRACKING A STEP FURTHER  Track what you eat, when you ate it, how much you ate and how that food made you feel. Being completely honest with yourself and writing down every single thing that passes through your lips will help you start to notice that maybe you actually do snack, possibly take in more sugar than you thought, eat when you're bored  rather than just hungry or maybe that you have a habit of snacking before bed while watching TV.  The difference from simply tracking your food intake is you're taking into account how food makes you feel, as well as what you're doing while you're eating. This is about becoming more mindful of what, when and why you eat.  7.  PRIORITIZE GOOD SLEEP  One of the strongest risk factors for being overweight is poor sleep. When you're feeling tired, you're more likely to choose unhealthy comfort foods and to skip your workout. Additionally, sleep deprivation may slow down your metabolism. [employer]! Therefore, sleeping 7-8 hours per night can help with weight loss without having to change your diet or increase your physical activity. And if you feel you snore and still wake up tired, talk with me about sleep apnea.  8.  SET ASIDE TIME TO DISCONNECT  Just get out there. Disconnect from the electronics and connect to the elements. Not only will this help reduce stress (a major factor in weight gain) by giving your mind a break from the constant stimulation we've all become so accustomed to, but it may also reprogram your brain to connect with yourself and what you're feeling.

## 2017-07-05 LAB — CBC WITH DIFFERENTIAL/PLATELET
Basophils Absolute: 19 cells/uL (ref 0–200)
Basophils Relative: 0.5 %
Eosinophils Absolute: 59 cells/uL (ref 15–500)
Eosinophils Relative: 1.6 %
HCT: 40 % (ref 35.0–45.0)
Hemoglobin: 13.2 g/dL (ref 11.7–15.5)
Lymphs Abs: 1698 cells/uL (ref 850–3900)
MCH: 29.3 pg (ref 27.0–33.0)
MCHC: 33 g/dL (ref 32.0–36.0)
MCV: 88.9 fL (ref 80.0–100.0)
MPV: 9.7 fL (ref 7.5–12.5)
Monocytes Relative: 13.1 %
Neutro Abs: 1439 cells/uL — ABNORMAL LOW (ref 1500–7800)
Neutrophils Relative %: 38.9 %
Platelets: 263 10*3/uL (ref 140–400)
RBC: 4.5 10*6/uL (ref 3.80–5.10)
RDW: 12.8 % (ref 11.0–15.0)
Total Lymphocyte: 45.9 %
WBC mixed population: 485 cells/uL (ref 200–950)
WBC: 3.7 10*3/uL — ABNORMAL LOW (ref 3.8–10.8)

## 2017-07-05 LAB — HEMOGLOBIN A1C
Hgb A1c MFr Bld: 6 % of total Hgb — ABNORMAL HIGH (ref <5.7)
Mean Plasma Glucose: 126 (calc)
eAG (mmol/L): 7 (calc)

## 2017-07-05 LAB — LIPID PANEL
Cholesterol: 166 mg/dL (ref <200)
HDL: 39 mg/dL — ABNORMAL LOW (ref >50)
LDL Cholesterol (Calc): 107 mg/dL (calc) — ABNORMAL HIGH
Non-HDL Cholesterol (Calc): 127 mg/dL (calc) (ref <130)
Total CHOL/HDL Ratio: 4.3 (calc) (ref <5.0)
Triglycerides: 107 mg/dL (ref <150)

## 2017-07-05 LAB — HEPATIC FUNCTION PANEL
AG Ratio: 1.1 (calc) (ref 1.0–2.5)
ALT: 13 U/L (ref 6–29)
AST: 17 U/L (ref 10–35)
Albumin: 4 g/dL (ref 3.6–5.1)
Alkaline phosphatase (APISO): 49 U/L (ref 33–130)
Bilirubin, Direct: 0.1 mg/dL (ref 0.0–0.2)
Globulin: 3.5 g/dL (calc) (ref 1.9–3.7)
Indirect Bilirubin: 0.3 mg/dL (calc) (ref 0.2–1.2)
Total Bilirubin: 0.4 mg/dL (ref 0.2–1.2)
Total Protein: 7.5 g/dL (ref 6.1–8.1)

## 2017-07-05 LAB — BASIC METABOLIC PANEL WITH GFR
BUN: 13 mg/dL (ref 7–25)
CO2: 27 mmol/L (ref 20–32)
Calcium: 9.6 mg/dL (ref 8.6–10.4)
Chloride: 106 mmol/L (ref 98–110)
Creat: 0.75 mg/dL (ref 0.50–0.99)
GFR, Est African American: 99 mL/min/{1.73_m2} (ref >=60)
GFR, Est Non African American: 85 mL/min/{1.73_m2} (ref >=60)
Glucose, Bld: 77 mg/dL (ref 65–99)
Potassium: 5.3 mmol/L (ref 3.5–5.3)
Sodium: 139 mmol/L (ref 135–146)

## 2017-07-05 LAB — TSH: TSH: 1.36 mIU/L (ref 0.40–4.50)

## 2017-07-08 ENCOUNTER — Other Ambulatory Visit: Payer: Self-pay | Admitting: Physician Assistant

## 2017-07-11 ENCOUNTER — Encounter: Payer: Self-pay | Admitting: Physician Assistant

## 2017-07-19 ENCOUNTER — Encounter: Payer: Self-pay | Admitting: Physician Assistant

## 2017-07-27 ENCOUNTER — Encounter: Payer: Self-pay | Admitting: Physician Assistant

## 2017-08-01 ENCOUNTER — Other Ambulatory Visit: Payer: Self-pay | Admitting: Physician Assistant

## 2017-08-01 MED ORDER — ESCITALOPRAM OXALATE 20 MG PO TABS: 20.0000 mg | ORAL_TABLET | Freq: Every day | ORAL | 2 refills | Status: DC

## 2017-08-03 ENCOUNTER — Encounter: Payer: Self-pay | Admitting: Physician Assistant

## 2017-08-10 ENCOUNTER — Ambulatory Visit: Payer: BC Managed Care – PPO | Admitting: Physician Assistant

## 2017-08-10 ENCOUNTER — Encounter: Payer: Self-pay | Admitting: Physician Assistant

## 2017-08-10 VITALS — BP 124/76 | Temp 97.6°F | Resp 16 | Ht 62.0 in | Wt 183.6 lb

## 2017-08-10 DIAGNOSIS — K219 Gastro-esophageal reflux disease without esophagitis: Secondary | ICD-10-CM | POA: Diagnosis not present

## 2017-08-10 DIAGNOSIS — R350 Frequency of micturition: Secondary | ICD-10-CM | POA: Diagnosis not present

## 2017-08-10 DIAGNOSIS — I10 Essential (primary) hypertension: Secondary | ICD-10-CM | POA: Diagnosis not present

## 2017-08-10 DIAGNOSIS — F325 Major depressive disorder, single episode, in full remission: Secondary | ICD-10-CM

## 2017-08-10 DIAGNOSIS — R5383 Other fatigue: Secondary | ICD-10-CM | POA: Diagnosis not present

## 2017-08-10 DIAGNOSIS — M329 Systemic lupus erythematosus, unspecified: Secondary | ICD-10-CM

## 2017-08-10 MED ORDER — OMEPRAZOLE 40 MG PO CPDR: 40.0000 mg | DELAYED_RELEASE_CAPSULE | Freq: Every day | ORAL | 0 refills | Status: DC

## 2017-08-10 MED ORDER — BUPROPION HCL ER (XL) 150 MG PO TB24: 150.0000 mg | ORAL_TABLET | ORAL | 2 refills | Status: DC

## 2017-08-10 NOTE — Progress Notes (Signed)
Subjective:    Patient ID: Beth [name], female    DOB: [birth year], 62 y.o.   MRN: [id number]  HPI 62 y.o. obese AAF with history of HTN, SLE, and depression presents with fatigue, not eating well, and increasing xanax use for at least 2 weeks.   She is taking xanax 1 pill at least 2 x a day.   She has no motivation, no energy. Some cyring/irritaiblity. She has had chills, some epigastric discomfort, bloating, some nausea but no vomiting. Still has gallbladder. Had diarrhea x 1 but has resolved. No chest pain, no shortness of breath, sometimes hard to get deep breath while sitting and has had heart burn. States she has decreased urination.   She was on wellbutrin/celexa 2015-2017, came in 2017 with complaints of depression/menopausal symptoms, switch to lexapro which she states was working for about 8-10 months until 07/04/2017, she was increasing xanax use and very depressed, tried to switch to trintellix which she had AE's with and then to [place] which she had AEs with, most recently back on lexapro for only 1 week. She is not seeing a Social worker. No thoughts of SI/HI.   Blood pressure 124/76, temperature 97.6 F (36.4 C), resp. rate 16, height 5\' 2"  (1.575 m), weight 183 lb 9.6 oz (83.3 kg).  Medications Current Outpatient Medications on File Prior to Visit  Medication Sig  . ALPRAZolam (XANAX) 0.5 MG tablet TAKE 1/2 TO 1 TABLET AS NEEDED, TRY NOT TO TAKE DAILY, SHOULD LAST MORE THAN 1 MONTH  . Cholecalciferol (VITAMIN D) 2000 UNITS CAPS Take 1 capsule by mouth daily.  Marland Kitchen escitalopram (LEXAPRO) 20 MG tablet Take 1 tablet (20 mg total) by mouth daily.  . hydroxychloroquine (PLAQUENIL) 200 MG tablet Take 2 tablets (400 mg total) daily by mouth.  . Magnesium 250 MG TABS Take 250 mg by mouth daily.  . meloxicam (MOBIC) 15 MG tablet TAKE 1 TAB DAILY W/FOOD FOR 2 WEEKS, THEN AS NEEDED, CAN TAKE W/TYLENOL, DON'T TAKE W/ALEVE OR IBU   No current facility-administered medications on file prior  to visit.     Problem list She has Depression, major, in remission ([place]); Environmental allergies; Constipation; SLE; Hypertension; Prediabetes; Hyperlipidemia; Vitamin D deficiency; Obesity; Stress incontinence; Medication management; OSA (obstructive sleep apnea); and RLS (restless legs syndrome) on their problem list.  Review of Systems See HPI    Objective:   Physical Exam  Constitutional: She is oriented to person, place, and time. She appears well-developed and well-nourished.  HENT:  Head: Normocephalic and atraumatic.  Right Ear: External ear normal.  Left Ear: External ear normal.  Mouth/Throat: Oropharynx is clear and moist.  Eyes: Pupils are equal, round, and reactive to light. Conjunctivae and EOM are normal.  Neck: Normal range of motion. Neck supple. No thyromegaly present.  Cardiovascular: Normal rate, regular rhythm and normal heart sounds. Exam reveals no gallop and no friction rub.  No murmur heard. Pulmonary/Chest: Effort normal and breath sounds normal. No respiratory distress. She has no wheezes.  Abdominal: Soft. Bowel sounds are normal. She exhibits no shifting dullness, no distension, no abdominal bruit, no pulsatile midline mass and no mass. There is no hepatosplenomegaly. There is tenderness in the epigastric area. There is no rigidity, no rebound, no guarding, no CVA tenderness, no tenderness at McBurney's point and negative Murphy's sign. No hernia.  Musculoskeletal: Normal range of motion.  Lymphadenopathy:    She has no cervical adenopathy.  Neurological: She is alert and oriented to person, place, and time.  Skin:  Skin is warm and dry.  Psychiatric: Her speech is normal and behavior is normal. Judgment and thought content normal. Cognition and memory are normal. She exhibits a depressed mood. She expresses no homicidal and no suicidal ideation. She expresses no suicidal plans and no homicidal plans.        Assessment & Plan:   Essential  hypertension - continue medications, DASH diet, exercise and monitor at home. Call if greater than 130/80.  -     CBC with Differential/Platelet -     BASIC METABOLIC PANEL WITH GFR -     Hepatic function panel -     TSH  Morbid obesity ([place]) - follow up 3 months for progress monitoring - increase veggies, decrease carbs - long discussion about weight loss, diet, and exercise  Systemic lupus erythematosus, unspecified SLE type, unspecified organ involvement status ([place]) Continue follow up rheum  Depression, major, in remission ([place]) -     buPROPion (WELLBUTRIN XL) 150 MG 24 hr tablet; Take 1 tablet (150 mg total) by mouth every morning. - continue lexapro, start wellbutrin, increase exercise, find counselor  Urinary frequency -     Urinalysis, Routine w reflex microscopic -     Urine Culture  Fatigue, unspecified type -     Iron,Total/Total Iron Binding Cap -     Vitamin B12  Gastroesophageal reflux disease without esophagitis -     omeprazole (PRILOSEC) 40 MG capsule; Take 1 capsule (40 mg total) by mouth daily. - ? 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     Close follow up 1 week Over 40 mins spent with the patient

## 2017-08-10 NOTE — Patient Instructions (Addendum)
Continue lexapro will add on wellbutrin 150mg   Suggest seeing counselor Suggest walking 1-2 x a week  Get on prilosec x 2 weeks, then zantac over the counter ,generic is fine  Take omeprazole over the counter for 2 weeks, then go to zantac 150-300 mg OR pepcid 20 or 40mg  at night for 2 weeks, then you can stop or continue as needed.  Avoid alcohol, spicy foods, NSAIDS (aleve, ibuprofen) at this time. See foods below.   Food Choices for Gastroesophageal Reflux Disease When you have gastroesophageal reflux disease (GERD), the foods you eat and your eating habits are very important. Choosing the right foods can help ease the discomfort of GERD. WHAT GENERAL GUIDELINES DO I NEED TO FOLLOW?  Choose fruits, vegetables, whole grains, low-fat dairy products, and low-fat meat, fish, and poultry.  Limit fats such as oils, salad dressings, butter, nuts, and avocado.  Keep a food diary to identify foods that cause symptoms.  Avoid foods that cause reflux. These may be different for different people.  Eat frequent small meals instead of three large meals each day.  Eat your meals slowly, in a relaxed setting.  Limit fried foods.  Cook foods using methods other than frying.  Avoid drinking alcohol.  Avoid drinking large amounts of liquids with your meals.  Avoid bending over or lying down until 2-3 hours after eating. WHAT FOODS ARE NOT RECOMMENDED? The following are some foods and drinks that may worsen your symptoms: Vegetables Tomatoes. Tomato juice. Tomato and spaghetti sauce. Chili peppers. Onion and garlic. Horseradish. Fruits Oranges, grapefruit, and lemon (fruit and juice). Meats High-fat meats, fish, and poultry. This includes hot dogs, ribs, ham, sausage, salami, and bacon. Dairy Whole milk and chocolate milk. Sour cream. Cream. Butter. Ice cream. Cream cheese.  Beverages Coffee and tea, with or without caffeine. Carbonated beverages or energy drinks. Condiments Hot sauce.  Barbecue sauce.  Sweets/Desserts Chocolate and cocoa. Donuts. Peppermint and spearmint. Fats and Oils High-fat foods, including [place] fries and potato chips. Other Vinegar. Strong spices, such as black pepper, white pepper, red pepper, cayenne, curry powder, cloves, ginger, and chili powder.  Counseling services  Here are some numbers below you can try but I suggest calling your insurance and finding out who is in your network and THEN calling those people or looking them up on google.   I'm a big fan of Cognitive Behavioral Therapy, look this up on You tube or check with the therapist you see if they are certified.  This form of therapy helps to teach you skills to better handle with current situation that are causing anxiety or depression.   [facility] [place], NP [place].  Suite [phone] Fax [phone]  [facility] Hours: Monday-Thursday 830-8pm  Friday 830AM-7PM Address: [address] Phone:([phone] [place].  Address: [place], [place] [address] [place] [facility] [phone] office [email or web address] [address] [place], [place], [place] [address]  Dr. Arbutus Leas, Ph.D. [address] [place] [address] Phone: [phone], [place] [phone] [place] [address], [place] [address]   Toy Cookey, MA, clinical psychologist  Cognitive-Behavior Therapy; Mood Disorders; Anxiety Disorders; adult and child ADHD; Family Therapy; Stress Management; personal growth, and Marital Therapy.    Terrance Mass Ph.D., clinical psychologist Cognitive-Behavior Therapy; Mood Disorders; Anxiety Disorders; Stress     Management  [facility]  [address] [place], [place] [address] [phone]   [facility], psychotherapist [address] [place], [place] [address] [phone]  Karin Golden Ph.D., clinical psychologist [phone] office [place], [place] [address] Cognitive Behavior Therapy, Depression, Bipolar, Anxiety, Grief and Loss

## 2017-08-12 ENCOUNTER — Other Ambulatory Visit: Payer: Self-pay | Admitting: Physician Assistant

## 2017-08-12 ENCOUNTER — Encounter: Payer: Self-pay | Admitting: Physician Assistant

## 2017-08-12 LAB — BASIC METABOLIC PANEL WITH GFR
BUN: 9 mg/dL (ref 7–25)
CO2: 30 mmol/L (ref 20–32)
Calcium: 9.7 mg/dL (ref 8.6–10.4)
Chloride: 104 mmol/L (ref 98–110)
Creat: 0.75 mg/dL (ref 0.50–0.99)
GFR, Est African American: 99 mL/min/{1.73_m2} (ref >=60)
GFR, Est Non African American: 85 mL/min/{1.73_m2} (ref >=60)
Glucose, Bld: 94 mg/dL (ref 65–99)
Potassium: 4.5 mmol/L (ref 3.5–5.3)
Sodium: 140 mmol/L (ref 135–146)

## 2017-08-12 LAB — CBC WITH DIFFERENTIAL/PLATELET
Basophils Absolute: 31 cells/uL (ref 0–200)
Basophils Relative: 0.6 %
Eosinophils Absolute: 31 cells/uL (ref 15–500)
Eosinophils Relative: 0.6 %
HCT: 44.1 % (ref 35.0–45.0)
Hemoglobin: 14.4 g/dL (ref 11.7–15.5)
Lymphs Abs: 1729 cells/uL (ref 850–3900)
MCH: 29.1 pg (ref 27.0–33.0)
MCHC: 32.7 g/dL (ref 32.0–36.0)
MCV: 89.3 fL (ref 80.0–100.0)
MPV: 10 fL (ref 7.5–12.5)
Monocytes Relative: 9.6 %
Neutro Abs: 2820 cells/uL (ref 1500–7800)
Neutrophils Relative %: 55.3 %
Platelets: 266 10*3/uL (ref 140–400)
RBC: 4.94 10*6/uL (ref 3.80–5.10)
RDW: 12.3 % (ref 11.0–15.0)
Total Lymphocyte: 33.9 %
WBC mixed population: 490 cells/uL (ref 200–950)
WBC: 5.1 10*3/uL (ref 3.8–10.8)

## 2017-08-12 LAB — HEPATIC FUNCTION PANEL
AG Ratio: 1.1 (calc) (ref 1.0–2.5)
ALT: 13 U/L (ref 6–29)
AST: 15 U/L (ref 10–35)
Albumin: 4.2 g/dL (ref 3.6–5.1)
Alkaline phosphatase (APISO): 55 U/L (ref 33–130)
Bilirubin, Direct: 0.1 mg/dL (ref 0.0–0.2)
Globulin: 3.7 g/dL (calc) (ref 1.9–3.7)
Indirect Bilirubin: 0.3 mg/dL (calc) (ref 0.2–1.2)
Total Bilirubin: 0.4 mg/dL (ref 0.2–1.2)
Total Protein: 7.9 g/dL (ref 6.1–8.1)

## 2017-08-12 LAB — URINALYSIS, ROUTINE W REFLEX MICROSCOPIC
Bilirubin Urine: NEGATIVE
Glucose, UA: NEGATIVE
Hgb urine dipstick: NEGATIVE
Ketones, ur: NEGATIVE
Nitrite: NEGATIVE
Protein, ur: NEGATIVE
Specific Gravity, Urine: 1.014 (ref 1.001–1.03)
pH: 6.5 (ref 5.0–8.0)

## 2017-08-12 LAB — IRON, TOTAL/TOTAL IRON BINDING CAP
%SAT: 18 % (calc) (ref 11–50)
Iron: 57 ug/dL (ref 45–160)
TIBC: 319 mcg/dL (calc) (ref 250–450)

## 2017-08-12 LAB — URINE CULTURE
MICRO NUMBER:: 90385309
SPECIMEN QUALITY:: ADEQUATE

## 2017-08-12 LAB — TSH: TSH: 1.45 mIU/L (ref 0.40–4.50)

## 2017-08-12 LAB — VITAMIN B12: Vitamin B-12: 568 pg/mL (ref 200–1100)

## 2017-08-12 MED ORDER — CEFUROXIME AXETIL 250 MG PO TABS: 250.0000 mg | ORAL_TABLET | Freq: Two times a day (BID) | ORAL | 0 refills | Status: DC

## 2017-08-15 NOTE — Progress Notes (Signed)
Assessment and Plan:  Epigastric pain and Black tarry stools Likely NSAID incuded PUD  Prilsec BID for 5 days then once a day, no NSAIDS, diet discussed Will get stat lab Negative hemoccult here in the office Benign AB exam Please go to the ER if you have any severe AB pain, unable to hold down food/water, blood in stool or vomit, chest pain, shortness of breath, or any worsening symptoms.  -     CBC with Differential/Platelet -     BASIC METABOLIC PANEL WITH GFR -     Amylase -     H. pylori breath test    HPI 62 y.o.female presents for follow up for depression, UTI and GERD.  She was started on lexapro 20 and wellbutrin last visit, treated for UTI, and her GERD, back for close follow up.   She has been off mobic x 1 month but was on it daily for 3 months, she has not started the prilosec yet. No OTC aleve, ibuprofen, no ETOH.   She states she has had black, tarry stools x yesterday before going to bed, she has epigastric pain but some generally AB pain as well, very tired this AM, not eating well with decreased appeite. She also had BRBPR on TP yesterday. No fever, chills.  Last colonoscopy 2015, has never had EGD. CT AB 2001  Lab Results  Component Value Date   WBC 5.1 08/10/2017   HGB 14.4 08/10/2017   HCT 44.1 08/10/2017   MCV 89.3 08/10/2017   PLT 266 08/10/2017     Past Medical History:  Diagnosis Date  . Arthritis   . Cystocele   . Depression   . Hyperlipidemia   . Hypertension    Labile  . Lupus ([place])   . Prediabetes   . Seizure ([place])   . Vitamin D deficiency      Allergies  Allergen Reactions  . Bactrim [Sulfamethoxazole-Trimethoprim]     GI upset  . Cymbalta [Duloxetine Hcl]     Sweating  . Acyclovir And Related Itching    Current Outpatient Medications on File Prior to Visit  Medication Sig  . ALPRAZolam (XANAX) 0.5 MG tablet TAKE 1/2 TO 1 TABLET AS NEEDED, TRY NOT TO TAKE DAILY, SHOULD LAST MORE THAN 1 MONTH  . buPROPion (WELLBUTRIN XL)  150 MG 24 hr tablet Take 1 tablet (150 mg total) by mouth every morning.  . cefUROXime (CEFTIN) 250 MG tablet Take 1 tablet (250 mg total) by mouth 2 (two) times daily.  . Cholecalciferol (VITAMIN D) 2000 UNITS CAPS Take 1 capsule by mouth daily.  Marland Kitchen escitalopram (LEXAPRO) 20 MG tablet Take 1 tablet (20 mg total) by mouth daily.  . hydroxychloroquine (PLAQUENIL) 200 MG tablet Take 2 tablets (400 mg total) daily by mouth.  . Magnesium 250 MG TABS Take 250 mg by mouth daily.  . meloxicam (MOBIC) 15 MG tablet TAKE 1 TAB DAILY W/FOOD FOR 2 WEEKS, THEN AS NEEDED, CAN TAKE W/TYLENOL, DON'T TAKE W/ALEVE OR IBU  . omeprazole (PRILOSEC) 40 MG capsule Take 1 capsule (40 mg total) by mouth daily.   No current facility-administered medications on file prior to visit.     ROS: all negative except above.   Physical Exam: Filed Weights   08/17/17 1028  Weight: 183 lb 12.8 oz (83.4 kg)   BP 136/80   Temp (!) 97.5 F (36.4 C)   Resp 14   Ht 5\' 2"  (1.575 m)   Wt 183 lb 12.8 oz (83.4  kg)   BMI 33.62 kg/m  General Appearance: Well nourished, in no apparent distress. Eyes: PERRLA, EOMs, conjunctiva no swelling or erythema Sinuses: No Frontal/maxillary tenderness ENT/Mouth: Ext aud canals clear, TMs without erythema, bulging. No erythema, swelling, or exudate on post pharynx.  Tonsils not swollen or erythematous. Hearing normal.  Neck: Supple, thyroid normal.  Respiratory: Respiratory effort normal, BS equal bilaterally without rales, rhonchi, wheezing or stridor.  Cardio: RRR with no MRGs. Brisk peripheral pulses without edema.  Abdomen: Soft, + BS.  Mild epigastric tenderness, no guarding, rebound, hernias, masses. Lymphatics: Non tender without lymphadenopathy.  Musculoskeletal: Full ROM, 5/5 strength, normal gait.  Skin: Warm, dry without rashes, lesions, ecchymosis.  Neuro: Cranial nerves intact. Normal muscle tone, no cerebellar symptoms. Sensation intact.  Psych: Awake and oriented X 3, normal  affect, Insight and Judgment appropriate.     Vicie Mutters, PA-C 12:31 PM [facility]

## 2017-08-17 ENCOUNTER — Ambulatory Visit: Payer: BC Managed Care – PPO | Admitting: Physician Assistant

## 2017-08-17 ENCOUNTER — Other Ambulatory Visit: Payer: Self-pay | Admitting: Physician Assistant

## 2017-08-17 ENCOUNTER — Encounter: Payer: Self-pay | Admitting: Physician Assistant

## 2017-08-17 VITALS — BP 136/80 | Temp 97.5°F | Resp 14 | Ht 62.0 in | Wt 183.8 lb

## 2017-08-17 DIAGNOSIS — R1013 Epigastric pain: Secondary | ICD-10-CM | POA: Diagnosis not present

## 2017-08-17 DIAGNOSIS — K921 Melena: Secondary | ICD-10-CM

## 2017-08-17 NOTE — Patient Instructions (Addendum)
Get on prilosec take one in AM 30 mins before food and 1 in PM for 5 days Then just go to one in the morning  Please go to the ER if you have any severe AB pain, unable to hold down food/water, blood in stool or vomit, chest pain, shortness of breath, or any worsening symptoms.    Avoid alcohol, spicy foods, NSAIDS (aleve, ibuprofen) at this time. See foods below.  Can take tylenol You can take tylenol (500mg ) or tylenol arthritis (650mg ) with the meloxicam/antiinflammatories. The max you can take of tylenol a day is 3000mg  daily, this is a max of 6 pills a day of the regular tyelnol (500mg ) or a max of 4 a day of the tylenol arthritis (650mg ) as long as no other medications you are taking contain tylenol.    Food Choices for Gastroesophageal Reflux Disease When you have gastroesophageal reflux disease (GERD), the foods you eat and your eating habits are very important. Choosing the right foods can help ease the discomfort of GERD. WHAT GENERAL GUIDELINES DO I NEED TO FOLLOW?  Choose fruits, vegetables, whole grains, low-fat dairy products, and low-fat meat, fish, and poultry.  Limit fats such as oils, salad dressings, butter, nuts, and avocado.  Keep a food diary to identify foods that cause symptoms.  Avoid foods that cause reflux. These may be different for different people.  Eat frequent small meals instead of three large meals each day.  Eat your meals slowly, in a relaxed setting.  Limit fried foods.  Cook foods using methods other than frying.  Avoid drinking alcohol.  Avoid drinking large amounts of liquids with your meals.  Avoid bending over or lying down until 2-3 hours after eating. WHAT FOODS ARE NOT RECOMMENDED? The following are some foods and drinks that may worsen your symptoms: Vegetables Tomatoes. Tomato juice. Tomato and spaghetti sauce. Chili peppers. Onion and garlic. Horseradish. Fruits Oranges, grapefruit, and lemon (fruit and juice). Meats High-fat  meats, fish, and poultry. This includes hot dogs, ribs, ham, sausage, salami, and bacon. Dairy Whole milk and chocolate milk. Sour cream. Cream. Butter. Ice cream. Cream cheese.  Beverages Coffee and tea, with or without caffeine. Carbonated beverages or energy drinks. Condiments Hot sauce. Barbecue sauce.  Sweets/Desserts Chocolate and cocoa. Donuts. Peppermint and spearmint. Fats and Oils High-fat foods, including [place] fries and potato chips. Other Vinegar. Strong spices, such as black pepper, white pepper, red pepper, cayenne, curry powder, cloves, ginger, and chili powder.

## 2017-08-18 LAB — CBC WITH DIFFERENTIAL/PLATELET
Basophils Absolute: 9 cells/uL (ref 0–200)
Basophils Relative: 0.2 %
Eosinophils Absolute: 59 cells/uL (ref 15–500)
Eosinophils Relative: 1.3 %
HCT: 41 % (ref 35.0–45.0)
Hemoglobin: 14 g/dL (ref 11.7–15.5)
Lymphs Abs: 1841 cells/uL (ref 850–3900)
MCH: 29.9 pg (ref 27.0–33.0)
MCHC: 34.1 g/dL (ref 32.0–36.0)
MCV: 87.6 fL (ref 80.0–100.0)
MPV: 9.9 fL (ref 7.5–12.5)
Monocytes Relative: 11.6 %
Neutro Abs: 2070 cells/uL (ref 1500–7800)
Neutrophils Relative %: 46 %
Platelets: 264 10*3/uL (ref 140–400)
RBC: 4.68 10*6/uL (ref 3.80–5.10)
RDW: 12.5 % (ref 11.0–15.0)
Total Lymphocyte: 40.9 %
WBC mixed population: 522 cells/uL (ref 200–950)
WBC: 4.5 10*3/uL (ref 3.8–10.8)

## 2017-08-18 LAB — BASIC METABOLIC PANEL WITH GFR
BUN: 12 mg/dL (ref 7–25)
CO2: 28 mmol/L (ref 20–32)
Calcium: 9.4 mg/dL (ref 8.6–10.4)
Chloride: 104 mmol/L (ref 98–110)
Creat: 0.68 mg/dL (ref 0.50–0.99)
GFR, Est African American: 109 mL/min/{1.73_m2} (ref >=60)
GFR, Est Non African American: 94 mL/min/{1.73_m2} (ref >=60)
Glucose, Bld: 84 mg/dL (ref 65–99)
Potassium: 4.6 mmol/L (ref 3.5–5.3)
Sodium: 138 mmol/L (ref 135–146)

## 2017-08-18 LAB — H. PYLORI BREATH TEST: H. pylori Breath Test: NOT DETECTED

## 2017-08-18 LAB — AMYLASE: Amylase: 50 U/L (ref 21–101)

## 2017-08-19 ENCOUNTER — Encounter: Payer: Self-pay | Admitting: Physician Assistant

## 2017-08-19 ENCOUNTER — Encounter (INDEPENDENT_AMBULATORY_CARE_PROVIDER_SITE_OTHER): Payer: Self-pay

## 2017-08-20 ENCOUNTER — Emergency Department (HOSPITAL_COMMUNITY): Payer: BC Managed Care – PPO

## 2017-08-20 ENCOUNTER — Encounter (HOSPITAL_COMMUNITY): Payer: Self-pay | Admitting: Emergency Medicine

## 2017-08-20 ENCOUNTER — Emergency Department (HOSPITAL_COMMUNITY)
Admission: EM | Admit: 2017-08-20 | Discharge: 2017-08-20 | Disposition: A | Discharge status: Home / Self Care | Payer: BC Managed Care – PPO | Attending: Emergency Medicine | Admitting: Emergency Medicine

## 2017-08-20 DIAGNOSIS — K6289 Other specified diseases of anus and rectum: Secondary | ICD-10-CM | POA: Insufficient documentation

## 2017-08-20 DIAGNOSIS — R1084 Generalized abdominal pain: Secondary | ICD-10-CM | POA: Diagnosis present

## 2017-08-20 DIAGNOSIS — Z79899 Other long term (current) drug therapy: Secondary | ICD-10-CM | POA: Diagnosis not present

## 2017-08-20 DIAGNOSIS — C642 Malignant neoplasm of left kidney, except renal pelvis: Secondary | ICD-10-CM | POA: Diagnosis not present

## 2017-08-20 DIAGNOSIS — R630 Anorexia: Secondary | ICD-10-CM | POA: Insufficient documentation

## 2017-08-20 DIAGNOSIS — I10 Essential (primary) hypertension: Secondary | ICD-10-CM | POA: Diagnosis not present

## 2017-08-20 LAB — URINALYSIS, ROUTINE W REFLEX MICROSCOPIC
Bilirubin Urine: NEGATIVE
Glucose, UA: NEGATIVE mg/dL
Hgb urine dipstick: NEGATIVE
Ketones, ur: NEGATIVE mg/dL
Nitrite: NEGATIVE
Protein, ur: NEGATIVE mg/dL
Specific Gravity, Urine: 1.02 (ref 1.005–1.030)
pH: 6.5 (ref 5.0–8.0)

## 2017-08-20 LAB — COMPREHENSIVE METABOLIC PANEL
ALT: 16 U/L (ref 14–54)
AST: 17 U/L (ref 15–41)
Albumin: 3.9 g/dL (ref 3.5–5.0)
Alkaline Phosphatase: 48 U/L (ref 38–126)
Anion gap: 10 (ref 5–15)
BUN: 10 mg/dL (ref 6–20)
CO2: 27 mmol/L (ref 22–32)
Calcium: 9.4 mg/dL (ref 8.9–10.3)
Chloride: 105 mmol/L (ref 101–111)
Creatinine, Ser: 0.77 mg/dL (ref 0.44–1.00)
GFR calc Af Amer: >60 mL/min (ref >60)
GFR calc non Af Amer: >60 mL/min (ref >60)
Glucose, Bld: 100 mg/dL — ABNORMAL HIGH (ref 65–99)
Potassium: 4.2 mmol/L (ref 3.5–5.1)
Sodium: 142 mmol/L (ref 135–145)
Total Bilirubin: 0.6 mg/dL (ref 0.3–1.2)
Total Protein: 8.1 g/dL (ref 6.5–8.1)

## 2017-08-20 LAB — POC OCCULT BLOOD, ED: Fecal Occult Bld: POSITIVE — AB

## 2017-08-20 LAB — CBC
HCT: 42.3 % (ref 36.0–46.0)
Hemoglobin: 13.8 g/dL (ref 12.0–15.0)
MCH: 29.9 pg (ref 26.0–34.0)
MCHC: 32.6 g/dL (ref 30.0–36.0)
MCV: 91.6 fL (ref 78.0–100.0)
Platelets: 288 10*3/uL (ref 150–400)
RBC: 4.62 MIL/uL (ref 3.87–5.11)
RDW: 13.3 % (ref 11.5–15.5)
WBC: 4.4 10*3/uL (ref 4.0–10.5)

## 2017-08-20 LAB — URINALYSIS, MICROSCOPIC (REFLEX): RBC / HPF: NONE SEEN RBC/hpf (ref 0–5)

## 2017-08-20 LAB — LIPASE, BLOOD: Lipase: 41 U/L (ref 11–51)

## 2017-08-20 MED ORDER — IOPAMIDOL (ISOVUE-300) INJECTION 61%
INTRAVENOUS | Status: AC
  Administered 2017-08-20: 100 mL via INTRAVENOUS
  Administered 2017-08-20: 18:00:00
  Filled 2017-08-20: qty 100

## 2017-08-20 MED ORDER — MORPHINE SULFATE (PF) 4 MG/ML IV SOLN
4.0000 mg | Freq: Once | INTRAVENOUS | Status: AC
  Administered 2017-08-20: 4 mg via INTRAVENOUS
  Filled 2017-08-20: qty 1

## 2017-08-20 MED ORDER — SODIUM CHLORIDE 0.9 % IJ SOLN
INTRAMUSCULAR | Status: AC
  Administered 2017-08-20: 17:00:00
  Filled 2017-08-20: qty 50

## 2017-08-20 NOTE — ED Provider Notes (Signed)
[facility] DEPT Provider Note   CSN: [id number] Arrival date & time: 08/20/17  1347     History   Chief Complaint Chief Complaint  Patient presents with  . Abdominal Pain  . Rectal Pain  . Anorexia    HPI Beth [name] is a 62 y.o. female with a history of obesity, hypertension, hyperlipidemia, constipation, lupus, prediabetes who presents the emergency department today for abdominal pain and rectal pain.  Patient states that 2 weeks she has been having mild left upper quadrant and epigastric cramping.  She states that this happened after she started a new medication, Xanax.  She was seen by her PCP who reportedly did multiple blood tests as well as a H. pylori test that were negative.  She was started on omeprazole but has not been taking this.  Patient states since that time her pain has become more generalized and she notes it is a pressure sensation as if she has to defecate.  The pressure is constant.  She reports that her pain is sometimes worsened after eating and feels a gurgling sensation in her stomach.  She reports that it is also worse when she tries to have a bowel movement but cannot.  She notes she has been dealing with constipation for some time only having 1-2 bowel movements per week that are hard.  She notes over the last 2 years she has been dealing with rectal pain that she describes as a tearing sensation with bowel movements that relieves after approximately 5-10 minutes.  She denies any gross hematochezia.  She notes some blood on toilet paper after wiping several weeks ago.  She reports over the last 1 month she has been having some melanotic stools.  She denies any iron or Pepto-Bismol use.  She denies any chronic NSAID use, alcohol use, history of gastritis or gastric ulcers.  Patient had a colonoscopy approximately 3 years ago that she states was negative.  Patient reports that she was seen at [facility] yesterday where she had  a x-ray done showed constipation with a Fleet enema with some relief but states she still feels constipated.  Patient reports that she has had a abdominal hysterectomy and C-section in the past.  No other abdominal surgeries.  The patient denies any fever, chills, chest pain, URI symptoms, burping, belching, nausea/vomiting, diarrhea, urinary symptoms, vaginal bleeding, vaginal discharge.   HPI  Past Medical History:  Diagnosis Date  . Arthritis   . Cystocele   . Depression   . Hyperlipidemia   . Hypertension    Labile  . Lupus ([place])   . Prediabetes   . Seizure ([place])   . Vitamin D deficiency     Patient Active Problem List   Diagnosis Date Noted  . OSA (obstructive sleep apnea) 01/24/2015  . RLS (restless legs syndrome) 01/24/2015  . Obesity 05/28/2014  . Stress incontinence 05/28/2014  . Medication management 05/28/2014  . Hypertension   . Prediabetes   . Hyperlipidemia   . Vitamin D deficiency   . 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 Environmental allergies 07/14/2006  . Constipation 07/14/2006  . SLE 07/14/2006    Past Surgical History:  Procedure Laterality Date  . ABDOMINAL HYSTERECTOMY    . CESAREAN SECTION       OB History   None      Home Medications    Prior to Admission medications   Medication Sig Start Date End Date Taking? Authorizing Provider  ALPRAZolam Duanne Moron)  0.5 MG tablet TAKE 1/2 TO 1 TABLET AS NEEDED, TRY NOT TO TAKE DAILY, SHOULD LAST MORE THAN 1 MONTH 08/17/17   Vicie Mutters, PA-C  buPROPion (WELLBUTRIN XL) 150 MG 24 hr tablet Take 1 tablet (150 mg total) by mouth every morning. 08/10/17 08/10/18  Vicie Mutters, PA-C  cefUROXime (CEFTIN) 250 MG tablet Take 1 tablet (250 mg total) by mouth 2 (two) times daily. 08/12/17   Vicie Mutters, PA-C  Cholecalciferol (VITAMIN D) 2000 UNITS CAPS Take 1 capsule by mouth daily.    [provider]  escitalopram (LEXAPRO) 20 MG tablet Take 1 tablet (20 mg total) by mouth daily.  08/01/17 08/01/18  Vicie Mutters, PA-C  hydroxychloroquine (PLAQUENIL) 200 MG tablet Take 2 tablets (400 mg total) daily by mouth. 03/23/17   [name], MD  Magnesium 250 MG TABS Take 250 mg by mouth daily.    [provider]  meloxicam (MOBIC) 15 MG tablet TAKE 1 TAB DAILY W/FOOD FOR 2 WEEKS, THEN AS NEEDED, CAN TAKE W/TYLENOL, DON'T TAKE W/ALEVE OR IBU 04/20/17   Vicie Mutters, PA-C  omeprazole (PRILOSEC) 40 MG capsule Take 1 capsule (40 mg total) by mouth daily. 08/10/17 08/10/18  Vicie Mutters, PA-C    Family History Family History  Problem Relation Age of Onset  . Hyperlipidemia Mother   . Hypertension Mother   . Heart disease Mother     Social History Social History   Tobacco Use  . Smoking status: Never Smoker  . Smokeless tobacco: Never Used  Substance Use Topics  . Alcohol use: No  . Drug use: No     Allergies   Bactrim [sulfamethoxazole-trimethoprim]; Cymbalta [duloxetine hcl]; and Acyclovir and related   Review of Systems Review of Systems  All other systems reviewed and are negative.    Physical Exam Updated Vital Signs BP (!) 154/85 (BP Location: Left Arm)   Pulse 72   Temp 98.3 F (36.8 C) (Oral)   Resp 20   SpO2 100%   Physical Exam  Constitutional: She appears well-developed and well-nourished.  HENT:  Head: Normocephalic and atraumatic.  Right Ear: External ear normal.  Left Ear: External ear normal.  Nose: Nose normal.  Mouth/Throat: Uvula is midline, oropharynx is clear and moist and mucous membranes are normal. No tonsillar exudate.  Eyes: Pupils are equal, round, and reactive to light. Right eye exhibits no discharge. Left eye exhibits no discharge. No scleral icterus.  Neck: Trachea normal. Neck supple. No spinous process tenderness present. No neck rigidity. Normal range of motion present.  Cardiovascular: Normal rate, regular rhythm and intact distal pulses.  No murmur heard. Pulses:      Radial pulses are 2+ on the  right side, and 2+ on the left side.       Dorsalis pedis pulses are 2+ on the right side, and 2+ on the left side.       Posterior tibial pulses are 2+ on the right side, and 2+ on the left side.  No lower extremity swelling or edema. Calves symmetric in size bilaterally.  Pulmonary/Chest: Effort normal and breath sounds normal. She exhibits no tenderness.  Abdominal: Soft. Bowel sounds are normal. She exhibits no distension, no fluid wave and no ascites. There is no hepatosplenomegaly. There is no tenderness. There is no rigidity, no rebound, no guarding, no CVA tenderness, no tenderness at McBurney's point and negative Murphy's sign. No hernia.  Genitourinary:  Genitourinary Comments: Chaperone was present.  Patient with pain around the rectal area. Perianal sensory  intact.  No external fissure's palpated on exam. External hemorrhoids noted without evidence of thrombosis. No induration of the skin or swelling. Digital Rectal Exam reveals sphincter with good tone. No masses palpated. Small hard stool disimpacted. Stool color is brown with no overt blood or melena.  Musculoskeletal: She exhibits no edema.  Lymphadenopathy:    She has no cervical adenopathy.  Neurological: She is alert.  Skin: Skin is warm and dry. No rash noted. She is not diaphoretic.  Psychiatric: She has a normal mood and affect.  Nursing note and vitals reviewed.    ED Treatments / Results  Labs (all labs ordered are listed, but only abnormal results are displayed) Labs Reviewed  COMPREHENSIVE METABOLIC PANEL - Abnormal; Notable for the following components:      Result Value   Glucose, Bld 100 (*)    All other components within normal limits  URINALYSIS, ROUTINE W REFLEX MICROSCOPIC - Abnormal; Notable for the following components:   APPearance CLOUDY (*)    Leukocytes, UA SMALL (*)    All other components within normal limits  URINALYSIS, MICROSCOPIC (REFLEX) - Abnormal; Notable for the following components:    Bacteria, UA RARE (*)    Squamous Epithelial / LPF 0-5 (*)    All other components within normal limits  POC OCCULT BLOOD, ED - Abnormal; Notable for the following components:   Fecal Occult Bld POSITIVE (*)    All other components within normal limits  LIPASE, BLOOD  CBC  OCCULT BLOOD X 1 CARD TO LAB, STOOL    EKG None  Radiology Ct Abdomen Pelvis W Contrast  Result Date: 08/20/2017 CLINICAL DATA:  And in the the everything EXAM: CT ABDOMEN AND PELVIS WITH CONTRAST TECHNIQUE: Multidetector CT imaging of the abdomen and pelvis was performed using the standard protocol following bolus administration of intravenous contrast. CONTRAST:  100 cc ISOVUE-300 IOPAMIDOL (ISOVUE-300) INJECTION 61% COMPARISON:  Scanned sheets of film from an abdominal CT performed at [facility] [place] dated 01/25/2000. FINDINGS: Lower chest: Unremarkable. Hepatobiliary: 4.9 cm lesion posterior right liver dome stable since prior study with peripheral nodular enhancement compatible with hemangioma. Just cranial and more posterior to this dominant 4.9 cm lesion is a 2.1 cm lesion with irregular rim enhancement. This is in the same location as a larger more diffusely enhancing hemangioma on the prior study and likely represents interval involution. 2 cm hemangioma in the inferior right liver (image 21/series 2) is stable in the interval. 15 mm hemangioma in the dome of the left liver is stable. Small area of low attenuation in the anterior liver, adjacent to the falciform ligament, is in a characteristic location for focal fatty deposition. There is no evidence for gallstones, gallbladder wall thickening, or pericholecystic fluid. No intrahepatic or extrahepatic biliary dilation. Pancreas: No focal mass lesion. No dilatation of the main duct. No intraparenchymal cyst. No peripancreatic edema. Spleen: No splenomegaly. No focal mass lesion. Urinary tract: No adrenal nodule or mass. 9 mm cyst identified posterior interpolar right  kidney. 5.2 x 5.4 x 5.0 cm heterogeneously enhancing mass in the lower pole of the left kidney is compatible with renal cell carcinoma. This extends centrally into the central sinus fat of the lower kidney. No filling defect in the left renal vein. No evidence for hydroureter. The urinary bladder appears normal for the degree of distention. Stomach/Bowel: Small hiatal hernia. Stomach is nondistended. No gastric wall thickening. No evidence of outlet obstruction. Duodenum is normally positioned as is the ligament of Treitz. No  small bowel wall thickening. No small bowel dilatation. The terminal ileum is normal. The appendix is normal. No gross colonic mass. No colonic wall thickening. No substantial diverticular change. Vascular/Lymphatic: There is abdominal aortic atherosclerosis without aneurysm. There is no gastrohepatic or hepatoduodenal ligament lymphadenopathy. No intraperitoneal or retroperitoneal lymphadenopathy. No pelvic sidewall lymphadenopathy. Surgical clips are seen along the bilateral pelvic sidewalls, suggesting prior lymph node dissection. Reproductive: Uterus surgically absent.  There is no adnexal mass. Other: No intraperitoneal free fluid. Musculoskeletal: Bone windows reveal no worrisome lytic or sclerotic osseous lesions. IMPRESSION: 1. 5.4 cm heterogeneously enhancing mass in the inferior left kidney, consistent with renal cell carcinoma. No evidence for involvement of the left renal vein. No lymphadenopathy in the abdomen or pelvis to suggest metastatic disease. 2. 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 Comparing to scanned film from a CT of 01/25/2000, these are stable in the interval. A fourth lesion in the high posterior right hepatic dome shows irregular circumferential enhancement and although not consistent with hemangioma on this study, there was a larger lesion at this location on the remote exam suggesting interval involution. Nevertheless, attention to this  region is recommended on follow-up imaging for the patient's left-sided renal mass. 3. Aortic Atherosclerois (ICD10-170.0) Electronically Signed   By: Misty Stanley M.D.   On: 08/20/2017 17:41    Procedures Procedures (including critical care time)  Medications Ordered in ED Medications  iopamidol (ISOVUE-300) 61 % injection (  Contrast Given 08/20/17 1746)  sodium chloride 0.9 % injection (  Given by Other 08/20/17 1729)  morphine 4 MG/ML injection 4 mg (4 mg Intravenous Given 08/20/17 1725)     Initial Impression / Assessment and Plan / ED Course  I have reviewed the triage vital signs and the nursing notes.  Pertinent labs & imaging results that were available during my care of the patient were reviewed by me and considered in my medical decision making (see chart for details).     62 year old female presenting with 2-week history of abdominal pain that began as epigastric and has now become more generalized.  She reports associated nausea, anorexia and constipation.  She was seen at [facility] yesterday where she had a screening abdominal x-ray done that showed constipation and was given a Fleet enema with mild relief.  She reports that her PCP is done multiple rounds of blood work that have come back reassuring.  She has also had a H. pylori test that was negative.  She denies any chronic NSAID use, alcohol use or history of gastritis/gastric ulcers.  She notes she has had a colonoscopy approximately 3 years ago that was negative.  She notes she has had a hysterectomy and C-section in the past but denies any other abdominal surgeries.  She denies any fever, urinary symptoms or pelvic pain at this time.  Patient vital signs are reassuring.  No fever, tachycardia, tachypnea, hypoxia or hypotension.  Patient is non-ill and nonseptic appearing.  Patient with soft, non-peritoneal abdomen.  She notes generalized tenderness palpation.   Patient with reports also of melena.  Patient noted on  rectal exam to have hemorrhoids without evidence of thrombosis.  No pain around rectal area.  No external fissures palpated.  Fecal occult blood test positive.  Given duration of symptoms as well as positive occult blood card will obtain CT scan to evaluate.   Patient CT scan shows new 5.4 cm heterogeneously enhancing mass in the inferior left kidney, consistent with renal cell carcinoma.  There is no evidence of involvement of the left renal vein.  No lymphadenopathy in the abdomen or pelvis suggest metastases.  Patient's kidney function within normal limits.  Normal white count.  I consulted Dr. Alvy Bimler of oncology who recommended no biopsy at this time.  She recommends consult to urology for further workup.   I spoke with Dr. Jonnie Finner of nephrology who recommended the patient follow-up in the office tomorrow at 8 AM.  I discussed the findings of patient's CT scan with her.  She states understanding.  Patient made aware of right hepatic hemangioma as well.  She is to follow with PCP for this.  Without anemia require workup further for GI bleed.  Recommended continuing PPI as previously prescribed by primary care provider. Patient's symptoms are currently controlled.  The evaluation does not show pathology that would require ongoing emergent intervention or inpatient treatment. I advised the patient to return to the emergency department with new or worsening symptoms or new concerns. Specific return precautions discussed. The patient verbalized understanding and agreement with plan. All questions answered. No further questions at this time. The patient is hemodynamically stable, mentating appropriately and appears safe for discharge.  Patient case discussed with Dr. Regenia Skeeter who is in agreement with plan.  Final Clinical Impressions(s) / ED Diagnoses   Final diagnoses:  Renal cell carcinoma of left kidney [facility])    ED Discharge Orders    None       Lorelle Gibbs 08/20/17 2204      Sherwood Gambler, MD 08/20/17 2329

## 2017-08-20 NOTE — ED Triage Notes (Signed)
Patient here from home with complaints of abdominal pain, loss of appetite and rectal pain. Nausea no vomiting. Seen by primary doctor who did multiple test. All negative, referred to GI specialist, scheduled for CT on Tuesday.

## 2017-08-20 NOTE — Consult Note (Signed)
Imaging reviewed. Left 5.4cm renal mass concerning for renal cell carcinoma. No acute intervention necessary for this in absence of active acute blood loss. The patient may come to my office Monday morning at 8am for a new patient visit to discuss option of nephrectomy.

## 2017-08-20 NOTE — Discharge Instructions (Addendum)
You were seen here today for abdominal pain. As we discussed your CT scan shows evidence of renal cell carcinoma. I have spoke with nephrology who would like you to follow up with them in their office tomorrow at 8am. It is very improtant that you go to your appointment. You can return for any concerns.   Your CT scan shows  1. 5.4 cm heterogeneously enhancing mass in the inferior left kidney, consistent with renal cell carcinoma. No evidence for involvement of the left renal vein. No lymphadenopathy in the abdomen or pelvis to suggest metastatic disease. 2. 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 Comparing to scanned film from a CT of 01/25/2000, these are stable in the interval. A fourth lesion in the high posterior right hepatic dome shows irregular circumferential enhancement and although not consistent with hemangioma on this study, there was a larger lesion at this location on the remote exam suggesting interval involution. Nevertheless, attention to this region is recommended on follow-up imaging for the patient's left-sided renal mass.

## 2017-08-23 ENCOUNTER — Encounter: Payer: Self-pay | Admitting: Physician Assistant

## 2017-08-23 ENCOUNTER — Other Ambulatory Visit: Payer: Self-pay | Admitting: Urology

## 2017-08-29 ENCOUNTER — Ambulatory Visit (HOSPITAL_COMMUNITY)
Admission: RE | Admit: 2017-08-29 | Discharge: 2017-08-29 | Disposition: A | Discharge status: Home / Self Care | Payer: BC Managed Care – PPO | Source: Ambulatory Visit | Attending: Urology | Admitting: Urology

## 2017-08-29 ENCOUNTER — Other Ambulatory Visit: Payer: Self-pay | Admitting: Urology

## 2017-08-29 DIAGNOSIS — D49512 Neoplasm of unspecified behavior of left kidney: Secondary | ICD-10-CM

## 2017-09-12 NOTE — Progress Notes (Signed)
12-23-16 (Epic) EKG  08-19-17 (Epic) CXR

## 2017-09-12 NOTE — Patient Instructions (Signed)
Beth [name]  09/12/2017   Your procedure is scheduled on: 09-19-17   Report to [facility] Main  Entrance     Report to admitting at 5:30 AM    Call this number if you have problems the morning of surgery [phone]   Remember: Do not eat food or drink liquids :After Midnight.     Take these medicines the morning of surgery with A SIP OF WATER: Escitalopram (Lexapro)                               You may not have any metal on your body including hair pins and              piercings  Do not wear jewelry, make-up, lotions, powders or perfumes, deodorant             Do not wear nail polish.  Do not shave  48 hours prior to surgery.                Do not bring valuables to the hospital. [place].  Contacts, dentures or bridgework may not be worn into surgery.  Leave suitcase in the car. After surgery it may be brought to your room.                 Please read over the following fact sheets you were given: _____________________________________________________________________         [facility] - Preparing for Surgery Before surgery, you can play an important role.  Because skin is not sterile, your skin needs to be as free of germs as possible.  You can reduce the number of germs on your skin by washing with CHG (chlorahexidine gluconate) soap before surgery.  CHG is an antiseptic cleaner which kills germs and bonds with the skin to continue killing germs even after washing. Please DO NOT use if you have an allergy to CHG or antibacterial soaps.  If your skin becomes reddened/irritated stop using the CHG and inform your nurse when you arrive at Short Stay. Do not shave (including legs and underarms) for at least 48 hours prior to the first CHG shower.  You may shave your face/neck. Please follow these instructions carefully:  1.  Shower with CHG Soap the night before surgery and the  morning of Surgery.  2.   If you choose to wash your hair, wash your hair first as usual with your  normal  shampoo.  3.  After you shampoo, rinse your hair and body thoroughly to remove the  shampoo.                           4.  Use CHG as you would any other liquid soap.  You can apply chg directly  to the skin and wash                       Gently with a scrungie or clean washcloth.  5.  Apply the CHG Soap to your body ONLY FROM THE NECK DOWN.   Do not use on face/ open  Wound or open sores. Avoid contact with eyes, ears mouth and genitals (private parts).                       Wash face,  Genitals (private parts) with your normal soap.             6.  Wash thoroughly, paying special attention to the area where your surgery  will be performed.  7.  Thoroughly rinse your body with warm water from the neck down.  8.  DO NOT shower/wash with your normal soap after using and rinsing off  the CHG Soap.                9.  Pat yourself dry with a clean towel.            10.  Wear clean pajamas.            11.  Place clean sheets on your bed the night of your first shower and do not  sleep with pets. Day of Surgery : Do not apply any lotions/deodorants the morning of surgery.  Please wear clean clothes to the hospital/surgery center.  FAILURE TO FOLLOW THESE INSTRUCTIONS MAY RESULT IN THE CANCELLATION OF YOUR SURGERY PATIENT SIGNATURE_________________________________  NURSE SIGNATURE__________________________________  ________________________________________________________________________  WHAT IS A BLOOD TRANSFUSION? Blood Transfusion Information  A transfusion is the replacement of blood or some of its parts. Blood is made up of multiple cells which provide different functions.  Red blood cells carry oxygen and are used for blood loss replacement.  White blood cells fight against infection.  Platelets control bleeding.  Plasma helps clot blood.  Other blood products are available for  specialized needs, such as hemophilia or other clotting disorders. BEFORE THE TRANSFUSION  Who gives blood for transfusions?   Healthy volunteers who are fully evaluated to make sure their blood is safe. This is blood bank blood. Transfusion therapy is the safest it has ever been in the practice of medicine. Before blood is taken from a donor, a complete history is taken to make sure that person has no history of diseases nor engages in risky social behavior (examples are intravenous drug use or sexual activity with multiple partners). The donor's travel history is screened to minimize risk of transmitting infections, such as malaria. The donated blood is tested for signs of infectious diseases, such as HIV and hepatitis. The blood is then tested to be sure it is compatible with you in order to minimize the chance of a transfusion reaction. If you or a relative donates blood, this is often done in anticipation of surgery and is not appropriate for emergency situations. It takes many days to process the donated blood. RISKS AND COMPLICATIONS Although transfusion therapy is very safe and saves many lives, the main dangers of transfusion include:   Getting an infectious disease.  Developing a transfusion reaction. This is an allergic reaction to something in the blood you were given. Every precaution is taken to prevent this. The decision to have a blood transfusion has been considered carefully by your caregiver before blood is given. Blood is not given unless the benefits outweigh the risks. AFTER THE TRANSFUSION  Right after receiving a blood transfusion, you will usually feel much better and more energetic. This is especially true if your red blood cells have gotten low (anemic). The transfusion raises the level of the red blood cells which carry oxygen, and this usually causes an energy increase.  The  nurse administering the transfusion will monitor you carefully for complications. HOME CARE  INSTRUCTIONS  No special instructions are needed after a transfusion. You may find your energy is better. Speak with your caregiver about any limitations on activity for underlying diseases you may have. SEEK MEDICAL CARE IF:   Your condition is not improving after your transfusion.  You develop redness or irritation at the intravenous (IV) site. SEEK IMMEDIATE MEDICAL CARE IF:  Any of the following symptoms occur over the next 12 hours:  Shaking chills.  You have a temperature by mouth above 102 F (38.9 C), not controlled by medicine.  Chest, back, or muscle pain.  People around you feel you are not acting correctly or are confused.  Shortness of breath or difficulty breathing.  Dizziness and fainting.  You get a rash or develop hives.  You have a decrease in urine output.  Your urine turns a dark color or changes to pink, red, or brown. Any of the following symptoms occur over the next 10 days:  You have a temperature by mouth above 102 F (38.9 C), not controlled by medicine.  Shortness of breath.  Weakness after normal activity.  The white part of the eye turns yellow (jaundice).  You have a decrease in the amount of urine or are urinating less often.  Your urine turns a dark color or changes to pink, red, or brown. Document Released: 04/30/2000 Document Revised: 07/26/2011 Document Reviewed: 12/18/2007 [facility] Patient Information 2014 [place], [place].  _______________________________________________________________________

## 2017-09-13 ENCOUNTER — Encounter (HOSPITAL_COMMUNITY): Payer: Self-pay

## 2017-09-13 ENCOUNTER — Other Ambulatory Visit: Payer: Self-pay

## 2017-09-13 ENCOUNTER — Encounter (HOSPITAL_COMMUNITY)
Admission: RE | Admit: 2017-09-13 | Discharge: 2017-09-13 | Disposition: A | Discharge status: Home / Self Care | Payer: BC Managed Care – PPO | Source: Ambulatory Visit | Attending: Urology | Admitting: Urology

## 2017-09-13 DIAGNOSIS — N289 Disorder of kidney and ureter, unspecified: Secondary | ICD-10-CM | POA: Insufficient documentation

## 2017-09-13 DIAGNOSIS — Z01812 Encounter for preprocedural laboratory examination: Secondary | ICD-10-CM | POA: Diagnosis present

## 2017-09-13 HISTORY — DX: Sleep apnea, unspecified: G47.30

## 2017-09-13 HISTORY — DX: Prediabetes: R73.03

## 2017-09-13 HISTORY — DX: Anxiety disorder, unspecified: F41.9

## 2017-09-13 LAB — CBC
HCT: 39.4 % (ref 36.0–46.0)
Hemoglobin: 12.9 g/dL (ref 12.0–15.0)
MCH: 30.4 pg (ref 26.0–34.0)
MCHC: 32.7 g/dL (ref 30.0–36.0)
MCV: 92.7 fL (ref 78.0–100.0)
Platelets: 258 10*3/uL (ref 150–400)
RBC: 4.25 MIL/uL (ref 3.87–5.11)
RDW: 14 % (ref 11.5–15.5)
WBC: 3.9 10*3/uL — ABNORMAL LOW (ref 4.0–10.5)

## 2017-09-13 LAB — BASIC METABOLIC PANEL
Anion gap: 10 (ref 5–15)
BUN: 14 mg/dL (ref 6–20)
CO2: 24 mmol/L (ref 22–32)
Calcium: 9.1 mg/dL (ref 8.9–10.3)
Chloride: 110 mmol/L (ref 101–111)
Creatinine, Ser: 0.89 mg/dL (ref 0.44–1.00)
GFR calc Af Amer: >60 mL/min (ref >60)
GFR calc non Af Amer: >60 mL/min (ref >60)
Glucose, Bld: 105 mg/dL — ABNORMAL HIGH (ref 65–99)
Potassium: 4.1 mmol/L (ref 3.5–5.1)
Sodium: 144 mmol/L (ref 135–145)

## 2017-09-13 LAB — HEMOGLOBIN A1C
Hgb A1c MFr Bld: 5.8 % — ABNORMAL HIGH (ref 4.8–5.6)
Mean Plasma Glucose: 119.76 mg/dL

## 2017-09-13 LAB — PROTIME-INR
INR: 0.92
Prothrombin Time: 12.3 seconds (ref 11.4–15.2)

## 2017-09-14 LAB — ABO/RH: ABO/RH(D): B POS

## 2017-09-18 NOTE — Anesthesia Preprocedure Evaluation (Addendum)
Anesthesia Evaluation  Patient identified by MRN, date of birth, ID band Patient awake    Reviewed: Allergy & Precautions, H&P , NPO status , Patient's Chart, lab work & pertinent test results, reviewed documented beta blocker date and time   Airway Mallampati: II  TM Distance: >3 FB Neck ROM: full    Dental no notable dental hx.    Pulmonary sleep apnea ,    Pulmonary exam normal breath sounds clear to auscultation       Cardiovascular Exercise Tolerance: Good hypertension, negative cardio ROS Normal cardiovascular exam Rhythm:regular Rate:Normal     Neuro/Psych Seizures -,  Anxiety Depression    GI/Hepatic Neg liver ROS, GERD  ,  Endo/Other  negative endocrine ROS  Renal/GU negative Renal ROS  negative genitourinary   Musculoskeletal  (+) Arthritis , Osteoarthritis,    Abdominal   Peds  Hematology negative hematology ROS (+)   Anesthesia Other Findings Depression, major, in remission Constipation SLE Hypertension Prediabetes Hyperlipidemia Obesity OSA RLS     Reproductive/Obstetrics negative OB ROS                             Anesthesia Physical Anesthesia Plan  ASA: III  Anesthesia Plan: General   Post-op Pain Management:    Induction: Intravenous  PONV Risk Score and Plan: 3 and Ondansetron, Dexamethasone, Treatment may vary due to age or medical condition and Midazolam  Airway Management Planned: Oral ETT  Additional Equipment:   Intra-op Plan:   Post-operative Plan: Extubation in OR  Informed Consent: I have reviewed the patients History and Physical, chart, labs and discussed the procedure including the risks, benefits and alternatives for the proposed anesthesia with the patient or authorized representative who has indicated his/her understanding and acceptance.   Dental Advisory Given  Plan Discussed with: CRNA, Anesthesiologist and Surgeon  Anesthesia  Plan Comments: (  )        Anesthesia Quick Evaluation

## 2017-09-19 ENCOUNTER — Inpatient Hospital Stay (HOSPITAL_COMMUNITY): Payer: BC Managed Care – PPO | Admitting: Anesthesiology

## 2017-09-19 ENCOUNTER — Encounter (HOSPITAL_COMMUNITY): Payer: Self-pay | Admitting: Certified Registered"

## 2017-09-19 ENCOUNTER — Inpatient Hospital Stay (HOSPITAL_COMMUNITY)
Admission: RE | Admit: 2017-09-19 | Discharge: 2017-09-21 | DRG: 658 | Disposition: A | Discharge status: Home / Self Care | Payer: BC Managed Care – PPO | Source: Ambulatory Visit | Attending: Urology | Admitting: Urology

## 2017-09-19 ENCOUNTER — Encounter (HOSPITAL_COMMUNITY)
Admission: RE | Disposition: A | Discharge status: Home / Self Care | Payer: Self-pay | Source: Ambulatory Visit | Attending: Urology

## 2017-09-19 ENCOUNTER — Other Ambulatory Visit: Payer: Self-pay

## 2017-09-19 DIAGNOSIS — C642 Malignant neoplasm of left kidney, except renal pelvis: Principal | ICD-10-CM | POA: Diagnosis present

## 2017-09-19 DIAGNOSIS — K219 Gastro-esophageal reflux disease without esophagitis: Secondary | ICD-10-CM | POA: Diagnosis present

## 2017-09-19 DIAGNOSIS — F329 Major depressive disorder, single episode, unspecified: Secondary | ICD-10-CM | POA: Diagnosis present

## 2017-09-19 DIAGNOSIS — I10 Essential (primary) hypertension: Secondary | ICD-10-CM | POA: Diagnosis present

## 2017-09-19 DIAGNOSIS — Z90710 Acquired absence of both cervix and uterus: Secondary | ICD-10-CM | POA: Diagnosis not present

## 2017-09-19 DIAGNOSIS — M329 Systemic lupus erythematosus, unspecified: Secondary | ICD-10-CM | POA: Diagnosis present

## 2017-09-19 DIAGNOSIS — Z6833 Body mass index (BMI) 33.0-33.9, adult: Secondary | ICD-10-CM | POA: Diagnosis not present

## 2017-09-19 DIAGNOSIS — G2581 Restless legs syndrome: Secondary | ICD-10-CM | POA: Diagnosis present

## 2017-09-19 DIAGNOSIS — R7303 Prediabetes: Secondary | ICD-10-CM | POA: Diagnosis present

## 2017-09-19 DIAGNOSIS — E785 Hyperlipidemia, unspecified: Secondary | ICD-10-CM | POA: Diagnosis present

## 2017-09-19 DIAGNOSIS — D1803 Hemangioma of intra-abdominal structures: Secondary | ICD-10-CM | POA: Diagnosis present

## 2017-09-19 DIAGNOSIS — G4733 Obstructive sleep apnea (adult) (pediatric): Secondary | ICD-10-CM | POA: Diagnosis present

## 2017-09-19 DIAGNOSIS — Z882 Allergy status to sulfonamides status: Secondary | ICD-10-CM

## 2017-09-19 DIAGNOSIS — N2889 Other specified disorders of kidney and ureter: Secondary | ICD-10-CM | POA: Diagnosis present

## 2017-09-19 DIAGNOSIS — E669 Obesity, unspecified: Secondary | ICD-10-CM | POA: Diagnosis present

## 2017-09-19 DIAGNOSIS — Z888 Allergy status to other drugs, medicaments and biological substances status: Secondary | ICD-10-CM | POA: Diagnosis not present

## 2017-09-19 DIAGNOSIS — Z79899 Other long term (current) drug therapy: Secondary | ICD-10-CM | POA: Diagnosis not present

## 2017-09-19 DIAGNOSIS — F419 Anxiety disorder, unspecified: Secondary | ICD-10-CM | POA: Diagnosis present

## 2017-09-19 HISTORY — PX: LAPAROSCOPIC NEPHRECTOMY, HAND ASSISTED: SHX1929

## 2017-09-19 LAB — BASIC METABOLIC PANEL
Anion gap: 8 (ref 5–15)
BUN: 13 mg/dL (ref 6–20)
CO2: 23 mmol/L (ref 22–32)
Calcium: 8.6 mg/dL — ABNORMAL LOW (ref 8.9–10.3)
Chloride: 110 mmol/L (ref 101–111)
Creatinine, Ser: 0.78 mg/dL (ref 0.44–1.00)
GFR calc Af Amer: >60 mL/min (ref >60)
GFR calc non Af Amer: >60 mL/min (ref >60)
Glucose, Bld: 137 mg/dL — ABNORMAL HIGH (ref 65–99)
Potassium: 4.1 mmol/L (ref 3.5–5.1)
Sodium: 141 mmol/L (ref 135–145)

## 2017-09-19 LAB — TYPE AND SCREEN
ABO/RH(D): B POS
Antibody Screen: NEGATIVE

## 2017-09-19 LAB — HEMOGLOBIN AND HEMATOCRIT, BLOOD
HCT: 39.5 % (ref 36.0–46.0)
Hemoglobin: 12.7 g/dL (ref 12.0–15.0)

## 2017-09-19 LAB — GLUCOSE, CAPILLARY: Glucose-Capillary: 106 mg/dL — ABNORMAL HIGH (ref 65–99)

## 2017-09-19 SURGERY — NEPHRECTOMY, HAND-ASSISTED, LAPAROSCOPIC
Anesthesia: General | Laterality: Left

## 2017-09-19 MED ORDER — FENTANYL CITRATE (PF) 250 MCG/5ML IJ SOLN
INTRAMUSCULAR | Status: AC
  Filled 2017-09-19: qty 5

## 2017-09-19 MED ORDER — HYDROCODONE-ACETAMINOPHEN 5-325 MG PO TABS: 1.0000 | ORAL_TABLET | ORAL | 0 refills | Status: AC | PRN

## 2017-09-19 MED ORDER — SODIUM CHLORIDE 0.9 % IV SOLN
INTRAVENOUS | Status: DC
  Administered 2017-09-19: 11:00:00 via INTRAVENOUS

## 2017-09-19 MED ORDER — ESCITALOPRAM OXALATE 20 MG PO TABS
20.0000 mg | ORAL_TABLET | Freq: Every day | ORAL | Status: DC
  Administered 2017-09-20 – 2017-09-21 (×2): 20 mg via ORAL
  Filled 2017-09-19 (×2): qty 1

## 2017-09-19 MED ORDER — OXYCODONE HCL 5 MG/5ML PO SOLN
5.0000 mg | Freq: Once | ORAL | Status: DC | PRN
  Filled 2017-09-19: qty 5

## 2017-09-19 MED ORDER — METOPROLOL TARTRATE 5 MG/5ML IV SOLN
INTRAVENOUS | Status: DC | PRN
  Administered 2017-09-19 (×2): 1 mg via INTRAVENOUS

## 2017-09-19 MED ORDER — PROPOFOL 10 MG/ML IV BOLUS
INTRAVENOUS | Status: AC
  Filled 2017-09-19: qty 20

## 2017-09-19 MED ORDER — FENTANYL CITRATE (PF) 100 MCG/2ML IJ SOLN
INTRAMUSCULAR | Status: DC | PRN
  Administered 2017-09-19: 100 ug via INTRAVENOUS
  Administered 2017-09-19 (×3): 50 ug via INTRAVENOUS

## 2017-09-19 MED ORDER — ROCURONIUM BROMIDE 10 MG/ML (PF) SYRINGE
PREFILLED_SYRINGE | INTRAVENOUS | Status: AC
  Filled 2017-09-19: qty 5

## 2017-09-19 MED ORDER — SUGAMMADEX SODIUM 200 MG/2ML IV SOLN
INTRAVENOUS | Status: DC | PRN
  Administered 2017-09-19: 200 mg via INTRAVENOUS

## 2017-09-19 MED ORDER — PHENYLEPHRINE 40 MCG/ML (10ML) SYRINGE FOR IV PUSH (FOR BLOOD PRESSURE SUPPORT)
PREFILLED_SYRINGE | INTRAVENOUS | Status: AC
  Filled 2017-09-19: qty 10

## 2017-09-19 MED ORDER — PROPOFOL 10 MG/ML IV BOLUS
INTRAVENOUS | Status: DC | PRN
  Administered 2017-09-19: 150 mg via INTRAVENOUS

## 2017-09-19 MED ORDER — ONDANSETRON HCL 4 MG/2ML IJ SOLN: 4.0000 mg | Freq: Once | INTRAMUSCULAR | Status: DC | PRN

## 2017-09-19 MED ORDER — CEFAZOLIN SODIUM-DEXTROSE 2-4 GM/100ML-% IV SOLN
INTRAVENOUS | Status: AC
  Filled 2017-09-19: qty 100

## 2017-09-19 MED ORDER — SUGAMMADEX SODIUM 200 MG/2ML IV SOLN
INTRAVENOUS | Status: AC
  Filled 2017-09-19: qty 2

## 2017-09-19 MED ORDER — ROCURONIUM BROMIDE 100 MG/10ML IV SOLN
INTRAVENOUS | Status: DC | PRN
  Administered 2017-09-19: 50 mg via INTRAVENOUS
  Administered 2017-09-19 (×2): 20 mg via INTRAVENOUS

## 2017-09-19 MED ORDER — FENTANYL CITRATE (PF) 100 MCG/2ML IJ SOLN
INTRAMUSCULAR | Status: AC
  Administered 2017-09-19: 11:00:00
  Filled 2017-09-19: qty 2

## 2017-09-19 MED ORDER — EPHEDRINE 5 MG/ML INJ
INTRAVENOUS | Status: AC
  Filled 2017-09-19: qty 10

## 2017-09-19 MED ORDER — MIDAZOLAM HCL 2 MG/2ML IJ SOLN
INTRAMUSCULAR | Status: AC
  Filled 2017-09-19: qty 2

## 2017-09-19 MED ORDER — HYDROMORPHONE HCL 1 MG/ML IJ SOLN
INTRAMUSCULAR | Status: DC | PRN
  Administered 2017-09-19: 1 mg via INTRAVENOUS
  Administered 2017-09-19 (×2): 0.5 mg via INTRAVENOUS

## 2017-09-19 MED ORDER — MORPHINE SULFATE (PF) 4 MG/ML IV SOLN
2.0000 mg | INTRAVENOUS | Status: DC | PRN
  Administered 2017-09-19 (×3): 2 mg via INTRAVENOUS
  Filled 2017-09-19 (×3): qty 1

## 2017-09-19 MED ORDER — ESMOLOL HCL 100 MG/10ML IV SOLN
INTRAVENOUS | Status: DC | PRN
  Administered 2017-09-19 (×2): 20 mg via INTRAVENOUS

## 2017-09-19 MED ORDER — ACETAMINOPHEN 160 MG/5ML PO SOLN: 325.0000 mg | ORAL | Status: DC | PRN

## 2017-09-19 MED ORDER — SODIUM CHLORIDE 0.9 % IJ SOLN
INTRAMUSCULAR | Status: AC
  Filled 2017-09-19: qty 50

## 2017-09-19 MED ORDER — ACETAMINOPHEN 325 MG PO TABS: 325.0000 mg | ORAL_TABLET | ORAL | Status: DC | PRN

## 2017-09-19 MED ORDER — ACETAMINOPHEN 325 MG PO TABS
650.0000 mg | ORAL_TABLET | ORAL | Status: DC | PRN
  Administered 2017-09-20 – 2017-09-21 (×4): 650 mg via ORAL
  Filled 2017-09-19 (×4): qty 2

## 2017-09-19 MED ORDER — LIDOCAINE 2% (20 MG/ML) 5 ML SYRINGE
INTRAMUSCULAR | Status: AC
  Filled 2017-09-19: qty 5

## 2017-09-19 MED ORDER — EPHEDRINE SULFATE-NACL 50-0.9 MG/10ML-% IV SOSY
PREFILLED_SYRINGE | INTRAVENOUS | Status: DC | PRN
  Administered 2017-09-19: 5 mg via INTRAVENOUS
  Administered 2017-09-19: 10 mg via INTRAVENOUS

## 2017-09-19 MED ORDER — DEXAMETHASONE SODIUM PHOSPHATE 10 MG/ML IJ SOLN
INTRAMUSCULAR | Status: AC
  Filled 2017-09-19: qty 1

## 2017-09-19 MED ORDER — ONDANSETRON HCL 4 MG/2ML IJ SOLN
4.0000 mg | INTRAMUSCULAR | Status: DC | PRN
  Administered 2017-09-20: 4 mg via INTRAVENOUS
  Filled 2017-09-19: qty 2

## 2017-09-19 MED ORDER — GLYCOPYRROLATE 0.2 MG/ML IV SOSY
PREFILLED_SYRINGE | INTRAVENOUS | Status: AC
  Filled 2017-09-19: qty 3

## 2017-09-19 MED ORDER — OXYCODONE HCL 5 MG PO TABS: 5.0000 mg | ORAL_TABLET | Freq: Once | ORAL | Status: DC | PRN

## 2017-09-19 MED ORDER — ESMOLOL HCL 100 MG/10ML IV SOLN
INTRAVENOUS | Status: AC
  Filled 2017-09-19: qty 10

## 2017-09-19 MED ORDER — HYDROXYCHLOROQUINE SULFATE 200 MG PO TABS
400.0000 mg | ORAL_TABLET | Freq: Every day | ORAL | Status: DC
  Administered 2017-09-20 – 2017-09-21 (×2): 400 mg via ORAL
  Filled 2017-09-19 (×3): qty 2

## 2017-09-19 MED ORDER — LIDOCAINE 2% (20 MG/ML) 5 ML SYRINGE
INTRAMUSCULAR | Status: DC | PRN
  Administered 2017-09-19: 80 mg via INTRAVENOUS

## 2017-09-19 MED ORDER — DOCUSATE SODIUM 100 MG PO CAPS
100.0000 mg | ORAL_CAPSULE | Freq: Two times a day (BID) | ORAL | Status: DC
  Administered 2017-09-19 – 2017-09-21 (×5): 100 mg via ORAL
  Filled 2017-09-19 (×4): qty 1

## 2017-09-19 MED ORDER — HYDROMORPHONE HCL 2 MG/ML IJ SOLN
INTRAMUSCULAR | Status: AC
  Filled 2017-09-19: qty 1

## 2017-09-19 MED ORDER — CEFAZOLIN SODIUM-DEXTROSE 1-4 GM/50ML-% IV SOLN
1.0000 g | Freq: Three times a day (TID) | INTRAVENOUS | Status: AC
  Administered 2017-09-19 (×2): 1 g via INTRAVENOUS
  Filled 2017-09-19 (×3): qty 50

## 2017-09-19 MED ORDER — LACTATED RINGERS IV SOLN
INTRAVENOUS | Status: DC
Start: 2017-09-19 — End: 2017-09-20
  Administered 2017-09-19 (×2): via INTRAVENOUS

## 2017-09-19 MED ORDER — ONDANSETRON HCL 4 MG/2ML IJ SOLN
INTRAMUSCULAR | Status: DC | PRN
  Administered 2017-09-19: 4 mg via INTRAVENOUS

## 2017-09-19 MED ORDER — BUPIVACAINE LIPOSOME 1.3 % IJ SUSP
20.0000 mL | Freq: Once | INTRAMUSCULAR | Status: AC
  Administered 2017-09-19: 266 mg
  Administered 2017-09-19: 20 mL
  Filled 2017-09-19: qty 20

## 2017-09-19 MED ORDER — MEPERIDINE HCL 50 MG/ML IJ SOLN: 6.2500 mg | INTRAMUSCULAR | Status: DC | PRN

## 2017-09-19 MED ORDER — RINGERS IRRIGATION IR SOLN
Status: DC | PRN
  Administered 2017-09-19: 1

## 2017-09-19 MED ORDER — FENTANYL CITRATE (PF) 100 MCG/2ML IJ SOLN
25.0000 ug | INTRAMUSCULAR | Status: DC | PRN
  Administered 2017-09-19 (×2): 25 ug via INTRAVENOUS
  Administered 2017-09-19: 50 ug via INTRAVENOUS

## 2017-09-19 MED ORDER — PHENYLEPHRINE 40 MCG/ML (10ML) SYRINGE FOR IV PUSH (FOR BLOOD PRESSURE SUPPORT)
PREFILLED_SYRINGE | INTRAVENOUS | Status: DC | PRN
  Administered 2017-09-19: 40 ug via INTRAVENOUS

## 2017-09-19 MED ORDER — GLYCOPYRROLATE 0.2 MG/ML IV SOSY
PREFILLED_SYRINGE | INTRAVENOUS | Status: DC | PRN
  Administered 2017-09-19 (×2): .1 mg via INTRAVENOUS

## 2017-09-19 MED ORDER — SODIUM CHLORIDE 0.9% FLUSH
INTRAVENOUS | Status: DC | PRN
  Administered 2017-09-19: 20 mL

## 2017-09-19 MED ORDER — MIDAZOLAM HCL 5 MG/5ML IJ SOLN
INTRAMUSCULAR | Status: DC | PRN
  Administered 2017-09-19: 2 mg via INTRAVENOUS

## 2017-09-19 MED ORDER — ONDANSETRON HCL 4 MG/2ML IJ SOLN
INTRAMUSCULAR | Status: AC
  Filled 2017-09-19: qty 2

## 2017-09-19 MED ORDER — OXYCODONE HCL 5 MG PO TABS
5.0000 mg | ORAL_TABLET | ORAL | Status: DC | PRN
  Administered 2017-09-20 – 2017-09-21 (×6): 5 mg via ORAL
  Filled 2017-09-19 (×6): qty 1

## 2017-09-19 MED ORDER — DEXAMETHASONE SODIUM PHOSPHATE 10 MG/ML IJ SOLN
INTRAMUSCULAR | Status: DC | PRN
  Administered 2017-09-19: 10 mg via INTRAVENOUS

## 2017-09-19 MED ORDER — CEFAZOLIN SODIUM-DEXTROSE 2-4 GM/100ML-% IV SOLN
2.0000 g | INTRAVENOUS | Status: AC
  Administered 2017-09-19: 2 g via INTRAVENOUS

## 2017-09-19 MED ORDER — METOPROLOL TARTRATE 5 MG/5ML IV SOLN
INTRAVENOUS | Status: AC
  Filled 2017-09-19: qty 5

## 2017-09-19 SURGICAL SUPPLY — 36 items
BAG SPEC THK2 15X12 ZIP CLS (MISCELLANEOUS) ×1
BAG ZIPLOCK 12X15 (MISCELLANEOUS) ×2 IMPLANT
CABLE HIGH FREQUENCY MONO STRZ (ELECTRODE) ×2 IMPLANT
CHLORAPREP W/TINT 26ML (MISCELLANEOUS) ×2 IMPLANT
CLIP VESOLOCK LG 6/CT PURPLE (CLIP) ×2 IMPLANT
CLIP VESOLOCK MED LG 6/CT (CLIP) ×2 IMPLANT
COVER SURGICAL LIGHT HANDLE (MISCELLANEOUS) ×2 IMPLANT
CUTTER FLEX LINEAR 45M (STAPLE) ×2 IMPLANT
DRAPE INCISE IOBAN 66X45 STRL (DRAPES) ×2 IMPLANT
DRESSING TELFA ISLAND 4X8 (GAUZE/BANDAGES/DRESSINGS) ×1 IMPLANT
ELECT PENCIL ROCKER SW 15FT (MISCELLANEOUS) ×2 IMPLANT
ELECT REM PT RETURN 15FT ADLT (MISCELLANEOUS) ×2 IMPLANT
GLOVE BIO SURGEON STRL SZ 6.5 (GLOVE) ×2 IMPLANT
GLOVE BIOGEL M STRL SZ7.5 (GLOVE) ×2 IMPLANT
GOWN STRL REUS W/TWL LRG LVL3 (GOWN DISPOSABLE) ×2 IMPLANT
GOWN STRL REUS W/TWL XL LVL3 (GOWN DISPOSABLE) ×2 IMPLANT
IRRIG SUCT STRYKERFLOW 2 WTIP (MISCELLANEOUS) ×2
IRRIGATION SUCT STRKRFLW 2 WTP (MISCELLANEOUS) IMPLANT
KIT BASIN OR (CUSTOM PROCEDURE TRAY) ×2 IMPLANT
LIGASURE VESSEL 5MM BLUNT TIP (ELECTROSURGICAL) ×1 IMPLANT
RELOAD 45 VASCULAR/THIN (ENDOMECHANICALS) ×4 IMPLANT
RELOAD STAPLE 45 2.5 WHT GRN (ENDOMECHANICALS) IMPLANT
SPONGE LAP 18X18 X RAY DECT (DISPOSABLE) ×1 IMPLANT
STAPLER VISISTAT (STAPLE) ×1 IMPLANT
SUT PDS AB 0 CTX 60 (SUTURE) ×2 IMPLANT
SUT VICRYL 0 UR6 27IN ABS (SUTURE) ×3 IMPLANT
SYS LAPSCP GELPORT 120MM (MISCELLANEOUS) ×2
SYSTEM LAPSCP GELPORT 120MM (MISCELLANEOUS) ×1 IMPLANT
TOWEL OR 17X26 10 PK STRL BLUE (TOWEL DISPOSABLE) ×4 IMPLANT
TRAY FOLEY CATH 16FRSI W/METER (SET/KITS/TRAYS/PACK) ×2 IMPLANT
TRAY LAPAROSCOPIC (CUSTOM PROCEDURE TRAY) ×2 IMPLANT
TROCAR UNIVERSAL OPT 12M 100M (ENDOMECHANICALS) ×2 IMPLANT
TROCAR XCEL 12X100 BLDLESS (ENDOMECHANICALS) ×2 IMPLANT
TROCAR XCEL BLUNT TIP 100MML (ENDOMECHANICALS) ×2 IMPLANT
TUBING INSUF HEATED (TUBING) ×2 IMPLANT
YANKAUER SUCT BULB TIP 10FT TU (MISCELLANEOUS) ×2 IMPLANT

## 2017-09-19 NOTE — Interval H&P Note (Signed)
History and Physical Interval Note:  09/19/2017 7:14 AM  Beth [name]  has presented today for surgery, with the diagnosis of LEFT RENAL MASS  The various methods of treatment have been discussed with the patient and family. After consideration of risks, benefits and other options for treatment, the patient has consented to  Procedure(s): LEFT HAND ASSISTED LAPAROSCOPIC NEPHRECTOMY (Left) as a surgical intervention .  The patient's history has been reviewed, patient examined, no change in status, stable for surgery.  I have reviewed the patient's chart and labs.  Questions were answered to the patient's satisfaction.     Marton Redwood, III

## 2017-09-19 NOTE — Op Note (Signed)
Operative Note  Preoperative diagnosis:  1.  Left renal mass  Postoperative diagnosis: 1.  Left renal mass  Procedure(s): 1.  Left hand assisted laparoscopic radical nephrectomy  Surgeon: Link Snuffer, MD  Assistants: Fredrik Rigger, MD, resident -- an assistant was needed throughout the case further expertise in assisting with a laparoscopic surgery, including visualization with the camera, passing instruments, etc.  Anesthesia: General  Complications: None  EBL: 100 cc  Specimens: 1.  Left kidney  Drains/Catheters: 1. Foley catheter  Intraoperative findings: Left kidney removed entirely  Indication: 62 year old female who was found on imaging to have a left renal mass concerning for renal cell carcinoma.  After discussion of different options, the patient elected to undergo the above operation.  Description of procedure:  The patient was identified and consent was obtained.  The patient was taken to the operating room and placed in the supine position.  The patient was placed under general anesthesia.  Perioperative antibiotics were administered.  The patient was placed in lateral position with left side up at approximately 65 degrees and all pressure points were padded.  Patient was prepped and draped in a standard sterile fashion and a timeout was performed.  An 8 cm periumbilical incision was made sharply into the skin.  This was carried down with Bovie electrocautery down to the anterior rectus sheath which was divided with electrocautery.  The underlying musculature was separated in the midline.  Sharp dissection with Metzenbaum scissors was used to open up the posterior sheath and peritoneum.  This was extended with electrocautery taking great care not to use cautery near the bowel.  The hand assist port was secured into the incision.  I made sure no bowel was trapped within this.  A 12 mm port was inserted through the hand assist port and the abdomen was insufflated to a  pressure of 15.  A 12 mm port was placed lateral as well as superior to the hand assist port, each 1 about a hand width away. Please note that all ports were placed under direct visualization with the camera.  The colon was first dissected medially by incising along the white line of Toldt.  After medializing the colon, the kidney was dissected laterally and medially as well as superiorly.  Inferior attachments as well as the ureter and gonadal vein were divided with LigaSure device. I continued to carefully dissect medially and identified the renal hilum.  The renal vein and renal artery were divided with a 45 mm vascular staple load.  Superior attachments were then released using LigaSure device as well as blunt dissection.  Once the entire kidney and surrounding Gerota's fascia was freed, the specimen was withdrawn from the midline incision and passed off for permanent specimen.  The abdomen was reinspected and no active bleeding was noted.  The midline fascia was closed with running 0 looped PDS suture followed by staples.  The port incisions were closed with a 2-0 Vicryl followed by staples.  Exparel was instilled for anesthetic effect.  A dressing was applied.  Patient tolerated the procedure well and was stable postoperatively.  Plan: Stat labs will be obtained.  Anticipate the patient will be in the hospital 1-2 nights as long as he does well.

## 2017-09-19 NOTE — H&P (Signed)
CC: I have a renal mass.  HPI: Beth [name] is a 62 year-old female patient who was referred by Dr. Darcus Austin, DO who is here for a renal mass.  The problem is on the left side. Her problem was diagnosed 08/20/2017. The diagnosis was made by Dr Gloriann Loan. She has had no symptoms. She had the following x-rays done: CT Scan. She has not had kidney surgery.   She has not seen blood in her urine. She does not have a good appetite. BOWEL HABITS: her bowels are moving normally. She is not having pain in new locations. She has not recently had unwanted weight loss.   The patient feels like she has had some anxiety as well as decrease in appetite. She also has some fatigue and poor appetite. She has also been having some left upper quadrant and epigastric cramping. She also has pain with bowel movements. For this reason, she went to the emergency department on 08/20/2017. CT of the abdomen and pelvis with contrast was performed which revealed a 5.4 cm enhancing left renal mass with no evidence of involvement of the renal vein. There was no lymphadenopathy or bone lesions. She did have 3 liver lesions but these were compatible with hemangiomas. She denies any gross hematuria. Creatinine was 0.77. Right kidney appeared normal on CT scan. She has had a C-section past 3. No other abdominal surgeries.   ALLERGIES: acyclovir Bactrim Cymbalta   MEDICATIONS: Omeprazole  Alprazolam  Bupropion Hcl  Cefuroxime  Escitalopram Oxalate  Hydroxychloroquine Sulfate  Linzess  Magnesium  Meloxicam  Vitamin D3    GU PSH: Hysterectomy   NON-GU PSH: Cesarean Delivery   GU PMH: None     PMH Notes:  1898-05-17 00:00:00 - Note: Normal Routine History And Physical Adult   NON-GU PMH: Depression Hyperlipidemia, unspecified Hypertension Obstructive sleep apnea (adult) (pediatric) Systemic lupus erythematosus, unspecified   FAMILY HISTORY: None   SOCIAL HISTORY: None   REVIEW OF SYSTEMS:    GU Review  Female:   Patient denies frequent urination, hard to postpone urination, burning /pain with urination, get up at night to urinate, leakage of urine, stream starts and stops, trouble starting your stream, have to strain to urinate, and being pregnant.  Gastrointestinal (Upper):   Patient denies nausea, vomiting, and indigestion/ heartburn.  Gastrointestinal (Lower):   Patient denies diarrhea and constipation.  Constitutional:   Patient denies fever, night sweats, weight loss, and fatigue.  Skin:   Patient denies skin rash/ lesion and itching.  Eyes:   Patient denies blurred vision and double vision.  Ears/ Nose/ Throat:   Patient denies sore throat and sinus problems.  Hematologic/Lymphatic:   Patient denies swollen glands and easy bruising.  Cardiovascular:   Patient denies leg swelling and chest pains.  Respiratory:   Patient denies cough and shortness of breath.  Endocrine:   Patient denies excessive thirst.  Musculoskeletal:   Patient denies back pain and joint pain.  Neurological:   Patient denies headaches and dizziness.  Psychologic:   Patient denies depression and anxiety.   VITAL SIGNS:      08/22/2017 03:36 PM  Weight 171.3 lb / 77.7 kg  Height 61.5 in / 156.21 cm  BP 134/86 mmHg  Pulse 76 /min  BMI 31.8 kg/m   MULTI-SYSTEM PHYSICAL EXAMINATION:    Constitutional: Well-nourished. No physical deformities. Normally developed. Good grooming.  Respiratory: No labored breathing, no use of accessory muscles. Normal breath sounds.  Cardiovascular: Normal temperature, adequate perfusion of extremities  Skin: No paleness, no jaundice  Neurologic / Psychiatric: Oriented to time, oriented to place, oriented to person. No depression, no anxiety, no agitation.  Gastrointestinal: No mass, no tenderness, no rigidity, mildly obese abdomen. Lower midline abdominal incision extending below the umbilicus down to the pubic bone from her prior C-sections  Eyes: Normal conjunctivae. Normal eyelids.   Musculoskeletal: Normal gait and station of head and neck.    PAST DATA REVIEWED:  Source Of History:  Patient   PROCEDURES:         Urinalysis - 81003 Dipstick Dipstick Cont'd Micro  Color: Yellow Bilirubin: Neg WBC/hpf: 0 - 5/hpf  Appearance: Clear Ketones: Trace RBC/hpf: 0 - 2/hpf  Specific Gravity: 1.025 Blood: Neg Bacteria: Few (10-25/hpf)  pH: 6.0 Protein: Trace Cystals: NS (Not Seen)  Glucose: Neg Urobilinogen: 0.2 Casts: NS (Not Seen)    Nitrites: Neg Trichomonas: Not Present    Leukocyte Esterase: Trace Mucous: Not Present      Epithelial Cells: 6 - 10/hpf      Yeast: NS (Not Seen)      Sperm: Not Present   Notes:    ASSESSMENT:      ICD-10 Details  1 GU:   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 Abdomen With and Without I.V. Contrast    1 Week - Chest X-Ray Outside Without Contrast  Return Visit/Planned Activity: 1 Week - C.T. Abdomen         Document Letter(s):  Created for Patient: Clinical Summary        Notes:   I spoke to the patient regarding the renal mass. I explained the natural history of renal cancer and the way in which the stage, pathological features, patient health, age, life expectancy, and quality of life all affect the decision-making process, prognosis, and need for additional studies. I also explained how all of these things affect the recommended treatment strategies. I presented all of the standard management options for renal cancer and their associated cancer control outcomes, impact on quality of life, likelihood of achieving goals, risks, complications, and benefits.   We discussed the gold standard for treatment of renal cancer is surgery. I also explained that surveillance would be tailored to the patient based upon the results of the pathology.  We discussed surgery and its different forms including open surgery,  laparoscopic surgery, and robotic surgery. I described the risks which included, but are not limited to, bleeding, infection, allergic reaction, heart attack, stroke, pulmonary embolus, death, positioning injury, damage to contiguous structures, need for blood transfusion (and its risk of infection, transfusion reaction, anaphylactic reaction, and death), nerve injury, vascular injury, urinary leak, fistula, renal failure, and hernia. We also discussed that any laparoscopic or robotic surgery might need to be converted to an open surgery should difficulties be encountered. We discussed in detail the expectations of surgery with regard to cancer control, as well as expected post-operative recovery process. We also discussed long term outcomes and risks.  I also explained that based on the masses location, the mass appears to NOT be amenable to robotic-assisted laparoscopic partial nephrectomy and would require radical nephrectomy.   We discussed other management options and their risks, benefits. These include biopsy of the mass, cryotherapy, and RFA. We discussed that biopsy can be  associated with false negatives, false positives, and risk of spread of the cancer as well as damage to the kidney and bleeding. I generally do not recommend biopsy of the mass unless we are concerned that the mass is a metastasis from another source or unless the patient is not a good surgical candidate or if there is a concern for lymphoma or abscess. We discussed that thermal ablation is generally reserved for patients who are not good surgical candidates or wish to avoid surgery. We discuss that thermal ablation is inferior to surgery for cancer control and due to the masses location, this is a poor option as well.  The patient was encouraged to ask questions throughout the discussion today and all questions were answered to the patient's stated satisfaction. The patient was able to ask pertinent questions showing an understanding  of the situation.   Proceed with left hand-assisted laparoscopic radical nephrectomy, possible open conversion. The fully characterize the mass, recommend CT renal mass protocol which includes a scan without followed by with contrast. Also obtain chest x-ray.         Next Appointment:      Next Appointment: 08/29/2017 02:15 PM    Appointment Type: CT Scan    Location: [facility] [phone]    Provider: CT CT    Reason for Visit: ct a w/wo Beth [name] Renal Neoplasm     Signed by Link Snuffer, III, M.D. on 08/23/17 at 10:40 AM (EDT

## 2017-09-19 NOTE — Transfer of Care (Signed)
Immediate Anesthesia Transfer of Care Note  Patient: Beth [name]  Procedure(s) Performed: LEFT HAND ASSISTED LAPAROSCOPIC NEPHRECTOMY (Left )  Patient Location: PACU  Anesthesia Type:General  Level of Consciousness: awake, alert  and oriented  Airway & Oxygen Therapy: Patient Spontanous Breathing and Patient connected to face mask oxygen  Post-op Assessment: Report given to RN and Post -op Vital signs reviewed and stable  Post vital signs: Reviewed and stable  Last Vitals:  Vitals Value Taken Time  BP 151/85 09/19/2017  9:41 AM  Temp    Pulse 80 09/19/2017  9:43 AM  Resp 9 09/19/2017  9:43 AM  SpO2 91 % 09/19/2017  9:43 AM  Vitals shown include unvalidated device data.  Last Pain:  Vitals:   09/19/17 0605  TempSrc:   PainSc: 3       Patients Stated Pain Goal: 3 (46/50/35 [id number])  Complications: No apparent anesthesia complications

## 2017-09-19 NOTE — Anesthesia Postprocedure Evaluation (Signed)
Anesthesia Post Note  Patient: Beth [name]  Procedure(s) Performed: LEFT HAND ASSISTED LAPAROSCOPIC NEPHRECTOMY (Left )     Patient location during evaluation: PACU Anesthesia Type: General Level of consciousness: awake and alert Pain management: pain level controlled Vital Signs Assessment: post-procedure vital signs reviewed and stable Respiratory status: spontaneous breathing, nonlabored ventilation, respiratory function stable and patient connected to nasal cannula oxygen Cardiovascular status: blood pressure returned to baseline and stable Postop Assessment: no apparent nausea or vomiting Anesthetic complications: no    Last Vitals:  Vitals:   09/19/17 1056 09/19/17 1303  BP: 107/63 112/66  Pulse: 64 60  Resp: 16 18  Temp: 36.6 C 36.8 C  SpO2: 100%     Last Pain:  Vitals:   09/19/17 1442  TempSrc:   PainSc: 10-Worst pain ever                 Augustina Braddock

## 2017-09-19 NOTE — Progress Notes (Signed)
Pt. s/u with cpap, has own hose and nasal pillows, humidifier filled with S/W, det in Auto-mode <5/>25, placed on at this time, tolerating well, made aware to notify if needed.

## 2017-09-19 NOTE — Anesthesia Procedure Notes (Signed)
Procedure Name: Intubation Date/Time: 09/19/2017 7:40 AM Performed by: Gwyndolyn Saxon, CRNA Pre-anesthesia Checklist: Patient identified, Emergency Drugs available, Suction available, Patient being monitored and Timeout performed Patient Re-evaluated:Patient Re-evaluated prior to induction Oxygen Delivery Method: Circle system utilized Preoxygenation: Pre-oxygenation with 100% oxygen Induction Type: IV induction Ventilation: Mask ventilation without difficulty Laryngoscope Size: Miller and 2 Grade View: Grade I Tube type: Oral Tube size: 7.0 mm Number of attempts: 1 Airway Equipment and Method: Patient positioned with wedge pillow Placement Confirmation: ETT inserted through vocal cords under direct vision,  positive ETCO2,  CO2 detector and breath sounds checked- equal and bilateral Secured at: 21 cm Tube secured with: Tape Dental Injury: Teeth and Oropharynx as per pre-operative assessment

## 2017-09-20 ENCOUNTER — Other Ambulatory Visit: Payer: Self-pay | Admitting: Physician Assistant

## 2017-09-20 ENCOUNTER — Encounter (HOSPITAL_COMMUNITY): Payer: Self-pay | Admitting: Urology

## 2017-09-20 ENCOUNTER — Encounter: Payer: Self-pay | Admitting: Physician Assistant

## 2017-09-20 LAB — BASIC METABOLIC PANEL
Anion gap: 5 (ref 5–15)
BUN: 14 mg/dL (ref 6–20)
CO2: 24 mmol/L (ref 22–32)
Calcium: 8.6 mg/dL — ABNORMAL LOW (ref 8.9–10.3)
Chloride: 110 mmol/L (ref 101–111)
Creatinine, Ser: 1.16 mg/dL — ABNORMAL HIGH (ref 0.44–1.00)
GFR calc Af Amer: 57 mL/min — ABNORMAL LOW (ref >60)
GFR calc non Af Amer: 49 mL/min — ABNORMAL LOW (ref >60)
Glucose, Bld: 108 mg/dL — ABNORMAL HIGH (ref 65–99)
Potassium: 4.1 mmol/L (ref 3.5–5.1)
Sodium: 139 mmol/L (ref 135–145)

## 2017-09-20 LAB — HEMOGLOBIN AND HEMATOCRIT, BLOOD
HCT: 34.6 % — ABNORMAL LOW (ref 36.0–46.0)
Hemoglobin: 11.2 g/dL — ABNORMAL LOW (ref 12.0–15.0)

## 2017-09-20 LAB — HIV ANTIBODY (ROUTINE TESTING W REFLEX): HIV Screen 4th Generation wRfx: NONREACTIVE

## 2017-09-20 MED ORDER — ALPRAZOLAM 0.5 MG PO TABS: 0.5000 mg | ORAL_TABLET | Freq: Three times a day (TID) | ORAL | 0 refills | Status: DC | PRN

## 2017-09-20 NOTE — Progress Notes (Signed)
Pt ambulated throughout the shift tol well, tol diet without nausea, pain control 4-5/10 now. Enc IS tol--will cont to monitor. SRP, RN

## 2017-09-20 NOTE — Progress Notes (Signed)
Pt. had CPAP on upon my arrival, no c/o, tolerating well.

## 2017-09-20 NOTE — Progress Notes (Addendum)
Urology Progress Note   1 Day Post-Op  Subjective: Doing well post op. Pain controlled. No fever or chills.   Objective: Vital signs in last 24 hours: Temp:  [97.6 F (36.4 C)-98.8 F (37.1 C)] 98.8 F (37.1 C) (05/07 0531) Pulse Rate:  [60-89] 76 (05/07 0531) Resp:  [10-18] 18 (05/07 0531) BP: (105-151)/(63-85) 139/72 (05/07 0531) SpO2:  [94 %-100 %] 99 % (05/07 0531)  Intake/Output from previous day: 05/06 0701 - 05/07 0700 In: 2017.9 [I.V.:1967.9; IV Piggyback:50] Out: 2947 [Urine:1750; Blood:25] Intake/Output this shift: No intake/output data recorded.  Physical Exam:  General: Alert and oriented CV: adequate perfusion Lungs: NLR Abdomen: Soft, appropriately tender. incision c/d/i. Staples in place GU: Foley in place draining clear yellow urine  Ext: NT, No erythema  Lab Results: Recent Labs    09/19/17 0949 09/20/17 0512  HGB 12.7 11.2*  HCT 39.5 34.6*   BMET Recent Labs    09/19/17 0949 09/20/17 0512  NA 141 139  K 4.1 4.1  CL 110 110  CO2 23 24  GLUCOSE 137* 108*  BUN 13 14  CREATININE 0.78 1.16*  CALCIUM 8.6* 8.6*     Studies/Results: No results found.  Assessment/Plan:  62 y.o. female s/p left hand assisted Nx.  Overall doing well post-op.   - DC foley - Regular diet  - Medlock  - PRN pain meds  - Restart all home medications   Dispo: possibly today vs tomorrow    LOS: 1 day   Alla Feeling, MD 09/20/2017, 7:44 AM

## 2017-09-21 NOTE — Discharge Summary (Addendum)
[facility] Discharge Summary  Admit date: 09/19/2017  Discharge date and time: 09/21/17   Discharge to: Home  Discharge Service: Urology  Discharge Attending Physician:  Gloriann Loan   Discharge  Diagnoses: <principal problem not specified>  Secondary Diagnosis: Active Problems:   Kidney mass   OR Procedures: Procedure(s): LEFT HAND ASSISTED LAPAROSCOPIC NEPHRECTOMY 09/19/2017   Ancillary Procedures: None   Discharge Day Services: The patient was seen and examined by the Urology team both in the morning and immediately prior to discharge.  Vital signs and laboratory values were stable and within normal limits.  The physical exam was benign and unchanged and all surgical wounds were examined.  Discharge instructions were explained and all questions answered.  Subjective  No acute events overnight. Pain Controlled. No fever or chills.  Objective Patient Vitals for the past 8 hrs:  BP Temp Pulse Resp SpO2  09/21/17 0526 - - 60 - 99 %  09/21/17 0524 (!) 135/58 98.4 F (36.9 C) (!) 59 18 -   No intake/output data recorded.  General Appearance:        No acute distress Lungs:                       Normal work of breathing on room air Heart:                                Regular rate and rhythm Abdomen:                         Soft, non-tender, non-distended. Incisions c/d/i. Staples in place Extremities:                      Warm and well perfused   Hospital Course:  The patient underwent Left hand assisted laparoscopic radical nephrectomy on 09/19/2017.  The patient tolerated the procedure well, was extubated in the OR, and afterwards was taken to the PACU for routine post-surgical care. When stable the patient was transferred to the floor.   The patient did well postoperatively.  The patient's diet was slowly advanced and at the time of discharge was tolerating a regular diet.  The patient was discharged home 2 Days Post-Op, at which point was tolerating a regular solid diet, was  able to void spontaneously, have adequate pain control with P.O. pain medication, and could ambulate without difficulty. The patient will follow up with [place] for post op check.   Condition at Discharge: Improved  Discharge Medications:  Allergies as of 09/21/2017      Reactions   Bactrim [sulfamethoxazole-trimethoprim]    GI upset   Cymbalta [duloxetine Hcl]    Sweating   Acyclovir And Related Itching      Medication List    TAKE these medications   ALPRAZolam 0.5 MG tablet Commonly known as:  XANAX Take 1 tablet (0.5 mg total) by mouth 3 (three) times daily as needed for anxiety. What changed:  See the new instructions.   escitalopram 20 MG tablet Commonly known as:  LEXAPRO Take 1 tablet (20 mg total) by mouth daily.   HYDROcodone-acetaminophen 5-325 MG tablet Commonly known as:  NORCO/VICODIN Take 1 tablet by mouth every 4 (four) hours as needed for up to 7 days for moderate pain.   hydroxychloroquine 200 MG tablet Commonly known as:  PLAQUENIL Take 2 tablets (400 mg total) daily by mouth.  Magnesium 500 MG Tabs Take 250 mg by mouth daily.   Vitamin D 2000 units Caps Take 1 capsule by mouth daily.

## 2017-10-03 ENCOUNTER — Other Ambulatory Visit: Payer: Self-pay | Admitting: Physician Assistant

## 2017-10-03 DIAGNOSIS — G8929 Other chronic pain: Secondary | ICD-10-CM

## 2017-10-03 DIAGNOSIS — M25561 Pain in right knee: Principal | ICD-10-CM

## 2017-10-07 ENCOUNTER — Encounter: Payer: Self-pay | Admitting: Physician Assistant

## 2017-10-09 ENCOUNTER — Emergency Department (HOSPITAL_COMMUNITY): Payer: BC Managed Care – PPO

## 2017-10-09 ENCOUNTER — Other Ambulatory Visit: Payer: Self-pay

## 2017-10-09 ENCOUNTER — Encounter (HOSPITAL_COMMUNITY): Payer: Self-pay | Admitting: Emergency Medicine

## 2017-10-09 ENCOUNTER — Observation Stay (HOSPITAL_COMMUNITY)
Admission: EM | Admit: 2017-10-09 | Discharge: 2017-10-11 | Disposition: A | Discharge status: Home / Self Care | Payer: BC Managed Care – PPO | Attending: Internal Medicine | Admitting: Internal Medicine

## 2017-10-09 DIAGNOSIS — E785 Hyperlipidemia, unspecified: Secondary | ICD-10-CM | POA: Insufficient documentation

## 2017-10-09 DIAGNOSIS — G4733 Obstructive sleep apnea (adult) (pediatric): Secondary | ICD-10-CM | POA: Diagnosis not present

## 2017-10-09 DIAGNOSIS — F419 Anxiety disorder, unspecified: Secondary | ICD-10-CM | POA: Diagnosis not present

## 2017-10-09 DIAGNOSIS — E669 Obesity, unspecified: Secondary | ICD-10-CM | POA: Insufficient documentation

## 2017-10-09 DIAGNOSIS — F329 Major depressive disorder, single episode, unspecified: Secondary | ICD-10-CM | POA: Insufficient documentation

## 2017-10-09 DIAGNOSIS — Z888 Allergy status to other drugs, medicaments and biological substances status: Secondary | ICD-10-CM | POA: Insufficient documentation

## 2017-10-09 DIAGNOSIS — I2699 Other pulmonary embolism without acute cor pulmonale: Principal | ICD-10-CM | POA: Insufficient documentation

## 2017-10-09 DIAGNOSIS — Z86711 Personal history of pulmonary embolism: Secondary | ICD-10-CM | POA: Diagnosis present

## 2017-10-09 DIAGNOSIS — Z882 Allergy status to sulfonamides status: Secondary | ICD-10-CM | POA: Insufficient documentation

## 2017-10-09 DIAGNOSIS — Z79899 Other long term (current) drug therapy: Secondary | ICD-10-CM | POA: Insufficient documentation

## 2017-10-09 DIAGNOSIS — R7303 Prediabetes: Secondary | ICD-10-CM | POA: Diagnosis not present

## 2017-10-09 DIAGNOSIS — Z905 Acquired absence of kidney: Secondary | ICD-10-CM | POA: Diagnosis not present

## 2017-10-09 DIAGNOSIS — M329 Systemic lupus erythematosus, unspecified: Secondary | ICD-10-CM | POA: Diagnosis present

## 2017-10-09 DIAGNOSIS — G2581 Restless legs syndrome: Secondary | ICD-10-CM | POA: Diagnosis not present

## 2017-10-09 DIAGNOSIS — E559 Vitamin D deficiency, unspecified: Secondary | ICD-10-CM | POA: Insufficient documentation

## 2017-10-09 DIAGNOSIS — F32A Depression, unspecified: Secondary | ICD-10-CM

## 2017-10-09 DIAGNOSIS — C649 Malignant neoplasm of unspecified kidney, except renal pelvis: Secondary | ICD-10-CM | POA: Diagnosis not present

## 2017-10-09 DIAGNOSIS — Z6832 Body mass index (BMI) 32.0-32.9, adult: Secondary | ICD-10-CM | POA: Insufficient documentation

## 2017-10-09 DIAGNOSIS — R110 Nausea: Secondary | ICD-10-CM | POA: Diagnosis not present

## 2017-10-09 DIAGNOSIS — Z85528 Personal history of other malignant neoplasm of kidney: Secondary | ICD-10-CM

## 2017-10-09 DIAGNOSIS — R079 Chest pain, unspecified: Secondary | ICD-10-CM | POA: Diagnosis present

## 2017-10-09 LAB — COMPREHENSIVE METABOLIC PANEL
ALT: 16 U/L (ref 14–54)
AST: 19 U/L (ref 15–41)
Albumin: 3.4 g/dL — ABNORMAL LOW (ref 3.5–5.0)
Alkaline Phosphatase: 52 U/L (ref 38–126)
Anion gap: 8 (ref 5–15)
BUN: 13 mg/dL (ref 6–20)
CO2: 24 mmol/L (ref 22–32)
Calcium: 9 mg/dL (ref 8.9–10.3)
Chloride: 107 mmol/L (ref 101–111)
Creatinine, Ser: 1.14 mg/dL — ABNORMAL HIGH (ref 0.44–1.00)
GFR calc Af Amer: 58 mL/min — ABNORMAL LOW (ref >60)
GFR calc non Af Amer: 50 mL/min — ABNORMAL LOW (ref >60)
Glucose, Bld: 118 mg/dL — ABNORMAL HIGH (ref 65–99)
Potassium: 4.5 mmol/L (ref 3.5–5.1)
Sodium: 139 mmol/L (ref 135–145)
Total Bilirubin: 0.4 mg/dL (ref 0.3–1.2)
Total Protein: 7.7 g/dL (ref 6.5–8.1)

## 2017-10-09 LAB — CBC
HCT: 36.9 % (ref 36.0–46.0)
Hemoglobin: 12 g/dL (ref 12.0–15.0)
MCH: 29.8 pg (ref 26.0–34.0)
MCHC: 32.5 g/dL (ref 30.0–36.0)
MCV: 91.6 fL (ref 78.0–100.0)
Platelets: 227 10*3/uL (ref 150–400)
RBC: 4.03 MIL/uL (ref 3.87–5.11)
RDW: 13.8 % (ref 11.5–15.5)
WBC: 7.7 10*3/uL (ref 4.0–10.5)

## 2017-10-09 LAB — APTT: aPTT: 35 seconds (ref 24–36)

## 2017-10-09 LAB — I-STAT TROPONIN, ED: Troponin i, poc: 0 ng/mL (ref 0.00–0.08)

## 2017-10-09 LAB — URINALYSIS, ROUTINE W REFLEX MICROSCOPIC
Bilirubin Urine: NEGATIVE
Glucose, UA: NEGATIVE mg/dL
Hgb urine dipstick: NEGATIVE
Ketones, ur: NEGATIVE mg/dL
Leukocytes, UA: NEGATIVE
Nitrite: NEGATIVE
Protein, ur: NEGATIVE mg/dL
Specific Gravity, Urine: 1.01 (ref 1.005–1.030)
pH: 6.5 (ref 5.0–8.0)

## 2017-10-09 LAB — D-DIMER, QUANTITATIVE: D-Dimer, Quant: 1.77 ug/mL-FEU — ABNORMAL HIGH (ref 0.00–0.50)

## 2017-10-09 LAB — LIPASE, BLOOD: Lipase: 37 U/L (ref 11–51)

## 2017-10-09 LAB — PROTIME-INR
INR: 1.01
Prothrombin Time: 13.2 seconds (ref 11.4–15.2)

## 2017-10-09 LAB — HEPARIN LEVEL (UNFRACTIONATED): Heparin Unfractionated: 0.81 IU/mL — ABNORMAL HIGH (ref 0.30–0.70)

## 2017-10-09 MED ORDER — HEPARIN (PORCINE) IN NACL 100-0.45 UNIT/ML-% IJ SOLN
1100.0000 [IU]/h | INTRAMUSCULAR | Status: DC
  Administered 2017-10-09 – 2017-10-10 (×2): 1100 [IU]/h via INTRAVENOUS
  Filled 2017-10-09 (×2): qty 250

## 2017-10-09 MED ORDER — IOPAMIDOL (ISOVUE-370) INJECTION 76%
100.0000 mL | Freq: Once | INTRAVENOUS | Status: AC | PRN
  Administered 2017-10-09: 85 mL via INTRAVENOUS

## 2017-10-09 MED ORDER — ACETAMINOPHEN 650 MG RE SUPP: 650.0000 mg | Freq: Four times a day (QID) | RECTAL | Status: DC | PRN

## 2017-10-09 MED ORDER — ONDANSETRON HCL 4 MG PO TABS: 4.0000 mg | ORAL_TABLET | Freq: Four times a day (QID) | ORAL | Status: DC | PRN

## 2017-10-09 MED ORDER — IOPAMIDOL (ISOVUE-370) INJECTION 76%
INTRAVENOUS | Status: AC
  Filled 2017-10-09: qty 100

## 2017-10-09 MED ORDER — MORPHINE SULFATE (PF) 2 MG/ML IV SOLN
2.0000 mg | INTRAVENOUS | Status: DC | PRN
  Administered 2017-10-09: 4 mg via INTRAVENOUS
  Filled 2017-10-09: qty 2

## 2017-10-09 MED ORDER — MORPHINE SULFATE (PF) 4 MG/ML IV SOLN
4.0000 mg | Freq: Once | INTRAVENOUS | Status: AC
  Administered 2017-10-09: 4 mg via INTRAVENOUS
  Filled 2017-10-09: qty 1

## 2017-10-09 MED ORDER — SODIUM CHLORIDE 0.9 % IV BOLUS
500.0000 mL | Freq: Once | INTRAVENOUS | Status: AC
  Administered 2017-10-09: 500 mL via INTRAVENOUS

## 2017-10-09 MED ORDER — LINACLOTIDE 145 MCG PO CAPS
290.0000 ug | ORAL_CAPSULE | Freq: Every day | ORAL | Status: DC
  Administered 2017-10-10: 290 ug via ORAL
  Filled 2017-10-09 (×2): qty 2

## 2017-10-09 MED ORDER — ACETAMINOPHEN 325 MG PO TABS
650.0000 mg | ORAL_TABLET | Freq: Four times a day (QID) | ORAL | Status: DC | PRN
  Administered 2017-10-10 (×2): 650 mg via ORAL
  Filled 2017-10-09 (×2): qty 2

## 2017-10-09 MED ORDER — ALPRAZOLAM 0.5 MG PO TABS: 0.5000 mg | ORAL_TABLET | Freq: Three times a day (TID) | ORAL | Status: DC | PRN

## 2017-10-09 MED ORDER — ONDANSETRON HCL 4 MG/2ML IJ SOLN
4.0000 mg | Freq: Once | INTRAMUSCULAR | Status: AC
  Administered 2017-10-09: 4 mg via INTRAVENOUS
  Filled 2017-10-09: qty 2

## 2017-10-09 MED ORDER — ESCITALOPRAM OXALATE 20 MG PO TABS
20.0000 mg | ORAL_TABLET | Freq: Every day | ORAL | Status: DC
  Administered 2017-10-10 – 2017-10-11 (×2): 20 mg via ORAL
  Filled 2017-10-09 (×2): qty 1

## 2017-10-09 MED ORDER — SENNOSIDES-DOCUSATE SODIUM 8.6-50 MG PO TABS: 1.0000 | ORAL_TABLET | Freq: Every evening | ORAL | Status: DC | PRN

## 2017-10-09 MED ORDER — HEPARIN BOLUS VIA INFUSION
4000.0000 [IU] | Freq: Once | INTRAVENOUS | Status: AC
  Administered 2017-10-09: 4000 [IU] via INTRAVENOUS
  Filled 2017-10-09: qty 4000

## 2017-10-09 MED ORDER — ONDANSETRON HCL 4 MG/2ML IJ SOLN: 4.0000 mg | Freq: Four times a day (QID) | INTRAMUSCULAR | Status: DC | PRN

## 2017-10-09 MED ORDER — HYDROXYCHLOROQUINE SULFATE 200 MG PO TABS
400.0000 mg | ORAL_TABLET | Freq: Every day | ORAL | Status: DC
  Administered 2017-10-09 – 2017-10-11 (×3): 400 mg via ORAL
  Filled 2017-10-09 (×3): qty 2

## 2017-10-09 NOTE — Progress Notes (Signed)
ANTICOAGULATION CONSULT NOTE - Initial Consult  Pharmacy Consult for IV heparin Indication: pulmonary embolus  Allergies  Allergen Reactions  . Bactrim [Sulfamethoxazole-Trimethoprim]     GI upset  . Cymbalta [Duloxetine Hcl]     Sweating  . Acyclovir And Related Itching    Patient Measurements: Height: 5' 1.5" (156.2 cm) Weight: 176 lb (79.8 kg) IBW/kg (Calculated) : 48.95 Heparin Dosing Weight: 58 kg  Vital Signs: Temp: 98.9 F (37.2 C) (05/26 0941) Temp Source: Oral (05/26 0941) BP: 136/78 (05/26 1304) Pulse Rate: 89 (05/26 1304)  Labs: Recent Labs    10/09/17 0947  HGB 12.0  HCT 36.9  PLT 227  CREATININE 1.14*    Estimated Creatinine Clearance: 49.5 mL/min (A) (by C-G formula based on SCr of 1.14 mg/dL (H)).   Medical History: Past Medical History:  Diagnosis Date  . Anxiety   . Arthritis    pt denies  . Cystocele   . Depression   . Hypertension    Labile  pt. denies no meds  . Lupus ([place])   . Pre-diabetes   . Prediabetes   . Seizure ([place])   . Sleep apnea   . Vitamin D deficiency     Medications:  Scheduled:  . heparin  4,000 Units Intravenous Once  . iopamidol       Infusions:  . heparin      Assessment: 69 yoF c/o left side pain.  CT + LLL PE with right heart strain. IV heparin for PE.  Goal of Therapy:  Heparin level 0.3-0.7 units/ml Monitor platelets by anticoagulation protocol: Yes   Plan:  Baseline aPtt and PT/NR STAT Heparin 4000 unit bolus x1 now Start drip at 1100 units/hr Daily CBC/HL Check 1st HL in 6 hours  Lawana Pai R 10/09/2017,1:22 PM

## 2017-10-09 NOTE — Progress Notes (Signed)
Pt arrived from ED on stretcher, ambulated to floor bed w/ steady, independent gait. VSS. A&Ox4.  Oriented to callbell and environment. POC discussed.

## 2017-10-09 NOTE — ED Triage Notes (Signed)
Pt reports that she is having left side pains since yesterday morning. Pt reports nausea but denies any v/d or urinary problems. Reports had left kidney removed May 6th.

## 2017-10-09 NOTE — ED Notes (Signed)
ED TO INPATIENT HANDOFF REPORT  Name/Age/Gender Beth [name] 62 y.o. female  Code Status Code Status History    Date Active Date Inactive Code Status Order ID Comments User Context   09/19/2017 0926 09/21/2017 1558 Full Code [id number]  Alla Feeling, MD Inpatient      Home/SNF/Other Home  Chief Complaint left flank pain  Level of Care/Admitting Diagnosis ED Disposition    ED Disposition Condition [facility] Area: [facility] [[id number]]  Level of Care: Telemetry [5]  Admit to tele based on following criteria: Monitor for Ischemic changes  Diagnosis: Pulmonary embolism [facility]) [[id number]]  Admitting Physician: Dessa Phi [phone]  Attending Physician: Dessa Phi [phone]  PT Class (Do Not Modify): Observation [104]  PT Acc Code (Do Not Modify): Observation [10022]       Medical History Past Medical History:  Diagnosis Date  . Anxiety   . Arthritis    pt denies  . Cystocele   . Depression   . Hypertension    Labile  pt. denies no meds  . Lupus ([place])   . Pre-diabetes   . Prediabetes   . Seizure ([place])   . Sleep apnea   . Vitamin D deficiency     Allergies Allergies  Allergen Reactions  . Bactrim [Sulfamethoxazole-Trimethoprim]     GI upset  . Cymbalta [Duloxetine Hcl]     Sweating  . Acyclovir And Related Itching    IV Location/Drains/Wounds Patient Lines/Drains/Airways Status   Active Line/Drains/Airways    Name:   Placement date:   Placement time:   Site:   Days:   Peripheral IV 09/19/17 Left Hand   09/19/17    0639    Hand   20   Peripheral IV 10/09/17 Right;Upper Arm   10/09/17    1034    Arm   less than 1   Urethral Catheter Thomasene Mohair, RN Latex 14 Fr.   09/19/17    0745    Latex   20   Incision (Closed) 09/19/17 Abdomen Left   09/19/17    0916     20   Incision - 2 Ports Abdomen Right;Upper Left;Lower   09/19/17    0815     20          Labs/Imaging Results for orders placed or performed during the  hospital encounter of 10/09/17 (from the past 48 hour(s))  Lipase, blood     Status: None   Collection Time: 10/09/17  9:47 AM  Result Value Ref Range   Lipase 37 11 - 51 U/L    Comment: Performed at [facility], [place] [address], [place], [place] [address]  Comprehensive metabolic panel     Status: Abnormal   Collection Time: 10/09/17  9:47 AM  Result Value Ref Range   Sodium 139 135 - 145 mmol/L   Potassium 4.5 3.5 - 5.1 mmol/L   Chloride 107 101 - 111 mmol/L   CO2 24 22 - 32 mmol/L   Glucose, Bld 118 (H) 65 - 99 mg/dL   BUN 13 6 - 20 mg/dL   Creatinine, Ser 1.14 (H) 0.44 - 1.00 mg/dL   Calcium 9.0 8.9 - 10.3 mg/dL   Total Protein 7.7 6.5 - 8.1 g/dL   Albumin 3.4 (L) 3.5 - 5.0 g/dL   AST 19 15 - 41 U/L   ALT 16 14 - 54 U/L   Alkaline Phosphatase 52 38 - 126 U/L   Total Bilirubin 0.4 0.3 -  1.2 mg/dL   GFR calc non Af Amer 50 (L) >60 mL/min   GFR calc Af Amer 58 (L) >60 mL/min    Comment: (NOTE) The eGFR has been calculated using the CKD EPI equation. This calculation has not been validated in all clinical situations. eGFR's persistently <60 mL/min signify possible Chronic Kidney Disease.    Anion gap 8 5 - 15    Comment: Performed at [facility], [place] [address] [place], [place] [address]  CBC     Status: None   Collection Time: 10/09/17  9:47 AM  Result Value Ref Range   WBC 7.7 4.0 - 10.5 K/uL   RBC 4.03 3.87 - 5.11 MIL/uL   Hemoglobin 12.0 12.0 - 15.0 g/dL   HCT 36.9 36.0 - 46.0 %   MCV 91.6 78.0 - 100.0 fL   MCH 29.8 26.0 - 34.0 pg   MCHC 32.5 30.0 - 36.0 g/dL   RDW 13.8 11.5 - 15.5 %   Platelets 227 150 - 400 K/uL    Comment: Performed at [facility], [place] [address], [place], [place] [address]  D-dimer, quantitative     Status: Abnormal   Collection Time: 10/09/17  9:47 AM  Result Value Ref Range   D-Dimer, Quant 1.77 (H) 0.00 - 0.50 ug/mL-FEU    Comment: (NOTE) At the manufacturer cut-off of 0.50 ug/mL  FEU, this assay has been documented to exclude PE with a sensitivity and negative predictive value of 97 to 99%.  At this time, this assay has not been approved by the FDA to exclude DVT/VTE. Results should be correlated with clinical presentation. Performed at [facility], [place] [address] [place], [place] [address]   APTT     Status: None   Collection Time: 10/09/17  9:47 AM  Result Value Ref Range   aPTT 35 24 - 36 seconds    Comment: Performed at [facility], [place] [address], [place], [place] [address]  Protime-INR     Status: None   Collection Time: 10/09/17  9:47 AM  Result Value Ref Range   Prothrombin Time 13.2 11.4 - 15.2 seconds   INR 1.01     Comment: Performed at [facility], [place] [address], [place], [place] [address]  I-stat troponin, ED     Status: None   Collection Time: 10/09/17 11:42 AM  Result Value Ref Range   Troponin i, poc 0.00 0.00 - 0.08 ng/mL   Comment 3            Comment: Due to the release kinetics of cTnI, a negative result within the first hours of the onset of symptoms does not rule out myocardial infarction with certainty. If myocardial infarction is still suspected, repeat the test at appropriate intervals.    Dg Chest 2 View  Result Date: 10/09/2017 CLINICAL DATA:  Left-sided chest pain. EXAM: CHEST - 2 VIEW COMPARISON:  08/29/2017 FINDINGS: Cardiomediastinal silhouette is normal. Mediastinal contours appear intact. There is no evidence of pleural effusion or pneumothorax. Interval development of lingular atelectasis versus peribronchial airspace consolidation with volume loss in the left hemithorax. Osseous structures are without acute abnormality. Soft tissues are grossly normal. IMPRESSION: Interval development of lingular atelectasis versus peribronchial airspace consolidation with volume loss in the left hemithorax. Electronically Signed   By: Fidela Salisbury M.D.   On: 10/09/2017  11:24   Ct Angio Chest Pe W/cm &/or Wo Cm  Result Date: 10/09/2017 CLINICAL DATA:  Acute  left-sided abdominal pain. EXAM: CT ANGIOGRAPHY CHEST CT ABDOMEN AND PELVIS WITH CONTRAST TECHNIQUE: Multidetector CT imaging of the chest was performed using the standard protocol during bolus administration of intravenous contrast. Multiplanar CT image reconstructions and MIPs were obtained to evaluate the vascular anatomy. Multidetector CT imaging of the abdomen and pelvis was performed using the standard protocol during bolus administration of intravenous contrast. CONTRAST:  81m ISOVUE-370 IOPAMIDOL (ISOVUE-370) INJECTION 76% COMPARISON:  CT scans of August 29, 2017 and August 20, 2017. FINDINGS: CTA CHEST FINDINGS Cardiovascular: Filling defect is noted in lower lobe branch of left pulmonary artery consistent with acute pulmonary embolus. RV/LV ratio of 1.0 is noted suggesting right heart strain. There is no evidence of thoracic aortic dissection or aneurysm. No pericardial effusion is noted. Mediastinum/Nodes: No enlarged mediastinal, hilar, or axillary lymph nodes. Thyroid gland, trachea, and esophagus demonstrate no significant findings. Lungs/Pleura: No pneumothorax is noted. Right lung is clear. Left lower lobe atelectasis or infiltrate is noted. Musculoskeletal: No chest wall abnormality. No acute or significant osseous findings. Review of the MIP images confirms the above findings. CT ABDOMEN and PELVIS FINDINGS Hepatobiliary: No gallstones are noted. 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 No biliary dilatation is noted. Pancreas: Unremarkable. No pancreatic ductal dilatation or surrounding inflammatory changes. Spleen: Normal in size without focal abnormality. Adrenals/Urinary Tract: Adrenal glands appear normal. Status post left nephrectomy. Small right renal cyst is noted. No hydronephrosis or renal obstruction is noted. Urinary bladder is unremarkable. Stomach/Bowel:  Stomach is within normal limits. Appendix appears normal. No evidence of bowel wall thickening, distention, or inflammatory changes. Vascular/Lymphatic: Aortic atherosclerosis. No enlarged abdominal or pelvic lymph nodes. Reproductive: Status post hysterectomy. No adnexal masses. Other: No abdominal wall hernia or abnormality. No abdominopelvic ascites. Musculoskeletal: No acute or significant osseous findings. Review of the MIP images confirms the above findings. IMPRESSION: Pulmonary embolus seen in lower lobe branch of left pulmonary artery. Positive for acute PE with CT evidence of right heart strain (RV/LV Ratio = 1.0) consistent with at least submassive (intermediate risk) PE. The presence of right heart strain has been associated with an increased risk of morbidity and mortality. Please activate Code PE by paging [phone] Critical Value/emergent results were called by telephone at the time of interpretation on 10/09/2017 at 1:08 pm to Dr. EQuintella Reichert, who verbally acknowledged these results. Status post left nephrectomy. No acute abnormality seen in the abdomen or pelvis. Aortic Atherosclerosis (ICD10-I70.0). Electronically Signed   By: JMarijo Conception M.D.   On: 10/09/2017 13:10   Ct Abdomen Pelvis W Contrast  Result Date: 10/09/2017 CLINICAL DATA:  Acute left-sided abdominal pain. EXAM: CT ANGIOGRAPHY CHEST CT ABDOMEN AND PELVIS WITH CONTRAST TECHNIQUE: Multidetector CT imaging of the chest was performed using the standard protocol during bolus administration of intravenous contrast. Multiplanar CT image reconstructions and MIPs were obtained to evaluate the vascular anatomy. Multidetector CT imaging of the abdomen and pelvis was performed using the standard protocol during bolus administration of intravenous contrast. CONTRAST:  833mISOVUE-370 IOPAMIDOL (ISOVUE-370) INJECTION 76% COMPARISON:  CT scans of August 29, 2017 and August 20, 2017. FINDINGS: CTA CHEST FINDINGS Cardiovascular: Filling  defect is noted in lower lobe branch of left pulmonary artery consistent with acute pulmonary embolus. RV/LV ratio of 1.0 is noted suggesting right heart strain. There is no evidence of thoracic aortic dissection or aneurysm. No pericardial effusion is noted. Mediastinum/Nodes: No enlarged mediastinal, hilar, or axillary lymph nodes. Thyroid gland, trachea, and  esophagus demonstrate no significant findings. Lungs/Pleura: No pneumothorax is noted. Right lung is clear. Left lower lobe atelectasis or infiltrate is noted. Musculoskeletal: No chest wall abnormality. No acute or significant osseous findings. Review of the MIP images confirms the above findings. CT ABDOMEN and PELVIS FINDINGS Hepatobiliary: No gallstones are noted. 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 No biliary dilatation is noted. Pancreas: Unremarkable. No pancreatic ductal dilatation or surrounding inflammatory changes. Spleen: Normal in size without focal abnormality. Adrenals/Urinary Tract: Adrenal glands appear normal. Status post left nephrectomy. Small right renal cyst is noted. No hydronephrosis or renal obstruction is noted. Urinary bladder is unremarkable. Stomach/Bowel: Stomach is within normal limits. Appendix appears normal. No evidence of bowel wall thickening, distention, or inflammatory changes. Vascular/Lymphatic: Aortic atherosclerosis. No enlarged abdominal or pelvic lymph nodes. Reproductive: Status post hysterectomy. No adnexal masses. Other: No abdominal wall hernia or abnormality. No abdominopelvic ascites. Musculoskeletal: No acute or significant osseous findings. Review of the MIP images confirms the above findings. IMPRESSION: Pulmonary embolus seen in lower lobe branch of left pulmonary artery. Positive for acute PE with CT evidence of right heart strain (RV/LV Ratio = 1.0) consistent with at least submassive (intermediate risk) PE. The presence of right heart strain has been  associated with an increased risk of morbidity and mortality. Please activate Code PE by paging [phone]. Critical Value/emergent results were called by telephone at the time of interpretation on 10/09/2017 at 1:08 pm to Dr. Quintella Reichert , who verbally acknowledged these results. Status post left nephrectomy. No acute abnormality seen in the abdomen or pelvis. Aortic Atherosclerosis (ICD10-I70.0). Electronically Signed   By: Marijo Conception, M.D.   On: 10/09/2017 13:10    Pending Labs Unresulted Labs (From admission, onward)   Start     Ordered   10/10/17 0500  CBC  Daily,   R     10/09/17 1332   10/09/17 2000  Heparin level (unfractionated)  Once-Timed,   R     10/09/17 1345   10/09/17 0947  Urinalysis, Routine w reflex microscopic  STAT,   STAT     10/09/17 0946      Vitals/Pain Today's Vitals   10/09/17 0941 10/09/17 0946 10/09/17 1136 10/09/17 1304  BP: 122/74  138/80 136/78  Pulse: 89  82 89  Resp: 16   18  Temp: 98.9 F (37.2 C)     TempSrc: Oral     SpO2: 100%  97% 97%  Weight: 176 lb (79.8 kg)     Height: 5' 1.5" (1.562 m)     PainSc:  9       Isolation Precautions No active isolations  Medications Medications  iopamidol (ISOVUE-370) 76 % injection (has no administration in time range)  heparin ADULT infusion 100 units/mL (25000 units/22m sodium chloride 0.45%) (1,100 Units/hr Intravenous New Bag/Given 10/09/17 1336)  morphine 4 MG/ML injection 4 mg (has no administration in time range)  sodium chloride 0.9 % bolus 500 mL (0 mLs Intravenous Stopped 10/09/17 1320)  morphine 4 MG/ML injection 4 mg (4 mg Intravenous Given 10/09/17 1132)  ondansetron (ZOFRAN) injection 4 mg (4 mg Intravenous Given 10/09/17 1132)  iopamidol (ISOVUE-370) 76 % injection 100 mL (85 mLs Intravenous Contrast Given 10/09/17 1230)  heparin bolus via infusion 4,000 Units (4,000 Units Intravenous Bolus from Bag 10/09/17 1337)    Mobility walks

## 2017-10-09 NOTE — ED Notes (Signed)
She remains relaxed and tells me she feels "much better". Dr. Ralene Bathe has just informed her that we have found her to have P.E. And plan to hospitalize.

## 2017-10-09 NOTE — H&P (Signed)
History and Physical    Beth [name] WUX:[id number] DOB: 1956/04/21 DOA: 10/09/2017  PCP: [name], MD  Patient coming from: Home  Chief Complaint: LUQ abdominal pain radiating to chest, ornis of breath  HPI: Beth [name] is a 62 y.o. female with medical history significant of depression, anxiety, lupus, recent left nephrectomy who presents with left sided chest pain and LUQ abdominal pain.  She underwent left nephrectomy on 5/6 due to concern for renal cell carcinoma.  Since then she has been recovering at home.  She now returns with left upper quadrant abdominal pain with radiation to her epigastric abdomen as well as chest pain and shortness of breath.  She denies any peripheral edema.  No other fevers, chills.  She does have a dry cough nausea without vomiting.  Denies any diarrhea.  She has been urinating well at home.  She denies any personal or family history of blood clots.   ED Course: CTA chest revealed PE in lower lobe of left pulmonary artery with evidence of right heart strain consistent with submassive PE. She was started on IV heparin.   Review of Systems: As per HPI otherwise 10 point review of systems negative.   Past Medical History:  Diagnosis Date  . Anxiety   . Arthritis    pt denies  . Cystocele   . Depression   . Hypertension    Labile  pt. denies no meds  . Lupus ([place])   . Pre-diabetes   . Prediabetes   . Seizure ([place])   . Sleep apnea   . Vitamin D deficiency     Past Surgical History:  Procedure Laterality Date  . ABDOMINAL HYSTERECTOMY    . CESAREAN SECTION     x 3  . LAPAROSCOPIC NEPHRECTOMY, HAND ASSISTED Left 09/19/2017   Procedure: LEFT HAND ASSISTED LAPAROSCOPIC NEPHRECTOMY;  Surgeon: Lucas Mallow, MD;  Location: [facility];  Service: Urology;  Laterality: Left;  . laprascopic assisted nephrectomy     Left      reports that she has never smoked. She has never used smokeless tobacco. She reports that she does not drink alcohol or  use drugs.  Allergies  Allergen Reactions  . Bactrim [Sulfamethoxazole-Trimethoprim]     GI upset  . Cymbalta [Duloxetine Hcl]     Sweating  . Acyclovir And Related Itching    Family History  Problem Relation Age of Onset  . Hyperlipidemia Mother   . Hypertension Mother   . Heart disease Mother     Prior to Admission medications   Medication Sig Start Date End Date Taking? Authorizing Provider  acetaminophen (TYLENOL) 500 MG tablet Take 1,000 mg by mouth every 6 (six) hours as needed for mild pain.   Yes [provider]  ALPRAZolam (XANAX) 0.5 MG tablet Take 1 tablet (0.5 mg total) by mouth 3 (three) times daily as needed for anxiety. 09/20/17  Yes Vicie Mutters, PA-C  Cholecalciferol (VITAMIN D) 2000 UNITS CAPS Take 1 capsule by mouth daily.   Yes [provider]  escitalopram (LEXAPRO) 20 MG tablet Take 1 tablet (20 mg total) by mouth daily. 08/01/17 08/01/18 Yes Vicie Mutters, PA-C  hydroxychloroquine (PLAQUENIL) 200 MG tablet Take 2 tablets (400 mg total) daily by mouth. 03/23/17  Yes [name], MD  linaclotide [facility]) 290 MCG CAPS capsule Take 290 mcg by mouth daily before breakfast.   Yes [provider]  Magnesium 500 MG TABS Take 500 mg by mouth daily.    Yes [provider]  meloxicam (MOBIC) 15 MG tablet TAKE 1 TAB DAILY W/FOOD FOR 2 WEEKS, THEN AS NEEDED, CAN TAKE W/TYLENOL, DON'T TAKE W/ALEVE OR IBU Patient not taking: Reported on 10/09/2017 10/03/17   [name], NP    Physical Exam: Vitals:   10/09/17 1200 10/09/17 1304 10/09/17 1315 10/09/17 1320  BP: (!) 141/78 136/78    Pulse:  89 89 88  Resp:  18 (!) 27 (!) 30  Temp:      TempSrc:      SpO2:  97% 100% 98%  Weight:      Height:        Constitutional: NAD, calm, comfortable Eyes: PERRL, lids and conjunctivae normal ENMT: Mucous membranes are moist. Posterior pharynx clear of any exudate or lesions.Normal dentition.  Neck: normal, supple, no masses, no  thyromegaly Respiratory: clear to auscultation bilaterally, no wheezing, no crackles. Normal respiratory effort. No accessory muscle use.  Cardiovascular: Regular rate and rhythm, no murmurs / rubs / gallops. No extremity edema.  Abdomen: LUQ abdominal tenderness, no masses palpated. No hepatosplenomegaly. Bowel sounds positive. +laproscopic incisions are clean and dry without drainage  Musculoskeletal: no clubbing / cyanosis. No joint deformity upper and lower extremities. Good ROM, no contractures. Normal muscle tone.  Skin: no rashes, lesions, ulcers. No induration Neurologic: CN 2-12 grossly intact. Sensation intact, DTR normal. Strength 5/5 in all 4.  Psychiatric: Normal judgment and insight. Alert and oriented x 3. Normal mood.   Labs on Admission: I have personally reviewed following labs and imaging studies  CBC: Recent Labs  Lab 10/09/17 0947  WBC 7.7  HGB 12.0  HCT 36.9  MCV 91.6  PLT 643   Basic Metabolic Panel: Recent Labs  Lab 10/09/17 0947  NA 139  K 4.5  CL 107  CO2 24  GLUCOSE 118*  BUN 13  CREATININE 1.14*  CALCIUM 9.0   GFR: Estimated Creatinine Clearance: 49.5 mL/min (A) (by C-G formula based on SCr of 1.14 mg/dL (H)). Liver Function Tests: Recent Labs  Lab 10/09/17 0947  AST 19  ALT 16  ALKPHOS 52  BILITOT 0.4  PROT 7.7  ALBUMIN 3.4*   Recent Labs  Lab 10/09/17 0947  LIPASE 37   No results for input(s): AMMONIA in the last 168 hours. Coagulation Profile: Recent Labs  Lab 10/09/17 0947  INR 1.01   Cardiac Enzymes: No results for input(s): CKTOTAL, CKMB, CKMBINDEX, TROPONINI in the last 168 hours. BNP (last 3 results) No results for input(s): PROBNP in the last 8760 hours. HbA1C: No results for input(s): HGBA1C in the last 72 hours. CBG: No results for input(s): GLUCAP in the last 168 hours. Lipid Profile: No results for input(s): CHOL, HDL, LDLCALC, TRIG, CHOLHDL, LDLDIRECT in the last 72 hours. Thyroid Function Tests: No  results for input(s): TSH, T4TOTAL, FREET4, T3FREE, THYROIDAB in the last 72 hours. Anemia Panel: No results for input(s): VITAMINB12, FOLATE, FERRITIN, TIBC, IRON, RETICCTPCT in the last 72 hours. Urine analysis:    Component Value Date/Time   COLORURINE YELLOW 08/20/2017 1426   APPEARANCEUR CLOUDY (A) 08/20/2017 1426   LABSPEC 1.020 08/20/2017 1426   PHURINE 6.5 08/20/2017 1426   GLUCOSEU NEGATIVE 08/20/2017 1426   HGBUR NEGATIVE 08/20/2017 1426   BILIRUBINUR NEGATIVE 08/20/2017 1426   KETONESUR NEGATIVE 08/20/2017 1426   PROTEINUR NEGATIVE 08/20/2017 1426   UROBILINOGEN 1 11/21/2014 1046   NITRITE NEGATIVE 08/20/2017 1426   LEUKOCYTESUR SMALL (A) 08/20/2017 1426   Sepsis Labs: !!!!!!!!!!!!!!!!!!!!!!!!!!!!!!!!!!!!!!!!!!!! @LABRCNTIP (procalcitonin:4,lacticidven:4) )No results found for this or any  previous visit (from the past 240 hour(s)).   Radiological Exams on Admission: Dg Chest 2 View  Result Date: 10/09/2017 CLINICAL DATA:  Left-sided chest pain. EXAM: CHEST - 2 VIEW COMPARISON:  08/29/2017 FINDINGS: Cardiomediastinal silhouette is normal. Mediastinal contours appear intact. There is no evidence of pleural effusion or pneumothorax. Interval development of lingular atelectasis versus peribronchial airspace consolidation with volume loss in the left hemithorax. Osseous structures are without acute abnormality. Soft tissues are grossly normal. IMPRESSION: Interval development of lingular atelectasis versus peribronchial airspace consolidation with volume loss in the left hemithorax. Electronically Signed   By: Fidela Salisbury M.D.   On: 10/09/2017 11:24   Ct Angio Chest Pe W/cm &/or Wo Cm  Result Date: 10/09/2017 CLINICAL DATA:  Acute left-sided abdominal pain. EXAM: CT ANGIOGRAPHY CHEST CT ABDOMEN AND PELVIS WITH CONTRAST TECHNIQUE: Multidetector CT imaging of the chest was performed using the standard protocol during bolus administration of intravenous contrast. Multiplanar  CT image reconstructions and MIPs were obtained to evaluate the vascular anatomy. Multidetector CT imaging of the abdomen and pelvis was performed using the standard protocol during bolus administration of intravenous contrast. CONTRAST:  57mL ISOVUE-370 IOPAMIDOL (ISOVUE-370) INJECTION 76% COMPARISON:  CT scans of August 29, 2017 and August 20, 2017. FINDINGS: CTA CHEST FINDINGS Cardiovascular: Filling defect is noted in lower lobe branch of left pulmonary artery consistent with acute pulmonary embolus. RV/LV ratio of 1.0 is noted suggesting right heart strain. There is no evidence of thoracic aortic dissection or aneurysm. No pericardial effusion is noted. Mediastinum/Nodes: No enlarged mediastinal, hilar, or axillary lymph nodes. Thyroid gland, trachea, and esophagus demonstrate no significant findings. Lungs/Pleura: No pneumothorax is noted. Right lung is clear. Left lower lobe atelectasis or infiltrate is noted. Musculoskeletal: No chest wall abnormality. No acute or significant osseous findings. Review of the MIP images confirms the above findings. CT ABDOMEN and PELVIS FINDINGS Hepatobiliary: No gallstones are noted. 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 No biliary dilatation is noted. Pancreas: Unremarkable. No pancreatic ductal dilatation or surrounding inflammatory changes. Spleen: Normal in size without focal abnormality. Adrenals/Urinary Tract: Adrenal glands appear normal. Status post left nephrectomy. Small right renal cyst is noted. No hydronephrosis or renal obstruction is noted. Urinary bladder is unremarkable. Stomach/Bowel: Stomach is within normal limits. Appendix appears normal. No evidence of bowel wall thickening, distention, or inflammatory changes. Vascular/Lymphatic: Aortic atherosclerosis. No enlarged abdominal or pelvic lymph nodes. Reproductive: Status post hysterectomy. No adnexal masses. Other: No abdominal wall hernia or abnormality. No  abdominopelvic ascites. Musculoskeletal: No acute or significant osseous findings. Review of the MIP images confirms the above findings. IMPRESSION: Pulmonary embolus seen in lower lobe branch of left pulmonary artery. Positive for acute PE with CT evidence of right heart strain (RV/LV Ratio = 1.0) consistent with at least submassive (intermediate risk) PE. The presence of right heart strain has been associated with an increased risk of morbidity and mortality. Please activate Code PE by paging [phone]. Critical Value/emergent results were called by telephone at the time of interpretation on 10/09/2017 at 1:08 pm to Dr. Quintella Reichert , who verbally acknowledged these results. Status post left nephrectomy. No acute abnormality seen in the abdomen or pelvis. Aortic Atherosclerosis (ICD10-I70.0). Electronically Signed   By: Marijo Conception, M.D.   On: 10/09/2017 13:10   Ct Abdomen Pelvis W Contrast  Result Date: 10/09/2017 CLINICAL DATA:  Acute left-sided abdominal pain. EXAM: CT ANGIOGRAPHY CHEST CT ABDOMEN AND PELVIS WITH CONTRAST TECHNIQUE: Multidetector CT  imaging of the chest was performed using the standard protocol during bolus administration of intravenous contrast. Multiplanar CT image reconstructions and MIPs were obtained to evaluate the vascular anatomy. Multidetector CT imaging of the abdomen and pelvis was performed using the standard protocol during bolus administration of intravenous contrast. CONTRAST:  39mL ISOVUE-370 IOPAMIDOL (ISOVUE-370) INJECTION 76% COMPARISON:  CT scans of August 29, 2017 and August 20, 2017. FINDINGS: CTA CHEST FINDINGS Cardiovascular: Filling defect is noted in lower lobe branch of left pulmonary artery consistent with acute pulmonary embolus. RV/LV ratio of 1.0 is noted suggesting right heart strain. There is no evidence of thoracic aortic dissection or aneurysm. No pericardial effusion is noted. Mediastinum/Nodes: No enlarged mediastinal, hilar, or axillary lymph  nodes. Thyroid gland, trachea, and esophagus demonstrate no significant findings. Lungs/Pleura: No pneumothorax is noted. Right lung is clear. Left lower lobe atelectasis or infiltrate is noted. Musculoskeletal: No chest wall abnormality. No acute or significant osseous findings. Review of the MIP images confirms the above findings. CT ABDOMEN and PELVIS FINDINGS Hepatobiliary: No gallstones are noted. 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 No biliary dilatation is noted. Pancreas: Unremarkable. No pancreatic ductal dilatation or surrounding inflammatory changes. Spleen: Normal in size without focal abnormality. Adrenals/Urinary Tract: Adrenal glands appear normal. Status post left nephrectomy. Small right renal cyst is noted. No hydronephrosis or renal obstruction is noted. Urinary bladder is unremarkable. Stomach/Bowel: Stomach is within normal limits. Appendix appears normal. No evidence of bowel wall thickening, distention, or inflammatory changes. Vascular/Lymphatic: Aortic atherosclerosis. No enlarged abdominal or pelvic lymph nodes. Reproductive: Status post hysterectomy. No adnexal masses. Other: No abdominal wall hernia or abnormality. No abdominopelvic ascites. Musculoskeletal: No acute or significant osseous findings. Review of the MIP images confirms the above findings. IMPRESSION: Pulmonary embolus seen in lower lobe branch of left pulmonary artery. Positive for acute PE with CT evidence of right heart strain (RV/LV Ratio = 1.0) consistent with at least submassive (intermediate risk) PE. The presence of right heart strain has been associated with an increased risk of morbidity and mortality. Please activate Code PE by paging [phone]. Critical Value/emergent results were called by telephone at the time of interpretation on 10/09/2017 at 1:08 pm to Dr. Quintella Reichert , who verbally acknowledged these results. Status post left nephrectomy. No acute  abnormality seen in the abdomen or pelvis. Aortic Atherosclerosis (ICD10-I70.0). Electronically Signed   By: Marijo Conception, M.D.   On: 10/09/2017 13:10    EKG: Independently reviewed. Normal sinus rhythm   Assessment/Plan Principal Problem:   Pulmonary embolism (HCC) Active Problems:   SLE   Renal cell carcinoma (HCC)   H/O left nephrectomy   Depression   Anxiety  Acute PE  -CTA chest showed left pulmonary artery with right heart strain  -Check venous duplex, echo -IV heparin -CM consult for xarelto or eliquis cost on discharge   CKD stage 3 -Baseline Cr 0.8-0.9 prior to nephrectomy. Stable Cr since discharge on 09/20/17 -Monitor   Lupus -Continue plaquenil   Renal cell carcinoma  -S/p left nephrectomy 09/19/17 by [name]   Depression/anxiety -Continue lexapro, xanax    DVT prophylaxis: IV heparin Code Status: Full  Family Communication: No family at bedside Disposition Plan: Likely home next 24 hours as long as she remains hemodynamically stable  Consults called: None   Admission status: Observation   Severity of Illness: The appropriate patient status for this patient is OBSERVATION. Observation status is judged to be reasonable and necessary  in order to provide the required intensity of service to ensure the patient's safety. The patient's presenting symptoms, physical exam findings, and initial radiographic and laboratory data in the context of their medical condition is felt to place them at decreased risk for further clinical deterioration. Furthermore, it is anticipated that the patient will be medically stable for discharge from the hospital within 2 midnights of admission.    Dessa Phi, DO [facility] [email or web address] Password [facility] 10/09/2017, 2:31 PM

## 2017-10-09 NOTE — ED Provider Notes (Signed)
[facility] DEPT Provider Note   CSN: [id number] Arrival date & time: 10/09/17  0240     History   Chief Complaint Chief Complaint  Patient presents with  . Abdominal Pain  . Nausea    HPI Beth [name] is a 62 y.o. female.  The history is provided by the patient. No language interpreter was used.  Abdominal Pain     Beth [name] is a 62 y.o. female who presents to the Emergency Department complaining of flank pain/chest pain. Yesterday morning she awoke with severe, left sided chest and abdominal pain. Pain is worse with movement, deep breaths and lifting her left arm. She has mild associated shortness of breath and cough. She denies any fevers, vomiting, leg swelling or pain. She does have mild dysuria that has been present for the last 20 days. 20 days ago she had a left nephrectomy for renal cell carcinoma. Following the procedure her pain had resolved and she had been pain free for almost a week. Past Medical History:  Diagnosis Date  . Anxiety   . Arthritis    pt denies  . Cystocele   . Depression   . Hypertension    Labile  pt. denies no meds  . Lupus ([place])   . Pre-diabetes   . Prediabetes   . Seizure ([place])   . Sleep apnea   . Vitamin D deficiency     Patient Active Problem List   Diagnosis Date Noted  . Pulmonary embolism ([place]) 10/09/2017  . Renal cell carcinoma ([place]) 10/09/2017  . H/O left nephrectomy 10/09/2017  . Depression 10/09/2017  . Anxiety 10/09/2017  . Kidney mass 09/19/2017  . OSA (obstructive sleep apnea) 01/24/2015  . RLS (restless legs syndrome) 01/24/2015  . Obesity 05/28/2014  . Stress incontinence 05/28/2014  . Medication management 05/28/2014  . Hypertension   . Prediabetes   . Hyperlipidemia   . Vitamin D deficiency   . 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 Environmental allergies 07/14/2006  . Constipation 07/14/2006  . SLE 07/14/2006    Past Surgical History:  Procedure  Laterality Date  . ABDOMINAL HYSTERECTOMY    . CESAREAN SECTION     x 3  . LAPAROSCOPIC NEPHRECTOMY, HAND ASSISTED Left 09/19/2017   Procedure: LEFT HAND ASSISTED LAPAROSCOPIC NEPHRECTOMY;  Surgeon: Lucas Mallow, MD;  Location: [facility];  Service: Urology;  Laterality: Left;  . laprascopic assisted nephrectomy     Left      OB History   None      Home Medications    Prior to Admission medications   Medication Sig Start Date End Date Taking? Authorizing Provider  acetaminophen (TYLENOL) 500 MG tablet Take 1,000 mg by mouth every 6 (six) hours as needed for mild pain.   Yes [provider]  ALPRAZolam (XANAX) 0.5 MG tablet Take 1 tablet (0.5 mg total) by mouth 3 (three) times daily as needed for anxiety. 09/20/17  Yes Vicie Mutters, PA-C  Cholecalciferol (VITAMIN D) 2000 UNITS CAPS Take 1 capsule by mouth daily.   Yes [provider]  escitalopram (LEXAPRO) 20 MG tablet Take 1 tablet (20 mg total) by mouth daily. 08/01/17 08/01/18 Yes Vicie Mutters, PA-C  hydroxychloroquine (PLAQUENIL) 200 MG tablet Take 2 tablets (400 mg total) daily by mouth. 03/23/17  Yes [name], MD  linaclotide [facility]) 290 MCG CAPS capsule Take 290 mcg by mouth daily before breakfast.   Yes [provider]  Magnesium 500 MG TABS Take 500  mg by mouth daily.    Yes [provider]  meloxicam (MOBIC) 15 MG tablet TAKE 1 TAB DAILY W/FOOD FOR 2 WEEKS, THEN AS NEEDED, CAN TAKE W/TYLENOL, DON'T TAKE W/ALEVE OR IBU Patient not taking: Reported on 10/09/2017 10/03/17   [name], NP    Family History Family History  Problem Relation Age of Onset  . Hyperlipidemia Mother   . Hypertension Mother   . Heart disease Mother     Social History Social History   Tobacco Use  . Smoking status: Never Smoker  . Smokeless tobacco: Never Used  Substance Use Topics  . Alcohol use: No  . Drug use: No     Allergies   Bactrim [sulfamethoxazole-trimethoprim]; Cymbalta  [duloxetine hcl]; and Acyclovir and related   Review of Systems Review of Systems  Gastrointestinal: Positive for abdominal pain.  All other systems reviewed and are negative.    Physical Exam Updated Vital Signs BP (!) 148/75 (BP Location: Left Arm)   Pulse 94   Temp 98.8 F (37.1 C) (Oral)   Resp 20   Ht 5' 1.5" (1.562 m)   Wt 79.8 kg (176 lb)   SpO2 92%   BMI 32.72 kg/m   Physical Exam  Constitutional: She is oriented to person, place, and time. She appears well-developed and well-nourished.  HENT:  Head: Normocephalic and atraumatic.  Cardiovascular: Normal rate and regular rhythm.  No murmur heard. Pulmonary/Chest: Effort normal and breath sounds normal. No respiratory distress. She exhibits no tenderness.  Abdominal: Soft.  Steri-Strips to abdominal incisional sites. Sites are healing well with no erythema or edema. There is mild left upper quadrant tenderness  Musculoskeletal: She exhibits no edema or tenderness.  Neurological: She is alert and oriented to person, place, and time.  Skin: Skin is warm and dry.  Psychiatric: She has a normal mood and affect. Her behavior is normal.  Nursing note and vitals reviewed.    ED Treatments / Results  Labs (all labs ordered are listed, but only abnormal results are displayed) Labs Reviewed  COMPREHENSIVE METABOLIC PANEL - Abnormal; Notable for the following components:      Result Value   Glucose, Bld 118 (*)    Creatinine, Ser 1.14 (*)    Albumin 3.4 (*)    GFR calc non Af Amer 50 (*)    GFR calc Af Amer 58 (*)    All other components within normal limits  D-DIMER, QUANTITATIVE (NOT AT [facility]) - Abnormal; Notable for the following components:   D-Dimer, Quant 1.77 (*)    All other components within normal limits  LIPASE, BLOOD  CBC  URINALYSIS, ROUTINE W REFLEX MICROSCOPIC  APTT  PROTIME-INR  HEPARIN LEVEL (UNFRACTIONATED)  I-STAT TROPONIN, ED    EKG EKG Interpretation  Date/Time:  Sunday Oct 09 2017  13:16:48 EDT Ventricular Rate:  87 PR Interval:    QRS Duration: 93 QT Interval:  405 QTC Calculation: 488 R Axis:   14 Text Interpretation:  Sinus rhythm Borderline abnrm T, anterolateral leads Borderline prolonged QT interval Baseline wander in lead(s) V6 no prior available for comparison Confirmed by Quintella Reichert [phone]) on 10/09/2017 1:33:02 PM Also confirmed by Quintella Reichert [phone]), editor Philomena Doheny [phone])  on 10/09/2017 3:52:06 PM   Radiology Dg Chest 2 View  Result Date: 10/09/2017 CLINICAL DATA:  Left-sided chest pain. EXAM: CHEST - 2 VIEW COMPARISON:  08/29/2017 FINDINGS: Cardiomediastinal silhouette is normal. Mediastinal contours appear intact. There is no evidence of pleural effusion or pneumothorax. Interval development  of lingular atelectasis versus peribronchial airspace consolidation with volume loss in the left hemithorax. Osseous structures are without acute abnormality. Soft tissues are grossly normal. IMPRESSION: Interval development of lingular atelectasis versus peribronchial airspace consolidation with volume loss in the left hemithorax. Electronically Signed   By: Fidela Salisbury M.D.   On: 10/09/2017 11:24   Ct Angio Chest Pe W/cm &/or Wo Cm  Result Date: 10/09/2017 CLINICAL DATA:  Acute left-sided abdominal pain. EXAM: CT ANGIOGRAPHY CHEST CT ABDOMEN AND PELVIS WITH CONTRAST TECHNIQUE: Multidetector CT imaging of the chest was performed using the standard protocol during bolus administration of intravenous contrast. Multiplanar CT image reconstructions and MIPs were obtained to evaluate the vascular anatomy. Multidetector CT imaging of the abdomen and pelvis was performed using the standard protocol during bolus administration of intravenous contrast. CONTRAST:  25mL ISOVUE-370 IOPAMIDOL (ISOVUE-370) INJECTION 76% COMPARISON:  CT scans of August 29, 2017 and August 20, 2017. FINDINGS: CTA CHEST FINDINGS Cardiovascular: Filling defect is noted in lower lobe branch  of left pulmonary artery consistent with acute pulmonary embolus. RV/LV ratio of 1.0 is noted suggesting right heart strain. There is no evidence of thoracic aortic dissection or aneurysm. No pericardial effusion is noted. Mediastinum/Nodes: No enlarged mediastinal, hilar, or axillary lymph nodes. Thyroid gland, trachea, and esophagus demonstrate no significant findings. Lungs/Pleura: No pneumothorax is noted. Right lung is clear. Left lower lobe atelectasis or infiltrate is noted. Musculoskeletal: No chest wall abnormality. No acute or significant osseous findings. Review of the MIP images confirms the above findings. CT ABDOMEN and PELVIS FINDINGS Hepatobiliary: No gallstones are noted. 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 No biliary dilatation is noted. Pancreas: Unremarkable. No pancreatic ductal dilatation or surrounding inflammatory changes. Spleen: Normal in size without focal abnormality. Adrenals/Urinary Tract: Adrenal glands appear normal. Status post left nephrectomy. Small right renal cyst is noted. No hydronephrosis or renal obstruction is noted. Urinary bladder is unremarkable. Stomach/Bowel: Stomach is within normal limits. Appendix appears normal. No evidence of bowel wall thickening, distention, or inflammatory changes. Vascular/Lymphatic: Aortic atherosclerosis. No enlarged abdominal or pelvic lymph nodes. Reproductive: Status post hysterectomy. No adnexal masses. Other: No abdominal wall hernia or abnormality. No abdominopelvic ascites. Musculoskeletal: No acute or significant osseous findings. Review of the MIP images confirms the above findings. IMPRESSION: Pulmonary embolus seen in lower lobe branch of left pulmonary artery. Positive for acute PE with CT evidence of right heart strain (RV/LV Ratio = 1.0) consistent with at least submassive (intermediate risk) PE. The presence of right heart strain has been associated with an increased risk of  morbidity and mortality. Please activate Code PE by paging [phone]. Critical Value/emergent results were called by telephone at the time of interpretation on 10/09/2017 at 1:08 pm to Dr. Quintella Reichert , who verbally acknowledged these results. Status post left nephrectomy. No acute abnormality seen in the abdomen or pelvis. Aortic Atherosclerosis (ICD10-I70.0). Electronically Signed   By: Marijo Conception, M.D.   On: 10/09/2017 13:10   Ct Abdomen Pelvis W Contrast  Result Date: 10/09/2017 CLINICAL DATA:  Acute left-sided abdominal pain. EXAM: CT ANGIOGRAPHY CHEST CT ABDOMEN AND PELVIS WITH CONTRAST TECHNIQUE: Multidetector CT imaging of the chest was performed using the standard protocol during bolus administration of intravenous contrast. Multiplanar CT image reconstructions and MIPs were obtained to evaluate the vascular anatomy. Multidetector CT imaging of the abdomen and pelvis was performed using the standard protocol during bolus administration of intravenous contrast. CONTRAST:  45mL ISOVUE-370 IOPAMIDOL (ISOVUE-370)  INJECTION 76% COMPARISON:  CT scans of August 29, 2017 and August 20, 2017. FINDINGS: CTA CHEST FINDINGS Cardiovascular: Filling defect is noted in lower lobe branch of left pulmonary artery consistent with acute pulmonary embolus. RV/LV ratio of 1.0 is noted suggesting right heart strain. There is no evidence of thoracic aortic dissection or aneurysm. No pericardial effusion is noted. Mediastinum/Nodes: No enlarged mediastinal, hilar, or axillary lymph nodes. Thyroid gland, trachea, and esophagus demonstrate no significant findings. Lungs/Pleura: No pneumothorax is noted. Right lung is clear. Left lower lobe atelectasis or infiltrate is noted. Musculoskeletal: No chest wall abnormality. No acute or significant osseous findings. Review of the MIP images confirms the above findings. CT ABDOMEN and PELVIS FINDINGS Hepatobiliary: No gallstones are noted. 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 No biliary dilatation is noted. Pancreas: Unremarkable. No pancreatic ductal dilatation or surrounding inflammatory changes. Spleen: Normal in size without focal abnormality. Adrenals/Urinary Tract: Adrenal glands appear normal. Status post left nephrectomy. Small right renal cyst is noted. No hydronephrosis or renal obstruction is noted. Urinary bladder is unremarkable. Stomach/Bowel: Stomach is within normal limits. Appendix appears normal. No evidence of bowel wall thickening, distention, or inflammatory changes. Vascular/Lymphatic: Aortic atherosclerosis. No enlarged abdominal or pelvic lymph nodes. Reproductive: Status post hysterectomy. No adnexal masses. Other: No abdominal wall hernia or abnormality. No abdominopelvic ascites. Musculoskeletal: No acute or significant osseous findings. Review of the MIP images confirms the above findings. IMPRESSION: Pulmonary embolus seen in lower lobe branch of left pulmonary artery. Positive for acute PE with CT evidence of right heart strain (RV/LV Ratio = 1.0) consistent with at least submassive (intermediate risk) PE. The presence of right heart strain has been associated with an increased risk of morbidity and mortality. Please activate Code PE by paging [phone]. Critical Value/emergent results were called by telephone at the time of interpretation on 10/09/2017 at 1:08 pm to Dr. Quintella Reichert , who verbally acknowledged these results. Status post left nephrectomy. No acute abnormality seen in the abdomen or pelvis. Aortic Atherosclerosis (ICD10-I70.0). Electronically Signed   By: Marijo Conception, M.D.   On: 10/09/2017 13:10    Procedures Procedures (including critical care time) CRITICAL CARE Performed by: Quintella Reichert   Total critical care time: 35 minutes  Critical care time was exclusive of separately billable procedures and treating other patients.  Critical care was necessary to treat or prevent  imminent or life-threatening deterioration.  Critical care was time spent personally by me on the following activities: development of treatment plan with patient and/or surrogate as well as nursing, discussions with consultants, evaluation of patient's response to treatment, examination of patient, obtaining history from patient or surrogate, ordering and performing treatments and interventions, ordering and review of laboratory studies, ordering and review of radiographic studies, pulse oximetry and re-evaluation of patient's condition.  Medications Ordered in ED Medications  iopamidol (ISOVUE-370) 76 % injection (  Canceled Entry 10/09/17 1427)  heparin ADULT infusion 100 units/mL (25000 units/233mL sodium chloride 0.45%) (1,100 Units/hr Intravenous Transfusing/Transfer 10/09/17 1358)  ALPRAZolam (XANAX) tablet 0.5 mg (has no administration in time range)  escitalopram (LEXAPRO) tablet 20 mg (has no administration in time range)  hydroxychloroquine (PLAQUENIL) tablet 400 mg (400 mg Oral Given 10/09/17 1610)  linaclotide (LINZESS) capsule 290 mcg (has no administration in time range)  acetaminophen (TYLENOL) tablet 650 mg (has no administration in time range)    Or  acetaminophen (TYLENOL) suppository 650 mg (has no administration in time range)  senna-docusate (  Senokot-S) tablet 1 tablet (has no administration in time range)  ondansetron (ZOFRAN) tablet 4 mg (has no administration in time range)    Or  ondansetron (ZOFRAN) injection 4 mg (has no administration in time range)  sodium chloride 0.9 % bolus 500 mL (0 mLs Intravenous Stopped 10/09/17 1320)  morphine 4 MG/ML injection 4 mg (4 mg Intravenous Given 10/09/17 1132)  ondansetron (ZOFRAN) injection 4 mg (4 mg Intravenous Given 10/09/17 1132)  iopamidol (ISOVUE-370) 76 % injection 100 mL (85 mLs Intravenous Contrast Given 10/09/17 1230)  heparin bolus via infusion 4,000 Units (4,000 Units Intravenous Bolus from Bag 10/09/17 1337)  morphine 4  MG/ML injection 4 mg (4 mg Intravenous Given 10/09/17 1359)     Initial Impression / Assessment and Plan / ED Course  I have reviewed the triage vital signs and the nursing notes.  Pertinent labs & imaging results that were available during my care of the patient were reviewed by me and considered in my medical decision making (see chart for details).     Patient here for evaluation of left sided chest and abdominal pain, 20 days postop following nephrectomy. She does have significant pain on examination, which limits movement. CT scan demonstrates acute pulmonary embolus with findings concerning for right heart strain. Will initiate heparin drip for large PE. No evidence of hemorrhage related to recent surgery. Patient updated findings of studies recommendation for admission and she is in agreement with plan. Hospitalist consulted for admission.  Final Clinical Impressions(s) / ED Diagnoses   Final diagnoses:  Acute pulmonary embolism without acute cor pulmonale, unspecified pulmonary embolism type [facility])    ED Discharge Orders    None       Quintella Reichert, MD 10/09/17 [phone]

## 2017-10-09 NOTE — Progress Notes (Signed)
Patient rated sharp left flank pain @ 8/10. PCP on call was notified

## 2017-10-10 ENCOUNTER — Observation Stay (HOSPITAL_BASED_OUTPATIENT_CLINIC_OR_DEPARTMENT_OTHER): Payer: BC Managed Care – PPO

## 2017-10-10 DIAGNOSIS — I2699 Other pulmonary embolism without acute cor pulmonale: Secondary | ICD-10-CM | POA: Diagnosis not present

## 2017-10-10 DIAGNOSIS — R079 Chest pain, unspecified: Secondary | ICD-10-CM | POA: Diagnosis not present

## 2017-10-10 LAB — ECHOCARDIOGRAM COMPLETE
Height: 61.5 in
Weight: 2816 oz

## 2017-10-10 LAB — BASIC METABOLIC PANEL
Anion gap: 10 (ref 5–15)
BUN: 11 mg/dL (ref 6–20)
CO2: 24 mmol/L (ref 22–32)
Calcium: 8.8 mg/dL — ABNORMAL LOW (ref 8.9–10.3)
Chloride: 102 mmol/L (ref 101–111)
Creatinine, Ser: 1.22 mg/dL — ABNORMAL HIGH (ref 0.44–1.00)
GFR calc Af Amer: 54 mL/min — ABNORMAL LOW (ref >60)
GFR calc non Af Amer: 46 mL/min — ABNORMAL LOW (ref >60)
Glucose, Bld: 86 mg/dL (ref 65–99)
Potassium: 3.9 mmol/L (ref 3.5–5.1)
Sodium: 136 mmol/L (ref 135–145)

## 2017-10-10 LAB — CBC
HCT: 35.8 % — ABNORMAL LOW (ref 36.0–46.0)
Hemoglobin: 11.4 g/dL — ABNORMAL LOW (ref 12.0–15.0)
MCH: 28.9 pg (ref 26.0–34.0)
MCHC: 31.8 g/dL (ref 30.0–36.0)
MCV: 90.6 fL (ref 78.0–100.0)
Platelets: 268 10*3/uL (ref 150–400)
RBC: 3.95 MIL/uL (ref 3.87–5.11)
RDW: 14 % (ref 11.5–15.5)
WBC: 7.4 10*3/uL (ref 4.0–10.5)

## 2017-10-10 LAB — HEPARIN LEVEL (UNFRACTIONATED): Heparin Unfractionated: 0.85 IU/mL — ABNORMAL HIGH (ref 0.30–0.70)

## 2017-10-10 MED ORDER — APIXABAN 5 MG PO TABS
5.0000 mg | ORAL_TABLET | Freq: Two times a day (BID) | ORAL | Status: DC
Start: 2017-10-17 — End: 2017-10-11

## 2017-10-10 MED ORDER — APIXABAN 5 MG PO TABS
10.0000 mg | ORAL_TABLET | Freq: Two times a day (BID) | ORAL | Status: DC
  Administered 2017-10-10 – 2017-10-11 (×3): 10 mg via ORAL
  Filled 2017-10-10 (×3): qty 2

## 2017-10-10 MED ORDER — HEPARIN (PORCINE) IN NACL 100-0.45 UNIT/ML-% IJ SOLN: 950.0000 [IU]/h | INTRAMUSCULAR | Status: DC

## 2017-10-10 MED ORDER — SODIUM CHLORIDE 0.9 % IV SOLN
INTRAVENOUS | Status: DC
  Administered 2017-10-10 – 2017-10-11 (×2): via INTRAVENOUS

## 2017-10-10 NOTE — Progress Notes (Signed)
Adrian for IV heparin transitioned to Apixaban Indication: pulmonary embolus  Allergies  Allergen Reactions  . Bactrim [Sulfamethoxazole-Trimethoprim]     GI upset  . Cymbalta [Duloxetine Hcl]     Sweating  . Acyclovir And Related Itching   Patient Measurements: Height: 5' 1.5" (156.2 cm) Weight: 176 lb (79.8 kg) IBW/kg (Calculated) : 48.95 Heparin Dosing Weight: 58 kg  Vital Signs: Temp: 98.9 F (37.2 C) (05/27 0418) Temp Source: Oral (05/27 0418) BP: 119/69 (05/27 0418) Pulse Rate: 79 (05/27 0418)  Labs: Recent Labs    10/09/17 0947 10/09/17 1932 10/10/17 0519 10/10/17 0816  HGB 12.0  --  11.4*  --   HCT 36.9  --  35.8*  --   PLT 227  --  268  --   APTT 35  --   --   --   LABPROT 13.2  --   --   --   INR 1.01  --   --   --   HEPARINUNFRC  --  0.81*  --  0.85*  CREATININE 1.14*  --  1.22*  --    Estimated Creatinine Clearance: 46.3 mL/min (A) (by C-G formula based on SCr of 1.22 mg/dL (H)).  Medical History: Past Medical History:  Diagnosis Date  . Anxiety   . Arthritis    pt denies  . Cystocele   . Depression   . Hypertension    Labile  pt. denies no meds  . Lupus ([place])   . Pre-diabetes   . Prediabetes   . Seizure ([place])   . Sleep apnea   . Vitamin D deficiency     Medications:  Scheduled:  . escitalopram  20 mg Oral Daily  . hydroxychloroquine  400 mg Oral Daily  . linaclotide  290 mcg Oral QAC breakfast   Infusions:  . sodium chloride      Assessment: 23 yoF c/o left side pain.  CT + LLL PE with right heart strain. IV heparin for PE.  Hx Lupus, HTN, OSA. Laparoscopic nephrectomy 5/6 for renal Ca, discharge same day Heparin 4000 unit bolus x1, drip at 1100 units/hr  Today, 10/10/2017  1st Heparin level last night was 0.81 units/ml, Heparin rate not changed 2nd Heparin level 0.85 units/ml on 1100 units/hr Planned change to Apixaban, begin 5/27  Goal of Therapy:  Heparin level 0.3-0.7  units/ml Monitor platelets by anticoagulation protocol: Yes   Plan:   Discontinue Heparin at time of first dose Apixaban  Apixaban 10mg  bid x 7 days, then decrease to 5mg  bid  Will educate patient  Minda Ditto PharmD Pager [phone] 10/10/2017, 12:38 PM

## 2017-10-10 NOTE — Progress Notes (Signed)
PROGRESS NOTE    Beth [name]  VZD:[id number] DOB: 03-30-1956 DOA: 10/09/2017 PCP: [name], MD   Brief Narrative: This 62 year old female with past medical history of depression, anxiety, lupus, recent left nephrectomy who presented to the emergency department with complaints of left-sided chest pain and left upper quadrant abdominal pain.  Patient reported that she underwent left nephrectomy on 5/6 due to concern for renal cell carcinoma.  Patient was found to have PE in the lower lobe of left pulmonary artery with evidence of right ventricular strain consistent with submassive PE.  Started on heparin drip.  This morning she was hemodynamically stable.  IV heparin changed to Eliquis.  Assessment & Plan:   Principal Problem:   Pulmonary embolism (HCC) Active Problems:   SLE   Renal cell carcinoma (HCC)   H/O left nephrectomy   Depression   Anxiety  Acute pulmonary embolism: CT is showed left pulmonary artery with right ventricular strain.  Will check echocardiogram.  IV heparin has been discontinued and she will be started on Eliquis.  She is hemodynamically stable.  Saturating fine on room air. She has high risk for developing clots because of history of cancer and SLE.  We will check lupus anticoagulant, cardiolipin antibodies. She will follow-up with her hematology/oncology as an outpatient.  CKD stage III: Baseline creatinine 0.8-.9 prior to nephrectomy.  Her kidney function has been stable since discharge on 09/20/2017.  We will continue to monitor kidney function.Continue gentle IV fluids today because her creatinine slightly bumped up.  History of SLE: Currently not on flare.  Continue Plaquenil.  History of renal cell carcinoma: Status post left nephrectomy by [name]. History of depression/anxiety: Continue Lexapro and Xanax    DVT prophylaxis: Eliquis Code Status: Full Family Communication: None present at the bedside Disposition Plan: Home  tomorrow   Consultants: None  Procedures: None  Antimicrobials: None  Subjective: Patient seen and examined the bedside this morning.  Remains comfortable.  No new issues/events.  Hemodynamically  stable.  Saturating fine on room air.  Objective: Vitals:   10/09/17 1320 10/09/17 1443 10/09/17 2019 10/10/17 0418  BP:  (!) 148/75 129/68 119/69  Pulse: 88 94 90 79  Resp: (!) 30 20 18 20   Temp:  98.8 F (37.1 C) 99.8 F (37.7 C) 98.9 F (37.2 C)  TempSrc:  Oral Oral Oral  SpO2: 98% 92% 94% 100%  Weight:      Height:        Intake/Output Summary (Last 24 hours) at 10/10/2017 1225 Last data filed at 10/09/2017 1800 Gross per 24 hour  Intake 288.4 ml  Output -  Net 288.4 ml   Filed Weights   10/09/17 0941  Weight: 79.8 kg (176 lb)    Examination:  General exam: Appears calm and comfortable ,Not in distress,average built HEENT:PERRL,Oral mucosa moist, Ear/Nose normal on gross exam Respiratory system: Bilateral equal air entry, normal vesicular breath sounds, no wheezes or crackles  Cardiovascular system: S1 & S2 heard, RRR. No JVD, murmurs, rubs, gallops or clicks. No pedal edema. Gastrointestinal system: Abdomen is nondistended, soft and nontender. No organomegaly or masses felt. Normal bowel sounds heard. Central nervous system: Alert and oriented. No focal neurological deficits. Extremities: No edema, no clubbing ,no cyanosis, distal peripheral pulses palpable. Skin: No rashes, lesions or ulcers,no icterus ,no pallor MSK: Normal muscle bulk,tone ,power Psychiatry: Judgement and insight appear normal. Mood & affect appropriate.     Data Reviewed: I have personally reviewed following labs and imaging studies  CBC: Recent Labs  Lab 10/09/17 0947 10/10/17 0519  WBC 7.7 7.4  HGB 12.0 11.4*  HCT 36.9 35.8*  MCV 91.6 90.6  PLT 227 562   Basic Metabolic Panel: Recent Labs  Lab 10/09/17 0947 10/10/17 0519  NA 139 136  K 4.5 3.9  CL 107 102  CO2 24 24   GLUCOSE 118* 86  BUN 13 11  CREATININE 1.14* 1.22*  CALCIUM 9.0 8.8*   GFR: Estimated Creatinine Clearance: 46.3 mL/min (A) (by C-G formula based on SCr of 1.22 mg/dL (H)). Liver Function Tests: Recent Labs  Lab 10/09/17 0947  AST 19  ALT 16  ALKPHOS 52  BILITOT 0.4  PROT 7.7  ALBUMIN 3.4*   Recent Labs  Lab 10/09/17 0947  LIPASE 37   No results for input(s): AMMONIA in the last 168 hours. Coagulation Profile: Recent Labs  Lab 10/09/17 0947  INR 1.01   Cardiac Enzymes: No results for input(s): CKTOTAL, CKMB, CKMBINDEX, TROPONINI in the last 168 hours. BNP (last 3 results) No results for input(s): PROBNP in the last 8760 hours. HbA1C: No results for input(s): HGBA1C in the last 72 hours. CBG: No results for input(s): GLUCAP in the last 168 hours. Lipid Profile: No results for input(s): CHOL, HDL, LDLCALC, TRIG, CHOLHDL, LDLDIRECT in the last 72 hours. Thyroid Function Tests: No results for input(s): TSH, T4TOTAL, FREET4, T3FREE, THYROIDAB in the last 72 hours. Anemia Panel: No results for input(s): VITAMINB12, FOLATE, FERRITIN, TIBC, IRON, RETICCTPCT in the last 72 hours. Sepsis Labs: No results for input(s): PROCALCITON, LATICACIDVEN in the last 168 hours.  No results found for this or any previous visit (from the past 240 hour(s)).       Radiology Studies: Dg Chest 2 View  Result Date: 10/09/2017 CLINICAL DATA:  Left-sided chest pain. EXAM: CHEST - 2 VIEW COMPARISON:  08/29/2017 FINDINGS: Cardiomediastinal silhouette is normal. Mediastinal contours appear intact. There is no evidence of pleural effusion or pneumothorax. Interval development of lingular atelectasis versus peribronchial airspace consolidation with volume loss in the left hemithorax. Osseous structures are without acute abnormality. Soft tissues are grossly normal. IMPRESSION: Interval development of lingular atelectasis versus peribronchial airspace consolidation with volume loss in the left  hemithorax. Electronically Signed   By: Fidela Salisbury M.D.   On: 10/09/2017 11:24   Ct Angio Chest Pe W/cm &/or Wo Cm  Result Date: 10/09/2017 CLINICAL DATA:  Acute left-sided abdominal pain. EXAM: CT ANGIOGRAPHY CHEST CT ABDOMEN AND PELVIS WITH CONTRAST TECHNIQUE: Multidetector CT imaging of the chest was performed using the standard protocol during bolus administration of intravenous contrast. Multiplanar CT image reconstructions and MIPs were obtained to evaluate the vascular anatomy. Multidetector CT imaging of the abdomen and pelvis was performed using the standard protocol during bolus administration of intravenous contrast. CONTRAST:  35mL ISOVUE-370 IOPAMIDOL (ISOVUE-370) INJECTION 76% COMPARISON:  CT scans of August 29, 2017 and August 20, 2017. FINDINGS: CTA CHEST FINDINGS Cardiovascular: Filling defect is noted in lower lobe branch of left pulmonary artery consistent with acute pulmonary embolus. RV/LV ratio of 1.0 is noted suggesting right heart strain. There is no evidence of thoracic aortic dissection or aneurysm. No pericardial effusion is noted. Mediastinum/Nodes: No enlarged mediastinal, hilar, or axillary lymph nodes. Thyroid gland, trachea, and esophagus demonstrate no significant findings. Lungs/Pleura: No pneumothorax is noted. Right lung is clear. Left lower lobe atelectasis or infiltrate is noted. Musculoskeletal: No chest wall abnormality. No acute or significant osseous findings. Review of the MIP images confirms the above findings. CT  ABDOMEN and PELVIS FINDINGS Hepatobiliary: No gallstones are noted. 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 No biliary dilatation is noted. Pancreas: Unremarkable. No pancreatic ductal dilatation or surrounding inflammatory changes. Spleen: Normal in size without focal abnormality. Adrenals/Urinary Tract: Adrenal glands appear normal. Status post left nephrectomy. Small right renal cyst is noted. No  hydronephrosis or renal obstruction is noted. Urinary bladder is unremarkable. Stomach/Bowel: Stomach is within normal limits. Appendix appears normal. No evidence of bowel wall thickening, distention, or inflammatory changes. Vascular/Lymphatic: Aortic atherosclerosis. No enlarged abdominal or pelvic lymph nodes. Reproductive: Status post hysterectomy. No adnexal masses. Other: No abdominal wall hernia or abnormality. No abdominopelvic ascites. Musculoskeletal: No acute or significant osseous findings. Review of the MIP images confirms the above findings. IMPRESSION: Pulmonary embolus seen in lower lobe branch of left pulmonary artery. Positive for acute PE with CT evidence of right heart strain (RV/LV Ratio = 1.0) consistent with at least submassive (intermediate risk) PE. The presence of right heart strain has been associated with an increased risk of morbidity and mortality. Please activate Code PE by paging [phone]. Critical Value/emergent results were called by telephone at the time of interpretation on 10/09/2017 at 1:08 pm to Dr. Quintella Reichert , who verbally acknowledged these results. Status post left nephrectomy. No acute abnormality seen in the abdomen or pelvis. Aortic Atherosclerosis (ICD10-I70.0). Electronically Signed   By: Marijo Conception, M.D.   On: 10/09/2017 13:10   Ct Abdomen Pelvis W Contrast  Result Date: 10/09/2017 CLINICAL DATA:  Acute left-sided abdominal pain. EXAM: CT ANGIOGRAPHY CHEST CT ABDOMEN AND PELVIS WITH CONTRAST TECHNIQUE: Multidetector CT imaging of the chest was performed using the standard protocol during bolus administration of intravenous contrast. Multiplanar CT image reconstructions and MIPs were obtained to evaluate the vascular anatomy. Multidetector CT imaging of the abdomen and pelvis was performed using the standard protocol during bolus administration of intravenous contrast. CONTRAST:  22mL ISOVUE-370 IOPAMIDOL (ISOVUE-370) INJECTION 76% COMPARISON:  CT  scans of August 29, 2017 and August 20, 2017. FINDINGS: CTA CHEST FINDINGS Cardiovascular: Filling defect is noted in lower lobe branch of left pulmonary artery consistent with acute pulmonary embolus. RV/LV ratio of 1.0 is noted suggesting right heart strain. There is no evidence of thoracic aortic dissection or aneurysm. No pericardial effusion is noted. Mediastinum/Nodes: No enlarged mediastinal, hilar, or axillary lymph nodes. Thyroid gland, trachea, and esophagus demonstrate no significant findings. Lungs/Pleura: No pneumothorax is noted. Right lung is clear. Left lower lobe atelectasis or infiltrate is noted. Musculoskeletal: No chest wall abnormality. No acute or significant osseous findings. Review of the MIP images confirms the above findings. CT ABDOMEN and PELVIS FINDINGS Hepatobiliary: No gallstones are noted. 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 No biliary dilatation is noted. Pancreas: Unremarkable. No pancreatic ductal dilatation or surrounding inflammatory changes. Spleen: Normal in size without focal abnormality. Adrenals/Urinary Tract: Adrenal glands appear normal. Status post left nephrectomy. Small right renal cyst is noted. No hydronephrosis or renal obstruction is noted. Urinary bladder is unremarkable. Stomach/Bowel: Stomach is within normal limits. Appendix appears normal. No evidence of bowel wall thickening, distention, or inflammatory changes. Vascular/Lymphatic: Aortic atherosclerosis. No enlarged abdominal or pelvic lymph nodes. Reproductive: Status post hysterectomy. No adnexal masses. Other: No abdominal wall hernia or abnormality. No abdominopelvic ascites. Musculoskeletal: No acute or significant osseous findings. Review of the MIP images confirms the above findings. IMPRESSION: Pulmonary embolus seen in lower lobe branch of left pulmonary artery.  Positive for acute PE with CT evidence of right heart strain (RV/LV Ratio = 1.0) consistent  with at least submassive (intermediate risk) PE. The presence of right heart strain has been associated with an increased risk of morbidity and mortality. Please activate Code PE by paging [phone]. Critical Value/emergent results were called by telephone at the time of interpretation on 10/09/2017 at 1:08 pm to Dr. Quintella Reichert , who verbally acknowledged these results. Status post left nephrectomy. No acute abnormality seen in the abdomen or pelvis. Aortic Atherosclerosis (ICD10-I70.0). Electronically Signed   By: Marijo Conception, M.D.   On: 10/09/2017 13:10        Scheduled Meds: . escitalopram  20 mg Oral Daily  . hydroxychloroquine  400 mg Oral Daily  . linaclotide  290 mcg Oral QAC breakfast   Continuous Infusions: . sodium chloride       LOS: 0 days    Time spent: 35 mins.More than 50% of that time was spent in counseling and/or coordination of care.      Shelly Coss, MD [facility] Pager [phone]  If 7PM-7AM, please contact night-coverage [email or web address] Password [email or web address] 10/10/2017, 12:25 PM

## 2017-10-10 NOTE — Progress Notes (Addendum)
[place] for IV heparin Indication: pulmonary embolus  Allergies  Allergen Reactions  . Bactrim [Sulfamethoxazole-Trimethoprim]     GI upset  . Cymbalta [Duloxetine Hcl]     Sweating  . Acyclovir And Related Itching    Patient Measurements: Height: 5' 1.5" (156.2 cm) Weight: 176 lb (79.8 kg) IBW/kg (Calculated) : 48.95 Heparin Dosing Weight: 58 kg  Vital Signs: Temp: 98.9 F (37.2 C) (05/27 0418) Temp Source: Oral (05/27 0418) BP: 119/69 (05/27 0418) Pulse Rate: 79 (05/27 0418)  Labs: Recent Labs    10/09/17 0947 10/09/17 1932 10/10/17 0519  HGB 12.0  --  11.4*  HCT 36.9  --  35.8*  PLT 227  --  268  APTT 35  --   --   LABPROT 13.2  --   --   INR 1.01  --   --   HEPARINUNFRC  --  0.81*  --   CREATININE 1.14*  --  1.22*   Estimated Creatinine Clearance: 46.3 mL/min (A) (by C-G formula based on SCr of 1.22 mg/dL (H)).  Medical History: Past Medical History:  Diagnosis Date  . Anxiety   . Arthritis    pt denies  . Cystocele   . Depression   . Hypertension    Labile  pt. denies no meds  . Lupus ([place])   . Pre-diabetes   . Prediabetes   . Seizure ([place])   . Sleep apnea   . Vitamin D deficiency     Medications:  Scheduled:  . escitalopram  20 mg Oral Daily  . hydroxychloroquine  400 mg Oral Daily  . linaclotide  290 mcg Oral QAC breakfast   Infusions:  . heparin 1,100 Units/hr (10/10/17 0452)    Assessment: 53 yoF c/o left side pain.  CT + LLL PE with right heart strain. IV heparin for PE.  Hx Lupus, HTN, OSA. Laparoscopic nephrectomy 5/6 for renal Ca, discharge same day Heparin 4000 unit bolus x1, drip at 1100 units/hr  Today, 10/10/2017  1st Heparin level last night was 0.81 units/ml, Heparin rate not changed 2nd Heparin level 0.85 units/ml on 1100 units/hr  Goal of Therapy:  Heparin level 0.3-0.7 units/ml Monitor platelets by anticoagulation protocol: Yes   Plan:   Reduce Heparin infusion to 950  units/hr  Recheck Hep level at 1600 today  Daily CBC, daily HL when at steady state  Minda Ditto PharmD Pager [phone] 10/10/2017, 9:51 AM

## 2017-10-10 NOTE — Discharge Instructions (Addendum)
Pulmonary Embolism A pulmonary embolism (PE) is a sudden blockage or decrease of blood flow in one lung or both lungs. Most blockages come from a blood clot that forms in a lower leg, thigh, or arm vein (deep vein thrombosis, DVT) and travels to the lungs. A clot is blood that has thickened into a gel or solid. PE is a dangerous and life-threatening condition that needs to be treated right away. What are the causes? This condition is usually caused by a blood clot that forms in a vein and moves to the lungs. In rare cases, it may be caused by air, fat, part of a tumor, or other tissue that moves through the veins and into the lungs. What increases the risk? The following factors may make you more likely to develop this condition:  Having DVT or a history of DVT.  Being older than age 67.  Personal or family history of blood clots or blood clotting disease.  Major or lengthy surgery.  Orthopedic surgery, especially hip or knee replacement.  Traumatic injury, such as breaking a hip or leg.  Spinal cord injury.  Stroke.  Taking medicines that contain estrogen. These include birth control pills and hormone replacement therapy.  Long-term (chronic) lung or heart disease.  Cancer and chemotherapy.  Having a central venous catheter.  Pregnancy and the period after delivery.  What are the signs or symptoms? Symptoms of this condition usually start suddenly and include:  Shortness of breath while active or at rest.  Coughing or coughing up blood or blood-tinged mucus.  Chest pain that is often worse with deep breaths.  Rapid or irregular heartbeat.  Feeling light-headed or dizzy.  Fainting.  Feeling anxious.  Sweating.  Pain and swelling in a leg. This is a symptom of DVT, which can lead to PE.  How is this diagnosed? This condition may be diagnosed based on:  Your medical history.  A physical exam.  Blood tests to check blood oxygen level and how well your  blood clots, and a D-dimer blood test, which checks your blood for a substance that is released when a blood clot breaks apart.  CT pulmonary angiogram. This test checks blood flow in and around your lungs.  Ventilation-perfusion scan, also called a lung VQ scan. This test measures air flow and blood flow to the lungs.  Ultrasound of the legs to look for blood clots.  How is this treated? Treatment for this conditions depends on many factors, such as the cause of your PE, your risk for bleeding or developing more clots, and other medical conditions you have. Treatment aims to remove, dissolve, or stop blood clots from forming or growing larger. Treatment may include:  Blood thinning medicines (anticoagulants) to stop clots from forming or growing. These medicines may be given as a pill, as an injection, or through an IV tube (infusion).  Medicines that dissolve clots (thrombolytics).  A procedure in which a flexible tube is used to remove a blood clot (embolectomy) or deliver medicine to destroy it (catheter-directed thrombolysis).  A procedure in which a filter is inserted into a large vein that carries blood to the heart (inferior vena cava). This filter (vena cava filter) catches blood clots before they reach the lungs.  Surgery to remove the clot (surgical embolectomy). This is rare.  You may need a combination of immediate, long-term (up to 3 months after diagnosis), and extended (more than 3 months after diagnosis) treatments. Your treatment may continue for several months (maintenance  therapy). You and your health care provider will work together to choose the treatment program that is best for you. Follow these instructions at home: If you are taking an anticoagulant medicine:  Take the medicine every day at the same time each day.  Understand what foods and drugs interact with your medicine.  Understand the side effects of this medicine, including excessive bruising or bleeding.  Ask your health care provider or pharmacist about other side effects. General instructions  Take over-the-counter and prescription medicines only as told by your health care provider.  Anticoagulant medicines may cause side effects, including easy bruising and difficulty stopping bleeding. If you are prescribed an anticoagulant: ? Hold pressure over cuts for longer than usual. ? Tell your dentist and other health care providers that you are taking anticoagulants before you have any procedure that may cause bleeding. ? Avoid contact sports. ? Be extra careful when handling sharp objects. ? Use a soft toothbrush. Floss with waxed dental floss. ? Shave with an Copy.  Wear a medical alert bracelet or carry a medical alert card that says you have had a PE.  Ask your health care provider when you may return to your normal activities.  Talk with your health care provider about any travel plans. It is important to make sure that you are still able to take your medicine while on trips.  Keep all follow-up visits as told by your health care provider. This is important. How is this prevented? Take these actions to lower your risk of developing another PE:  Exercise regularly. Take frequent walks. For at least 30 minutes every day, engage in: ? Activity that involves moving your arms and legs. ? Activity that encourages good blood flow through your body by increasing your heart rate.  While traveling, drink plenty of water and avoid drinking alcohol. Ask your health care provider if you should wear below-the-knee compression stockings.  Avoid sitting or lying in bed for long periods of time without moving your legs. Exercise your arms and legs every hour during long-distance travel (over 4 hours).  If you are hospitalized or have surgery, ask your health care provider about your risks and what treatments can help prevent blood clots.  Maintain a healthy weight. Ask your health care  provider what weight is healthy for you.  If you are a woman who is over age 38, avoid unnecessary use of medicines that contain estrogen, including birth control pills.  Do not use any products that contain nicotine or tobacco, such as cigarettes and e-cigarettes. This is especially important if you take estrogen medicines. If you need help quitting, ask your health care provider.  See your health care provider for regular checkups. This may include blood tests and ultrasound testing on your legs to check for new blood clots.  Contact a health care provider if:  You missed a dose of your blood thinner medicine. Get help right away if:  You have new or increased pain, swelling, warmth, or redness in an arm or leg.  You have numbness or tingling in an arm or leg.  You have shortness of breath while active or at rest.  You have chest pain.  You have a rapid or irregular heartbeat.  You feel light-headed or dizzy.  You cough up blood.  You have blood in your vomit, stool, or urine.  You have a fever.  You have abdomen (abdominal) pain.  You have a severe fall or head injury.  You have  a severe headache.  You have vision changes.  You cannot move your arms or legs.  You are confused or have memory loss.  You are bleeding for 10 minutes or more, even with strong pressure on the wound. These symptoms may represent a serious problem that is an emergency. Do not wait to see if the symptoms will go away. Get medical help right away. Call your local emergency services (911 in the [place] Do not drive yourself to the hospital. Summary  A pulmonary embolism (PE) is a sudden blockage or decrease of blood flow in one lung or both lungs. PE is a dangerous and life-threatening condition that needs to be treated right away.  Having deep vein thrombosis (DVT) or a history of DVT is the most common risk factor for PE.  Treatments for this condition usually include medicines to thin  your blood (anticoagulants) or medicines to break apart blood clots (thrombolytics).  If you are prescribed blood thinners, it is important to take the medicine every single day at the same time each day.  If you have signs of PE or DVT, call your local emergency services (911 in the [place] This information is not intended to replace advice given to you by your health care provider. Make sure you discuss any questions you have with your health care provider. Document Released: 04/30/2000 Document Revised: 06/05/2016 Document Reviewed: 06/05/2016 Elsevier Interactive Patient Education  2018 [place] on my medicine - ELIQUIS (apixaban)  This medication education was reviewed with me or my healthcare representative as part of my discharge preparation.  The pharmacist that spoke with me during my hospital stay was:  Minda Ditto, [facility]  Why was Eliquis prescribed for you? Eliquis was prescribed to treat blood clots that may have been found in the veins of your legs (deep vein thrombosis) or in your lungs (pulmonary embolism) and to reduce the risk of them occurring again.  What do You need to know about Eliquis ? The starting dose is 10 mg (two 5 mg tablets) taken TWICE daily for the FIRST SEVEN (7) DAYS, then on (enter date)  6/3  the dose is reduced to ONE 5 mg tablet taken TWICE daily.  Eliquis may be taken with or without food.   Try to take the dose about the same time in the morning and in the evening. If you have difficulty swallowing the tablet whole please discuss with your pharmacist how to take the medication safely.  Take Eliquis exactly as prescribed and DO NOT stop taking Eliquis without talking to the doctor who prescribed the medication.  Stopping may increase your risk of developing a new blood clot.  Refill your prescription before you run out.  After discharge, you should have regular check-up appointments with your healthcare provider that is  prescribing your Eliquis.    What do you do if you miss a dose? If a dose of ELIQUIS is not taken at the scheduled time, take it as soon as possible on the same day and twice-daily administration should be resumed. The dose should not be doubled to make up for a missed dose.  Important Safety Information A possible side effect of Eliquis is bleeding. You should call your healthcare provider right away if you experience any of the following: ? Bleeding from an injury or your nose that does not stop. ? Unusual colored urine (red or dark brown) or unusual colored stools (red or black). ? Unusual bruising for unknown reasons. ? A  serious fall or if you hit your head (even if there is no bleeding).  Some medicines may interact with Eliquis and might increase your risk of bleeding or clotting while on Eliquis. To help avoid this, consult your healthcare provider or pharmacist prior to using any new prescription or non-prescription medications, including herbals, vitamins, non-steroidal anti-inflammatory drugs (NSAIDs) and supplements.  This website has more information on Eliquis (apixaban): http://www.eliquis.com/eliquis/home

## 2017-10-10 NOTE — Progress Notes (Signed)
  Echocardiogram 2D Echocardiogram has been performed.  Beth [name] 10/10/2017, 1:31 PM

## 2017-10-11 ENCOUNTER — Observation Stay (HOSPITAL_COMMUNITY): Payer: BC Managed Care – PPO

## 2017-10-11 DIAGNOSIS — I2699 Other pulmonary embolism without acute cor pulmonale: Secondary | ICD-10-CM | POA: Diagnosis not present

## 2017-10-11 LAB — BASIC METABOLIC PANEL
Anion gap: 8 (ref 5–15)
BUN: 15 mg/dL (ref 6–20)
CO2: 23 mmol/L (ref 22–32)
Calcium: 8.6 mg/dL — ABNORMAL LOW (ref 8.9–10.3)
Chloride: 111 mmol/L (ref 101–111)
Creatinine, Ser: 1.22 mg/dL — ABNORMAL HIGH (ref 0.44–1.00)
GFR calc Af Amer: 54 mL/min — ABNORMAL LOW (ref >60)
GFR calc non Af Amer: 46 mL/min — ABNORMAL LOW (ref >60)
Glucose, Bld: 107 mg/dL — ABNORMAL HIGH (ref 65–99)
Potassium: 4.2 mmol/L (ref 3.5–5.1)
Sodium: 142 mmol/L (ref 135–145)

## 2017-10-11 MED ORDER — APIXABAN 5 MG PO TABS: ORAL_TABLET | ORAL | 0 refills | Status: DC

## 2017-10-11 NOTE — Discharge Summary (Signed)
Physician Discharge Summary  Beth [name] WUJ:[id number] DOB: 1955/06/07 DOA: 10/09/2017  PCP: [name], MD  Admit date: 10/09/2017 Discharge date: 10/11/2017  Admitted From: Home Disposition:  Home  Discharge Condition:Stable CODE STATUS:FULL Diet recommendation:  Regular   Brief/Interim Summary: This 62 year old female with past medical history of depression, anxiety, lupus, recent left nephrectomy who presented to the emergency department with complaints of left-sided chest pain and left upper quadrant abdominal pain.  Patient reported that she underwent left nephrectomy on 5/6 due to concern for renal cell carcinoma.  Patient was found to have PE in the lower lobe of left pulmonary artery with evidence of right ventricular strain consistent with submassive PE.  Started on heparin drip.   IV heparin have been changed to Eliquis.  This morning she was hemodynamically stable.  No active issues.  We have sent hypercoagulable profile along with lupus anticoagulant/cardiolipin antibodies.  These are pending.  She will follow-up with hematology/oncology as an outpatient.  Following problems were addressed during her hospitalization:  Acute pulmonary embolism: CT is showed left pulmonary artery with right ventricular strain.  IV heparin has been discontinued and she will be started on Eliquis.  She is hemodynamically stable.  Saturating fine on room air. She has high risk for developing clots because of history of cancer and SLE.  We have sent for lupus anticoagulant, cardiolipin antibodies. She will follow-up with her hematology/oncology as an outpatient. Echocardiogram showed ejection fraction of 55%, left ventricle hypertrophy, no regional wall motion abnormalities.  CKD stage III: Baseline creatinine 0.8-.9 prior to nephrectomy.  Her kidney function has been stable since discharge on 09/20/2017.  We will continue to monitor kidney function.Check BMP in a week.  History of SLE:  Currently not on flare.  Continue Plaquenil.  History of renal cell carcinoma: Status post left nephrectomy by [name]. History of depression/anxiety: Continue Lexapro and Xanax   Discharge Diagnoses:  Principal Problem:   Pulmonary embolism (HCC) Active Problems:   SLE   Renal cell carcinoma (HCC)   H/O left nephrectomy   Depression   Anxiety    Discharge Instructions  Discharge Instructions    Ambulatory referral to Hematology / Oncology   Complete by:  As directed    Diet general   Complete by:  As directed    Discharge instructions   Complete by:  As directed    1) Follow up with your PCP in a week.  Do a CBC and BMP test during the follow-up 2) Take prescribed medication as instructed. 3)Follow with your urologist as an outpatient. 4) Follow up with hematology/oncology as outpatient.  Name and number the provider has been attached.   Increase activity slowly   Complete by:  As directed      Allergies as of 10/11/2017      Reactions   Bactrim [sulfamethoxazole-trimethoprim]    GI upset   Cymbalta [duloxetine Hcl]    Sweating   Acyclovir And Related Itching      Medication List    TAKE these medications   acetaminophen 500 MG tablet Commonly known as:  TYLENOL Take 1,000 mg by mouth every 6 (six) hours as needed for mild pain.   ALPRAZolam 0.5 MG tablet Commonly known as:  XANAX Take 1 tablet (0.5 mg total) by mouth 3 (three) times daily as needed for anxiety.   apixaban 5 MG Tabs tablet Commonly known as:  ELIQUIS Take 10 mg(2 pills) twice a day for 6 days then continue taking 5mg  ( 1  pill) twice a day   escitalopram 20 MG tablet Commonly known as:  LEXAPRO Take 1 tablet (20 mg total) by mouth daily.   hydroxychloroquine 200 MG tablet Commonly known as:  PLAQUENIL Take 2 tablets (400 mg total) daily by mouth.   LINZESS 290 MCG Caps capsule Generic drug:  linaclotide Take 290 mcg by mouth daily before breakfast.   Magnesium 500 MG Tabs Take  500 mg by mouth daily.   meloxicam 15 MG tablet Commonly known as:  MOBIC TAKE 1 TAB DAILY W/FOOD FOR 2 WEEKS, THEN AS NEEDED, CAN TAKE W/TYLENOL, DON'T TAKE W/ALEVE OR IBU   Vitamin D 2000 units Caps Take 1 capsule by mouth daily.      Follow-up Information    [name], MD. Schedule an appointment as soon as possible for a visit in 1 week(s).   Specialty:  Internal Medicine Contact information: [address] [place] [address] [phone]        Wyatt Portela, MD. Schedule an appointment as soon as possible for a visit in 2 week(s).   Specialty:  Oncology Contact information: [address] [place] [address] [phone]          Allergies  Allergen Reactions  . Bactrim [Sulfamethoxazole-Trimethoprim]     GI upset  . Cymbalta [Duloxetine Hcl]     Sweating  . Acyclovir And Related Itching    Consultations: None  Procedures/Studies: Dg Chest 2 View  Result Date: 10/09/2017 CLINICAL DATA:  Left-sided chest pain. EXAM: CHEST - 2 VIEW COMPARISON:  08/29/2017 FINDINGS: Cardiomediastinal silhouette is normal. Mediastinal contours appear intact. There is no evidence of pleural effusion or pneumothorax. Interval development of lingular atelectasis versus peribronchial airspace consolidation with volume loss in the left hemithorax. Osseous structures are without acute abnormality. Soft tissues are grossly normal. IMPRESSION: Interval development of lingular atelectasis versus peribronchial airspace consolidation with volume loss in the left hemithorax. Electronically Signed   By: Fidela Salisbury M.D.   On: 10/09/2017 11:24   Ct Angio Chest Pe W/cm &/or Wo Cm  Result Date: 10/09/2017 CLINICAL DATA:  Acute left-sided abdominal pain. EXAM: CT ANGIOGRAPHY CHEST CT ABDOMEN AND PELVIS WITH CONTRAST TECHNIQUE: Multidetector CT imaging of the chest was performed using the standard protocol during bolus administration of intravenous  contrast. Multiplanar CT image reconstructions and MIPs were obtained to evaluate the vascular anatomy. Multidetector CT imaging of the abdomen and pelvis was performed using the standard protocol during bolus administration of intravenous contrast. CONTRAST:  16mL ISOVUE-370 IOPAMIDOL (ISOVUE-370) INJECTION 76% COMPARISON:  CT scans of August 29, 2017 and August 20, 2017. FINDINGS: CTA CHEST FINDINGS Cardiovascular: Filling defect is noted in lower lobe branch of left pulmonary artery consistent with acute pulmonary embolus. RV/LV ratio of 1.0 is noted suggesting right heart strain. There is no evidence of thoracic aortic dissection or aneurysm. No pericardial effusion is noted. Mediastinum/Nodes: No enlarged mediastinal, hilar, or axillary lymph nodes. Thyroid gland, trachea, and esophagus demonstrate no significant findings. Lungs/Pleura: No pneumothorax is noted. Right lung is clear. Left lower lobe atelectasis or infiltrate is noted. Musculoskeletal: No chest wall abnormality. No acute or significant osseous findings. Review of the MIP images confirms the above findings. CT ABDOMEN and PELVIS FINDINGS Hepatobiliary: No gallstones are noted. 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 No biliary dilatation is noted. Pancreas: Unremarkable. No pancreatic ductal dilatation or surrounding inflammatory changes. Spleen: Normal in size without focal abnormality. Adrenals/Urinary Tract: Adrenal glands  appear normal. Status post left nephrectomy. Small right renal cyst is noted. No hydronephrosis or renal obstruction is noted. Urinary bladder is unremarkable. Stomach/Bowel: Stomach is within normal limits. Appendix appears normal. No evidence of bowel wall thickening, distention, or inflammatory changes. Vascular/Lymphatic: Aortic atherosclerosis. No enlarged abdominal or pelvic lymph nodes. Reproductive: Status post hysterectomy. No adnexal masses. Other: No abdominal wall hernia or  abnormality. No abdominopelvic ascites. Musculoskeletal: No acute or significant osseous findings. Review of the MIP images confirms the above findings. IMPRESSION: Pulmonary embolus seen in lower lobe branch of left pulmonary artery. Positive for acute PE with CT evidence of right heart strain (RV/LV Ratio = 1.0) consistent with at least submassive (intermediate risk) PE. The presence of right heart strain has been associated with an increased risk of morbidity and mortality. Please activate Code PE by paging [phone]. Critical Value/emergent results were called by telephone at the time of interpretation on 10/09/2017 at 1:08 pm to Dr. Quintella Reichert , who verbally acknowledged these results. Status post left nephrectomy. No acute abnormality seen in the abdomen or pelvis. Aortic Atherosclerosis (ICD10-I70.0). Electronically Signed   By: Marijo Conception, M.D.   On: 10/09/2017 13:10   Ct Abdomen Pelvis W Contrast  Result Date: 10/09/2017 CLINICAL DATA:  Acute left-sided abdominal pain. EXAM: CT ANGIOGRAPHY CHEST CT ABDOMEN AND PELVIS WITH CONTRAST TECHNIQUE: Multidetector CT imaging of the chest was performed using the standard protocol during bolus administration of intravenous contrast. Multiplanar CT image reconstructions and MIPs were obtained to evaluate the vascular anatomy. Multidetector CT imaging of the abdomen and pelvis was performed using the standard protocol during bolus administration of intravenous contrast. CONTRAST:  41mL ISOVUE-370 IOPAMIDOL (ISOVUE-370) INJECTION 76% COMPARISON:  CT scans of August 29, 2017 and August 20, 2017. FINDINGS: CTA CHEST FINDINGS Cardiovascular: Filling defect is noted in lower lobe branch of left pulmonary artery consistent with acute pulmonary embolus. RV/LV ratio of 1.0 is noted suggesting right heart strain. There is no evidence of thoracic aortic dissection or aneurysm. No pericardial effusion is noted. Mediastinum/Nodes: No enlarged mediastinal, hilar, or  axillary lymph nodes. Thyroid gland, trachea, and esophagus demonstrate no significant findings. Lungs/Pleura: No pneumothorax is noted. Right lung is clear. Left lower lobe atelectasis or infiltrate is noted. Musculoskeletal: No chest wall abnormality. No acute or significant osseous findings. Review of the MIP images confirms the above findings. CT ABDOMEN and PELVIS FINDINGS Hepatobiliary: No gallstones are noted. 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 No biliary dilatation is noted. Pancreas: Unremarkable. No pancreatic ductal dilatation or surrounding inflammatory changes. Spleen: Normal in size without focal abnormality. Adrenals/Urinary Tract: Adrenal glands appear normal. Status post left nephrectomy. Small right renal cyst is noted. No hydronephrosis or renal obstruction is noted. Urinary bladder is unremarkable. Stomach/Bowel: Stomach is within normal limits. Appendix appears normal. No evidence of bowel wall thickening, distention, or inflammatory changes. Vascular/Lymphatic: Aortic atherosclerosis. No enlarged abdominal or pelvic lymph nodes. Reproductive: Status post hysterectomy. No adnexal masses. Other: No abdominal wall hernia or abnormality. No abdominopelvic ascites. Musculoskeletal: No acute or significant osseous findings. Review of the MIP images confirms the above findings. IMPRESSION: Pulmonary embolus seen in lower lobe branch of left pulmonary artery. Positive for acute PE with CT evidence of right heart strain (RV/LV Ratio = 1.0) consistent with at least submassive (intermediate risk) PE. The presence of right heart strain has been associated with an increased risk of morbidity and mortality. Please activate Code PE by paging [phone]. Critical  Value/emergent results were called by telephone at the time of interpretation on 10/09/2017 at 1:08 pm to Dr. Quintella Reichert , who verbally acknowledged these results. Status post left nephrectomy. No  acute abnormality seen in the abdomen or pelvis. Aortic Atherosclerosis (ICD10-I70.0). Electronically Signed   By: Marijo Conception, M.D.   On: 10/09/2017 13:10      Subjective: Patient seen and examined at the bedside this morning.  Remains comfortable.  No new issues.  Stable for discharge home  Discharge Exam: Vitals:   10/10/17 2118 10/11/17 0452  BP:  131/72  Pulse: 63 61  Resp: 18 18  Temp:  97.6 F (36.4 C)  SpO2: 99% 100%   Vitals:   10/10/17 1328 10/10/17 2027 10/10/17 2118 10/11/17 0452  BP: (!) 126/95 (!) 136/92  131/72  Pulse: 70 75 63 61  Resp: 18 16 18 18   Temp: 98.2 F (36.8 C) 98.4 F (36.9 C)  97.6 F (36.4 C)  TempSrc: Oral Oral  Oral  SpO2: 96% 100% 99% 100%  Weight:      Height:        General: Pt is alert, awake, not in acute distress Cardiovascular: RRR, S1/S2 +, no rubs, no gallops Respiratory: CTA bilaterally, no wheezing, no rhonchi Abdominal: Soft, NT, ND, bowel sounds + Extremities: no edema, no cyanosis    The results of significant diagnostics from this hospitalization (including imaging, microbiology, ancillary and laboratory) are listed below for reference.     Microbiology: No results found for this or any previous visit (from the past 240 hour(s)).   Labs: BNP (last 3 results) No results for input(s): BNP in the last 8760 hours. Basic Metabolic Panel: Recent Labs  Lab 10/09/17 0947 10/10/17 0519 10/11/17 0535  NA 139 136 142  K 4.5 3.9 4.2  CL 107 102 111  CO2 24 24 23   GLUCOSE 118* 86 107*  BUN 13 11 15   CREATININE 1.14* 1.22* 1.22*  CALCIUM 9.0 8.8* 8.6*   Liver Function Tests: Recent Labs  Lab 10/09/17 0947  AST 19  ALT 16  ALKPHOS 52  BILITOT 0.4  PROT 7.7  ALBUMIN 3.4*   Recent Labs  Lab 10/09/17 0947  LIPASE 37   No results for input(s): AMMONIA in the last 168 hours. CBC: Recent Labs  Lab 10/09/17 0947 10/10/17 0519  WBC 7.7 7.4  HGB 12.0 11.4*  HCT 36.9 35.8*  MCV 91.6 90.6  PLT 227 268    Cardiac Enzymes: No results for input(s): CKTOTAL, CKMB, CKMBINDEX, TROPONINI in the last 168 hours. BNP: Invalid input(s): POCBNP CBG: No results for input(s): GLUCAP in the last 168 hours. D-Dimer Recent Labs    10/09/17 0947  DDIMER 1.77*   Hgb A1c No results for input(s): HGBA1C in the last 72 hours. Lipid Profile No results for input(s): CHOL, HDL, LDLCALC, TRIG, CHOLHDL, LDLDIRECT in the last 72 hours. Thyroid function studies No results for input(s): TSH, T4TOTAL, T3FREE, THYROIDAB in the last 72 hours.  Invalid input(s): FREET3 Anemia work up No results for input(s): VITAMINB12, FOLATE, FERRITIN, TIBC, IRON, RETICCTPCT in the last 72 hours. Urinalysis    Component Value Date/Time   COLORURINE YELLOW 10/09/2017 1319   APPEARANCEUR CLEAR 10/09/2017 1319   LABSPEC 1.010 10/09/2017 1319   PHURINE 6.5 10/09/2017 1319   GLUCOSEU NEGATIVE 10/09/2017 1319   HGBUR NEGATIVE 10/09/2017 1319   BILIRUBINUR NEGATIVE 10/09/2017 [place] 10/09/2017 [place] 10/09/2017 1319   UROBILINOGEN 1 11/21/2014  [place] 10/09/2017 1319   LEUKOCYTESUR NEGATIVE 10/09/2017 1319   Sepsis Labs Invalid input(s): PROCALCITONIN,  WBC,  LACTICIDVEN Microbiology No results found for this or any previous visit (from the past 240 hour(s)).  Please note: You were cared for by a hospitalist during your hospital stay. Once you are discharged, your primary care physician will handle any further medical issues. Please note that NO REFILLS for any discharge medications will be authorized once you are discharged, as it is imperative that you return to your primary care physician (or establish a relationship with a primary care physician if you do not have one) for your post hospital discharge needs so that they can reassess your need for medications and monitor your lab values.    Time coordinating discharge: 40 minutes  SIGNED:   Shelly Coss,  MD  [facility] 10/11/2017, 10:54 AM Pager [phone]  If 7PM-7AM, please contact night-coverage [email or web address] Password [email or web address]

## 2017-10-11 NOTE — Progress Notes (Signed)
D/C instructions reviewed w/ pt.  Pt verbalizes understanding and all questions answered. Pt d/c in w/c in stable condition by NT to son's car. Pt in possession of d/c packet, script, and all personal belongings.

## 2017-10-11 NOTE — Plan of Care (Signed)
  Problem: Education: Goal: Knowledge of General Education information will improve Outcome: Completed/Met   Problem: Health Behavior/Discharge Planning: Goal: Ability to manage health-related needs will improve Outcome: Completed/Met   Problem: Clinical Measurements: Goal: Ability to maintain clinical measurements within normal limits will improve Outcome: Completed/Met Goal: Diagnostic test results will improve Outcome: Completed/Met Goal: Respiratory complications will improve Outcome: Completed/Met Goal: Cardiovascular complication will be avoided Outcome: Completed/Met   Problem: Safety: Goal: Ability to remain free from injury will improve Outcome: Completed/Met

## 2017-10-12 LAB — LUPUS ANTICOAGULANT PANEL
DRVVT: 49.3 s — ABNORMAL HIGH (ref 0.0–47.0)
PTT Lupus Anticoagulant: 62.1 s — ABNORMAL HIGH (ref 0.0–51.9)

## 2017-10-12 LAB — PTT-LA MIX: PTT-LA Mix: 57.2 s — ABNORMAL HIGH (ref 0.0–48.9)

## 2017-10-12 LAB — DRVVT MIX: dRVVT Mix: 44.2 s (ref 0.0–47.0)

## 2017-10-12 LAB — CARDIOLIPIN ANTIBODIES, IGG, IGM, IGA
Anticardiolipin IgA: <9 APL U/mL (ref 0–11)
Anticardiolipin IgG: <9 GPL U/mL (ref 0–14)
Anticardiolipin IgM: <9 MPL U/mL (ref 0–12)

## 2017-10-12 LAB — PROTEIN C ACTIVITY: Protein C Activity: 126 % (ref 73–180)

## 2017-10-12 LAB — HEXAGONAL PHASE PHOSPHOLIPID: Hexagonal Phase Phospholipid: 6 s (ref 0–11)

## 2017-10-12 LAB — PROTEIN S ACTIVITY: Protein S Activity: 33 % — ABNORMAL LOW (ref 63–140)

## 2017-10-13 ENCOUNTER — Encounter: Payer: Self-pay | Admitting: Internal Medicine

## 2017-10-13 LAB — FACTOR 5 LEIDEN

## 2017-10-14 ENCOUNTER — Other Ambulatory Visit: Payer: Self-pay | Admitting: Internal Medicine

## 2017-10-14 ENCOUNTER — Encounter: Payer: Self-pay | Admitting: Internal Medicine

## 2017-10-14 ENCOUNTER — Encounter: Payer: Self-pay | Admitting: Oncology

## 2017-10-14 MED ORDER — BUPROPION HCL ER (XL) 150 MG PO TB24: ORAL_TABLET | ORAL | 1 refills | Status: DC

## 2017-10-16 NOTE — Progress Notes (Signed)
FOLLOW UP  Assessment and Plan:   Pulmonary embolism Continue elequis Follow up with hem/onc as schduled  Discussed if patient falls to immediately contact office or go to ER.   Hypertension Well controlled with current medications  Monitor blood pressure at home; patient to call if consistently greater than 130/80 Continue DASH diet.   Reminder to go to the ER if any CP, SOB, nausea, dizziness, severe HA, changes vision/speech, left arm numbness and tingling and jaw pain.  Cholesterol Currently near goal by lifestyle Continue low cholesterol diet and exercise.  Check lipid panel.   Prediabetes Continue diet and exercise.  Perform daily foot/skin check, notify office of any concerning changes.  Check A1C  Obesity with co morbidities Long discussion about weight loss, diet, and exercise Recommended diet heavy in fruits and veggies and low in animal meats, cheeses, and dairy products, appropriate calorie intake Discussed ideal weight for height  Will follow up in 3 months  Depression/anxiety Following up with [name] for counseling/management Will proceed with adding wellbutrin per Dr. Jerilynn Mages, if insufficient control discussed possibly adding low dose ability in the evenings    Continue diet and meds as discussed. Further disposition pending results of labs. Discussed med's effects and SE's.   Over 30 minutes of exam, counseling, chart review, and critical decision making was performed.   Future Appointments  Date Time Provider [place]  11/04/2017 11:00 AM Wyatt Portela, MD [facility] None  02/02/2018 10:00 AM Vicie Mutters, PA-C [facility] None    ----------------------------------------------------------------------------------------------------------------------  HPI 62 y.o. AA female with hx of SLE on plaquenil presents for 3 month follow up on hypertension, cholesterol, diabetes, weight and vitamin D deficiency. She recently underwent left laparoscopic  nephrectomy by Dr. Link Snuffer on 09/19/2017 for left renal mass and carcinoma, after which she presented to the ED on 10/09/2017 with L chest pain and was found to have PE in the lower lobe of left pulmonary artery with evidence of right ventricular strain consistent with submassive PE. She was treated by IV heparin drip then transitioned to elequis, with follow up scheduled with Dr. Alen Blew on 11/04/2017.   she has a diagnosis of major depression/anxiety and is currently on lexapro 20 mg, xanax 0.5 mg TID PRN, reports symptoms are not currently well controlled, but has been referred to [name] who has requested that we postpone dose adjustment at this time while they work through a few more counseling sessions. she is currently utilizing xanax 0.5 mg TID since discussion of recent surgery. She has reached out to [place] via MyChart and would strongly prefer to proceed with addition of alternate agent for mood; after discussion with Dr. Melford Aase, will proceed with addition of wellbutrin 150 mg daily.   BMI is Body mass index is 33.11 kg/m., she has been working on diet but admits to not exercising enough - she does walk 1-2 times a week. She does drink a lot of water - drinks about 1 gallon daily.  Wt Readings from Last 3 Encounters:  10/17/17 181 lb (82.1 kg)  10/09/17 176 lb (79.8 kg)  09/19/17 182 lb (82.6 kg)   Her blood pressure has been controlled at home, today their BP is BP: 120/76  She does workout. She denies chest pain, shortness of breath, dizziness.   She is not on cholesterol medication and denies myalgias. Her cholesterol is not at goal. The cholesterol last visit was:   Lab Results  Component Value Date   CHOL 166 07/04/2017  HDL 39 (L) 07/04/2017   LDLCALC 107 (H) 07/04/2017   TRIG 107 07/04/2017   CHOLHDL 4.3 07/04/2017    She has been working on diet and exercise for prediabetes, and denies foot ulcerations, increased appetite, nausea, paresthesia of the feet, polydipsia,  polyuria, visual disturbances, vomiting and weight loss. Last A1C in the office was:  Lab Results  Component Value Date   HGBA1C 5.8 (H) 09/13/2017   Patient is on Vitamin D supplement.   Lab Results  Component Value Date   VD25OH 55 12/23/2016        Current Medications:  Current Outpatient Medications on File Prior to Visit  Medication Sig  . acetaminophen (TYLENOL) 500 MG tablet Take 1,000 mg by mouth every 6 (six) hours as needed for mild pain.  Marland Kitchen ALPRAZolam (XANAX) 0.5 MG tablet Take 1 tablet (0.5 mg total) by mouth 3 (three) times daily as needed for anxiety.  Marland Kitchen apixaban (ELIQUIS) 5 MG TABS tablet Take 10 mg(2 pills) twice a day for 6 days then continue taking 5mg  ( 1 pill) twice a day (Patient taking differently: Take 5mg  twice a day by mouth)  . Cholecalciferol (VITAMIN D) 2000 UNITS CAPS Take 1 capsule by mouth daily.  Marland Kitchen escitalopram (LEXAPRO) 20 MG tablet Take 1 tablet (20 mg total) by mouth daily.  . hydroxychloroquine (PLAQUENIL) 200 MG tablet Take 2 tablets (400 mg total) daily by mouth.  . linaclotide (LINZESS) 290 MCG CAPS capsule Take 290 mcg by mouth daily before breakfast.  . Magnesium 500 MG TABS Take 500 mg by mouth daily.   . meloxicam (MOBIC) 15 MG tablet TAKE 1 TAB DAILY W/FOOD FOR 2 WEEKS, THEN AS NEEDED, CAN TAKE W/TYLENOL, DON'T TAKE W/ALEVE OR IBU   No current facility-administered medications on file prior to visit.      Allergies:  Allergies  Allergen Reactions  . Bactrim [Sulfamethoxazole-Trimethoprim]     GI upset  . Cymbalta [Duloxetine Hcl]     Sweating  . Acyclovir And Related Itching     Medical History:  Past Medical History:  Diagnosis Date  . Anxiety   . Arthritis    pt denies  . Cystocele   . Depression   . Hypertension    Labile  pt. denies no meds  . Lupus ([place])   . Pre-diabetes   . Prediabetes   . Seizure ([place])   . Sleep apnea   . Vitamin D deficiency    Family history- Reviewed and unchanged Social history- Reviewed  and unchanged   Review of Systems:  Review of Systems  Constitutional: Negative for malaise/fatigue and weight loss.  HENT: Negative for hearing loss and tinnitus.   Eyes: Negative for blurred vision and double vision.  Respiratory: Negative for cough, shortness of breath and wheezing.   Cardiovascular: Negative for chest pain, palpitations, orthopnea, claudication and leg swelling.  Gastrointestinal: Negative for abdominal pain, blood in stool, constipation, diarrhea, heartburn, melena, nausea and vomiting.  Genitourinary: Negative.   Musculoskeletal: Negative for joint pain and myalgias.  Skin: Negative for rash.  Neurological: Negative for dizziness, tingling, sensory change, weakness and headaches.  Endo/Heme/Allergies: Negative for polydipsia.  Psychiatric/Behavioral: Positive for depression. Negative for substance abuse and suicidal ideas. The patient is nervous/anxious.   All other systems reviewed and are negative.   Physical Exam: BP 120/76   Pulse 70   Temp (!) 97.3 F (36.3 C)   Ht 5\' 2"  (1.575 m)   Wt 181 lb (82.1 kg)   SpO2 99%  BMI 33.11 kg/m  Wt Readings from Last 3 Encounters:  10/17/17 181 lb (82.1 kg)  10/09/17 176 lb (79.8 kg)  09/19/17 182 lb (82.6 kg)   General Appearance: Well nourished, in no apparent distress. Eyes: PERRLA, EOMs, conjunctiva no swelling or erythema Sinuses: No Frontal/maxillary tenderness ENT/Mouth: Ext aud canals clear, TMs without erythema, bulging. No erythema, swelling, or exudate on post pharynx.  Tonsils not swollen or erythematous. Hearing normal.  Neck: Supple, thyroid normal.  Respiratory: Respiratory effort normal, BS equal bilaterally without rales, rhonchi, wheezing or stridor.  Cardio: RRR with no MRGs. Brisk peripheral pulses without edema.  Abdomen: Soft, + BS.  Non tender, no guarding, rebound, hernias, masses. Lymphatics: Non tender without lymphadenopathy.  Musculoskeletal: Full ROM, 5/5 strength, Normal  gait Skin: Warm, dry without rashes, lesions, ecchymosis.  Neuro: Cranial nerves intact. No cerebellar symptoms.  Psych: Awake and oriented X 3, normal affect, Insight and Judgment appropriate.    Izora Ribas, NP 9:36 AM [facility]

## 2017-10-17 ENCOUNTER — Ambulatory Visit: Payer: BC Managed Care – PPO | Admitting: Adult Health

## 2017-10-17 ENCOUNTER — Encounter: Payer: Self-pay | Admitting: Adult Health

## 2017-10-17 VITALS — BP 120/76 | HR 70 | Temp 97.3°F | Ht 62.0 in | Wt 181.0 lb

## 2017-10-17 DIAGNOSIS — I2699 Other pulmonary embolism without acute cor pulmonale: Secondary | ICD-10-CM

## 2017-10-17 DIAGNOSIS — Z79899 Other long term (current) drug therapy: Secondary | ICD-10-CM

## 2017-10-17 DIAGNOSIS — E669 Obesity, unspecified: Secondary | ICD-10-CM | POA: Diagnosis not present

## 2017-10-17 DIAGNOSIS — I10 Essential (primary) hypertension: Secondary | ICD-10-CM | POA: Diagnosis not present

## 2017-10-17 DIAGNOSIS — F339 Major depressive disorder, recurrent, unspecified: Secondary | ICD-10-CM | POA: Diagnosis not present

## 2017-10-17 DIAGNOSIS — R7303 Prediabetes: Secondary | ICD-10-CM | POA: Diagnosis not present

## 2017-10-17 DIAGNOSIS — E785 Hyperlipidemia, unspecified: Secondary | ICD-10-CM | POA: Diagnosis not present

## 2017-10-17 MED ORDER — BUPROPION HCL ER (XL) 150 MG PO TB24: ORAL_TABLET | ORAL | 1 refills | Status: DC

## 2017-10-17 NOTE — Patient Instructions (Signed)
(Abilify) - if not seeing progress in another few weeks, we might add on a low dose of abilify - can be helpful for depression not responding well to other medications  Do need to be careful as can cause weight gain if not conscious of this  Aripiprazole tablets What is this medicine? ARIPIPRAZOLE (ay ri PIP ray zole) is an atypical antipsychotic. It is used to treat schizophrenia and bipolar disorder, also known as manic-depression. It is also used to treat Tourette's disorder and some symptoms of autism. This medicine may also be used in combination with antidepressants to treat major depressive disorder. This medicine may be used for other purposes; ask your health care provider or pharmacist if you have questions. COMMON BRAND NAME(S): Abilify What should I tell my health care provider before I take this medicine? They need to know if you have any of these conditions: -dehydration -dementia -diabetes -heart disease -history of stroke -low blood counts, like low white cell, platelet, or red cell counts -Parkinson's disease -seizures -suicidal thoughts, plans, or attempt; a previous suicide attempt by you or a family member -an unusual or allergic reaction to aripiprazole, other medicines, foods, dyes, or preservatives -pregnant or trying to get pregnant -breast-feeding How should I use this medicine? Take this medicine by mouth with a glass of water. Follow the directions on the prescription label. You can take this medicine with or without food. Take your doses at regular intervals. Do not take your medicine more often than directed. Do not stop taking except on the advice of your doctor or health care professional. A special MedGuide will be given to you by the pharmacist with each prescription and refill. Be sure to read this information carefully each time. Talk to your pediatrician regarding the use of this medicine in children. While this drug may be prescribed for  children as young as 23 years of age for selected conditions, precautions do apply. Overdosage: If you think you have taken too much of this medicine contact a poison control center or emergency room at once. NOTE: This medicine is only for you. Do not share this medicine with others. What if I miss a dose? If you miss a dose, take it as soon as you can. If it is almost time for your next dose, take only that dose. Do not take double or extra doses. What may interact with this medicine? Do not take this medicine with any of the following medications: -brexpiprazole -cisapride -dofetilide -dronedarone -metoclopramide -pimozide -thioridazine This medicine may also interact with the following medications: -alcohol -carbamazepine -certain medicines for anxiety or sleep -certain medicines for blood pressure -certain medicines for fungal infections like ketoconazole, fluconazole, posaconazole, and itraconazole -clarithromycin -fluoxetine -other medicines that prolong the QT interval (cause an abnormal heart rhythm) -paroxetine -quinidine -rifampin This list may not describe all possible interactions. Give your health care provider a list of all the medicines, herbs, non-prescription drugs, or dietary supplements you use. Also tell them if you smoke, drink alcohol, or use illegal drugs. Some items may interact with your medicine. What should I watch for while using this medicine? Visit your doctor or health care professional for regular checks on your progress. It may be several weeks before you see the full effects of this medicine. Do not suddenly stop taking this medicine. You may need to gradually reduce the dose. Patients and their families should watch out for worsening depression or thoughts of suicide. Also watch out for sudden changes  in feelings such as feeling anxious, agitated, panicky, irritable, hostile, aggressive, impulsive, severely restless, overly excited and hyperactive, or  not being able to sleep. If this happens, especially at the beginning of antidepressant treatment or after a change in dose, call your health care professional. Dennis Bast may get dizzy or drowsy. Do not drive, use machinery, or do anything that needs mental alertness until you know how this medicine affects you. Do not stand or sit up quickly, especially if you are an older patient. This reduces the risk of dizzy or fainting spells. Alcohol can increase dizziness and drowsiness. Avoid alcoholic drinks. This medicine can reduce the response of your body to heat or cold. Dress warm in cold weather and stay hydrated in hot weather. If possible, avoid extreme temperatures like saunas, hot tubs, very hot or cold showers, or activities that can cause dehydration such as vigorous exercise. This medicine may cause dry eyes and blurred vision. If you wear contact lenses you may feel some discomfort. Lubricating drops may help. See your eye doctor if the problem does not go away or is severe. If you notice an increased hunger or thirst, different from your normal hunger or thirst, or if you find that you have to urinate more frequently, you should contact your health care provider as soon as possible. You may need to have your blood sugar monitored. This medicine may cause changes in your blood sugar levels. You should monitor you blood sugar frequently if you have diabetes. There have been reports of uncontrollable and strong urges to gamble, binge eat, shop, and have sex while taking this medicine. If you experience any of these or other uncontrollable and strong urges while taking this medicine, you should report it to your health care provider as soon as possible. What side effects may I notice from receiving this medicine? Side effects that you should report to your doctor or health care professional as soon as possible: -allergic reactions like skin rash, itching or hives, swelling of the face, lips, or  tongue -breathing problems -confusion -feeling faint or lightheaded, falls -fever or chills, sore throat -increased hunger or thirst -increased urination -joint pain -muscles pain, spasms -problems with balance, talking, walking -restlessness or need to keep moving -seizures -suicidal thoughts or other mood changes -trouble swallowing -uncontrollable and excessive urges (examples: gambling, binge eating, shopping, having sex) -uncontrollable head, mouth, neck, arm, or leg movements -unusually weak or tired Side effects that usually do not require medical attention (report to your doctor or health care professional if they continue or are bothersome): -blurred vision -constipation -headache -nausea, vomiting -trouble sleeping -weight gain This list may not describe all possible side effects. Call your doctor for medical advice about side effects. You may report side effects to FDA at 1-800-FDA-1088. Where should I keep my medicine? Keep out of the reach of children. Store at room temperature between 15 and 30 degrees C (59 and 86 degrees F). Throw away any unused medicine after the expiration date. NOTE: This sheet is a summary. It may not cover all possible information. If you have questions about this medicine, talk to your doctor, pharmacist, or health care provider.  2018 [employer] (2016-04-18 11:45:05)

## 2017-10-18 ENCOUNTER — Encounter: Payer: Self-pay | Admitting: Adult Health

## 2017-10-18 LAB — CBC WITH DIFFERENTIAL/PLATELET
Basophils Absolute: 18 cells/uL (ref 0–200)
Basophils Relative: 0.4 %
Eosinophils Absolute: 59 cells/uL (ref 15–500)
Eosinophils Relative: 1.3 %
HCT: 35.7 % (ref 35.0–45.0)
Hemoglobin: 11.9 g/dL (ref 11.7–15.5)
Lymphs Abs: 1436 cells/uL (ref 850–3900)
MCH: 29.2 pg (ref 27.0–33.0)
MCHC: 33.3 g/dL (ref 32.0–36.0)
MCV: 87.5 fL (ref 80.0–100.0)
MPV: 9.3 fL (ref 7.5–12.5)
Monocytes Relative: 8.8 %
Neutro Abs: 2592 cells/uL (ref 1500–7800)
Neutrophils Relative %: 57.6 %
Platelets: 296 10*3/uL (ref 140–400)
RBC: 4.08 10*6/uL (ref 3.80–5.10)
RDW: 12.6 % (ref 11.0–15.0)
Total Lymphocyte: 31.9 %
WBC mixed population: 396 cells/uL (ref 200–950)
WBC: 4.5 10*3/uL (ref 3.8–10.8)

## 2017-10-18 LAB — COMPLETE METABOLIC PANEL WITH GFR
AG Ratio: 1.1 (calc) (ref 1.0–2.5)
ALT: 12 U/L (ref 6–29)
AST: 13 U/L (ref 10–35)
Albumin: 3.9 g/dL (ref 3.6–5.1)
Alkaline phosphatase (APISO): 56 U/L (ref 33–130)
BUN/Creatinine Ratio: 12 (calc) (ref 6–22)
BUN: 13 mg/dL (ref 7–25)
CO2: 28 mmol/L (ref 20–32)
Calcium: 9.4 mg/dL (ref 8.6–10.4)
Chloride: 105 mmol/L (ref 98–110)
Creat: 1.1 mg/dL — ABNORMAL HIGH (ref 0.50–0.99)
GFR, Est African American: 62 mL/min/{1.73_m2} (ref >=60)
GFR, Est Non African American: 54 mL/min/{1.73_m2} — ABNORMAL LOW (ref >=60)
Globulin: 3.6 g/dL (calc) (ref 1.9–3.7)
Glucose, Bld: 106 mg/dL — ABNORMAL HIGH (ref 65–99)
Potassium: 4.3 mmol/L (ref 3.5–5.3)
Sodium: 140 mmol/L (ref 135–146)
Total Bilirubin: 0.3 mg/dL (ref 0.2–1.2)
Total Protein: 7.5 g/dL (ref 6.1–8.1)

## 2017-10-18 LAB — LIPID PANEL
Cholesterol: 156 mg/dL (ref <200)
HDL: 34 mg/dL — ABNORMAL LOW (ref >50)
LDL Cholesterol (Calc): 101 mg/dL (calc) — ABNORMAL HIGH
Non-HDL Cholesterol (Calc): 122 mg/dL (calc) (ref <130)
Total CHOL/HDL Ratio: 4.6 (calc) (ref <5.0)
Triglycerides: 113 mg/dL (ref <150)

## 2017-10-18 LAB — HEMOGLOBIN A1C
Hgb A1c MFr Bld: 6 % of total Hgb — ABNORMAL HIGH (ref <5.7)
Mean Plasma Glucose: 126 (calc)
eAG (mmol/L): 7 (calc)

## 2017-10-18 LAB — TSH: TSH: 0.63 mIU/L (ref 0.40–4.50)

## 2017-10-19 ENCOUNTER — Other Ambulatory Visit: Payer: Self-pay | Admitting: Adult Health

## 2017-10-19 DIAGNOSIS — K629 Disease of anus and rectum, unspecified: Secondary | ICD-10-CM

## 2017-10-22 ENCOUNTER — Other Ambulatory Visit: Payer: Self-pay | Admitting: Physician Assistant

## 2017-10-24 ENCOUNTER — Encounter: Payer: Self-pay | Admitting: Internal Medicine

## 2017-11-03 ENCOUNTER — Encounter: Payer: Self-pay | Admitting: Adult Health

## 2017-11-04 ENCOUNTER — Inpatient Hospital Stay: Payer: BC Managed Care – PPO | Attending: Oncology | Admitting: Oncology

## 2017-11-04 ENCOUNTER — Telehealth: Payer: Self-pay

## 2017-11-04 VITALS — BP 158/81 | HR 76 | Temp 97.9°F | Resp 14 | Ht 62.0 in | Wt 180.1 lb

## 2017-11-04 DIAGNOSIS — I700 Atherosclerosis of aorta: Secondary | ICD-10-CM

## 2017-11-04 DIAGNOSIS — G4730 Sleep apnea, unspecified: Secondary | ICD-10-CM | POA: Insufficient documentation

## 2017-11-04 DIAGNOSIS — Z79899 Other long term (current) drug therapy: Secondary | ICD-10-CM | POA: Diagnosis not present

## 2017-11-04 DIAGNOSIS — M329 Systemic lupus erythematosus, unspecified: Secondary | ICD-10-CM

## 2017-11-04 DIAGNOSIS — R5383 Other fatigue: Secondary | ICD-10-CM | POA: Diagnosis not present

## 2017-11-04 DIAGNOSIS — F418 Other specified anxiety disorders: Secondary | ICD-10-CM | POA: Insufficient documentation

## 2017-11-04 DIAGNOSIS — C649 Malignant neoplasm of unspecified kidney, except renal pelvis: Secondary | ICD-10-CM

## 2017-11-04 DIAGNOSIS — M129 Arthropathy, unspecified: Secondary | ICD-10-CM | POA: Insufficient documentation

## 2017-11-04 DIAGNOSIS — I2699 Other pulmonary embolism without acute cor pulmonale: Secondary | ICD-10-CM | POA: Insufficient documentation

## 2017-11-04 DIAGNOSIS — I749 Embolism and thrombosis of unspecified artery: Secondary | ICD-10-CM

## 2017-11-04 DIAGNOSIS — E559 Vitamin D deficiency, unspecified: Secondary | ICD-10-CM | POA: Diagnosis not present

## 2017-11-04 DIAGNOSIS — C642 Malignant neoplasm of left kidney, except renal pelvis: Secondary | ICD-10-CM | POA: Diagnosis not present

## 2017-11-04 DIAGNOSIS — I10 Essential (primary) hypertension: Secondary | ICD-10-CM

## 2017-11-04 DIAGNOSIS — Z7901 Long term (current) use of anticoagulants: Secondary | ICD-10-CM | POA: Insufficient documentation

## 2017-11-04 NOTE — Telephone Encounter (Signed)
Printed avs and calender of upcoming appointment. Per 6/21 los

## 2017-11-04 NOTE — Progress Notes (Signed)
Reason for Referral: Pulmonary embolism  HPI: 62 year old woman currently of [place] where she lived the majority of her life.  She has history of hypertension and lupus diagnosis in 2001.  She has been on Plaquenil since that time.  She was found to have a kidney mass in April 2019 after presenting with abdominal pain.  CT scan on 08/20/2017 showed a 5.4 mass in the left kidney.  She is subsequently underwent a radical nephrectomy on Sep 19, 2017.  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  She recovered reasonably well from his operation but presented on Oct 09, 2017 with symptoms of chest pain and shortness of breath and found to have pulmonary embolism in the lower range of the left pulmonary artery.  CT evidence of right heart strain was noted consistent with a submassive PE.  CT scan of the abdomen and pelvis was also obtained on Oct 09, 2017 did not show any evidence of advanced malignancy.  She was discharged on Eliquis and has been doing well on it.  She does report some mild fatigue but no other complaints.  She has resumed work-related duties without any chest pain or dyspnea on exertion.  She denies any history of thrombosis of phlebitis in the past.  She has no family history of thrombosis.  She had 3 pregnancies that are uncomplicated and delivered via C-section.  She does not report any headaches, blurry vision, syncope or seizures. Does not report any fevers, chills or sweats.  Does not report any cough, wheezing or hemoptysis.  Does not report any chest pain, palpitation, orthopnea or leg edema.  Does not report any nausea, vomiting or abdominal pain.  Does not report any constipation or diarrhea.  Does not report any skeletal complaints.    Does not report frequency, urgency or hematuria.  Does not report any skin rashes or lesions. Does not report any heat or cold intolerance.  Does not report any lymphadenopathy or petechiae.  Does not report any anxiety or  depression.  Remaining review of systems is negative.    Past Medical History:  Diagnosis Date  . Anxiety   . Arthritis    pt denies  . Cystocele   . Depression   . Hypertension    Labile  pt. denies no meds  . Lupus ([place])   . Pre-diabetes   . Prediabetes   . Seizure ([place])   . Sleep apnea   . Vitamin D deficiency   :  Past Surgical History:  Procedure Laterality Date  . ABDOMINAL HYSTERECTOMY    . CESAREAN SECTION     x 3  . LAPAROSCOPIC NEPHRECTOMY, HAND ASSISTED Left 09/19/2017   Procedure: LEFT HAND ASSISTED LAPAROSCOPIC NEPHRECTOMY;  Surgeon: Lucas Mallow, MD;  Location: [facility];  Service: Urology;  Laterality: Left;  . laprascopic assisted nephrectomy     Left   :   Current Outpatient Medications:  .  acetaminophen (TYLENOL) 500 MG tablet, Take 1,000 mg by mouth every 6 (six) hours as needed for mild pain., Disp: , Rfl:  .  ALPRAZolam (XANAX) 0.5 MG tablet, TAKE 1 TABLET (0.5 MG TOTAL) BY MOUTH 3 (THREE) TIMES DAILY AS NEEDED FOR ANXIETY., Disp: 90 tablet, Rfl: 0 .  apixaban (ELIQUIS) 5 MG TABS tablet, Take 10 mg(2 pills) twice a day for 6 days then continue taking 5mg  ( 1 pill) twice a day (Patient taking differently: Take 5mg  twice a day by mouth), Disp: 72 tablet, Rfl: 0 .  buPROPion (WELLBUTRIN XL) 150 MG 24 hr tablet, Take 1 tablet every morning for mood., Disp: 30 tablet, Rfl: 1 .  Cholecalciferol (VITAMIN D) 2000 UNITS CAPS, Take 1 capsule by mouth daily., Disp: , Rfl:  .  escitalopram (LEXAPRO) 20 MG tablet, Take 1 tablet (20 mg total) by mouth daily., Disp: 30 tablet, Rfl: 2 .  hydroxychloroquine (PLAQUENIL) 200 MG tablet, Take 2 tablets (400 mg total) daily by mouth., Disp: 180 tablet, Rfl: 1 .  linaclotide (LINZESS) 290 MCG CAPS capsule, Take 290 mcg by mouth daily before breakfast., Disp: , Rfl:  .  Magnesium 500 MG TABS, Take 500 mg by mouth daily. , Disp: , Rfl:  .  meloxicam (MOBIC) 15 MG tablet, TAKE 1 TAB DAILY W/FOOD FOR 2 WEEKS, THEN AS NEEDED, CAN  TAKE W/TYLENOL, DON'T TAKE W/ALEVE OR IBU, Disp: 90 tablet, Rfl: 1:  Allergies  Allergen Reactions  . Bactrim [Sulfamethoxazole-Trimethoprim]     GI upset  . Cymbalta [Duloxetine Hcl]     Sweating  . Acyclovir And Related Itching  :  Family History  Problem Relation Age of Onset  . Hyperlipidemia Mother   . Hypertension Mother   . Heart disease Mother   :  Social History   Socioeconomic History  . Marital status: Single    Spouse name: Not on file  . Number of children: Not on file  . Years of education: Not on file  . Highest education level: Not on file  Occupational History  . Not on file  Social Needs  . Financial resource strain: Not on file  . Food insecurity:    Worry: Not on file    Inability: Not on file  . Transportation needs:    Medical: Not on file    Non-medical: Not on file  Tobacco Use  . Smoking status: Never Smoker  . Smokeless tobacco: Never Used  Substance and Sexual Activity  . Alcohol use: No  . Drug use: No  . Sexual activity: Not Currently  Lifestyle  . Physical activity:    Days per week: Not on file    Minutes per session: Not on file  . Stress: Not on file  Relationships  . Social connections:    Talks on phone: Not on file    Gets together: Not on file    Attends religious service: Not on file    Active member of club or organization: Not on file    Attends meetings of clubs or organizations: Not on file    Relationship status: Not on file  . Intimate partner violence:    Fear of current or ex partner: Not on file    Emotionally abused: Not on file    Physically abused: Not on file    Forced sexual activity: Not on file  Other Topics Concern  . Not on file  Social History Narrative  . Not on file  :  Pertinent items are noted in HPI.  Exam: Blood pressure (!) 158/81, pulse 76, temperature 97.9 F (36.6 C), temperature source Oral, resp. rate 14, height 5\' 2"  (1.575 m), weight 180 lb 1.6 oz (81.7 kg), SpO2 99 %.  ECOG  0 General appearance: alert and cooperative appeared without distress. Head: atraumatic without any abnormalities. Eyes: conjunctivae/corneas clear. PERRL.  Sclera anicteric. Throat: lips, mucosa, and tongue normal; without oral thrush or ulcers. Resp: clear to auscultation bilaterally without rhonchi, wheezes or dullness to percussion. Cardio: regular rate and rhythm, S1, S2 normal, no murmur, click, rub  or gallop GI: soft, non-tender; bowel sounds normal; no masses,  no organomegaly Skin: Skin color, texture, turgor normal. No rashes or lesions Lymph nodes: Cervical, supraclavicular, and axillary nodes normal. Neurologic: Grossly normal without any motor, sensory or deep tendon reflexes. Musculoskeletal: No joint deformity or effusion.  CBC    Component Value Date/Time   WBC 4.5 10/17/2017 0947   RBC 4.08 10/17/2017 0947   HGB 11.9 10/17/2017 0947   HCT 35.7 10/17/2017 0947   PLT 296 10/17/2017 0947   MCV 87.5 10/17/2017 0947   MCH 29.2 10/17/2017 0947   MCHC 33.3 10/17/2017 0947   RDW 12.6 10/17/2017 0947   LYMPHSABS 1,436 10/17/2017 0947   MONOABS 520 12/23/2016 0928   EOSABS 59 10/17/2017 0947   BASOSABS 18 10/17/2017 0947     Chemistry      Component Value Date/Time   NA 140 10/17/2017 0947   K 4.3 10/17/2017 0947   CL 105 10/17/2017 0947   CO2 28 10/17/2017 0947   BUN 13 10/17/2017 0947   CREATININE 1.10 (H) 10/17/2017 0947      Component Value Date/Time   CALCIUM 9.4 10/17/2017 0947   ALKPHOS 52 10/09/2017 0947   AST 13 10/17/2017 0947   ALT 12 10/17/2017 0947   BILITOT 0.3 10/17/2017 0947       Ct Angio Chest Pe W/cm &/or Wo Cm  Result Date: 10/09/2017 CLINICAL DATA:  Acute left-sided abdominal pain. EXAM: CT ANGIOGRAPHY CHEST CT ABDOMEN AND PELVIS WITH CONTRAST TECHNIQUE: Multidetector CT imaging of the chest was performed using the standard protocol during bolus administration of intravenous contrast. Multiplanar CT image reconstructions and MIPs were  obtained to evaluate the vascular anatomy. Multidetector CT imaging of the abdomen and pelvis was performed using the standard protocol during bolus administration of intravenous contrast. CONTRAST:  69mL ISOVUE-370 IOPAMIDOL (ISOVUE-370) INJECTION 76% COMPARISON:  CT scans of August 29, 2017 and August 20, 2017. FINDINGS: CTA CHEST FINDINGS Cardiovascular: Filling defect is noted in lower lobe branch of left pulmonary artery consistent with acute pulmonary embolus. RV/LV ratio of 1.0 is noted suggesting right heart strain. There is no evidence of thoracic aortic dissection or aneurysm. No pericardial effusion is noted. Mediastinum/Nodes: No enlarged mediastinal, hilar, or axillary lymph nodes. Thyroid gland, trachea, and esophagus demonstrate no significant findings. Lungs/Pleura: No pneumothorax is noted. Right lung is clear. Left lower lobe atelectasis or infiltrate is noted. Musculoskeletal: No chest wall abnormality. No acute or significant osseous findings. Review of the MIP images confirms the above findings. CT ABDOMEN and PELVIS FINDINGS Hepatobiliary: No gallstones are noted. 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 No biliary dilatation is noted. Pancreas: Unremarkable. No pancreatic ductal dilatation or surrounding inflammatory changes. Spleen: Normal in size without focal abnormality. Adrenals/Urinary Tract: Adrenal glands appear normal. Status post left nephrectomy. Small right renal cyst is noted. No hydronephrosis or renal obstruction is noted. Urinary bladder is unremarkable. Stomach/Bowel: Stomach is within normal limits. Appendix appears normal. No evidence of bowel wall thickening, distention, or inflammatory changes. Vascular/Lymphatic: Aortic atherosclerosis. No enlarged abdominal or pelvic lymph nodes. Reproductive: Status post hysterectomy. No adnexal masses. Other: No abdominal wall hernia or abnormality. No abdominopelvic ascites. Musculoskeletal: No  acute or significant osseous findings. Review of the MIP images confirms the above findings. IMPRESSION: Pulmonary embolus seen in lower lobe branch of left pulmonary artery. Positive for acute PE with CT evidence of right heart strain (RV/LV Ratio = 1.0) consistent with at least submassive (intermediate  risk) PE. The presence of right heart strain has been associated with an increased risk of morbidity and mortality. Please activate Code PE by paging [phone]. Critical Value/emergent results were called by telephone at the time of interpretation on 10/09/2017 at 1:08 pm to Dr. Quintella Reichert , who verbally acknowledged these results. Status post left nephrectomy. No acute abnormality seen in the abdomen or pelvis. Aortic Atherosclerosis (ICD10-I70.0). Electronically Signed   By: Marijo Conception, M.D.   On: 10/09/2017 13:10   Ct Abdomen Pelvis W Contrast  Result Date: 10/09/2017 CLINICAL DATA:  Acute left-sided abdominal pain. EXAM: CT ANGIOGRAPHY CHEST CT ABDOMEN AND PELVIS WITH CONTRAST TECHNIQUE: Multidetector CT imaging of the chest was performed using the standard protocol during bolus administration of intravenous contrast. Multiplanar CT image reconstructions and MIPs were obtained to evaluate the vascular anatomy. Multidetector CT imaging of the abdomen and pelvis was performed using the standard protocol during bolus administration of intravenous contrast. CONTRAST:  63mL ISOVUE-370 IOPAMIDOL (ISOVUE-370) INJECTION 76% COMPARISON:  CT scans of August 29, 2017 and August 20, 2017. FINDINGS: CTA CHEST FINDINGS Cardiovascular: Filling defect is noted in lower lobe branch of left pulmonary artery consistent with acute pulmonary embolus. RV/LV ratio of 1.0 is noted suggesting right heart strain. There is no evidence of thoracic aortic dissection or aneurysm. No pericardial effusion is noted. Mediastinum/Nodes: No enlarged mediastinal, hilar, or axillary lymph nodes. Thyroid gland, trachea, and esophagus  demonstrate no significant findings. Lungs/Pleura: No pneumothorax is noted. Right lung is clear. Left lower lobe atelectasis or infiltrate is noted. Musculoskeletal: No chest wall abnormality. No acute or significant osseous findings. Review of the MIP images confirms the above findings. CT ABDOMEN and PELVIS FINDINGS Hepatobiliary: No gallstones are noted. 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 No biliary dilatation is noted. Pancreas: Unremarkable. No pancreatic ductal dilatation or surrounding inflammatory changes. Spleen: Normal in size without focal abnormality. Adrenals/Urinary Tract: Adrenal glands appear normal. Status post left nephrectomy. Small right renal cyst is noted. No hydronephrosis or renal obstruction is noted. Urinary bladder is unremarkable. Stomach/Bowel: Stomach is within normal limits. Appendix appears normal. No evidence of bowel wall thickening, distention, or inflammatory changes. Vascular/Lymphatic: Aortic atherosclerosis. No enlarged abdominal or pelvic lymph nodes. Reproductive: Status post hysterectomy. No adnexal masses. Other: No abdominal wall hernia or abnormality. No abdominopelvic ascites. Musculoskeletal: No acute or significant osseous findings. Review of the MIP images confirms the above findings. IMPRESSION: Pulmonary embolus seen in lower lobe branch of left pulmonary artery. Positive for acute PE with CT evidence of right heart strain (RV/LV Ratio = 1.0) consistent with at least submassive (intermediate risk) PE. The presence of right heart strain has been associated with an increased risk of morbidity and mortality. Please activate Code PE by paging [phone]. Critical Value/emergent results were called by telephone at the time of interpretation on 10/09/2017 at 1:08 pm to Dr. Quintella Reichert , who verbally acknowledged these results. Status post left nephrectomy. No acute abnormality seen in the abdomen or pelvis. Aortic  Atherosclerosis (ICD10-I70.0). Electronically Signed   By: Marijo Conception, M.D.   On: 10/09/2017 13:10    Assessment and Plan:   62 year old woman with the following:  1.  The pulmonary embolism diagnosed in May 2019.  This occurred in the setting of a recent abdominal surgery and a radical nephrectomy that was performed on Sep 19, 2017.  Her hypercoagulable panel was reviewed today and showed no evidence of lupus anticoagulant or other  inherited thrombophilia.  The natural course of acquired and inherited thrombosis as well as risk of relapse was reviewed today in detail.  Risk factors for her including history of lupus, obesity and recent surgery.  Her hypercoagulable panel as mentioned is unremarkable.  She has no personal or family history with thrombosis or pregnancy complications.  Based on these factors, I have recommended anticoagulation for at least 6 months given the severity of her pulmonary embolism.  After that risk and benefit discussion we will pursue with her longer anticoagulation will be needed.  Given the fact that this is a potentially a provoked process then discontinuation of anticoagulation would be reasonable.  2.  Chromophobe kidney tumor: Status post resection without any additional residual tumor at this time.  No further therapy is needed for that standpoint.  30  minutes was spent with the patient face-to-face today.  More than 50% of time was dedicated to patient counseling, education and coordination of her care.

## 2017-11-10 ENCOUNTER — Encounter: Payer: Self-pay | Admitting: Adult Health

## 2017-11-10 ENCOUNTER — Other Ambulatory Visit: Payer: Self-pay | Admitting: Adult Health

## 2017-11-10 MED ORDER — APIXABAN 5 MG PO TABS: ORAL_TABLET | ORAL | 5 refills | Status: DC

## 2017-11-12 ENCOUNTER — Encounter (HOSPITAL_COMMUNITY): Payer: Self-pay | Admitting: Emergency Medicine

## 2017-11-12 ENCOUNTER — Emergency Department (HOSPITAL_COMMUNITY)
Admission: EM | Admit: 2017-11-12 | Discharge: 2017-11-12 | Disposition: A | Discharge status: Home / Self Care | Payer: BC Managed Care – PPO | Attending: Emergency Medicine | Admitting: Emergency Medicine

## 2017-11-12 DIAGNOSIS — C642 Malignant neoplasm of left kidney, except renal pelvis: Secondary | ICD-10-CM | POA: Diagnosis not present

## 2017-11-12 DIAGNOSIS — Z79899 Other long term (current) drug therapy: Secondary | ICD-10-CM | POA: Diagnosis not present

## 2017-11-12 DIAGNOSIS — K6289 Other specified diseases of anus and rectum: Secondary | ICD-10-CM | POA: Insufficient documentation

## 2017-11-12 DIAGNOSIS — Z905 Acquired absence of kidney: Secondary | ICD-10-CM | POA: Insufficient documentation

## 2017-11-12 DIAGNOSIS — Z7901 Long term (current) use of anticoagulants: Secondary | ICD-10-CM | POA: Diagnosis not present

## 2017-11-12 DIAGNOSIS — I10 Essential (primary) hypertension: Secondary | ICD-10-CM | POA: Insufficient documentation

## 2017-11-12 MED ORDER — DILTIAZEM GEL 2 %: CUTANEOUS | 0 refills | Status: DC

## 2017-11-12 NOTE — ED Provider Notes (Signed)
[facility] DEPT Provider Note   CSN: [id number] Arrival date & time: 11/12/17  1038     History   Chief Complaint Chief Complaint  Patient presents with  . Rectal Pain    HPI Beth [name] is a 62 y.o. female who was recently diagnosed with renal cancer and surgically underwent nephrectomy and unfortunately developed a PE is presenting to the emergency department today for rectal pain.  Patient notes this is been ongoing for several months now.  She reports that initially began 2.5-3 months ago.  She reports that she was seen by a GI specialist for this where she had a colonoscopy that was negative.  She denies any internal hemorrhoids reported.  She notes that her pain began after a bout of constipation when she had to pass several large bowel movement.  She notes she has had increasing constipation that is currently being managed with magnesium citrate as well as stool softeners and MiraLAX.  She reports that anytime she has a bowel movement she had a tearing-like pain in her anus that lasts for several minutes. She rates that pain as a 10/10. She notes that she has not tried any topical cream for this.  She does have a follow-up with gastroenterology on the 18th to be evaluated for this.  She denies any narcotic pain medication use.  She denies any fever, chills, abdominal pain, nausea/vomiting/diarrhea, urinary symptoms, not hematochezia.  HPI  Past Medical History:  Diagnosis Date  . Anxiety   . Arthritis    pt denies  . Cystocele   . Depression   . Hypertension    Labile  pt. denies no meds  . Lupus ([place])   . Pre-diabetes   . Prediabetes   . Seizure ([place])   . Sleep apnea   . Vitamin D deficiency     Patient Active Problem List   Diagnosis Date Noted  . Pulmonary embolism ([place]) 10/09/2017  . Renal cell carcinoma ([place]) 10/09/2017  . H/O left nephrectomy 10/09/2017  . Depression 10/09/2017  . Anxiety 10/09/2017  . OSA (obstructive sleep  apnea) 01/24/2015  . RLS (restless legs syndrome) 01/24/2015  . Obesity (BMI 30.0-34.9) 05/28/2014  . Stress incontinence 05/28/2014  . Medication management 05/28/2014  . Hypertension   . Prediabetes   . Hyperlipidemia   . Vitamin D deficiency   . Depression, major, recurrent ([place]) 07/14/2006  . Environmental allergies 07/14/2006  . Constipation 07/14/2006  . SLE 07/14/2006    Past Surgical History:  Procedure Laterality Date  . ABDOMINAL HYSTERECTOMY    . CESAREAN SECTION     x 3  . LAPAROSCOPIC NEPHRECTOMY, HAND ASSISTED Left 09/19/2017   Procedure: LEFT HAND ASSISTED LAPAROSCOPIC NEPHRECTOMY;  Surgeon: Lucas Mallow, MD;  Location: [facility];  Service: Urology;  Laterality: Left;  . laprascopic assisted nephrectomy     Left      OB History   None      Home Medications    Prior to Admission medications   Medication Sig Start Date End Date Taking? Authorizing Provider  acetaminophen (TYLENOL) 500 MG tablet Take 1,000 mg by mouth every 6 (six) hours as needed for mild pain.    [provider]  ALPRAZolam (XANAX) 0.5 MG tablet TAKE 1 TABLET (0.5 MG TOTAL) BY MOUTH 3 (THREE) TIMES DAILY AS NEEDED FOR ANXIETY. 10/23/17   [name], NP  apixaban (ELIQUIS) 5 MG TABS tablet Take 5mg  twice a day by mouth 11/10/17   [name],  NP  buPROPion (WELLBUTRIN XL) 150 MG 24 hr tablet Take 1 tablet every morning for mood. 10/17/17   [name], NP  Cholecalciferol (VITAMIN D) 2000 UNITS CAPS Take 1 capsule by mouth daily.    [provider]  escitalopram (LEXAPRO) 20 MG tablet Take 1 tablet (20 mg total) by mouth daily. 08/01/17 08/01/18  Vicie Mutters, PA-C  hydroxychloroquine (PLAQUENIL) 200 MG tablet Take 2 tablets (400 mg total) daily by mouth. 03/23/17   [name], MD  linaclotide [facility]) 290 MCG CAPS capsule Take 290 mcg by mouth daily before breakfast.    [provider]  Magnesium 500 MG TABS Take 500 mg by mouth daily.     [provider]  meloxicam (MOBIC) 15 MG tablet TAKE 1 TAB DAILY W/FOOD FOR 2 WEEKS, THEN AS NEEDED, CAN TAKE W/TYLENOL, DON'T TAKE W/ALEVE OR IBU 10/03/17   [name], NP    Family History Family History  Problem Relation Age of Onset  . Hyperlipidemia Mother   . Hypertension Mother   . Heart disease Mother     Social History Social History   Tobacco Use  . Smoking status: Never Smoker  . Smokeless tobacco: Never Used  Substance Use Topics  . Alcohol use: No  . Drug use: No     Allergies   Bactrim [sulfamethoxazole-trimethoprim]; Cymbalta [duloxetine hcl]; and Acyclovir and related   Review of Systems Review of Systems  All other systems reviewed and are negative.    Physical Exam Updated Vital Signs BP (!) 182/94 (BP Location: Left Arm)   Pulse 75   Temp 98.2 F (36.8 C) (Oral)   Resp 18   SpO2 100%   Physical Exam  Constitutional: She appears well-developed and well-nourished.  HENT:  Head: Normocephalic and atraumatic.  Right Ear: External ear normal.  Left Ear: External ear normal.  Nose: Nose normal.  Mouth/Throat: Uvula is midline, oropharynx is clear and moist and mucous membranes are normal. No tonsillar exudate.  Eyes: Pupils are equal, round, and reactive to light. Right eye exhibits no discharge. Left eye exhibits no discharge. No scleral icterus.  Neck: Trachea normal. Neck supple. No spinous process tenderness present. No neck rigidity. Normal range of motion present.  Cardiovascular: Normal rate, regular rhythm and intact distal pulses.  No murmur heard. Pulses:      Radial pulses are 2+ on the right side, and 2+ on the left side.       Dorsalis pedis pulses are 2+ on the right side, and 2+ on the left side.       Posterior tibial pulses are 2+ on the right side, and 2+ on the left side.  No lower extremity swelling or edema. Calves symmetric in size bilaterally.  Pulmonary/Chest: Effort normal and breath sounds normal. She exhibits no  tenderness.  Abdominal: Soft. Bowel sounds are normal. She exhibits no distension. There is no tenderness. There is no rigidity, no rebound, no guarding and no CVA tenderness.  Genitourinary:  Genitourinary Comments: Chaperone was present.  Patient with pain on palpation on the midline posteriorly. Perianal sensory intact.  Possible external fissure's palpated/examined on posterior midline. External hemorrhoids noted without evidence of thrombosis. No induration of the skin or swelling. Digital Rectal Exam reveals sphincter with good tone. No masses palpated. Stool color is brown with no overt blood or melena.  Musculoskeletal: She exhibits no edema.  Lymphadenopathy:    She has no cervical adenopathy.  Neurological: She is alert.  Skin: Skin is warm  and dry. No rash noted. She is not diaphoretic.  Psychiatric: She has a normal mood and affect.  Nursing note and vitals reviewed.    ED Treatments / Results  Labs (all labs ordered are listed, but only abnormal results are displayed) Labs Reviewed - No data to display  EKG None  Radiology No results found.  Procedures Procedures (including critical care time)  Medications Ordered in ED Medications - No data to display   Initial Impression / Assessment and Plan / ED Course  I have reviewed the triage vital signs and the nursing notes.  Pertinent labs & imaging results that were available during my care of the patient were reviewed by me and considered in my medical decision making (see chart for details).     62 y.o. female who has had a complicated medical history of the last several months presenting with rectal pain is been ongoing for some time.  Patient notes that pain began after episode of constipation and a large bowel movement.  She reports that she is currently trying to treat her constipation with stool softeners, MiraLAX and mag citrate but notes every time she use the restroom she has a tearing pain when trying to pass  a bowel movement.  She denies any melena or hematochezia.  She has had a colonoscopy reportedly since the onset of her symptoms that did not show any abnormalities.  On exam patient does have external hemorrhoids that do not appear thrombosed.  Patient does have pain on the posterior midline as well as what appears to be a fissure.  Her vital signs are reassuring and she is denying any abdominal pain, nausea/vomiting/diarrhea or urinary symptoms.  I do not feel patient needs any lab work or imaging at this current time.  Recommended sitz bath, continuing treatment of constipation and will prescribe diltiazem gel.  Recommend getting filled at [facility] where he can have the medication compounded.  Discussed that this can take up to 8 weeks of treatment.  Will give referral to [facility] surgery if symptoms are not improving after 2-3 weeks or are worsening. She has a appointment with her gastroenterologist on the 18th that I recommend her keep.  Discussed avoiding narcotic prescriptions.  The evaluation does not show pathology that would require ongoing emergent intervention or inpatient treatment. I advised the patient to return to the emergency department with new or worsening symptoms or new concerns. Specific return precautions discussed. The patient verbalized understanding and agreement with plan. All questions answered. No further questions at this time. The patient is hemodynamically stable, mentating appropriately and appears safe for discharge.  Final Clinical Impressions(s) / ED Diagnoses   Final diagnoses:  Anal or rectal pain    ED Discharge Orders        Ordered    diltiazem 2 % GEL     11/12/17 1337       Lorelle Gibbs 11/12/17 1349    Hayden Rasmussen, MD 11/12/17 1859

## 2017-11-12 NOTE — ED Triage Notes (Signed)
Patient here from home with complaints of rectal pain x2 months. States that she does not know why she has rectal pain, does not know if she has hemorrhoids or not. Denies bleeding. Denies constipation.

## 2017-11-12 NOTE — Discharge Instructions (Addendum)
I am prescribing you topical diltiazem for your fissure. This medication can be compounded at [facility]. This can require several weeks to months of treatment before your symptoms improve. After several weeks if you symptoms have not improved I would like you to [facility] [place] [facility] for follow up. In the mean time, keep your appointment with your GI doctor on the 18th.  Please continue your home miralax & stool softner for constipation.  Follow attached handout.  I recommend stiz baths after using the restroom. Please see attached handout on this.  If you develop worsening or new concerning symptoms you can return to the emergency department for re-evaluation.  Return if you develop any fever, nausea or vomiting, abdominal pain, bloody stools, black tarry stools or worsening of your symptoms.  [email or web address] [place], [place], [address] [address]  ~2.6 mi [phone]

## 2017-11-15 ENCOUNTER — Other Ambulatory Visit: Payer: Self-pay | Admitting: Internal Medicine

## 2017-11-22 ENCOUNTER — Other Ambulatory Visit: Payer: Self-pay | Admitting: Adult Health

## 2017-12-01 ENCOUNTER — Encounter: Payer: Self-pay | Admitting: Internal Medicine

## 2017-12-01 ENCOUNTER — Ambulatory Visit (INDEPENDENT_AMBULATORY_CARE_PROVIDER_SITE_OTHER): Payer: BC Managed Care – PPO | Admitting: Internal Medicine

## 2017-12-01 VITALS — BP 122/70 | HR 74 | Ht 61.5 in | Wt 180.1 lb

## 2017-12-01 DIAGNOSIS — N816 Rectocele: Secondary | ICD-10-CM | POA: Diagnosis not present

## 2017-12-01 DIAGNOSIS — K5909 Other constipation: Secondary | ICD-10-CM | POA: Diagnosis not present

## 2017-12-01 DIAGNOSIS — Z7901 Long term (current) use of anticoagulants: Secondary | ICD-10-CM

## 2017-12-01 DIAGNOSIS — K469 Unspecified abdominal hernia without obstruction or gangrene: Secondary | ICD-10-CM | POA: Diagnosis not present

## 2017-12-01 DIAGNOSIS — N393 Stress incontinence (female) (male): Secondary | ICD-10-CM | POA: Diagnosis not present

## 2017-12-01 DIAGNOSIS — M329 Systemic lupus erythematosus, unspecified: Secondary | ICD-10-CM

## 2017-12-01 NOTE — Patient Instructions (Signed)
  We will get your records and be in touch.   I appreciate the opportunity to care for you. Silvano Rusk, MD, [facility]

## 2017-12-01 NOTE — Progress Notes (Signed)
Beth [name] 62 y.o. [birth year] [id number] Referred by: Beth Comber, NP  Assessment & Plan:   Encounter Diagnoses  Name Primary?  . Rectocele Yes  . Enterocele   . Chronic constipation   . SUI (stress urinary incontinence, female)   . Long term current use of anticoagulant - Eliquis PE   . Lupus (Beth [name])    Pelvic floor problems are the issue here She has an enterocele and a rectocele at least back to 2008. The repair of an enterocele is different than rectocele. I think needs more imaging - wll order MR defecography.  Likely a candidate for surgical repair Doubt pelvic PT would suffice but may be needed post-op? 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  Await GYN input also  Do not think colonoscopy needed at this time  PE and anticoagulation would most likely delay any type of surgery which is elective.   I appreciate the opportunity to care for you. Beth Parker, NP Beth Gentile, MD   Subjective:   Chief Complaint: rectal pain, constipation, hemorrhoids  HPI The patient is here at the request of Beth Comber, NP because of rectal pain and ? Hemorrhoids. She had seen Beth [name] in the past for (records reviewed)  ? Of rectal prolapse with constipation issues. He did  Not find prolapse on exam and recommended MiraLAx. He subsequently noted that she had an anorectal manometry at [facility] that showed pelvic floor dysfx.  and had  10/2013 colonoscopy [facility] excespt small internal hemorrhoids (same as 2005 - [facility]  Also Care everywehere notes show that in 2008 she had defecography with small enterocele and rectocele. Also slight increase in anal rectal angle and decreased attenuation of the puborectalis impression. She had seen Beth [name].   Then this year with these chronic sxs of rectal bulge went to [facility] and during that evaluatuion for GI sxs had an [place] for eval of L abd pain and then ended  up with a CT scan after ED visit that showed renal cell carcinoma. Had a left nephrectomy (lap-assisted) by Beth [name] and has felt better re: L abd pain. Saw GYN and patient tells me she has a bulge - sxs are like a ball in rectum all the time. Chart review shows cystocele on hx/problem list. Using laxatives and stool softeners to help defecation, has some fecal smearing in between bowel movements. No rectal bleeding. Some black stools but assoc w/ Pepto Bismol use. She does c/o rectal pain pre-, during and post defecation. She take Linzess 290 Ug for constipation also.  Other sxs are bloating, some loss of appetite and gas.  She does have stress urinary incontinence sxs - laughing, sneezing and coughing.  She tells me she has been referred w/in Beth [name] Beth [name] to Beth [name] to be evaluated and considered for surgical repair of her problems.    Wt Readings from Last 3 Encounters:  12/01/17 180 lb 2 oz (81.7 kg)  11/04/17 180 lb 1.6 oz (81.7 kg)  10/17/17 181 lb (82.1 kg)    Allergies  Allergen Reactions  . Bactrim [Sulfamethoxazole-Trimethoprim]     GI upset  . Cymbalta [Duloxetine Hcl]     Sweating  . Acyclovir And Related Itching   Current Meds  Medication Sig  . acetaminophen (TYLENOL) 500 MG tablet Take 1,000 mg by mouth every 6 (six) hours as needed for mild pain.  Marland Kitchen ALPRAZolam (XANAX) 0.5 MG tablet TAKE 1 TABLET (0.5  MG TOTAL) BY MOUTH 3 (THREE) TIMES DAILY AS NEEDED FOR ANXIETY.  Marland Kitchen apixaban (ELIQUIS) 5 MG TABS tablet Take 5mg  twice a day by mouth  . buPROPion (WELLBUTRIN XL) 150 MG 24 hr tablet Take 1 tablet every morning for mood.  . Cholecalciferol (VITAMIN D) 2000 UNITS CAPS Take 1 capsule by mouth daily.  Beth [name] Calcium (STOOL SOFTENER PO) Take 1 tablet by mouth daily.  Marland Kitchen escitalopram (LEXAPRO) 20 MG tablet Take 1 tablet (20 mg total) by mouth daily.  . hydroxychloroquine (PLAQUENIL) 200 MG tablet TAKE 2 TABLETS (400 MG TOTAL) DAILY BY MOUTH.  . linaclotide (LINZESS)  290 MCG CAPS capsule Take 290 mcg by mouth daily as needed (constipation).   . Magnesium 500 MG TABS Take 500 mg by mouth daily.   . meloxicam (MOBIC) 15 MG tablet TAKE 1 TAB DAILY W/FOOD FOR 2 WEEKS, THEN AS NEEDED, CAN TAKE W/TYLENOL, DON'T TAKE W/ALEVE OR IBU   Past Medical History:  Diagnosis Date  . Anxiety   . Arthritis    pt denies  . Cancer of kidney (Beth [name])    left kidney removed  . Chronic constipation 07/14/2006   Qualifier: Diagnosis of  By: Drucie Ip    . Cystocele   . Depression   . Enterocele   . Hypertension    Labile  pt. denies no meds  . Lupus ([place])   . Prediabetes   . Pulmonary embolism ([place]) 10/09/2017  . Renal cell carcinoma ([place]) 10/09/2017  . RLS (restless legs syndrome) 01/24/2015   Per sleep study 05/2014   . Seizure ([place])   . Sleep apnea   . Vitamin D deficiency    Past Surgical History:  Procedure Laterality Date  . ABDOMINAL HYSTERECTOMY    . CESAREAN SECTION     x 3  . COLONOSCOPY     negative per pt, Beth [name] - [facility]  . 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   Review of Systems As per HPI, depressed mood, fatiguq, anxious mood, insomnia, itching. All other ROS neg  Objective:   Physical Exam @BP  122/70   Pulse 74   Ht 5' 1.5" (1.562 m)   Wt 180 lb 2 oz (81.7 kg)   BMI 33.48 kg/m @  General:  Well-developed, well-nourished and in no acute distress Eyes:  anicteric. ENT:   Mouth and posterior pharynx free of lesions.  Neck:   supple w/o thyromegaly or mass.  Lungs: Clear to auscultation bilaterally. Heart:  S1S2, no rubs, murmurs,  gallops. Abdomen:  soft, non-tender, no hepatosplenomegaly, hernia, or mass and BS+.  Rectal:  Beth [name], [place] present.   Anoderm inspection revealed small tags Anal wink was absent Digital exam revealed slightly decreased resting tone and voluntary squeeze. Moderate rectocele no mass Simulated defecation with valsalva revealed appropriate abdominal contraction and descent.   Anoscopy  Gr 1 internal external hemorrhoids  Lymph:  no cervical or supraclavicular adenopathy. Extremities:   no edema, cyanosis or clubbing Skin   no rash. Neuro:  A&O x 3.  Psych:  appropriate mood and  Affect.   Data  Reviewed: As per HPI Have reviewed Labs, PCP notes and have requested GYN notes Also reviewed imaging, ED and hospital notes from 2019

## 2017-12-04 ENCOUNTER — Encounter: Payer: Self-pay | Admitting: Internal Medicine

## 2017-12-04 DIAGNOSIS — D6869 Other thrombophilia: Secondary | ICD-10-CM | POA: Insufficient documentation

## 2017-12-04 DIAGNOSIS — N816 Rectocele: Secondary | ICD-10-CM | POA: Insufficient documentation

## 2017-12-04 DIAGNOSIS — Z7901 Long term (current) use of anticoagulants: Secondary | ICD-10-CM | POA: Insufficient documentation

## 2017-12-05 ENCOUNTER — Telehealth: Payer: Self-pay

## 2017-12-05 DIAGNOSIS — K469 Unspecified abdominal hernia without obstruction or gangrene: Secondary | ICD-10-CM

## 2017-12-05 DIAGNOSIS — N816 Rectocele: Secondary | ICD-10-CM

## 2017-12-05 NOTE — Telephone Encounter (Signed)
[facility] does do it I called radiologist  Order  MR pelvis no contrast  In comments write for defecography Attn Dr. Kris Hartmann

## 2017-12-05 NOTE — Telephone Encounter (Signed)
-----   Message from Gatha Mayer, MD sent at 12/04/2017  7:01 PM EDT ----- Regarding: needs MR defecography Please call her and explain that I found old records ([facility] 2008)  that she also has an enterocele - small bowel pushing into vagina and a rectocele. This is unusual - fixable - but I want her to do MR defecography to evaluate further - want to make sure she gets right tx  Dx rectocele, enterocele  Thanks

## 2017-12-05 NOTE — Telephone Encounter (Signed)
Sorry that should have read do not perform.  [facility] does defography by [facility] only

## 2017-12-05 NOTE — Telephone Encounter (Signed)
[facility], [facility], [facility], [facility], or [facility] imaging performs MR defography.  Next option?

## 2017-12-05 NOTE — Telephone Encounter (Signed)
[facility]

## 2017-12-06 NOTE — Telephone Encounter (Signed)
Left message for patient to call back  

## 2017-12-06 NOTE — Telephone Encounter (Signed)
Patient notified of recommendations.  She verbalized understanding, but would like to speak with her GYN prior to proceeding with the MRI.  She will call me back on Monday after her GYN consult.

## 2017-12-12 NOTE — Telephone Encounter (Signed)
Patient states she saw [facility] on [address] and she did not see what her coworker saw. Patient still wants to have MRI with Dr.Gessner.

## 2017-12-13 NOTE — Telephone Encounter (Signed)
I spoke with the patient and [facility].  They are going to contact her directly with an appt.  She is aware.

## 2017-12-15 ENCOUNTER — Other Ambulatory Visit: Payer: Self-pay | Admitting: Adult Health

## 2017-12-15 ENCOUNTER — Encounter: Payer: Self-pay | Admitting: Adult Health

## 2017-12-15 MED ORDER — FLUCONAZOLE 150 MG PO TABS: 150.0000 mg | ORAL_TABLET | Freq: Once | ORAL | 1 refills | Status: AC

## 2017-12-15 NOTE — Telephone Encounter (Signed)
Patient has been scheduled for 12/26/17

## 2017-12-23 ENCOUNTER — Encounter: Payer: Self-pay | Admitting: Physician Assistant

## 2017-12-26 ENCOUNTER — Ambulatory Visit
Admission: RE | Admit: 2017-12-26 | Discharge: 2017-12-26 | Disposition: A | Discharge status: Home / Self Care | Payer: BC Managed Care – PPO | Source: Ambulatory Visit | Attending: Internal Medicine | Admitting: Internal Medicine

## 2017-12-26 DIAGNOSIS — N816 Rectocele: Secondary | ICD-10-CM

## 2017-12-26 DIAGNOSIS — K469 Unspecified abdominal hernia without obstruction or gangrene: Secondary | ICD-10-CM

## 2017-12-27 NOTE — Progress Notes (Signed)
Please let patient know good news here  Moderate rectocele - rectum bulges into vagina Small cystocele - bladder bulges into vagina Urethra moves more than it should   No other problems as suspected in past I.e. She does not have an enterocele which is small intestine pushing into pelvic organs so repair is not as complicated.  I am ccing her ger GYN Dr. Benjie Karvonen but she is also going to see a urogunecologist - Dr. Zigmund Daniel at [facility] - they should be able to see the results but I suggest that Beth [name] get a copy of MR disc to take to that visit.  Let me know if she has any ?  I do not plan on any other testing or treatment at this time

## 2017-12-28 ENCOUNTER — Telehealth: Payer: Self-pay | Admitting: Internal Medicine

## 2017-12-28 NOTE — Telephone Encounter (Signed)
I spoke with Dr Carlean Purl and he states that Dr Gardiner Coins office is arranging the referral to Dr Zigmund Daniel at [facility].  Patient was informed of this and was asked to reach out to that office.

## 2017-12-28 NOTE — Telephone Encounter (Signed)
Pt said that she was speaking with Pam and she has a question regarding MRI results and MD that she has to contact. Pls call her.

## 2017-12-31 ENCOUNTER — Other Ambulatory Visit: Payer: Self-pay | Admitting: Adult Health

## 2018-01-02 ENCOUNTER — Other Ambulatory Visit: Payer: Self-pay | Admitting: Internal Medicine

## 2018-01-03 ENCOUNTER — Other Ambulatory Visit: Payer: Self-pay | Admitting: Internal Medicine

## 2018-01-27 ENCOUNTER — Other Ambulatory Visit: Payer: Self-pay | Admitting: Adult Health

## 2018-01-30 NOTE — Progress Notes (Signed)
Complete Physical  Assessment and Plan:  Essential hypertension - continue medications, DASH diet, exercise and monitor at home. Call if greater than 130/80.  -     CBC with Differential/Platelet -     COMPLETE METABOLIC PANEL WITH GFR -     TSH -     Urinalysis, Routine w reflex microscopic -     Microalbumin / creatinine urine ratio  OSA (obstructive sleep apnea) Sleep apnea- continue CPAP, CPAP is helping with daytime fatigue, weight loss still advised.   Renal cell carcinoma, unspecified laterality ([place]) S/p nephrectomy and doing well  Systemic lupus erythematosus, unspecified SLE type, unspecified organ involvement status ([place]) Continue meds and follow up  Prediabetes Discussed disease progression and risks Discussed diet/exercise, weight management and risk modification -     Hemoglobin A1c  Hyperlipidemia, unspecified hyperlipidemia type check lipids decrease fatty foods increase activity.  -     Lipid panel  Vitamin D deficiency -     VITAMIN D 25 Hydroxy (Vit-D Deficiency, Fractures)  Obesity (BMI 30.0-34.9) - follow up 3 months for progress monitoring - increase veggies, decrease carbs - long discussion about weight loss, diet, and exercise  Medication management -     Magnesium  RLS (restless legs syndrome) Increase walking  H/O left nephrectomy Well healed and doing well  Recurrent major depressive disorder, in partial remission ([place]) - continue medications, stress management techniques discussed, increase water, good sleep hygiene discussed, increase exercise, and increase veggies.   Anxiety Continue meds, will try to decrease xanax use but states has anxiety about rectocele and wants repair first  Long term current use of anticoagulant - Eliquis PE Has follow up in Dec, can likely stop after 6 months  History of pulmonary embolus (PE) Has follow up in Dec, can likely stop after 6 months  Acute midline low back pain with bilateral sciatica -      DG Lumbar Spine Complete; Future - check Xray -? 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  Discussed need for pelvic floor prior or after repair  Discussed med's effects and SE's. Screening labs and tests as requested with regular follow-up as recommended. Future Appointments  Date Time Provider [place]  05/02/2018 11:15 AM Curt Bears, MD [facility] None  02/14/2019 10:00 AM Vicie Mutters, PA-C [facility] None    HPI Patient presents for complete physical.    She has rectocele, has constant heaviness from her vagina, has rectal pain, constipation, back pain, MRI showed 2.8 cm anterior rectal wall recectocele. Dr. Lisbeth Renshaw referred to Dr. [place] Pink. Her bilateral legs feel heavy.   She underwent left laparoscopic nephrectomy by Dr. Link Snuffer on 09/19/2017 for left renal mass and carcinoma, had subsequent PE 10/09/2017, on eliquis.   She has depression and anxiety, following with [name], on lexapro, xanax TID and wellbutrin. She is doing well and wants to cut back on xanax.    Patient denies chest pain, shortness of breath, dizziness. BP: (!) 128/92   Patient's cholesterol is diet controlled. The patient's cholesterol last visit was  Lab Results  Component Value Date   CHOL 156 10/17/2017   HDL 34 (L) 10/17/2017   LDLCALC 101 (H) 10/17/2017   TRIG 113 10/17/2017   CHOLHDL 4.6 10/17/2017   The patient has been working on diet and exercise for prediabetes, denies changes in vision, polys, and paresthesias. Last A1C in office was Lab Results  Component Value Date   HGBA1C  6.0 (H) 10/17/2017   Vitamin D deficiency, on vitamin d Lab Results  Component Value Date   VD25OH 51 12/23/2016   She has SLE and follows with [name], on plaquenil. BMI is Body mass index is 35.04 kg/m., she is working on diet and exercise. Wt Readings from Last 3 Encounters:  02/02/18 191 lb 9.6 oz (86.9 kg)  12/01/17  180 lb 2 oz (81.7 kg)  11/04/17 180 lb 1.6 oz (81.7 kg)    Current Medications:  Current Outpatient Medications on File Prior to Visit  Medication Sig Dispense Refill  . acetaminophen (TYLENOL) 500 MG tablet Take 1,000 mg by mouth every 6 (six) hours as needed for mild pain.    Marland Kitchen ALPRAZolam (XANAX) 0.5 MG tablet TAKE 1 TABLET (0.5 MG TOTAL) BY MOUTH 3 (THREE) TIMES DAILY AS NEEDED FOR ANXIETY. 90 tablet 0  . apixaban (ELIQUIS) 5 MG TABS tablet Take 5mg  twice a day by mouth 60 tablet 5  . buPROPion (WELLBUTRIN XL) 150 MG 24 hr tablet TAKE 1 TABLET EVERY MORNING FOR MOOD. 30 tablet 0  . Cholecalciferol (VITAMIN D) 2000 UNITS CAPS Take 1 capsule by mouth daily.    Mariane Baumgarten Calcium (STOOL SOFTENER PO) Take 1 tablet by mouth daily.    Marland Kitchen escitalopram (LEXAPRO) 20 MG tablet TAKE 1 TABLET BY MOUTH EVERY DAY 90 tablet 0  . hydroxychloroquine (PLAQUENIL) 200 MG tablet TAKE 2 TABLETS (400 MG TOTAL) DAILY BY MOUTH. 180 tablet 0  . linaclotide (LINZESS) 290 MCG CAPS capsule Take 290 mcg by mouth daily as needed (constipation).     . Magnesium 500 MG TABS Take 500 mg by mouth daily.     . meloxicam (MOBIC) 15 MG tablet TAKE 1 TAB DAILY W/FOOD FOR 2 WEEKS, THEN AS NEEDED, CAN TAKE W/TYLENOL, DON'T TAKE W/ALEVE OR IBU 90 tablet 1   No current facility-administered medications on file prior to visit.    Health Maintenance:   Immunization History  Administered Date(s) Administered  . Pneumococcal Polysaccharide-23 01/07/2016  . Td 05/19/2003  . Tdap 05/23/2013   TDAP: 2015 Pneumovax: 2017 Flu vaccine: declines Prevnar: N/A Zostavax: N/A  Pap: 2016 neg Luciana Axe, NP MGM: 06/2016- category C- neg DEXA: 2007 normal Colonoscopy: Dr. Alice Reichert in [place] 10/2013 EGD:N/A CT head 2005 CXR 2005 MRI Lumbar spine 2007 Echo 2005 DEE 2014  Patient Care Team: [name], MD as PCP - General (Internal Medicine) Signe Colt, MD as Referring Physician (Obstetrics and Gynecology) Kristeen Miss, MD as Consulting Physician (Neurosurgery) Hennie Duos, MD as Consulting Physician (Rheumatology) Efrain Sella, MD as Referring Physician (Internal Medicine) Ara Kussmaul, MD as Consulting Physician (Ophthalmology)  Medical History:  Past Medical History:  Diagnosis Date  . Anxiety   . Arthritis    pt denies  . Cancer of kidney ([place])    left kidney removed  . Chronic constipation 07/14/2006   Qualifier: Diagnosis of  By: Drucie Ip    . Cystocele   . Depression   . Enterocele   . Hypertension    Labile  pt. denies no meds  . Lupus ([place])   . Prediabetes   . Pulmonary embolism ([place]) 10/09/2017  . Renal cell carcinoma ([place]) 10/09/2017  . RLS (restless legs syndrome) 01/24/2015   Per sleep study 05/2014   . Seizure ([place])   . Sleep apnea   . Vitamin D deficiency    Allergies Allergies  Allergen Reactions  . Bactrim [Sulfamethoxazole-Trimethoprim]     GI upset  .  Cymbalta [Duloxetine Hcl]     Sweating  . Acyclovir And Related Itching    SURGICAL HISTORY She  has a past surgical history that includes Abdominal hysterectomy; Cesarean section; Laparoscopic nephrectomy, hand assisted (Left, 09/19/2017); and Colonoscopy. FAMILY HISTORY Her family history includes Heart disease in her mother; Hyperlipidemia in her mother; Hypertension in her mother. SOCIAL HISTORY She  reports that she has never smoked. She has never used smokeless tobacco. She reports that she does not drink alcohol or use drugs.  Review of Systems  Constitutional: Negative.   HENT: Negative.   Eyes: Negative.   Respiratory: Negative.   Cardiovascular: Negative.   Gastrointestinal: Positive for constipation. Negative for abdominal pain, blood in stool, diarrhea, heartburn, melena, nausea and vomiting.  Genitourinary: Negative.   Musculoskeletal: Positive for back pain. Negative for falls, joint pain, myalgias and neck pain.  Skin: Negative.   Neurological: Negative.  Negative for  dizziness.  Endo/Heme/Allergies: Negative.   Psychiatric/Behavioral: The patient is nervous/anxious.     Physical Exam: Estimated body mass index is 35.04 kg/m as calculated from the following:   Height as of this encounter: 5\' 2"  (1.575 m).   Weight as of this encounter: 191 lb 9.6 oz (86.9 kg). Vitals:   02/02/18 0954  BP: (!) 128/92  Temp: 97.6 F (36.4 C)   General Appearance: Well nourished, in no apparent distress. Eyes: PERRLA, EOMs, conjunctiva no swelling or erythema, normal fundi and vessels. Sinuses: No Frontal/maxillary tenderness ENT/Mouth: Ext aud canals clear, normal light reflex with TMs without erythema, bulging.  Good dentition. No erythema, swelling, or exudate on post pharynx. Tonsils not swollen or erythematous. Hearing normal. Crowded mouth.  Neck: Supple, thyroid normal. No bruits Respiratory: Respiratory effort normal, BS equal bilaterally without rales, rhonchi, wheezing or stridor. Cardio: RRR without murmurs, rubs or gallops. Brisk peripheral pulses without edema.  Chest: symmetric, with normal excursions and percussion. Breasts: defer Abdomen: Soft, obese +BS. Non tender, no guarding, rebound, hernias, masses, or organomegaly. .  Lymphatics: Non tender without lymphadenopathy.  Genitourinary: 7 oclock posterior bulge worse with cough + rectocele Musculoskeletal: Full ROM all peripheral extremities,5/5 strength, and normal gait. Skin: Warm, dry without rashes, lesions, ecchymosis.  Neuro: Cranial nerves intact, reflexes equal bilaterally. Normal muscle tone, no cerebellar symptoms. Sensation intact.  Psych: Awake and oriented X 3, normal affect, Insight and Judgment appropriate.   EKG: defer this year had in May    Kymberli Wiegand 10:05 AM

## 2018-01-31 ENCOUNTER — Other Ambulatory Visit: Payer: Self-pay | Admitting: Adult Health

## 2018-02-02 ENCOUNTER — Ambulatory Visit (INDEPENDENT_AMBULATORY_CARE_PROVIDER_SITE_OTHER): Payer: BC Managed Care – PPO | Admitting: Physician Assistant

## 2018-02-02 ENCOUNTER — Encounter: Payer: Self-pay | Admitting: Physician Assistant

## 2018-02-02 VITALS — BP 128/92 | Temp 97.6°F | Ht 62.0 in | Wt 191.6 lb

## 2018-02-02 DIAGNOSIS — G2581 Restless legs syndrome: Secondary | ICD-10-CM

## 2018-02-02 DIAGNOSIS — E559 Vitamin D deficiency, unspecified: Secondary | ICD-10-CM

## 2018-02-02 DIAGNOSIS — Z0000 Encounter for general adult medical examination without abnormal findings: Secondary | ICD-10-CM

## 2018-02-02 DIAGNOSIS — M5442 Lumbago with sciatica, left side: Secondary | ICD-10-CM

## 2018-02-02 DIAGNOSIS — Z905 Acquired absence of kidney: Secondary | ICD-10-CM

## 2018-02-02 DIAGNOSIS — E66811 Obesity, class 1: Secondary | ICD-10-CM

## 2018-02-02 DIAGNOSIS — E785 Hyperlipidemia, unspecified: Secondary | ICD-10-CM

## 2018-02-02 DIAGNOSIS — G4733 Obstructive sleep apnea (adult) (pediatric): Secondary | ICD-10-CM

## 2018-02-02 DIAGNOSIS — F3341 Major depressive disorder, recurrent, in partial remission: Secondary | ICD-10-CM

## 2018-02-02 DIAGNOSIS — M5441 Lumbago with sciatica, right side: Secondary | ICD-10-CM

## 2018-02-02 DIAGNOSIS — Z79899 Other long term (current) drug therapy: Secondary | ICD-10-CM

## 2018-02-02 DIAGNOSIS — Z7901 Long term (current) use of anticoagulants: Secondary | ICD-10-CM

## 2018-02-02 DIAGNOSIS — F419 Anxiety disorder, unspecified: Secondary | ICD-10-CM

## 2018-02-02 DIAGNOSIS — R7303 Prediabetes: Secondary | ICD-10-CM

## 2018-02-02 DIAGNOSIS — N816 Rectocele: Secondary | ICD-10-CM

## 2018-02-02 DIAGNOSIS — C649 Malignant neoplasm of unspecified kidney, except renal pelvis: Secondary | ICD-10-CM

## 2018-02-02 DIAGNOSIS — M329 Systemic lupus erythematosus, unspecified: Secondary | ICD-10-CM

## 2018-02-02 DIAGNOSIS — I10 Essential (primary) hypertension: Secondary | ICD-10-CM

## 2018-02-02 DIAGNOSIS — Z86711 Personal history of pulmonary embolism: Secondary | ICD-10-CM

## 2018-02-02 DIAGNOSIS — E669 Obesity, unspecified: Secondary | ICD-10-CM

## 2018-02-02 NOTE — Patient Instructions (Addendum)
NEW GUIDELINES FOR BENOZOS  New guidelines suggest the benzodiazepines are best short term, with prolonged use they lead to physical and psychological dependence. In addition, evidence suggest that for insomnia the effectiveness wanes in 4 weeks and the risks out weight their benefits. Use of these agents have been associated with dementia, falls, motor vehicle accidents and physical addiction. Decreasing these medication have been proven to show improvements in cognition, alertness, decrease of falls and daytime sedation.   We will start a slow taper, symptoms of withdrawal include, insomnia, anxiety, irritability, sweating and stomach or intestinal symptoms like diarrhea or nausea.   Check out  Mini habits for weight loss book  2 apps for tracking food is myfitness pal  loseit OR can take picture of your food     When it comes to diets, agreement about the perfect plan isn't easy to find, even among the experts. Experts at the [employer] developed an idea known as the Healthy Eating Plate. Just imagine a plate divided into logical, healthy portions.  The emphasis is on diet quality:  Load up on vegetables and fruits - one-half of your plate: Aim for color and variety, and remember that potatoes don't count.  Go for whole grains - one-quarter of your plate: Whole wheat, barley, wheat berries, quinoa, oats, brown rice, and foods made with them. If you want pasta, go with whole wheat pasta.  Protein power - one-quarter of your plate: Fish, chicken, beans, and nuts are all healthy, versatile protein sources. Limit red meat.  The diet, however, does go beyond the plate, offering a few other suggestions.  Use healthy plant oils, such as olive, canola, soy, corn, sunflower and peanut. Check the labels, and avoid partially hydrogenated oil, which have unhealthy trans fats.  If you're thirsty, drink water. Coffee and tea are good in moderation, but skip sugary drinks and  limit milk and dairy products to one or two daily servings.  The type of carbohydrate in the diet is more important than the amount. Some sources of carbohydrates, such as vegetables, fruits, whole grains, and beans-are healthier than others.  Finally, stay active.   About Rectocele  Overview  A rectocele is a type of hernia which causes different degrees of bulging of the rectal tissues into the vaginal wall.  You may even notice that it presses against the vaginal wall so much that some vaginal tissues droop outside of the opening of your vagina.  Causes of Rectocele  The most common cause is childbirth.  The muscles and ligaments in the pelvis that hold up and support the female organs and vagina become stretched and weakened during labor and delivery.  The more babies you have, the more the support tissues are stretched and weakened.  Not everyone who has a baby will develop a rectocele.  Some women have stronger supporting tissue in the pelvis and may not have as much of a problem as others.  Women who have a Cesarean section usually do not get rectocele's unless they pushed a long time prior to the cesarean delivery.  Other conditions that can cause a rectocele include chronic constipation, a chronic cough, a lot of heavy lifting, and obesity.  Older women may have this problem because the loss of female hormones causes the vaginal tissue to become weaker.  Symptoms  There may not be any symptoms.  If you do have symptoms, they may include:  Pelvic pressure in the rectal area  Protrusion of the  lower part of the vagina through the opening of the vagina  Constipation and trapping of the stool, making it difficult to have a bowel movement.  In severe cases, you may have to press on the lower part of your vagina to help push the stool out of you rectum.  This is called splinting to empty.  Diagnosing Rectocele  Your health care provider will ask about your symptoms and perform a  pelvic exam.  S/he will ask you to bear down, pushing like you are having a bowel movement so as to see how far the lower part of the vagina protrudes into the vagina and possible outside of the vagina.  Your provider will also ask you to contract the muscles of your pelvis (like you are stopping the stream in the middle of urinating) to determine the strength of your pelvic muscles.  Your provider may also do a rectal exam.  Treatment Options  If you do not have any symptoms, no treatment may be necessary.  Other treatment options include:  Pelvic floor exercises: Contracting the muscles in your genital area may help strengthen your muscles and support the organs.  Be sure to get proper exercise instruction from you physical therapist.  A pessary (removealbe pelvic support device) sometimes helps rectocele symptoms.  Surgery: Surgical repair may be necessary. In some cases the uterus may need to be taken out ( a hysterectomy) as well.  There are many types of surgery for pelvic support problems.  Look for physicians who specialize in repair procedures.  You can take care of yourself by:  Treating and preventing constipation  Avoiding heavy lifting, and lifting correctly (with your legs, not with you waist or back)  Treating a chronic cough or bronchitis  Not smoking  avoiding too much weight gain  Doing pelvic floor exercises   2007, [employer] Doc.33

## 2018-02-03 LAB — LIPID PANEL
Cholesterol: 159 mg/dL (ref <200)
HDL: 35 mg/dL — ABNORMAL LOW (ref >50)
LDL Cholesterol (Calc): 93 mg/dL (calc)
Non-HDL Cholesterol (Calc): 124 mg/dL (calc) (ref <130)
Total CHOL/HDL Ratio: 4.5 (calc) (ref <5.0)
Triglycerides: 225 mg/dL — ABNORMAL HIGH (ref <150)

## 2018-02-03 LAB — COMPLETE METABOLIC PANEL WITH GFR
AG Ratio: 1.3 (calc) (ref 1.0–2.5)
ALT: 16 U/L (ref 6–29)
AST: 19 U/L (ref 10–35)
Albumin: 4.1 g/dL (ref 3.6–5.1)
Alkaline phosphatase (APISO): 50 U/L (ref 33–130)
BUN/Creatinine Ratio: 16 (calc) (ref 6–22)
BUN: 19 mg/dL (ref 7–25)
CO2: 30 mmol/L (ref 20–32)
Calcium: 9.6 mg/dL (ref 8.6–10.4)
Chloride: 105 mmol/L (ref 98–110)
Creat: 1.19 mg/dL — ABNORMAL HIGH (ref 0.50–0.99)
GFR, Est African American: 57 mL/min/{1.73_m2} — ABNORMAL LOW (ref >=60)
GFR, Est Non African American: 49 mL/min/{1.73_m2} — ABNORMAL LOW (ref >=60)
Globulin: 3.2 g/dL (calc) (ref 1.9–3.7)
Glucose, Bld: 74 mg/dL (ref 65–99)
Potassium: 4.6 mmol/L (ref 3.5–5.3)
Sodium: 140 mmol/L (ref 135–146)
Total Bilirubin: 0.2 mg/dL (ref 0.2–1.2)
Total Protein: 7.3 g/dL (ref 6.1–8.1)

## 2018-02-03 LAB — CBC WITH DIFFERENTIAL/PLATELET
Basophils Absolute: 20 cells/uL (ref 0–200)
Basophils Relative: 0.5 %
Eosinophils Absolute: 92 cells/uL (ref 15–500)
Eosinophils Relative: 2.3 %
HCT: 37.6 % (ref 35.0–45.0)
Hemoglobin: 12.4 g/dL (ref 11.7–15.5)
Lymphs Abs: 2016 cells/uL (ref 850–3900)
MCH: 29.5 pg (ref 27.0–33.0)
MCHC: 33 g/dL (ref 32.0–36.0)
MCV: 89.5 fL (ref 80.0–100.0)
MPV: 9.4 fL (ref 7.5–12.5)
Monocytes Relative: 11.9 %
Neutro Abs: 1396 cells/uL — ABNORMAL LOW (ref 1500–7800)
Neutrophils Relative %: 34.9 %
Platelets: 259 10*3/uL (ref 140–400)
RBC: 4.2 10*6/uL (ref 3.80–5.10)
RDW: 12.8 % (ref 11.0–15.0)
Total Lymphocyte: 50.4 %
WBC mixed population: 476 cells/uL (ref 200–950)
WBC: 4 10*3/uL (ref 3.8–10.8)

## 2018-02-03 LAB — URINALYSIS, ROUTINE W REFLEX MICROSCOPIC
Bilirubin Urine: NEGATIVE
Glucose, UA: NEGATIVE
Hgb urine dipstick: NEGATIVE
Ketones, ur: NEGATIVE
Leukocytes, UA: NEGATIVE
Nitrite: NEGATIVE
Protein, ur: NEGATIVE
Specific Gravity, Urine: 1.017 (ref 1.001–1.03)
pH: 6.5 (ref 5.0–8.0)

## 2018-02-03 LAB — VITAMIN D 25 HYDROXY (VIT D DEFICIENCY, FRACTURES): Vit D, 25-Hydroxy: 49 ng/mL (ref 30–100)

## 2018-02-03 LAB — MICROALBUMIN / CREATININE URINE RATIO
Creatinine, Urine: 92 mg/dL (ref 20–275)
Microalb, Ur: <0.2 mg/dL

## 2018-02-03 LAB — HEMOGLOBIN A1C
Hgb A1c MFr Bld: 5.9 % of total Hgb — ABNORMAL HIGH (ref <5.7)
Mean Plasma Glucose: 123 (calc)
eAG (mmol/L): 6.8 (calc)

## 2018-02-03 LAB — MAGNESIUM: Magnesium: 1.9 mg/dL (ref 1.5–2.5)

## 2018-02-03 LAB — TSH: TSH: 1.06 mIU/L (ref 0.40–4.50)

## 2018-02-07 DIAGNOSIS — Z131 Encounter for screening for diabetes mellitus: Secondary | ICD-10-CM | POA: Diagnosis not present

## 2018-02-07 DIAGNOSIS — Z0000 Encounter for general adult medical examination without abnormal findings: Secondary | ICD-10-CM | POA: Diagnosis not present

## 2018-02-07 DIAGNOSIS — Z1389 Encounter for screening for other disorder: Secondary | ICD-10-CM | POA: Diagnosis not present

## 2018-02-07 DIAGNOSIS — Z79899 Other long term (current) drug therapy: Secondary | ICD-10-CM | POA: Diagnosis not present

## 2018-02-07 DIAGNOSIS — Z13220 Encounter for screening for lipoid disorders: Secondary | ICD-10-CM | POA: Diagnosis not present

## 2018-02-07 DIAGNOSIS — Z1329 Encounter for screening for other suspected endocrine disorder: Secondary | ICD-10-CM | POA: Diagnosis not present

## 2018-02-07 DIAGNOSIS — E559 Vitamin D deficiency, unspecified: Secondary | ICD-10-CM | POA: Diagnosis not present

## 2018-02-19 ENCOUNTER — Other Ambulatory Visit: Payer: Self-pay | Admitting: Internal Medicine

## 2018-02-23 LAB — HM DIABETES EYE EXAM

## 2018-03-14 ENCOUNTER — Other Ambulatory Visit: Payer: Self-pay | Admitting: Physician Assistant

## 2018-03-26 ENCOUNTER — Other Ambulatory Visit: Payer: Self-pay | Admitting: Internal Medicine

## 2018-04-09 ENCOUNTER — Other Ambulatory Visit: Payer: Self-pay | Admitting: Adult Health

## 2018-04-09 DIAGNOSIS — M25561 Pain in right knee: Principal | ICD-10-CM

## 2018-04-09 DIAGNOSIS — G8929 Other chronic pain: Secondary | ICD-10-CM

## 2018-04-10 ENCOUNTER — Other Ambulatory Visit: Payer: Self-pay | Admitting: *Deleted

## 2018-04-10 MED ORDER — HYDROXYCHLOROQUINE SULFATE 200 MG PO TABS: 400.0000 mg | ORAL_TABLET | Freq: Every day | ORAL | 0 refills | Status: DC

## 2018-04-21 ENCOUNTER — Other Ambulatory Visit: Payer: Self-pay | Admitting: Internal Medicine

## 2018-04-21 DIAGNOSIS — F3341 Major depressive disorder, recurrent, in partial remission: Secondary | ICD-10-CM

## 2018-04-21 DIAGNOSIS — F419 Anxiety disorder, unspecified: Secondary | ICD-10-CM

## 2018-04-21 MED ORDER — ESCITALOPRAM OXALATE 20 MG PO TABS: ORAL_TABLET | ORAL | 1 refills | Status: DC

## 2018-04-23 ENCOUNTER — Other Ambulatory Visit: Payer: Self-pay | Admitting: Adult Health

## 2018-04-24 ENCOUNTER — Other Ambulatory Visit: Payer: Self-pay | Admitting: Adult Health

## 2018-05-02 ENCOUNTER — Other Ambulatory Visit: Payer: Self-pay | Admitting: Adult Health

## 2018-05-02 ENCOUNTER — Inpatient Hospital Stay: Payer: BC Managed Care – PPO | Attending: Internal Medicine | Admitting: Oncology

## 2018-05-02 VITALS — BP 155/70 | HR 59 | Temp 98.0°F | Resp 18 | Ht 62.0 in | Wt 189.3 lb

## 2018-05-02 DIAGNOSIS — I2699 Other pulmonary embolism without acute cor pulmonale: Secondary | ICD-10-CM

## 2018-05-02 DIAGNOSIS — C649 Malignant neoplasm of unspecified kidney, except renal pelvis: Secondary | ICD-10-CM

## 2018-05-02 DIAGNOSIS — Z79899 Other long term (current) drug therapy: Secondary | ICD-10-CM | POA: Diagnosis not present

## 2018-05-02 DIAGNOSIS — Z7901 Long term (current) use of anticoagulants: Secondary | ICD-10-CM

## 2018-05-02 NOTE — Progress Notes (Signed)
Hematology and Oncology Follow Up Visit  Beth [name] [id number] 06-22-1955 62 y.o. 05/02/2018 12:19 PM Beth [name], MDMcKeown, Beth Saxon, MD   Principle Diagnosis: 62 year old woman with pulmonary embolism diagnosed in May 2019.  This was provoked by abdominal surgery and radical nephrectomy performed on Sep 19, 2017.  She has no evidence of inherited or acquired thrombophilia.   Prior Therapy: She is status post radical nephrectomy on Sep 19, 2017.  Pathology showed chromophobe subtype.  Current therapy: Eliquis 5 mg twice a day.  Interim History: [name] presents today for a follow-up visit.  Since the last visit, she reports no recent complaints.  She continues to tolerate Eliquis without any issues.  She denies any excessive bleeding, hematochezia or melena.  She denies any flank pain or discomfort.  She continues to attend to activities of daily living without any decline.  She denies any recent hospitalizations or illnesses.  She does not report any headaches, blurry vision, syncope or seizures. Does not report any fevers, chills or sweats.  Does not report any cough, wheezing or hemoptysis.  Does not report any chest pain, palpitation, orthopnea or leg edema.  Does not report any nausea, vomiting or abdominal pain.  Does not report any changes in bowel habits.  Does not report any arthralgias or myalgias.    Does not report frequency, urgency or hematuria.  Does not report any skin rashes or lesions. Does not report any heat or cold intolerance.  Does not report any bleeding or clotting tendency.  Does not report any mood changes.  Remaining review of systems is negative.    Medications: I have reviewed the patient's current medications.  Current Outpatient Medications  Medication Sig Dispense Refill  . acetaminophen (TYLENOL) 500 MG tablet Take 1,000 mg by mouth every 6 (six) hours as needed for mild pain.    Marland Kitchen ALPRAZolam (XANAX) 0.5 MG tablet Take 1 tab by mouth up 3 tabs as day  AS NEEDED only for panic attacks. Do not take daily due to addictive potential. Should last 6+ weeks 90 tablet 0  . buPROPion (WELLBUTRIN XL) 150 MG 24 hr tablet TAKE 1 TABLET EVERY MORNING FOR MOOD. 90 tablet 1  . Cholecalciferol (VITAMIN D) 2000 UNITS CAPS Take 1 capsule by mouth daily.    Beth [name] Calcium (STOOL SOFTENER PO) Take 1 tablet by mouth daily.    Marland Kitchen ELIQUIS 5 MG TABS tablet TAKE 1 TABLET BY MOUTH TWICE A DAY 180 tablet 1  . escitalopram (LEXAPRO) 20 MG tablet Take 1 &1/2 tablets (30 mg) daily for Mood & Anxiety 135 tablet 1  . hydroxychloroquine (PLAQUENIL) 200 MG tablet Take 2 tablets (400 mg total) by mouth daily. 180 tablet 0  . linaclotide (LINZESS) 290 MCG CAPS capsule Take 290 mcg by mouth daily as needed (constipation).     . Magnesium 500 MG TABS Take 500 mg by mouth daily.     . meloxicam (MOBIC) 15 MG tablet TAKE 1 TAB DAILY W/FOOD FOR 2 WEEKS, THEN AS NEEDED, CAN TAKE W/TYLENOL, DON'T TAKE W/ALEVE OR IBU 90 tablet 1   No current facility-administered medications for this visit.      Allergies:  Allergies  Allergen Reactions  . Bactrim [Sulfamethoxazole-Trimethoprim]     GI upset  . Cymbalta [Duloxetine Hcl]     Sweating  . Acyclovir And Related Itching    Past Medical History, Surgical history, Social history, and Family History were reviewed and updated.    Physical Exam: Blood pressure (!) 155/70,  pulse (!) 59, temperature 98 F (36.7 C), temperature source Oral, resp. rate 18, height 5\' 2"  (1.575 m), weight 189 lb 4.8 oz (85.9 kg), SpO2 98 %. ECOG:  0 General appearance: alert and cooperative appeared without distress. Head: Normocephalic, without obvious abnormality Oropharynx: No oral thrush or ulcers. Eyes: No scleral icterus.  Pupils are equal and round reactive to light. Lymph nodes: Cervical, supraclavicular, and axillary nodes normal. Heart:regular rate and rhythm, S1, S2 normal, no murmur, click, rub or gallop Lung:chest clear, no wheezing,  rales, normal symmetric air entry Abdomin: soft, non-tender, without masses or organomegaly. Neurological: No motor, sensory deficits.  Intact deep tendon reflexes. Skin: No rashes or lesions.  No ecchymosis or petechiae. Musculoskeletal: No joint deformity or effusion.     Lab Results: Lab Results  Component Value Date   WBC 4.0 02/02/2018   HGB 12.4 02/02/2018   HCT 37.6 02/02/2018   MCV 89.5 02/02/2018   PLT 259 02/02/2018     Chemistry      Component Value Date/Time   NA 140 02/02/2018 1038   K 4.6 02/02/2018 1038   CL 105 02/02/2018 1038   CO2 30 02/02/2018 1038   BUN 19 02/02/2018 1038   CREATININE 1.19 (H) 02/02/2018 1038      Component Value Date/Time   CALCIUM 9.6 02/02/2018 1038   ALKPHOS 52 10/09/2017 0947   AST 19 02/02/2018 1038   ALT 16 02/02/2018 1038   BILITOT 0.2 02/02/2018 1038          Impression and Plan:  62 year old woman wit:  1.  Pulmonary embolism presented in May 2019 after abdominal surgery and radical nephrectomy at that time.  Hypercoagulable work-up showed no acquired or inherited thrombophilia.  She completed over 6 months of full dose anticoagulation.  Risks and benefits of continuing Eliquis versus discontinuation was reviewed today.  These complications include risk of bleeding as well as other side effects and costs associated with this medication.  The patient would be offering protection at this time given her risk of thrombosis.  Given the fact that her thrombosis was provoked postoperatively, and her preference is to stop anticoagulation I have recommended discontinuation of Eliquis at this time.  She understands if she develops another clot she will require lifetime anticoagulation.  We have also discussed strategies to decrease her risk of any recurrent thrombosis including early ambulation after surgery and avoid sitting or standing in 1 position for extended period of time.  Weight loss and exercise is also recommended.   She is agreeable with this plan.  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  She is status post nephrectomy.  3.  Follow-up: We will be as needed.  15 minutes was spent with the patient face-to-face today.  More than 50% of time was dedicated to reviewing the natural course of her disease, laboratory data and coordinating plan of care.      Zola Button, MD 12/17/201912:19 PM

## 2018-05-05 ENCOUNTER — Telehealth: Payer: Self-pay

## 2018-05-05 NOTE — Telephone Encounter (Signed)
Per 12/17 no los °

## 2018-05-18 ENCOUNTER — Ambulatory Visit: Payer: Self-pay | Admitting: Physician Assistant

## 2018-05-29 ENCOUNTER — Other Ambulatory Visit: Payer: Self-pay | Admitting: Internal Medicine

## 2018-05-29 DIAGNOSIS — F419 Anxiety disorder, unspecified: Secondary | ICD-10-CM

## 2018-05-29 MED ORDER — BUSPIRONE HCL 15 MG PO TABS: ORAL_TABLET | ORAL | 2 refills | Status: DC

## 2018-06-02 ENCOUNTER — Other Ambulatory Visit: Payer: Self-pay | Admitting: Adult Health

## 2018-06-18 ENCOUNTER — Other Ambulatory Visit: Payer: Self-pay | Admitting: Physician Assistant

## 2018-06-22 ENCOUNTER — Other Ambulatory Visit: Payer: Self-pay | Admitting: Internal Medicine

## 2018-06-22 DIAGNOSIS — F419 Anxiety disorder, unspecified: Secondary | ICD-10-CM

## 2018-06-22 DIAGNOSIS — F3341 Major depressive disorder, recurrent, in partial remission: Secondary | ICD-10-CM

## 2018-06-26 NOTE — Progress Notes (Signed)
Assessment and Plan:  Essential hypertension - continue to monitor, no medications at this time  DASH diet, exercise and monitor at home. Call if greater than 130/80.  -     CBC with Differential/Platelet -     COMPLETE METABOLIC PANEL WITH GFR -     TSH -     Urinalysis, Routine w reflex microscopic -     Microalbumin / creatinine urine ratio  OSA (obstructive sleep apnea) Sleep apnea- continue CPAP, Using nightly CPAP is helping with daytime fatigue,  weight loss still advised Discussed mask hygiene, uses SoClean product for this.  Personal history of renal cell carcinoma S/p nephrectomy and doing well Well healed and doing well Oncology d/c Eliquis 11/19 Now follows with urology, Dr Matilde Sprang, will forward labs.  History of pulmonary embolus S/P nephrectomy Has completed Eliquis therapy and D/c'd 11/19 Doing well, asymptomatic Continue to monitor  Systemic lupus erythematosus, unspecified SLE type, unspecified organ involvement status ([place]) Doing well at this time Continue meds Follows with Dr Amil Amen   Hyperlipidemia, unspecified hyperlipidemia type .Discussed dietary and exercise modifications decrease fatty foods increase activity.  -     Lipid panel   Obesity (BMI 30.0-34.9) - follow up 3 months for progress monitoring - increase veggies, decrease carbs - long discussion about weight loss, diet, and exercise Wants to increase activity after rectocele management, impeding this progress  RLS (restless legs syndrome) Doing well at this time Taking OTC magnesium Increase walking   Recurrent major depressive disorder, in partial remission ([place]) - continue medications: Wellbutrin XL 150mg  daily and Lexapro 1.5 tablets (30)mg daily stress management techniques discussed, increase water, good sleep hygiene discussed, increase exercise, and increase veggies.   Anxiety: Managing, has decreased xanax but still helping. Dealing with rectocele and wants  repair, alternative therapies Continue Buspar 15mg , taking TID PRN feels like this has helped using xanax 1.2 tablet (0.5mg ) PRN.  Discussed continuing to decrease this.  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 Frequency of micturition Will check urine today  Gastroesophageal reflux disease without esophagitis Doing well with dietary management at this time No medications  Chronic Constipation Doing well at this time Discussed dietary and exercise modifications Taking Linzess as needed Continue magnesium  Fatigue Will check TSH, Vit D, CBC, CMP  Vitamin D deficiency -     VITAMIN D 25 Hydroxy (Vit-D Deficiency)  Prediabetes Discussed dietary and exercise modifications Discussed disease progression and risks -     Hemoglobin A1c       - Insulin   Discussed med's effects and SE's. Screening labs and tests as requested with regular follow-up as recommended. Future Appointments  Date Time Provider [place]  06/27/2018  9:30 AM Garnet Sierras, NP [facility] None  02/14/2019 10:00 AM Vicie Mutters, PA-C [facility] None    HPI Patient presents for 11month follow.  She has rectocele, has constant heaviness from her vagina, has rectal pain, constipation, back pain, MRI showed 2.8 cm anterior rectal wall recectocele.  referred to Dr. Marvel Plan, GYN and Dr Bernerd Limbo, Urology.  She is doing peliv floor therapy. Her bilateral legs feel heavy.  Reports she is still has heaviness and feels like things are going to fall out  She is taking Merbetriq daily which has helped with her urinary symptoms but still urinating  frequently.   She underwent left laparoscopic nephrectomy by Dr. Link Snuffer on 09/19/2017 for left renal mass and  carcinoma, had subsequent PE 10/09/2017, on eliquis. She has since discontinued this in 03/2018 after 6 months of therapy.  She has depression and anxiety, following with [name], on lexapro, xanax TID and wellbutrin. She most recently added Buspar 15mg  pRN and reports this has helped and wants to continue to cut back on xanax.    Patient denies chest pain, shortness of breath, dizziness.     Patient's cholesterol is diet controlled. The patient's cholesterol last visit was  Lab Results  Component Value Date   CHOL 159 02/02/2018   HDL 35 (L) 02/02/2018   LDLCALC 93 02/02/2018   TRIG 225 (H) 02/02/2018   CHOLHDL 4.5 02/02/2018   The patient has been working on diet and exercise for prediabetes, denies changes in vision, polys, and paresthesias. Last A1C in office was Lab Results  Component Value Date   HGBA1C 5.9 (H) 02/02/2018   Vitamin D deficiency, on vitamin d Lab Results  Component Value Date   VD25OH 13 02/02/2018   She has SLE and follows with [name], on plaquenil.   BMI is There is no height or weight on file to calculate BMI., she is working on diet and exercise. Wt Readings from Last 3 Encounters:  05/02/18 189 lb 4.8 oz (85.9 kg)  02/02/18 191 lb 9.6 oz (86.9 kg)  12/01/17 180 lb 2 oz (81.7 kg)    Current Medications:  Current Outpatient Medications on File Prior to Visit  Medication Sig Dispense Refill  . acetaminophen (TYLENOL) 500 MG tablet Take 1,000 mg by mouth every 6 (six) hours as needed for mild pain.    Marland Kitchen ALPRAZolam (XANAX) 1 MG tablet TAKE 1/2 TABLET AS NEEDED UP TO 3 X A DAY, LIMIT AMOUNT TO AVOID ADDICTION 45 tablet 0  . buPROPion (WELLBUTRIN XL) 150 MG 24 hr tablet TAKE 1 TABLET EVERY MORNING FOR MOOD. 90 tablet 1  . busPIRone (BUSPAR) 15 MG tablet Take 1/2 to 1 tablet 2 to 3 x /day as needed for Anxiety. 90 tablet 2  . Cholecalciferol (VITAMIN D) 2000 UNITS CAPS Take 1 capsule by mouth daily.    Mariane Baumgarten Calcium (STOOL SOFTENER PO) Take 1  tablet by mouth daily.    Marland Kitchen ELIQUIS 5 MG TABS tablet TAKE 1 TABLET BY MOUTH TWICE A DAY 180 tablet 1  . escitalopram (LEXAPRO) 20 MG tablet TAKE 1 TABLET BY MOUTH EVERY DAY 90 tablet 0  . hydroxychloroquine (PLAQUENIL) 200 MG tablet Take 2 tablets (400 mg total) by mouth daily. 180 tablet 0  . linaclotide (LINZESS) 290 MCG CAPS capsule Take 290 mcg by mouth daily as needed (constipation).     . Magnesium 500 MG TABS Take 500 mg by mouth daily.     . meloxicam (MOBIC) 15 MG tablet TAKE 1 TAB DAILY W/FOOD FOR 2 WEEKS, THEN AS NEEDED, CAN TAKE W/TYLENOL, DON'T TAKE W/ALEVE OR IBU 90 tablet 1   No current facility-administered medications on file prior to visit.    Health Maintenance:   Immunization History  Administered Date(s) Administered  . Pneumococcal Polysaccharide-23 01/07/2016  . Td 05/19/2003  . Tdap 05/23/2013   TDAP: 2015 Pneumovax: 2017 Flu vaccine: declines Prevnar: N/A Shingrix: DUE, discussed with patient, discuss with rheumatologist.  Pap: 2016 neg Luciana Axe, NP MGM: DUE 06/2016- category C- neg DEXA: 2007 normal Colonoscopy: Dr. Alice Reichert in [place] 10/2013 EGD:N/A CT head 2005  CXR 2005 MRI Lumbar spine 2007 Echo 2005 DEE 2014  Patient Care Team: [name], MD as PCP - General (Internal Medicine) Signe Colt, MD as Referring Physician (Obstetrics and Gynecology) Kristeen Miss, MD as Consulting Physician (Neurosurgery) Hennie Duos, MD as Consulting Physician (Rheumatology) Efrain Sella, MD as Referring Physician (Internal Medicine) Ara Kussmaul, MD as Consulting Physician (Ophthalmology)  Medical History:  Past Medical History:  Diagnosis Date  . Anxiety   . Arthritis    pt denies  . Cancer of kidney ([place])    left kidney removed  . Chronic constipation 07/14/2006   Qualifier: Diagnosis of  By: Drucie Ip    . Cystocele   . Depression   . Enterocele   . Hypertension    Labile  pt. denies no meds  . Lupus ([place])   .  Prediabetes   . Pulmonary embolism ([place]) 10/09/2017  . Renal cell carcinoma ([place]) 10/09/2017  . RLS (restless legs syndrome) 01/24/2015   Per sleep study 05/2014   . Seizure ([place])   . Sleep apnea   . Vitamin D deficiency    Allergies Allergies  Allergen Reactions  . Bactrim [Sulfamethoxazole-Trimethoprim]     GI upset  . Cymbalta [Duloxetine Hcl]     Sweating  . Acyclovir And Related Itching    SURGICAL HISTORY She  has a past surgical history that includes Abdominal hysterectomy; Cesarean section; Laparoscopic nephrectomy, hand assisted (Left, 09/19/2017); and Colonoscopy. FAMILY HISTORY Her family history includes Heart disease in her mother; Hyperlipidemia in her mother; Hypertension in her mother. SOCIAL HISTORY She  reports that she has never smoked. She has never used smokeless tobacco. She reports that she does not drink alcohol or use drugs.  Review of Systems  Constitutional: Negative for chills, diaphoresis, fever, malaise/fatigue and weight loss.  HENT: Negative for congestion, ear discharge, ear pain, hearing loss, nosebleeds, sinus pain, sore throat and tinnitus.   Eyes: Negative for blurred vision, double vision, photophobia, pain, discharge and redness.  Respiratory: Negative for cough, hemoptysis, sputum production, shortness of breath, wheezing and stridor.   Cardiovascular: Negative for chest pain, palpitations, orthopnea, claudication, leg swelling and PND.  Gastrointestinal: Negative for abdominal pain, blood in stool, constipation, diarrhea, heartburn, melena, nausea and vomiting.  Genitourinary: Negative for dysuria, flank pain, frequency, hematuria and urgency.  Musculoskeletal: Negative for back pain, falls, joint pain, myalgias and neck pain.  Skin: Negative for itching and rash.  Neurological: Negative for dizziness, tingling, tremors, sensory change, speech change, focal weakness, seizures, loss of consciousness, weakness and headaches.  Endo/Heme/Allergies:  Negative for environmental allergies and polydipsia. Does not bruise/bleed easily.  Psychiatric/Behavioral: Negative for depression, hallucinations, memory loss, substance abuse and suicidal ideas. The patient is not nervous/anxious and does not have insomnia.     Physical Exam: Estimated body mass index is 34.62 kg/m as calculated from the following:   Height as of 05/02/18: 5\' 2"  (1.575 m).   Weight as of 05/02/18: 189 lb 4.8 oz (85.9 kg). There were no vitals filed for this visit. General Appearance: Well nourished, in no apparent distress. Eyes: PERRLA, EOMs, conjunctiva no swelling or erythema, normal fundi and vessels. Sinuses: No Frontal/maxillary tenderness ENT/Mouth: Ext aud canals clear, normal light reflex with TMs without erythema, bulging.  Good dentition. No erythema, swelling, or exudate on post pharynx. Tonsils not swollen or erythematous. Hearing normal. Crowded mouth.  Neck: Supple, thyroid normal. No bruits Respiratory: Respiratory effort normal, BS equal bilaterally without rales, rhonchi, wheezing or stridor. Cardio:  RRR without murmurs, rubs or gallops. Brisk peripheral pulses without edema.  Chest: symmetric, with normal excursions and percussion. Breasts: defer Abdomen: Soft, obese +BS. Non tender, no guarding, rebound, hernias, masses, or organomegaly. .  Lymphatics: Non tender without lymphadenopathy.  Musculoskeletal: Full ROM all peripheral extremities,5/5 strength, and normal gait. Skin: Warm, dry without rashes, lesions, ecchymosis.  Neuro: Cranial nerves intact, reflexes equal bilaterally. Normal muscle tone, no cerebellar symptoms. Sensation intact.  Psych: Awake and oriented X 3, normal affect, Insight and Judgment appropriate.      Garnet Sierras, NP [facility] 06/26/2018  12:57 PM

## 2018-06-27 ENCOUNTER — Ambulatory Visit: Payer: BC Managed Care – PPO | Admitting: Adult Health Nurse Practitioner

## 2018-06-27 ENCOUNTER — Ambulatory Visit: Payer: Self-pay | Admitting: Physician Assistant

## 2018-06-27 ENCOUNTER — Encounter: Payer: Self-pay | Admitting: Adult Health Nurse Practitioner

## 2018-06-27 VITALS — BP 134/76 | HR 72 | Temp 97.2°F | Ht 62.0 in | Wt 190.6 lb

## 2018-06-27 DIAGNOSIS — E785 Hyperlipidemia, unspecified: Secondary | ICD-10-CM | POA: Diagnosis not present

## 2018-06-27 DIAGNOSIS — Z79899 Other long term (current) drug therapy: Secondary | ICD-10-CM

## 2018-06-27 DIAGNOSIS — R7303 Prediabetes: Secondary | ICD-10-CM

## 2018-06-27 DIAGNOSIS — E559 Vitamin D deficiency, unspecified: Secondary | ICD-10-CM

## 2018-06-27 DIAGNOSIS — Z85528 Personal history of other malignant neoplasm of kidney: Secondary | ICD-10-CM

## 2018-06-27 DIAGNOSIS — M329 Systemic lupus erythematosus, unspecified: Secondary | ICD-10-CM

## 2018-06-27 DIAGNOSIS — N816 Rectocele: Secondary | ICD-10-CM

## 2018-06-27 DIAGNOSIS — G4733 Obstructive sleep apnea (adult) (pediatric): Secondary | ICD-10-CM

## 2018-06-27 DIAGNOSIS — K219 Gastro-esophageal reflux disease without esophagitis: Secondary | ICD-10-CM

## 2018-06-27 DIAGNOSIS — K5909 Other constipation: Secondary | ICD-10-CM

## 2018-06-27 DIAGNOSIS — N393 Stress incontinence (female) (male): Secondary | ICD-10-CM

## 2018-06-27 DIAGNOSIS — R350 Frequency of micturition: Secondary | ICD-10-CM | POA: Diagnosis not present

## 2018-06-27 DIAGNOSIS — Z86711 Personal history of pulmonary embolism: Secondary | ICD-10-CM

## 2018-06-27 DIAGNOSIS — I10 Essential (primary) hypertension: Secondary | ICD-10-CM | POA: Diagnosis not present

## 2018-06-27 DIAGNOSIS — F419 Anxiety disorder, unspecified: Secondary | ICD-10-CM

## 2018-06-27 DIAGNOSIS — E66811 Obesity, class 1: Secondary | ICD-10-CM

## 2018-06-27 DIAGNOSIS — E669 Obesity, unspecified: Secondary | ICD-10-CM

## 2018-06-27 DIAGNOSIS — R5383 Other fatigue: Secondary | ICD-10-CM | POA: Diagnosis not present

## 2018-06-27 DIAGNOSIS — F3341 Major depressive disorder, recurrent, in partial remission: Secondary | ICD-10-CM

## 2018-06-27 MED ORDER — ESCITALOPRAM OXALATE 20 MG PO TABS: ORAL_TABLET | ORAL | 2 refills | Status: DC

## 2018-06-27 MED ORDER — ALPRAZOLAM 1 MG PO TABS: ORAL_TABLET | ORAL | 1 refills | Status: DC

## 2018-06-27 NOTE — Patient Instructions (Addendum)
Today we are going to check CBC, CMP, Magnesium, Lipids, A1c, Insulin, TSH, Vit D, B12.  See if the Urology office will accept these OR if they need something else, we might be able to add to the sample you gave to you today.   Continue your persistence with pelvic/rectal therapies.  It is slow to show results, but strengthening those muscles will prove beneficial.   Call or return with new or worsening symptoms as discussed in appointment.  May contact via office phone [phone] or via [place].   Preventive Care for Adults  A healthy lifestyle and preventive care can promote health and wellness. Preventive health guidelines for women include the following key practices.  A routine yearly physical is a good way to check with your health care provider about your health and preventive screening. It is a chance to share any concerns and updates on your health and to receive a thorough exam.  Visit your dentist for a routine exam and preventive care every 6 months. Brush your teeth twice a day and floss once a day. Good oral hygiene prevents tooth decay and gum disease.  The frequency of eye exams is based on your age, health, family medical history, use of contact lenses, and other factors. Follow your health care provider's recommendations for frequency of eye exams.  Eat a healthy diet. Foods like vegetables, fruits, whole grains, low-fat dairy products, and lean protein foods contain the nutrients you need without too many calories. Decrease your intake of foods high in solid fats, added sugars, and salt. Eat the right amount of calories for you. Get information about a proper diet from your health care provider, if necessary.  Regular physical exercise is one of the most important things you can do for your health. Most adults should get at least 150 minutes of moderate-intensity exercise (any activity that increases your heart rate and causes you to sweat) each week. In addition, most  adults need muscle-strengthening exercises on 2 or more days a week.  Maintain a healthy weight. The body mass index (BMI) is a screening tool to identify possible weight problems. It provides an estimate of body fat based on height and weight. Your health care provider can find your BMI and can help you achieve or maintain a healthy weight. For adults 20 years and older:  A BMI below 18.5 is considered underweight.  A BMI of 18.5 to 24.9 is normal.  A BMI of 25 to 29.9 is considered overweight.  A BMI of 30 and above is considered obese.  Maintain normal blood lipids and cholesterol levels by exercising and minimizing your intake of saturated fat. Eat a balanced diet with plenty of fruit and vegetables. Blood tests for lipids and cholesterol should begin at age 32 and be repeated every 5 years. If your lipid or cholesterol levels are high, you are over 50, or you are at high risk for heart disease, you may need your cholesterol levels checked more frequently. Ongoing high lipid and cholesterol levels should be treated with medicines if diet and exercise are not working.  If you smoke, find out from your health care provider how to quit. If you do not use tobacco, do not start.  Lung cancer screening is recommended for adults aged 46-80 years who are at high risk for developing lung cancer because of a history of smoking. A yearly low-dose CT scan of the lungs is recommended for people who have at least a 30-pack-year history of smoking and  are a current smoker or have quit within the past 15 years. A pack year of smoking is smoking an average of 1 pack of cigarettes a day for 1 year (for example: 1 pack a day for 30 years or 2 packs a day for 15 years). Yearly screening should continue until the smoker has stopped smoking for at least 15 years. Yearly screening should be stopped for people who develop a health problem that would prevent them from having lung cancer treatment.  High blood pressure  causes heart disease and increases the risk of stroke. Your blood pressure should be checked at least every 1 to 2 years. Ongoing high blood pressure should be treated with medicines if weight loss and exercise do not work.  If you are 36-51 years old, ask your health care provider if you should take aspirin to prevent strokes.  Diabetes screening involves taking a blood sample to check your fasting blood sugar level. This should be done once every 3 years, after age 30, if you are within normal weight and without risk factors for diabetes. Testing should be considered at a younger age or be carried out more frequently if you are overweight and have at least 1 risk factor for diabetes.  Breast cancer screening is essential preventive care for women. You should practice "breast self-awareness." This means understanding the normal appearance and feel of your breasts and may include breast self-examination. Any changes detected, no matter how small, should be reported to a health care provider. Women in their 48s and 30s should have a clinical breast exam (CBE) by a health care provider as part of a regular health exam every 1 to 3 years. After age 31, women should have a CBE every year. Starting at age 45, women should consider having a mammogram (breast X-ray test) every year. Women who have a family history of breast cancer should talk to their health care provider about genetic screening. Women at a high risk of breast cancer should talk to their health care providers about having an MRI and a mammogram every year.  Breast cancer gene (BRCA)-related cancer risk assessment is recommended for women who have family members with BRCA-related cancers. BRCA-related cancers include breast, ovarian, tubal, and peritoneal cancers. Having family members with these cancers may be associated with an increased risk for harmful changes (mutations) in the breast cancer genes BRCA1 and BRCA2. Results of the assessment will  determine the need for genetic counseling and BRCA1 and BRCA2 testing.  Routine pelvic exams to screen for cancer are no longer recommended for nonpregnant women who are considered low risk for cancer of the pelvic organs (ovaries, uterus, and vagina) and who do not have symptoms. Ask your health care provider if a screening pelvic exam is right for you.  If you have had past treatment for cervical cancer or a condition that could lead to cancer, you need Pap tests and screening for cancer for at least 20 years after your treatment. If Pap tests have been discontinued, your risk factors (such as having a new sexual partner) need to be reassessed to determine if screening should be resumed. Some women have medical problems that increase the chance of getting cervical cancer. In these cases, your health care provider may recommend more frequent screening and Pap tests.  Colorectal cancer can be detected and often prevented. Most routine colorectal cancer screening begins at the age of 39 years and continues through age 63 years. However, your health care provider may recommend  screening at an earlier age if you have risk factors for colon cancer. On a yearly basis, your health care provider may provide home test kits to check for hidden blood in the stool. Use of a small camera at the end of a tube, to directly examine the colon (sigmoidoscopy or colonoscopy), can detect the earliest forms of colorectal cancer. Talk to your health care provider about this at age 65, when routine screening begins.  Direct exam of the colon should be repeated every 5-10 years through age 55 years, unless early forms of pre-cancerous polyps or small growths are found.  Hepatitis C blood testing is recommended for all people born from [birth year] through 1965 and any individual with known risks for hepatitis C.  Pra  Osteoporosis is a disease in which the bones lose minerals and strength with aging. This can result in serious bone  fractures or breaks. The risk of osteoporosis can be identified using a bone density scan. Women ages 22 years and over and women at risk for fractures or osteoporosis should discuss screening with their health care providers. Ask your health care provider whether you should take a calcium supplement or vitamin D to reduce the rate of osteoporosis.  Menopause can be associated with physical symptoms and risks. Hormone replacement therapy is available to decrease symptoms and risks. You should talk to your health care provider about whether hormone replacement therapy is right for you.  Use sunscreen. Apply sunscreen liberally and repeatedly throughout the day. You should seek shade when your shadow is shorter than you. Protect yourself by wearing long sleeves, pants, a wide-brimmed hat, and sunglasses year round, whenever you are outdoors.  Once a month, do a whole body skin exam, using a mirror to look at the skin on your back. Tell your health care provider of new moles, moles that have irregular borders, moles that are larger than a pencil eraser, or moles that have changed in shape or color.  Stay current with required vaccines (immunizations).  Influenza vaccine. All adults should be immunized every year.  Tetanus, diphtheria, and acellular pertussis (Td, Tdap) vaccine. Pregnant women should receive 1 dose of Tdap vaccine during each pregnancy. The dose should be obtained regardless of the length of time since the last dose. Immunization is preferred during the 27th-36th week of gestation. An adult who has not previously received Tdap or who does not know her vaccine status should receive 1 dose of Tdap. This initial dose should be followed by tetanus and diphtheria toxoids (Td) booster doses every 10 years. Adults with an unknown or incomplete history of completing a 3-dose immunization series with Td-containing vaccines should begin or complete a primary immunization series including a Tdap dose.  Adults should receive a Td booster every 10 years.  Varicella vaccine. An adult without evidence of immunity to varicella should receive 2 doses or a second dose if she has previously received 1 dose. Pregnant females who do not have evidence of immunity should receive the first dose after pregnancy. This first dose should be obtained before leaving the health care facility. The second dose should be obtained 4-8 weeks after the first dose.  Human papillomavirus (HPV) vaccine. Females aged 13-26 years who have not received the vaccine previously should obtain the 3-dose series. The vaccine is not recommended for use in pregnant females. However, pregnancy testing is not needed before receiving a dose. If a female is found to be pregnant after receiving a dose, no treatment is needed. In  that case, the remaining doses should be delayed until after the pregnancy. Immunization is recommended for any person with an immunocompromised condition through the age of 53 years if she did not get any or all doses earlier. During the 3-dose series, the second dose should be obtained 4-8 weeks after the first dose. The third dose should be obtained 24 weeks after the first dose and 16 weeks after the second dose.  Zoster vaccine. One dose is recommended for adults aged 78 years or older unless certain conditions are present.  Measles, mumps, and rubella (MMR) vaccine. Adults born before 50 generally are considered immune to measles and mumps. Adults born in [birth year] or later should have 1 or more doses of MMR vaccine unless there is a contraindication to the vaccine or there is laboratory evidence of immunity to each of the three diseases. A routine second dose of MMR vaccine should be obtained at least 28 days after the first dose for students attending postsecondary schools, health care workers, or international travelers. People who received inactivated measles vaccine or an unknown type of measles vaccine during  1963-1967 should receive 2 doses of MMR vaccine. People who received inactivated mumps vaccine or an unknown type of mumps vaccine before 1979 and are at high risk for mumps infection should consider immunization with 2 doses of MMR vaccine. For females of childbearing age, rubella immunity should be determined. If there is no evidence of immunity, females who are not pregnant should be vaccinated. If there is no evidence of immunity, females who are pregnant should delay immunization until after pregnancy. Unvaccinated health care workers born before [birth year] who lack laboratory evidence of measles, mumps, or rubella immunity or laboratory confirmation of disease should consider measles and mumps immunization with 2 doses of MMR vaccine or rubella immunization with 1 dose of MMR vaccine.  Pneumococcal 13-valent conjugate (PCV13) vaccine. When indicated, a person who is uncertain of her immunization history and has no record of immunization should receive the PCV13 vaccine. An adult aged 89 years or older who has certain medical conditions and has not been previously immunized should receive 1 dose of PCV13 vaccine. This PCV13 should be followed with a dose of pneumococcal polysaccharide (PPSV23) vaccine. The PPSV23 vaccine dose should be obtained at least 1 or more year(s) after the dose of PCV13 vaccine. An adult aged 73 years or older who has certain medical conditions and previously received 1 or more doses of PPSV23 vaccine should receive 1 dose of PCV13. The PCV13 vaccine dose should be obtained 1 or more years after the last PPSV23 vaccine dose.    Pneumococcal polysaccharide (PPSV23) vaccine. When PCV13 is also indicated, PCV13 should be obtained first. All adults aged 63 years and older should be immunized. An adult younger than age 55 years who has certain medical conditions should be immunized. Any person who resides in a nursing home or long-term care facility should be immunized. An adult smoker  should be immunized. People with an immunocompromised condition and certain other conditions should receive both PCV13 and PPSV23 vaccines. People with human immunodeficiency virus (HIV) infection should be immunized as soon as possible after diagnosis. Immunization during chemotherapy or radiation therapy should be avoided. Routine use of PPSV23 vaccine is not recommended for American Indians, [place] Natives, or people younger than 65 years unless there are medical conditions that require PPSV23 vaccine. When indicated, people who have unknown immunization and have no record of immunization should receive PPSV23 vaccine. One-time revaccination 5 years  after the first dose of PPSV23 is recommended for people aged 19-64 years who have chronic kidney failure, nephrotic syndrome, asplenia, or immunocompromised conditions. People who received 1-2 doses of PPSV23 before age 31 years should receive another dose of PPSV23 vaccine at age 38 years or later if at least 5 years have passed since the previous dose. Doses of PPSV23 are not needed for people immunized with PPSV23 at or after age 58 years.  Preventive Services / Frequency   Ages 69 to 76 years  Blood pressure check.  Lipid and cholesterol check.  Lung cancer screening. / Every year if you are aged 85-80 years and have a 30-pack-year history of smoking and currently smoke or have quit within the past 15 years. Yearly screening is stopped once you have quit smoking for at least 15 years or develop a health problem that would prevent you from having lung cancer treatment.  Clinical breast exam.** / Every year after age 62 years.   BRCA-related cancer risk assessment.** / For women who have family members with a BRCA-related cancer (breast, ovarian, tubal, or peritoneal cancers).  Mammogram.** / Every year beginning at age 66 years and continuing for as long as you are in good health. Consult with your health care provider.  Pap test.** / Every 3  years starting at age 98 years through age 5 or 40 years with a history of 3 consecutive normal Pap tests.  HPV screening.** / Every 3 years from ages 66 years through ages 58 to 52 years with a history of 3 consecutive normal Pap tests.  Fecal occult blood test (FOBT) of stool. / Every year beginning at age 21 years and continuing until age 45 years. You may not need to do this test if you get a colonoscopy every 10 years.  Flexible sigmoidoscopy or colonoscopy.** / Every 5 years for a flexible sigmoidoscopy or every 10 years for a colonoscopy beginning at age 63 years and continuing until age 39 years.  Hepatitis C blood test.** / For all people born from [birth year] through 1965 and any individual with known risks for hepatitis C.  Skin self-exam. / Monthly.  Influenza vaccine. / Every year.  Tetanus, diphtheria, and acellular pertussis (Tdap/Td) vaccine.** / Consult your health care provider. Pregnant women should receive 1 dose of Tdap vaccine during each pregnancy. 1 dose of Td every 10 years.  Varicella vaccine.** / Consult your health care provider. Pregnant females who do not have evidence of immunity should receive the first dose after pregnancy.  Zoster vaccine.** / 1 dose for adults aged 77 years or older.  Pneumococcal 13-valent conjugate (PCV13) vaccine.** / Consult your health care provider.  Pneumococcal polysaccharide (PPSV23) vaccine.** / 1 to 2 doses if you smoke cigarettes or if you have certain conditions.  Meningococcal vaccine.** / Consult your health care provider.  Hepatitis A vaccine.** / Consult your health care provider.  Hepatitis B vaccine.** / Consult your health care provider. Screening for abdominal aortic aneurysm (AAA)  by ultrasound is recommended for people over 50 who have history of high blood pressure or who are current or former smokers. ++++++++++++++++++ Recommend Adult Low Dose Aspirin or  coated  Aspirin 81 mg daily  To reduce risk of Colon  Cancer 20 %,  Skin Cancer 26 % ,  Melanoma 46%  and  Pancreatic cancer 60% +++++++++++++++++++ Vitamin D goal  is between 70-100.  Please make sure that you are taking your Vitamin D as directed.  It is very important  as a natural anti-inflammatory  helping hair, skin, and nails, as well as reducing stroke and heart attack risk.  It helps your bones and helps with mood. It also decreases numerous cancer risks so please take it as directed.  Low Vit D is associated with a 200-300% higher risk for CANCER  and 200-300% higher risk for HEART   ATTACK  &  STROKE.   .....................................Marland Kitchen It is also associated with higher death rate at younger ages,  autoimmune diseases like Rheumatoid arthritis, Lupus, Multiple Sclerosis.    Also many other serious conditions, like depression, Alzheimer's Dementia, infertility, muscle aches, fatigue, fibromyalgia - just to name a few. ++++++++++++++++++ Recommend the book "The END of DIETING" by Dr Excell Seltzer  & the book "The END of DIABETES " by Dr Excell Seltzer At [email or web address] - get book & Audio CD's    Being diabetic has a  300% increased risk for heart attack, stroke, cancer, and alzheimer- type vascular dementia. It is very important that you work harder with diet by avoiding all foods that are white. Avoid white rice (brown & wild rice is OK), white potatoes (sweetpotatoes in moderation is OK), White bread or wheat bread or anything made out of white flour like bagels, donuts, rolls, buns, biscuits, cakes, pastries, cookies, pizza crust, and pasta (made from white flour & egg whites) - vegetarian pasta or spinach or wheat pasta is OK. Multigrain breads like Arnold's or Pepperidge [employer], or multigrain sandwich thins or flatbreads.  Diet, exercise and weight loss can reverse and cure diabetes in the early stages.  Diet, exercise and weight loss is very important in the control and prevention of complications of diabetes which affects every system  in your body, ie. Brain - dementia/stroke, eyes - glaucoma/blindness, heart - heart attack/heart failure, kidneys - dialysis, stomach - gastric paralysis, intestines - malabsorption, nerves - severe painful neuritis, circulation - gangrene & loss of a leg(s), and finally cancer and Alzheimers.    I recommend avoid fried & greasy foods,  sweets/candy, white rice (brown or wild rice or Quinoa is OK), white potatoes (sweet potatoes are OK) - anything made from white flour - bagels, doughnuts, rolls, buns, biscuits,white and wheat breads, pizza crust and traditional pasta made of white flour & egg white(vegetarian pasta or spinach or wheat pasta is OK).  Multi-grain bread is OK - like multi-grain flat bread or sandwich thins. Avoid alcohol in excess. Exercise is also important.    Eat all the vegetables you want - avoid meat, especially red meat and dairy - especially cheese.  Cheese is the most concentrated form of trans-fats which is the worst thing to clog up our arteries. Veggie cheese is OK which can be found in the fresh produce section at [employer] or [employer] or [employer]  ++++++++++++++++++++++ DASH Eating Plan  DASH stands for "Dietary Approaches to Stop Hypertension."   The DASH eating plan is a healthy eating plan that has been shown to reduce high blood pressure (hypertension). Additional health benefits may include reducing the risk of type 2 diabetes mellitus, heart disease, and stroke. The DASH eating plan may also help with weight loss. WHAT DO I NEED TO KNOW ABOUT THE DASH EATING PLAN? For the DASH eating plan, you will follow these general guidelines:  Choose foods with a percent daily value for sodium of less than 5% (as listed on the food label).  Use salt-free seasonings or herbs instead of table salt or sea salt.  Check with your  health care provider or pharmacist before using salt substitutes.  Eat lower-sodium products, often labeled as "lower sodium" or "no salt  added."  Eat fresh foods.  Eat more vegetables, fruits, and low-fat dairy products.  Choose whole grains. Look for the word "whole" as the first word in the ingredient list.  Choose fish   Limit sweets, desserts, sugars, and sugary drinks.  Choose heart-healthy fats.  Eat veggie cheese   Eat more home-cooked food and less restaurant, buffet, and fast food.  Limit fried foods.  Cook foods using methods other than frying.  Limit canned vegetables. If you do use them, rinse them well to decrease the sodium.  When eating at a restaurant, ask that your food be prepared with less salt, or no salt if possible.                      WHAT FOODS CAN I EAT? Read Dr Fara Olden Fuhrman's books on The End of Dieting & The End of Diabetes  Grains Whole grain or whole wheat bread. Brown rice. Whole grain or whole wheat pasta. Quinoa, bulgur, and whole grain cereals. Low-sodium cereals. Corn or whole wheat flour tortillas. Whole grain cornbread. Whole grain crackers. Low-sodium crackers.  Vegetables Fresh or frozen vegetables (raw, steamed, roasted, or grilled). Low-sodium or reduced-sodium tomato and vegetable juices. Low-sodium or reduced-sodium tomato sauce and paste. Low-sodium or reduced-sodium canned vegetables.   Fruits All fresh, canned (in natural juice), or frozen fruits.  Protein Products  All fish and seafood.  Dried beans, peas, or lentils. Unsalted nuts and seeds. Unsalted canned beans.  Dairy Low-fat dairy products, such as skim or 1% milk, 2% or reduced-fat cheeses, low-fat ricotta or cottage cheese, or plain low-fat yogurt. Low-sodium or reduced-sodium cheeses.  Fats and Oils Tub margarines without trans fats. Light or reduced-fat mayonnaise and salad dressings (reduced sodium). Avocado. Safflower, olive, or canola oils. Natural peanut or almond butter.  Other Unsalted popcorn and pretzels. The items listed above may not be a complete list of recommended foods or beverages.  Contact your dietitian for more options.  ++++++++++++++++++  WHAT FOODS ARE NOT RECOMMENDED? Grains/ White flour or wheat flour White bread. White pasta. White rice. Refined cornbread. Bagels and croissants. Crackers that contain trans fat.  Vegetables  Creamed or fried vegetables. Vegetables in a . Regular canned vegetables. Regular canned tomato sauce and paste. Regular tomato and vegetable juices.  Fruits Dried fruits. Canned fruit in light or heavy syrup. Fruit juice.  Meat and Other Protein Products Meat in general - RED meat & White meat.  Fatty cuts of meat. Ribs, chicken wings, all processed meats as bacon, sausage, bologna, salami, fatback, hot dogs, bratwurst and packaged luncheon meats.  Dairy Whole or 2% milk, cream, half-and-half, and cream cheese. Whole-fat or sweetened yogurt. Full-fat cheeses or blue cheese. Non-dairy creamers and whipped toppings. Processed cheese, cheese spreads, or cheese curds.  Condiments Onion and garlic salt, seasoned salt, table salt, and sea salt. Canned and packaged gravies. Worcestershire sauce. Tartar sauce. Barbecue sauce. Teriyaki sauce. Soy sauce, including reduced sodium. Steak sauce. Fish sauce. Oyster sauce. Cocktail sauce. Horseradish. Ketchup and mustard. Meat flavorings and tenderizers. Bouillon cubes. Hot sauce. Tabasco sauce. Marinades. Taco seasonings. Relishes.  Fats and Oils Butter, stick margarine, lard, shortening and bacon fat. Coconut, palm kernel, or palm oils. Regular salad dressings.  Pickles and olives. Salted popcorn and pretzels.  The items listed above may not be a complete list of foods and beverages  to avoid.

## 2018-06-29 LAB — URINE CULTURE
MICRO NUMBER:: 185250
Result:: NO GROWTH
SPECIMEN QUALITY:: ADEQUATE

## 2018-06-29 LAB — CBC WITH DIFFERENTIAL/PLATELET
Absolute Monocytes: 418 cells/uL (ref 200–950)
Basophils Absolute: 19 cells/uL (ref 0–200)
Basophils Relative: 0.5 %
Eosinophils Absolute: 49 cells/uL (ref 15–500)
Eosinophils Relative: 1.3 %
HCT: 40 % (ref 35.0–45.0)
Hemoglobin: 13.4 g/dL (ref 11.7–15.5)
Lymphs Abs: 1410 cells/uL (ref 850–3900)
MCH: 30.2 pg (ref 27.0–33.0)
MCHC: 33.5 g/dL (ref 32.0–36.0)
MCV: 90.3 fL (ref 80.0–100.0)
MPV: 9.4 fL (ref 7.5–12.5)
Monocytes Relative: 11 %
Neutro Abs: 1904 cells/uL (ref 1500–7800)
Neutrophils Relative %: 50.1 %
Platelets: 268 10*3/uL (ref 140–400)
RBC: 4.43 10*6/uL (ref 3.80–5.10)
RDW: 12.6 % (ref 11.0–15.0)
Total Lymphocyte: 37.1 %
WBC: 3.8 10*3/uL (ref 3.8–10.8)

## 2018-06-29 LAB — COMPLETE METABOLIC PANEL WITH GFR
AG Ratio: 1.2 (calc) (ref 1.0–2.5)
ALT: 13 U/L (ref 6–29)
AST: 16 U/L (ref 10–35)
Albumin: 4.2 g/dL (ref 3.6–5.1)
Alkaline phosphatase (APISO): 55 U/L (ref 37–153)
BUN/Creatinine Ratio: 17 (calc) (ref 6–22)
BUN: 18 mg/dL (ref 7–25)
CO2: 28 mmol/L (ref 20–32)
Calcium: 9.4 mg/dL (ref 8.6–10.4)
Chloride: 105 mmol/L (ref 98–110)
Creat: 1.07 mg/dL — ABNORMAL HIGH (ref 0.50–0.99)
GFR, Est African American: 64 mL/min/{1.73_m2} (ref >=60)
GFR, Est Non African American: 55 mL/min/{1.73_m2} — ABNORMAL LOW (ref >=60)
Globulin: 3.6 g/dL (calc) (ref 1.9–3.7)
Glucose, Bld: 83 mg/dL (ref 65–99)
Potassium: 4.6 mmol/L (ref 3.5–5.3)
Sodium: 139 mmol/L (ref 135–146)
Total Bilirubin: 0.4 mg/dL (ref 0.2–1.2)
Total Protein: 7.8 g/dL (ref 6.1–8.1)

## 2018-06-29 LAB — URINALYSIS W MICROSCOPIC + REFLEX CULTURE
Bilirubin Urine: NEGATIVE
Glucose, UA: NEGATIVE
Hgb urine dipstick: NEGATIVE
Hyaline Cast: NONE SEEN /LPF
Ketones, ur: NEGATIVE
Nitrites, Initial: NEGATIVE
Protein, ur: NEGATIVE
Specific Gravity, Urine: 1.017 (ref 1.001–1.03)
pH: 5.5 (ref 5.0–8.0)

## 2018-06-29 LAB — TSH: TSH: 0.82 mIU/L (ref 0.40–4.50)

## 2018-06-29 LAB — HEMOGLOBIN A1C
Hgb A1c MFr Bld: 5.8 % of total Hgb — ABNORMAL HIGH (ref <5.7)
Mean Plasma Glucose: 120 (calc)
eAG (mmol/L): 6.6 (calc)

## 2018-06-29 LAB — LIPID PANEL
Cholesterol: 175 mg/dL (ref <200)
HDL: 48 mg/dL — ABNORMAL LOW (ref >=50)
LDL Cholesterol (Calc): 111 mg/dL (calc) — ABNORMAL HIGH
Non-HDL Cholesterol (Calc): 127 mg/dL (calc) (ref <130)
Total CHOL/HDL Ratio: 3.6 (calc) (ref <5.0)
Triglycerides: 72 mg/dL (ref <150)

## 2018-06-29 LAB — VITAMIN B12: Vitamin B-12: 649 pg/mL (ref 200–1100)

## 2018-06-29 LAB — CULTURE INDICATED

## 2018-06-29 LAB — INSULIN, RANDOM: Insulin: 32.3 u[IU]/mL — ABNORMAL HIGH (ref 2.0–19.6)

## 2018-06-29 LAB — MAGNESIUM: Magnesium: 2 mg/dL (ref 1.5–2.5)

## 2018-06-29 LAB — VITAMIN D 25 HYDROXY (VIT D DEFICIENCY, FRACTURES): Vit D, 25-Hydroxy: 49 ng/mL (ref 30–100)

## 2018-07-02 ENCOUNTER — Other Ambulatory Visit: Payer: Self-pay | Admitting: Internal Medicine

## 2018-07-17 ENCOUNTER — Other Ambulatory Visit: Payer: Self-pay | Admitting: Internal Medicine

## 2018-08-06 ENCOUNTER — Other Ambulatory Visit: Payer: Self-pay | Admitting: Internal Medicine

## 2018-08-13 DIAGNOSIS — B3731 Acute candidiasis of vulva and vagina: Secondary | ICD-10-CM

## 2018-08-13 DIAGNOSIS — B373 Candidiasis of vulva and vagina: Secondary | ICD-10-CM

## 2018-08-14 ENCOUNTER — Other Ambulatory Visit: Payer: BC Managed Care – PPO | Admitting: Adult Health Nurse Practitioner

## 2018-08-14 DIAGNOSIS — B373 Candidiasis of vulva and vagina: Secondary | ICD-10-CM

## 2018-08-14 DIAGNOSIS — B3731 Acute candidiasis of vulva and vagina: Secondary | ICD-10-CM

## 2018-08-14 DIAGNOSIS — N898 Other specified noninflammatory disorders of vagina: Secondary | ICD-10-CM | POA: Diagnosis not present

## 2018-08-14 MED ORDER — TERCONAZOLE 0.4 % VA CREA: 1.0000 | TOPICAL_CREAM | Freq: Every day | VAGINAL | 2 refills | Status: DC

## 2018-08-14 MED ORDER — FLUCONAZOLE 150 MG PO TABS: ORAL_TABLET | ORAL | 0 refills | Status: DC

## 2018-08-14 NOTE — Progress Notes (Addendum)
Electronic communication logged March 29-March 30.  Virtual Visit via Telephone Note  I connected with Beth [name] on 08/15/18 at  by telephone and verified that I am speaking with the correct person using two identifiers.   I discussed the limitations, risks, security and privacy concerns of performing an evaluation and management service by telephone and the availability of in person appointments. I also discussed with the patient that there may be a patient responsible charge related to this service. The patient expressed understanding and agreed to proceed.  *Follow up via telephone 08/15/18.  She was able to pick up both prescriptions.  No concerns about medications at this time.  Informed to contact office with any new or worsening symptoms.  Patient agrees to plan of care as discussed.   Assessment & Plan:  Diagnoses and all orders for this visit:  Vaginal itching -     terconazole (TERAZOL 7) 0.4 % vaginal cream; Place 1 applicator vaginally at bedtime. Monitor symptoms and skin Use mild soap when cleansing Avoid douching  Candidiasis, vagina -     fluconazole (DIFLUCAN) 150 MG tablet; Take one tablet at onset of symptoms and second tablet three days later if symptoms persist.   Contact with any new or worsening symptoms   HPI: 63 yo female patient contact regarding vaginal itching that began three days ago and has becoming increasing worse.  Reports an increase in vaginal discharge but no change in normal consistency of this.  She denies any odor.  She has not tried any OTC products for this.  She denies any dysuria, hematuria, sexual activity, recent antibiotic use, abdominal pain or cramping, nausea or vomiting, shortness of breath or chest pain.   I discussed the assessment and treatment plan with the patient. The patient was provided an opportunity to ask questions and all were answered. The patient agreed with the plan and demonstrated an understanding of the  instructions.   The patient was advised to call back or seek an in-person evaluation if the symptoms worsen or if the condition fails to improve as anticipated.   *Service was delivered via asynchronous telecommunications system.  Discussed med's effects and SE's.   Over 20 minutes of counseling, chart review, and critical decision making was performed.   Current Medications:  Current Outpatient Medications on File Prior to Visit  Medication Sig  . acetaminophen (TYLENOL) 500 MG tablet Take 1,000 mg by mouth every 6 (six) hours as needed for mild pain.  Marland Kitchen ALPRAZolam (XANAX) 0.5 MG tablet TAKE 1/2-1 TABLET 2-3 X DAY. ONLY IF NEEDED/ANXIETY ATTACK. LIMIT TO 5 DAYS /WEEK TO AVOID ADDICTION  . buPROPion (WELLBUTRIN XL) 150 MG 24 hr tablet TAKE 1 TABLET EVERY MORNING FOR MOOD.  Marland Kitchen busPIRone (BUSPAR) 15 MG tablet Take 1/2 to 1 tablet 2 to 3 x /day as needed for Anxiety.  . Cholecalciferol (VITAMIN D) 2000 UNITS CAPS Take 1 capsule by mouth daily.  Marland Kitchen escitalopram (LEXAPRO) 20 MG tablet Take one and a half tablets (30mg ) daily for mood & anxiety.  . hydroxychloroquine (PLAQUENIL) 200 MG tablet TAKE 2 TABLETS BY MOUTH EVERY DAY  . linaclotide (LINZESS) 290 MCG CAPS capsule Take 290 mcg by mouth daily as needed (constipation).   . Magnesium 500 MG TABS Take 500 mg by mouth daily.   . meloxicam (MOBIC) 15 MG tablet TAKE 1 TAB DAILY W/FOOD FOR 2 WEEKS, THEN AS NEEDED, CAN TAKE W/TYLENOL, DON'T TAKE W/ALEVE OR IBU   No current facility-administered medications on file prior  to visit.      Allergies:  Allergies  Allergen Reactions  . Bactrim [Sulfamethoxazole-Trimethoprim]     GI upset  . Cymbalta [Duloxetine Hcl]     Sweating  . Acyclovir And Related Itching     Medical History:  Past Medical History:  Diagnosis Date  . Anxiety   . Arthritis    pt denies  . Cancer of kidney ([place])    left kidney removed  . Chronic constipation 07/14/2006   Qualifier: Diagnosis of  By: Drucie Ip     . Cystocele   . Depression   . Enterocele   . Hypertension    Labile  pt. denies no meds  . Lupus ([place])   . Prediabetes   . Pulmonary embolism ([place]) 10/09/2017  . Renal cell carcinoma ([place]) 10/09/2017  . RLS (restless legs syndrome) 01/24/2015   Per sleep study 05/2014   . Seizure ([place])   . Sleep apnea   . Vitamin D deficiency    Family history- Reviewed and unchanged Social history- Reviewed and unchanged   Review of Systems:   Review of Systems  Constitutional: Negative for chills, diaphoresis, fever, malaise/fatigue and weight loss.  Respiratory: Negative for cough and shortness of breath.   Cardiovascular: Negative for chest pain, palpitations, orthopnea, claudication, leg swelling and PND.  Gastrointestinal: Negative for abdominal pain, blood in stool, constipation, diarrhea, heartburn, melena, nausea and vomiting.  Genitourinary: Negative for dysuria, flank pain, frequency, hematuria and urgency.  Skin: Positive for itching.       vaginal  Neurological: Negative for dizziness, tingling, tremors, sensory change, speech change, focal weakness, seizures, loss of consciousness, weakness and headaches.      Physical Exam:   No physical exam as communication was electronic.     Garnet Sierras, NP [facility] 08/14/2018  12:35 PM

## 2018-08-15 ENCOUNTER — Telehealth: Payer: Self-pay

## 2018-08-15 NOTE — Telephone Encounter (Signed)
-----   Message from Vicie Mutters, [place] sent at 08/14/2018  4:54 PM EDT ----- Regarding: RE: med reaction Contact: [phone] She is fine to take the medication.  Estill Bamberg ----- Message ----- From: Elenor Quinones, CMA Sent: 08/14/2018   3:29 PM EDT To: Vicie Mutters, PA-C Subject: med reaction                                   Phone call from [facility] w/ [facility] [place] [facility]:  FLUCONAZOLE & HYDROXYCHLORQUINE has a interaction with each other.  Please advise

## 2018-08-15 NOTE — Telephone Encounter (Signed)
Spoke with pharmacy in order to inform that patient is okay to take med together

## 2018-08-16 MED ORDER — FLUCONAZOLE 150 MG PO TABS: ORAL_TABLET | ORAL | 0 refills | Status: DC

## 2018-09-10 ENCOUNTER — Other Ambulatory Visit: Payer: Self-pay | Admitting: Internal Medicine

## 2018-09-10 ENCOUNTER — Other Ambulatory Visit: Payer: Self-pay | Admitting: Physician Assistant

## 2018-09-10 DIAGNOSIS — F419 Anxiety disorder, unspecified: Secondary | ICD-10-CM

## 2018-09-16 ENCOUNTER — Other Ambulatory Visit: Payer: Self-pay | Admitting: Internal Medicine

## 2018-09-16 DIAGNOSIS — Z79899 Other long term (current) drug therapy: Secondary | ICD-10-CM

## 2018-09-16 DIAGNOSIS — F3341 Major depressive disorder, recurrent, in partial remission: Secondary | ICD-10-CM

## 2018-09-16 DIAGNOSIS — F419 Anxiety disorder, unspecified: Secondary | ICD-10-CM

## 2018-09-22 ENCOUNTER — Encounter: Payer: Self-pay | Admitting: Adult Health Nurse Practitioner

## 2018-09-22 ENCOUNTER — Ambulatory Visit: Payer: BC Managed Care – PPO | Admitting: Adult Health Nurse Practitioner

## 2018-09-22 ENCOUNTER — Other Ambulatory Visit: Payer: Self-pay

## 2018-09-22 ENCOUNTER — Other Ambulatory Visit: Payer: Self-pay | Admitting: Adult Health Nurse Practitioner

## 2018-09-22 DIAGNOSIS — E66811 Obesity, class 1: Secondary | ICD-10-CM

## 2018-09-22 DIAGNOSIS — E785 Hyperlipidemia, unspecified: Secondary | ICD-10-CM | POA: Diagnosis not present

## 2018-09-22 DIAGNOSIS — E559 Vitamin D deficiency, unspecified: Secondary | ICD-10-CM | POA: Diagnosis not present

## 2018-09-22 DIAGNOSIS — M329 Systemic lupus erythematosus, unspecified: Secondary | ICD-10-CM

## 2018-09-22 DIAGNOSIS — E669 Obesity, unspecified: Secondary | ICD-10-CM

## 2018-09-22 DIAGNOSIS — K5909 Other constipation: Secondary | ICD-10-CM

## 2018-09-22 DIAGNOSIS — F3341 Major depressive disorder, recurrent, in partial remission: Secondary | ICD-10-CM

## 2018-09-22 MED ORDER — PHENTERMINE HCL 37.5 MG PO CAPS: 37.5000 mg | ORAL_CAPSULE | ORAL | 1 refills | Status: DC

## 2018-09-22 NOTE — Progress Notes (Signed)
Discuss medications:  I connected with  Orland Jarred on 09/22/18 by a video enabled telemedicine application and verified that I am speaking with the correct person using two identifiers.   I discussed the limitations of evaluation and management by telemedicine. The patient expressed understanding and agreed to proceed.     Assessment and Plan:  Diagnoses and all orders for this visit:  Vitamin D deficiency Continue supplementation Will check next office visit  Hyperlipidemia, unspecified hyperlipidemia type Discussed dietary and exercise modifications  Obesity (BMI 30.0-34.9) Discussed dietary and exercise modifications at length Make small changes weekly with diet for long term effectiveness.  The prescriptive medication ONLY WORKS IF YOU ARE READY TO CHANGE YOUR HABITS. START WALKING!  Pick a distance and increase this weekly.  Has taken in past with, no adverse effects. RX: phentermine 37.5 MG capsule; Take 1 capsule (37.5 mg total) by mouth every morning. Has follow up in two weeks at our office Assess effectiveness/readiness  Chronic constipation Increase water intake Discussed colace for this Activity such as walking will also help  Systemic lupus erythematosus, unspecified SLE type, unspecified organ involvement status ([place]) Monitor symptoms Could be start of flare, patient to contact office  Recurrent major depressive disorder, in partial remission ([place]) Continue medications Discussed at length Wellbutrin 150mg XL consider increasing dose? Concern one kidney, SR BID? Continue lexapro 20mg  STOP buspar as reports this is not helping.   . Further disposition pending results of labs. Discussed med's effects and SE's.   Over 30 minutes of exam, counseling, chart review, and critical decision making was performed.   Future Appointments  Date Time Provider [place]  10/04/2018 10:45 AM Vicie Mutters, PA-C [facility] None  02/14/2019 10:00 AM Vicie Mutters, PA-C [facility] None    ------------------------------------------------------------------------------------------------------------------   HPI 63 y.o.female presents via telephone conversation to discuss medications.  She has been feeling fatigued that has been gradually increasing.  She reports she feels good when going to bed but when she wakes up she has a hard time getting going in the morning.  She also reports she has had weight gain related to difficulty increasing her activity related to her rectocele.  She is also concerned that the antidepressants are causing this.  She was participating in pelvic floor therapy and this was interrupted by COVID19 restrictions.  She also feels like this is worsening her mood.  She has history of Lupus and also wondering if she is at the start of a flare, which can be triggered by stress.  She denies any chest pains, shortness of breath, muscle weakness, numbness tingling, dizziness, syncope, SI/HI. She reports that her diet has not been good and not been making as healthy of choices as she should.  She really wants to work on this.   Past Medical History:  Diagnosis Date  . Anxiety   . Arthritis    pt denies  . Cancer of kidney ([place])    left kidney removed  . Chronic constipation 07/14/2006   Qualifier: Diagnosis of  By: Drucie Ip    . Cystocele   . Depression   . Enterocele   . Hypertension    Labile  pt. denies no meds  . Lupus ([place])   . Prediabetes   . Pulmonary embolism ([place]) 10/09/2017  . Renal cell carcinoma ([place]) 10/09/2017  . RLS (restless legs syndrome) 01/24/2015   Per sleep study 05/2014   . Seizure ([place])   . Sleep apnea   . Vitamin D deficiency  Allergies  Allergen Reactions  . Bactrim [Sulfamethoxazole-Trimethoprim]     GI upset  . Cymbalta [Duloxetine Hcl]     Sweating  . Acyclovir And Related Itching    Current Outpatient Medications on File Prior to Visit  Medication Sig  . acetaminophen (TYLENOL)  500 MG tablet Take 1,000 mg by mouth every 6 (six) hours as needed for mild pain.  Marland Kitchen ALPRAZolam (XANAX) 0.5 MG tablet TAKE 1/2-1 TABLET 2-3 X DAY. ONLY IF NEEDED/ANXIETY ATTACK. LIMIT TO 5 DAYS /WEEK TO AVOID ADDICTION  . buPROPion (WELLBUTRIN XL) 150 MG 24 hr tablet TAKE 1 TABLET EVERY MORNING FOR MOOD.  Marland Kitchen busPIRone (BUSPAR) 15 MG tablet TAKE 1/2 TO 1 TABLET 2 TO 3 X /DAY AS NEEDED FOR ANXIETY.  . Cholecalciferol (VITAMIN D) 2000 UNITS CAPS Take 1 capsule by mouth daily.  Marland Kitchen escitalopram (LEXAPRO) 20 MG tablet Take 1 tablet Daily for Mood  . fluconazole (DIFLUCAN) 150 MG tablet Take one tablet at onset of symptoms and second tablet three days later if symptoms persist.  . fluconazole (DIFLUCAN) 150 MG tablet Take one tablet at onset of symptoms and second tablet three days later for yeast.  . hydroxychloroquine (PLAQUENIL) 200 MG tablet TAKE 2 TABLETS BY MOUTH EVERY DAY  . linaclotide (LINZESS) 290 MCG CAPS capsule Take 290 mcg by mouth daily as needed (constipation).   . Magnesium 500 MG TABS Take 500 mg by mouth daily.   . meloxicam (MOBIC) 15 MG tablet TAKE 1 TAB DAILY W/FOOD FOR 2 WEEKS, THEN AS NEEDED, CAN TAKE W/TYLENOL, DON'T TAKE W/ALEVE OR IBU  . MYRBETRIQ 50 MG TB24 tablet Take 50 mg by mouth daily.  Marland Kitchen terconazole (TERAZOL 7) 0.4 % vaginal cream Place 1 applicator vaginally at bedtime.   No current facility-administered medications on file prior to visit.     ROS: all negative except above.   Physical Exam:  There were no vitals taken for this visit.  General : Well sounding patient in no apparent distress HEENT: no hoarseness, no cough for duration of visit Lungs: speaks in complete sentences, no audible wheezing, no apparent distress Neurological: alert, oriented x 3 Psychiatric: pleasant, judgement appropriate   Garnet Sierras, NP 09/22/18      11:33 AM [facility]

## 2018-09-25 ENCOUNTER — Telehealth: Payer: Self-pay | Admitting: *Deleted

## 2018-09-25 NOTE — Telephone Encounter (Signed)
Per the [employer] pharmacist, the patient paid cash for her Phentermine.  No prior authorization needed.

## 2018-09-26 ENCOUNTER — Other Ambulatory Visit: Payer: Self-pay

## 2018-09-28 NOTE — Patient Instructions (Signed)
   We have sent in prescription for phentermine 37.5mg .  Take one tablet daily before first meal of the day.  This medication is MOST effective along with dietary and exercise lifestyle changes.  Make small dietary changes, one at a time.  This weill increase your success!  For example if your drink soda, removed this and replace with something healthier.  OR if you drink several a day cut the number of sodas.  Try to think about replacing unhealthy food items for something healthier.  Not that you CAN'T have it, you are replacing it.  Start walking every day.  Pick a distance and stick with it for a solid week.  After that increase your distance slowly each week.  Some walking is better than no walking at all.  It also help with our mental health.  You have a follow up appointment scheduled and we can see how you are doing at this time.  If you have any questions or concerns please contact the office via phone or [place].

## 2018-10-03 NOTE — Progress Notes (Signed)
Assessment and Plan: Fatigue Recheck labs Likely more related to sleep Last sleep study 5 years ago States she has fatigue with waking, has fatigue during the day, worse with exertion.  No CP, SOB Had only 50 mins in REM last sleep study-? Non restorative sleep and if she would benefit from nugivil or adderall? Phentermine helps depression/symptoms.   Essential hypertension - continue to monitor, no medications at this time  DASH diet, exercise and monitor at home. Call if greater than 130/80.  -     CBC with Differential/Platelet -     COMPLETE METABOLIC PANEL WITH GFR -     TSH -     Urinalysis, Routine w reflex microscopic -     Microalbumin / creatinine urine ratio  OSA (obstructive sleep apnea) Sleep apnea- continue CPAP, Using nightly CPAP is helping with daytime fatigue,  weight loss still advised Discussed mask hygiene, uses SoClean product for this.  Personal history of renal cell carcinoma S/p nephrectomy and doing well  last saw in Feb Oncology d/c Eliquis 11/19 Now follows with urology, Dr Matilde Sprang  History of pulmonary embolus S/P nephrectomy Has completed Eliquis therapy and D/c'd 11/19 Doing well, asymptomatic Continue to monitor  Systemic lupus erythematosus, unspecified SLE type, unspecified organ involvement status ([place]) Continue meds Follow up with Dr Amil Amen  Hyperlipidemia, unspecified hyperlipidemia type .Discussed dietary and exercise modifications decrease fatty foods increase activity.  -     Lipid panel  Obesity (BMI 30.0-34.9) - follow up 3 months for progress monitoring - increase veggies, decrease carbs Continue phentermine but may benefit from adderall, will switch - long discussion about weight loss, diet, and exercise Wants to increase activity after rectocele management, impeding this progress  Recurrent major depressive disorder, in partial remission ([place]) - continue medications: Wellbutrin XL 150mg  daily and Lexapro 1  tablets (20)mg daily stress management techniques discussed, increase water, good sleep hygiene discussed, increase exercise, and increase veggies.  May still benefit from psych referral  Anxiety: Managing, has decreased xanax 1-2 x a day at the most  Rectocele Following with Dr Marvel Plan GYN and Dr Matilde Sprang, Urology Would still like a repair.   Vitamin D deficiency -     VITAMIN D 25 Hydroxy (Vit-D Deficiency)  Abnormal glucose Discussed dietary and exercise modifications Discussed disease progression and risks -     Hemoglobin A1c       - Insulin   Discussed med's effects and SE's. Screening labs and tests as requested with regular follow-up as recommended. Future Appointments  Date Time Provider [place]  02/14/2019 10:00 AM Vicie Mutters, PA-C [facility] None    HPI Patient presents for 88month follow.  She has rectocele, has constant heaviness from her vagina, has rectal pain, constipation, back pain, MRI showed 2.8 cm anterior rectal wall recectocele.  referred to Dr. Marvel Plan, GYN and Dr Bernerd Limbo, Urology.  Her bilateral legs feel heavy.  Reports she is still has heaviness and feels like things are going to fall out  She is taking Merbetriq daily which has helped with her urinary symptoms but still urinating frequently.   She underwent left laparoscopic nephrectomy by Dr. Link Snuffer on 09/19/2017 for left renal mass and carcinoma, had subsequent PE 10/09/2017, on eliquis. She has since discontinued this in 03/2018 after 6 months of therapy.  She has depression and anxiety, following with [name], on lexapro, xanax 1-2 a day AS needed and wellbutrin. She was taken off buspar due to fatigue. Her sister passed in Nov,  2 years younger.   SHE CONTINUES TO HAVE FATIGUE FIRST THING IN THE AM AND WORSE WITH ACTIVE. Sleep study was 06/01/2013. She weighed 175 at that time had RLS and non REM. Still gets up once a night.  No vision changes. No CP, SOB. CTA chest  09/2017 and CT AB 09/2017. Echo 09/2017 - Pulmonary arteries: Systolic pressure was mildly to moderately   increased. PA peak pressure: 52 mm Hg (S). She is on CPAP, last done 2-3 years ago.  Lab Results  Component Value Date   [id number] 902 06/27/2018   Lab Results  Component Value Date   IRON 57 08/10/2017   TIBC 319 08/10/2017   FERRITIN 103 07/19/2016     Patient denies chest pain, shortness of breath, dizziness. BP: 134/78   Patient's cholesterol is diet controlled. The patient's cholesterol last visit was  Lab Results  Component Value Date   CHOL 175 06/27/2018   HDL 48 (L) 06/27/2018   LDLCALC 111 (H) 06/27/2018   TRIG 72 06/27/2018   CHOLHDL 3.6 06/27/2018   The patient has been working on diet and exercise for prediabetes, denies changes in vision, polys, and paresthesias. Last A1C in office was Lab Results  Component Value Date   HGBA1C 5.8 (H) 06/27/2018   Vitamin D deficiency, on vitamin d Lab Results  Component Value Date   VD25OH 49 06/27/2018   She has SLE and follows with [name], on plaquenil.  BMI is Body mass index is 35.23 kg/m., she is working on diet and exercise. Wt Readings from Last 3 Encounters:  10/04/18 192 lb 9.6 oz (87.4 kg)  06/27/18 190 lb 9.6 oz (86.5 kg)  05/02/18 189 lb 4.8 oz (85.9 kg)    Current Medications:  Current Outpatient Medications on File Prior to Visit  Medication Sig Dispense Refill  . acetaminophen (TYLENOL) 500 MG tablet Take 1,000 mg by mouth every 6 (six) hours as needed for mild pain.    [facility] ALPRAZolam (XANAX) 0.5 MG tablet TAKE 1/2-1 TABLET 2-3 X DAY. ONLY IF NEEDED/ANXIETY ATTACK. LIMIT TO 5 DAYS /WEEK TO AVOID ADDICTION 90 tablet 0  . buPROPion (WELLBUTRIN XL) 150 MG 24 hr tablet TAKE 1 TABLET EVERY MORNING FOR MOOD. 90 tablet 1  . Cholecalciferol (VITAMIN D) 2000 UNITS CAPS Take 1 capsule by mouth daily.    [facility] Kitchen escitalopram (LEXAPRO) 20 MG tablet Take 1 tablet Daily for Mood 90 tablet 0  .  hydroxychloroquine (PLAQUENIL) 200 MG tablet TAKE 2 TABLETS BY MOUTH EVERY DAY 180 tablet 1  . linaclotide (LINZESS) 290 MCG CAPS capsule Take 290 mcg by mouth daily as needed (constipation).     . Magnesium 500 MG TABS Take 500 mg by mouth daily.     . meloxicam (MOBIC) 15 MG tablet TAKE 1 TAB DAILY W/FOOD FOR 2 WEEKS, THEN AS NEEDED, CAN TAKE W/TYLENOL, DON'T TAKE W/ALEVE OR IBU 90 tablet 1  . MYRBETRIQ 50 MG TB24 tablet Take 50 mg by mouth daily.    . phentermine 37.5 MG capsule Take 1 capsule (37.5 mg total) by mouth every morning. 30 capsule 1  . terconazole (TERAZOL 7) 0.4 % vaginal cream Place 1 applicator vaginally at bedtime. 45 g 2   No current facility-administered medications on file prior to visit.     Medical History:  Past Medical History:  Diagnosis Date  . Anxiety   . Arthritis    pt denies  . Cancer of kidney ([place])    left kidney removed  .  Chronic constipation 07/14/2006   Qualifier: Diagnosis of  By: Drucie Ip    . Cystocele   . Depression   . Enterocele   . Hypertension    Labile  pt. denies no meds  . Lupus ([place])   . Prediabetes   . Pulmonary embolism ([place]) 10/09/2017  . Renal cell carcinoma ([place]) 10/09/2017  . RLS (restless legs syndrome) 01/24/2015   Per sleep study 05/2014   . Seizure ([place])   . Sleep apnea   . Vitamin D deficiency    Allergies Allergies  Allergen Reactions  . Bactrim [Sulfamethoxazole-Trimethoprim]     GI upset  . Cymbalta [Duloxetine Hcl]     Sweating  . Acyclovir And Related Itching    SURGICAL HISTORY She  has a past surgical history that includes Abdominal hysterectomy; Cesarean section; Laparoscopic nephrectomy, hand assisted (Left, 09/19/2017); and Colonoscopy. FAMILY HISTORY Her family history includes Heart disease in her mother; Hyperlipidemia in her mother; Hypertension in her mother. SOCIAL HISTORY She  reports that she has never smoked. She has never used smokeless tobacco. She reports that she does not drink  alcohol or use drugs.  Review of Systems  Constitutional: Negative for chills, diaphoresis, fever, malaise/fatigue and weight loss.  HENT: Negative for congestion, ear discharge, ear pain, hearing loss, nosebleeds, sinus pain, sore throat and tinnitus.   Eyes: Negative for blurred vision, double vision, photophobia, pain, discharge and redness.  Respiratory: Negative for cough, hemoptysis, sputum production, shortness of breath, wheezing and stridor.   Cardiovascular: Negative for chest pain, palpitations, orthopnea, claudication, leg swelling and PND.  Gastrointestinal: Negative for abdominal pain, blood in stool, constipation, diarrhea, heartburn, melena, nausea and vomiting.  Genitourinary: Negative for dysuria, flank pain, frequency, hematuria and urgency.  Musculoskeletal: Negative for back pain, falls, joint pain, myalgias and neck pain.  Skin: Negative for itching and rash.  Neurological: Negative for dizziness, tingling, tremors, sensory change, speech change, focal weakness, seizures, loss of consciousness, weakness and headaches.  Endo/Heme/Allergies: Negative for environmental allergies and polydipsia. Does not bruise/bleed easily.  Psychiatric/Behavioral: Negative for depression, hallucinations, memory loss, substance abuse and suicidal ideas. The patient is not nervous/anxious and does not have insomnia.     Physical Exam: Estimated body mass index is 35.23 kg/m as calculated from the following:   Height as of this encounter: 5\' 2"  (1.575 m).   Weight as of this encounter: 192 lb 9.6 oz (87.4 kg). Vitals:   10/04/18 1118  BP: 134/78  Temp: (!) 97.3 F (36.3 C)   General Appearance: Well nourished, in no apparent distress. Eyes: PERRLA, EOMs, conjunctiva no swelling or erythema, normal fundi and vessels. Sinuses: No Frontal/maxillary tenderness ENT/Mouth: Ext aud canals clear, normal light reflex with TMs without erythema, bulging.  Good dentition. No erythema, swelling, or  exudate on post pharynx. Tonsils not swollen or erythematous. Hearing normal. Crowded mouth.  Neck: Supple, thyroid normal. No bruits Respiratory: Respiratory effort normal, BS equal bilaterally without rales, rhonchi, wheezing or stridor. Cardio: RRR without murmurs, rubs or gallops. Brisk peripheral pulses without edema.  Chest: symmetric, with normal excursions and percussion. Breasts: defer Abdomen: Soft, obese +BS. Non tender, no guarding, rebound, hernias, masses, or organomegaly. .  Lymphatics: Non tender without lymphadenopathy.  Musculoskeletal: Full ROM all peripheral extremities,5/5 strength, and normal gait. Skin: Warm, dry without rashes, lesions, ecchymosis.  Neuro: Cranial nerves intact, reflexes equal bilaterally. Normal muscle tone, no cerebellar symptoms. Sensation intact.  Psych: Awake and oriented X 3, normal affect, Insight and Judgment appropriate.  Garnet Sierras, NP [facility] 10/04/2018  11:40 AM

## 2018-10-04 ENCOUNTER — Ambulatory Visit: Payer: BC Managed Care – PPO | Admitting: Physician Assistant

## 2018-10-04 ENCOUNTER — Other Ambulatory Visit: Payer: Self-pay

## 2018-10-04 ENCOUNTER — Encounter: Payer: Self-pay | Admitting: Physician Assistant

## 2018-10-04 VITALS — BP 134/78 | Temp 97.3°F | Ht 62.0 in | Wt 192.6 lb

## 2018-10-04 DIAGNOSIS — E785 Hyperlipidemia, unspecified: Secondary | ICD-10-CM | POA: Diagnosis not present

## 2018-10-04 DIAGNOSIS — M329 Systemic lupus erythematosus, unspecified: Secondary | ICD-10-CM

## 2018-10-04 DIAGNOSIS — E66811 Obesity, class 1: Secondary | ICD-10-CM

## 2018-10-04 DIAGNOSIS — C649 Malignant neoplasm of unspecified kidney, except renal pelvis: Secondary | ICD-10-CM

## 2018-10-04 DIAGNOSIS — Z79899 Other long term (current) drug therapy: Secondary | ICD-10-CM

## 2018-10-04 DIAGNOSIS — R5383 Other fatigue: Secondary | ICD-10-CM | POA: Diagnosis not present

## 2018-10-04 DIAGNOSIS — R7303 Prediabetes: Secondary | ICD-10-CM

## 2018-10-04 DIAGNOSIS — E669 Obesity, unspecified: Secondary | ICD-10-CM

## 2018-10-04 DIAGNOSIS — K5909 Other constipation: Secondary | ICD-10-CM

## 2018-10-04 DIAGNOSIS — F3341 Major depressive disorder, recurrent, in partial remission: Secondary | ICD-10-CM

## 2018-10-04 DIAGNOSIS — I10 Essential (primary) hypertension: Secondary | ICD-10-CM | POA: Diagnosis not present

## 2018-10-04 DIAGNOSIS — E559 Vitamin D deficiency, unspecified: Secondary | ICD-10-CM

## 2018-10-05 LAB — CBC WITH DIFFERENTIAL/PLATELET
Absolute Monocytes: 357 cells/uL (ref 200–950)
Basophils Absolute: 9 cells/uL (ref 0–200)
Basophils Relative: 0.3 %
Eosinophils Absolute: 40 cells/uL (ref 15–500)
Eosinophils Relative: 1.3 %
HCT: 40.1 % (ref 35.0–45.0)
Hemoglobin: 13.1 g/dL (ref 11.7–15.5)
Lymphs Abs: 1401 cells/uL (ref 850–3900)
MCH: 29.2 pg (ref 27.0–33.0)
MCHC: 32.7 g/dL (ref 32.0–36.0)
MCV: 89.3 fL (ref 80.0–100.0)
MPV: 9.5 fL (ref 7.5–12.5)
Monocytes Relative: 11.5 %
Neutro Abs: 1293 cells/uL — ABNORMAL LOW (ref 1500–7800)
Neutrophils Relative %: 41.7 %
Platelets: 256 10*3/uL (ref 140–400)
RBC: 4.49 10*6/uL (ref 3.80–5.10)
RDW: 12.9 % (ref 11.0–15.0)
Total Lymphocyte: 45.2 %
WBC: 3.1 10*3/uL — ABNORMAL LOW (ref 3.8–10.8)

## 2018-10-05 LAB — LIPID PANEL
Cholesterol: 150 mg/dL (ref <200)
HDL: 34 mg/dL — ABNORMAL LOW (ref >=50)
LDL Cholesterol (Calc): 99 mg/dL (calc)
Non-HDL Cholesterol (Calc): 116 mg/dL (calc) (ref <130)
Total CHOL/HDL Ratio: 4.4 (calc) (ref <5.0)
Triglycerides: 79 mg/dL (ref <150)

## 2018-10-05 LAB — COMPLETE METABOLIC PANEL WITH GFR
AG Ratio: 1.1 (calc) (ref 1.0–2.5)
ALT: 19 U/L (ref 6–29)
AST: 23 U/L (ref 10–35)
Albumin: 3.9 g/dL (ref 3.6–5.1)
Alkaline phosphatase (APISO): 47 U/L (ref 37–153)
BUN/Creatinine Ratio: 11 (calc) (ref 6–22)
BUN: 12 mg/dL (ref 7–25)
CO2: 23 mmol/L (ref 20–32)
Calcium: 9.1 mg/dL (ref 8.6–10.4)
Chloride: 109 mmol/L (ref 98–110)
Creat: 1.12 mg/dL — ABNORMAL HIGH (ref 0.50–0.99)
GFR, Est African American: 61 mL/min/{1.73_m2} (ref >=60)
GFR, Est Non African American: 52 mL/min/{1.73_m2} — ABNORMAL LOW (ref >=60)
Globulin: 3.6 g/dL (calc) (ref 1.9–3.7)
Glucose, Bld: 89 mg/dL (ref 65–99)
Potassium: 4.7 mmol/L (ref 3.5–5.3)
Sodium: 141 mmol/L (ref 135–146)
Total Bilirubin: 0.3 mg/dL (ref 0.2–1.2)
Total Protein: 7.5 g/dL (ref 6.1–8.1)

## 2018-10-05 LAB — TSH: TSH: 0.97 mIU/L (ref 0.40–4.50)

## 2018-10-05 LAB — HEMOGLOBIN A1C
Hgb A1c MFr Bld: 5.9 % of total Hgb — ABNORMAL HIGH (ref <5.7)
Mean Plasma Glucose: 123 (calc)
eAG (mmol/L): 6.8 (calc)

## 2018-10-09 ENCOUNTER — Other Ambulatory Visit: Payer: Self-pay | Admitting: Physician Assistant

## 2018-10-12 ENCOUNTER — Telehealth: Payer: Self-pay | Admitting: Neurology

## 2018-10-12 LAB — MYASTHENIA GRAVIS PANEL 2
A CHR BINDING ABS: <0.3 nmol/L
ACHR Blocking Abs: <15 % Inhibition (ref <15)
Acetylchol Modul Ab: 1 % Inhibition

## 2018-10-12 NOTE — Telephone Encounter (Signed)

## 2018-10-19 ENCOUNTER — Encounter: Payer: Self-pay | Admitting: *Deleted

## 2018-10-24 ENCOUNTER — Encounter: Payer: Self-pay | Admitting: Neurology

## 2018-10-24 NOTE — Addendum Note (Signed)
Addended by: Darleen Crocker on: 10/24/2018 04:39 PM   Modules accepted: Orders

## 2018-10-24 NOTE — Telephone Encounter (Signed)
Called the patient to review chart, no answer. LVM for the pt to call back and will send a mychart messages.

## 2018-10-24 NOTE — Telephone Encounter (Signed)
Called the patient to review their chart and made sure that everything was up to date. Patient informed they received the e-mail/text message for the visit. Instructed to make sure they hold on to the e-mail/text for the upcoming appointment as it is necessary to access their appointment. Instructed the patient that apx 30 min prior to the appointment the front staff will contact them to make sure they are ready to go for their appointment in case there is any need for troubleshooting it can be completed prior to the appointment time. Reminded the patient once more that this is treated as a Office visit and the patient must be prepared for the visit and ready at the time of their appointment preferably in a well lit area where they have good connection for the visit. Pt verbalized understanding.   [place] is 0.  Pt currently uses a CPAP machine and DME is [facility].

## 2018-10-25 ENCOUNTER — Other Ambulatory Visit: Payer: Self-pay

## 2018-10-25 ENCOUNTER — Encounter: Payer: Self-pay | Admitting: Neurology

## 2018-10-25 ENCOUNTER — Ambulatory Visit (INDEPENDENT_AMBULATORY_CARE_PROVIDER_SITE_OTHER): Payer: BC Managed Care – PPO | Admitting: Neurology

## 2018-10-25 DIAGNOSIS — R0683 Snoring: Secondary | ICD-10-CM | POA: Diagnosis not present

## 2018-10-25 DIAGNOSIS — Z86711 Personal history of pulmonary embolism: Secondary | ICD-10-CM

## 2018-10-25 DIAGNOSIS — N393 Stress incontinence (female) (male): Secondary | ICD-10-CM | POA: Diagnosis not present

## 2018-10-25 DIAGNOSIS — G4733 Obstructive sleep apnea (adult) (pediatric): Secondary | ICD-10-CM

## 2018-10-25 DIAGNOSIS — M329 Systemic lupus erythematosus, unspecified: Secondary | ICD-10-CM | POA: Diagnosis not present

## 2018-10-25 DIAGNOSIS — E669 Obesity, unspecified: Secondary | ICD-10-CM

## 2018-10-25 NOTE — Progress Notes (Signed)
[facility]   Provider:  Larey Seat, MD  Primary Care Physician:  [name], MD   Referring Provider:  above  Virtual Visit via Video Note  I connected with@ on 10/25/18 at  1:00 PM EDT by a video enabled telemedicine application and verified that I am speaking with the correct person using two identifiers.  Location: Patient: at home  Provider: At [facility]   I discussed the limitations of evaluation and management by telemedicine and the availability of in person appointments. The patient expressed understanding and agreed to proceed.  Larey Seat, MD    HPI:  Beth [name] is a 63 y.o. african - [place] female patient , seen virtually in a referral from Dr. Melford Aase for a  sleep evaluation.     Chief complaint according to patient : " my sleep is not refreshing- as it was when I first started on CPAP" .  Sleep and medical history:  Had CPAP for about 5 years now, compliant user. She has a history of left kidney cancer. Lupus, pre diabetes, arthritis on Plaquenil.  PLMs were described I her last sleep study. She had a PE in 10-09-2017 following nephrectomy.Nocturia - Dr Nicki Reaper [place] is treating. She has a rectocele- has PT         Family sleep and medical history: No OSA in immediate family. One sister who just passed 03/2018.   Social history: Patient is part time from home and part from her office.  Daytime 8 -5 .  Non-Smoker, Non Drinker, no caffeine use.  3 people live in the household, you and mother and aunt.   Sleep habits are as follows:  Dinner time is 7 PM, bed time 9.30-10 before she started phentermine. Now sleeps after 10.30 PM.  Struggling to go to sleep since being on weight loss therapy. Uses a sleep aid, OTC. The bedroom is cool,but the TV is on- for much of the night. Sleep on her sides, left preferred, and on 1 flat pillow. She reports are dreams, not vivid. One nocturia.    Review of Systems: Out of a complete 14 system  review, the patient complains of only the following symptoms, and all other reviewed systems are negative.  Her family states she snores loudly- her granddaughter won't sleep in her bed. How likely are you to doze in the following situations: 0 = not likely, 1 = slight chance, 2 = moderate chance, 3 = high chance  Sitting and Reading? Watching Television? Sitting inactive in a public place (theater or meeting)? Lying down in the afternoon when circumstances permit? Sitting and talking to someone? Sitting quietly after lunch without alcohol? In a car, while stopped for a few minutes in traffic? As a passenger in a car for an hour without a break?  Total = 0  Since alertness and diet pill.   Social History   Socioeconomic History  . Marital status: Single    Spouse name: Not on file  . Number of children: 3  . Years of education: Not on file  . Highest education level: Not on file  Occupational History  . Occupation: Network engineer  Social Needs  . Financial resource strain: Not on file  . Food insecurity:    Worry: Not on file    Inability: Not on file  . Transportation needs:    Medical: Not on file    Non-medical: Not on file  Tobacco Use  . Smoking status: Never Smoker  . Smokeless tobacco:  Never Used  Substance and Sexual Activity  . Alcohol use: No  . Drug use: No  . Sexual activity: Not Currently  Lifestyle  . Physical activity:    Days per week: Not on file    Minutes per session: Not on file  . Stress: Not on file  Relationships  . Social connections:    Talks on phone: Not on file    Gets together: Not on file    Attends religious service: Not on file    Active member of club or organization: Not on file    Attends meetings of clubs or organizations: Not on file    Relationship status: Not on file  . Intimate partner violence:    Fear of current or ex partner: Not on file    Emotionally abused: Not on file    Physically abused: Not on file    Forced sexual  activity: Not on file  Other Topics Concern  . Not on file  Social History Narrative   Single - 3 children   Secretary [employer]   Never smoker, No ETOH/drugs    Family History  Problem Relation Age of Onset  . Hyperlipidemia Mother   . Hypertension Mother   . Heart disease Mother     Past Medical History:  Diagnosis Date  . Anxiety   . Arthritis    pt denies  . Cancer of kidney ([place])    left kidney removed  . Chronic constipation 07/14/2006   Qualifier: Diagnosis of  By: Drucie Ip    . Cystocele   . Depression   . Enterocele   . Hypertension    Labile  pt. denies no meds  . Lupus ([place])   . Prediabetes   . Pulmonary embolism ([place]) 10/09/2017  . Renal cell carcinoma ([place]) 10/09/2017  . RLS (restless legs syndrome) 01/24/2015   Per sleep study 05/2014   . Seizure ([place])   . Sleep apnea   . Vitamin D deficiency     Past Surgical History:  Procedure Laterality Date  . ABDOMINAL HYSTERECTOMY    . CESAREAN SECTION     x 3  . COLONOSCOPY     negative per pt, Dr. Alice Reichert - [facility]  . LAPAROSCOPIC NEPHRECTOMY, HAND ASSISTED Left 09/19/2017   Procedure: LEFT HAND ASSISTED LAPAROSCOPIC NEPHRECTOMY;  Surgeon: Lucas Mallow, MD;  Location: [facility];  Service: Urology;  Laterality: Left;    Current Outpatient Medications  Medication Sig Dispense Refill  . acetaminophen (TYLENOL) 500 MG tablet Take 1,000 mg by mouth every 6 (six) hours as needed for mild pain.    [facility] ALPRAZolam (XANAX) 0.5 MG tablet Take 1/2 to 1 tablet 2 to 3 x / day & please try to limit to 5 days /week to avoid addiction (should last at least a month if using judiciously ONLY for Panic /Anxiety Attack) 60 tablet 0  . buPROPion (WELLBUTRIN XL) 150 MG 24 hr tablet TAKE 1 TABLET EVERY MORNING FOR MOOD. 90 tablet 1  . Cholecalciferol (VITAMIN D) 2000 UNITS CAPS Take 1 capsule by mouth daily.    Marland Kitchen escitalopram (LEXAPRO) 20 MG tablet Take 1 tablet Daily for Mood 90 tablet 0  . hydroxychloroquine (PLAQUENIL) 200 MG  tablet TAKE 2 TABLETS BY MOUTH EVERY DAY 180 tablet 1  . linaclotide (LINZESS) 290 MCG CAPS capsule Take 290 mcg by mouth daily as needed (constipation).     . Magnesium 500 MG TABS Take 500 mg by mouth daily.     [facility] Kitchen  MYRBETRIQ 50 MG TB24 tablet Take 50 mg by mouth daily.    . phentermine 37.5 MG capsule Take 1 capsule (37.5 mg total) by mouth every morning. 30 capsule 1   No current facility-administered medications for this visit.     Allergies as of 10/25/2018 - Review Complete 10/24/2018  Allergen Reaction Noted  . Bactrim [sulfamethoxazole-trimethoprim]  05/18/2013  . Cymbalta [duloxetine hcl]  05/18/2013  . Acyclovir and related Itching 12/23/2015    Last Weight:   Observation:  General: The patient is awake, alert and appears not in acute distress. The patient is well groomed. Head: Normocephalic, atraumatic. Neck is supple without ROM restriction.  Mallampati grade :4   Neck circumference:40 cm /14 " Nasal airflow is patent. Retrognathia is seen. Partial dentures Respiratory: Breath holding was possible for 27 seconds. Skin:  Without evidence of facial edema or rash  Neurologic exam : The patient is awake and alert, oriented to place and time.   Attention span & concentration ability appears normal.  Speech is fluent, without  dysarthria, dysphonia or aphasia.  Mood and affect are appropriate.  Cranial nerves: Pupils are equal in size and round.  Extraocular movements  in vertical and horizontal planes intact.  Facial motor strength is symmetric and tongue and uvula move midline. Shoulder shrug was symmetrical.   Motor exam:  Normal muscle bulk and symmetric ROM ( range of movement) in upper/ lower extremities.  Coordination: Rapid alternating movements in the fingers/hands were normal.  Finger-to-nose maneuver demonstrated no evidence of ataxia, dysmetria or tremor.  Gait and station: Patient walks without assistive device -   Assessment and Plan:  Insomnia on  weight loss medications.  Loud snoring,  Obesity.  High risk of OSA.    Follow Up Instructions: I will follow up after HST/ PSG SPLIT.    I discussed the assessment and treatment plan with the patient. The patient was provided an opportunity to ask questions and all were answered. The patient agreed with the plan and demonstrated an understanding of the instructions.   The patient was advised to call back or seek an in-person evaluation if the symptoms worsen or if the condition fails to improve as anticipated.  I provided Anne Ng, MD 4/65/6812, 7:51 PM  Certified in Neurology by [employer] Certified in [place] by [facility] [address] [place], [place] [address]

## 2018-11-11 ENCOUNTER — Other Ambulatory Visit: Payer: Self-pay | Admitting: Internal Medicine

## 2018-11-24 ENCOUNTER — Other Ambulatory Visit: Payer: Self-pay | Admitting: Adult Health Nurse Practitioner

## 2018-11-24 DIAGNOSIS — E669 Obesity, unspecified: Secondary | ICD-10-CM

## 2018-11-26 ENCOUNTER — Other Ambulatory Visit: Payer: Self-pay | Admitting: Internal Medicine

## 2018-11-26 MED ORDER — PHENTERMINE HCL 37.5 MG PO TABS: ORAL_TABLET | ORAL | 0 refills | Status: DC

## 2018-12-04 ENCOUNTER — Other Ambulatory Visit: Payer: Self-pay | Admitting: Internal Medicine

## 2018-12-06 ENCOUNTER — Ambulatory Visit (INDEPENDENT_AMBULATORY_CARE_PROVIDER_SITE_OTHER): Payer: BC Managed Care – PPO | Admitting: Neurology

## 2018-12-06 ENCOUNTER — Other Ambulatory Visit: Payer: Self-pay

## 2018-12-06 DIAGNOSIS — R0683 Snoring: Secondary | ICD-10-CM

## 2018-12-06 DIAGNOSIS — N393 Stress incontinence (female) (male): Secondary | ICD-10-CM

## 2018-12-06 DIAGNOSIS — G4733 Obstructive sleep apnea (adult) (pediatric): Secondary | ICD-10-CM | POA: Diagnosis not present

## 2018-12-06 DIAGNOSIS — M329 Systemic lupus erythematosus, unspecified: Secondary | ICD-10-CM

## 2018-12-06 DIAGNOSIS — Z86711 Personal history of pulmonary embolism: Secondary | ICD-10-CM

## 2018-12-06 DIAGNOSIS — F5104 Psychophysiologic insomnia: Secondary | ICD-10-CM

## 2018-12-06 DIAGNOSIS — E669 Obesity, unspecified: Secondary | ICD-10-CM

## 2018-12-11 ENCOUNTER — Other Ambulatory Visit: Payer: Self-pay | Admitting: Internal Medicine

## 2018-12-11 NOTE — Procedures (Signed)
PATIENT'S NAME:  Beth [name], Beth [name] DOB:      [birth year]      MRN:    [id number]     DATE OF RECORDING: 12/06/2018 REFERRING M.D.:  [name], MD  Study Performed:   Baseline Polysomnogram HISTORY:    This patient connected with me on 10-25-2018 by video. Beth [name] is a 63 year old female patient and on CPAP for about 5 years now, being a compliant user. She has a history of left kidney cancer, lupus erythematosus, pre diabetes, arthritis, on Plaquenil. She had a PE in 10-09-2017 following nephrectomy. Frequent Nocturia. PLMs were described in her last sleep study. She has noted changes in sleep pattern while treated with phentermine. Poor sleep habits, she sleeps with the TV on.  Chief complaint according to patient: "my sleep is not refreshing- not as it was when I first started on CPAP". The patient endorsed the Epworth Sleepiness Scale at 0 points.   The patient's weight 191 pounds with a height of 61.5 (inches), resulting in a BMI of 36.2 kg/m2. The patient's neck circumference measured 14 inches.  CURRENT MEDICATIONS: Tylenol, Xanax, Wellbutrin, Vitamin D, Lexapro, Plaquenil, Linzess, Magnesium, Myrbetriq, Phentermine   PROCEDURE:  This is a multichannel digital polysomnogram utilizing the Somnostar 11.2 system.  Electrodes and sensors were applied and monitored per AASM Specifications.   EEG, EOG, Chin and Limb EMG, were sampled at 200 Hz.  ECG, Snore and Nasal Pressure, Thermal Airflow, Respiratory Effort, CPAP Flow and Pressure, Oximetry was sampled at 50 Hz. Digital video and audio were recorded.      BASELINE STUDY: Lights Out was at 22:03 and Lights On at 04:42.  Total recording time (TRT) was 399 minutes, with a total sleep time (TST) of 186 minutes.   The patient's sleep latency was 131 minutes.  REM latency was 0 minutes.  The sleep efficiency was 46.6 %.     SLEEP ARCHITECTURE: WASO (Wake after sleep onset) was 105 minutes.  There were 39 minutes in Stage N1, 147 minutes  Stage N2, 0 minutes Stage N3 and 0 minutes in Stage REM.  The percentage of Stage N1 was 21.%, Stage N2 was 79.%, Stage N3 was 0% and Stage R (REM sleep) was 0%.  RESPIRATORY ANALYSIS:  There were a total of 59 respiratory events:  34 obstructive apneas, 0 central apneas and 0 mixed apneas with 25 hypopneas. The patient also had additional respiratory event related arousals (RERAs).     The total APNEA/HYPOPNEA INDEX (AHI) was 19.1 /hour.  0 events occurred in REM sleep and 50 events in NREM. The REM AHI was 0 /hour, versus a non-REM AHI of 19/h.  The patient spent 64.5 minutes of total sleep time in the supine position and 122 minutes in non-supine. The supine AHI was 10.3/h versus a non-supine AHI of 23.7/h.  OXYGEN SATURATION & C02:  The Wake baseline 02 saturation was 97%, with the lowest being 82%. Time spent below 89% saturation equaled 10 minutes.    AROUSALS:  The arousals were noted as: 124 were spontaneous, 7 were associated with PLMs, and 41 were associated with respiratory events. The patient had a total of 57 Periodic Limb Movements.  The Periodic Limb Movement (PLM) index was 18.4/h and the PLM Arousal index was 2.3/hour.  Audio and video analysis did not show any abnormal or unusual movements, complex behaviors, phonations or vocalizations.  There were grunting and very loud snoring noted, sounds of gasping for air.  EKG in normal sinus rhythm (  NSR).  IMPRESSION:  1. Mostly Obstructive Sleep Apnea (OSA) at AHI of 19.2/h. There was no REM sleep recorded and no prolonged hypoxemia. Supine sleep was not exacerbating the AHI.  2. Mild hypoxemia of sleep. Since no REM sleep was recorded this could exacerbate when REM sleep is finally entered.  3. 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 Primary Snoring.    RECOMMENDATIONS:  1. Advise full-night, attended, CPAP titration study to optimize therapy.  2. Sleep hygiene needs to be discussed, the patient used her smart  phone as a TV during the sleep study at loud volume - and slept in view of the screen, poorly.    I certify that I have reviewed the entire raw data recording prior to the issuance of this report in accordance with the Standards of Accreditation of the American Academy of Sleep Medicine (AASM)    Larey Seat, MD   -12-11-2018 Diplomat, American Board of Psychiatry and Neurology  Diplomat, American Board of Sleep Medicine Medical Director, [facility] [employer]

## 2018-12-11 NOTE — Addendum Note (Signed)
Addended by: Larey Seat on: 12/11/2018 02:25 PM   Modules accepted: Orders

## 2018-12-12 ENCOUNTER — Telehealth: Payer: Self-pay

## 2018-12-12 NOTE — Telephone Encounter (Signed)
I called pt to discuss her sleep study results. No answer, left a message asking her to call me back. 

## 2018-12-12 NOTE — Telephone Encounter (Signed)
-----   Message from Larey Seat, MD sent at 12/11/2018  2:25 PM EDT ----- Audio and video analysis did not show any abnormal or unusual movements, complex behaviors, phonations or vocalizations.  There were grunting and very loud snoring noted, sounds of gasping for air.  EKG in normal sinus rhythm (NSR). The patient slept for only half of the recorded time.   IMPRESSION:  1. Mostly Obstructive Sleep Apnea (OSA) at AHI of 19.2/h. There was no REM sleep recorded and no prolonged hypoxemia. Supine sleep was not exacerbating the AHI.  2. Mild hypoxemia of sleep. Since no REM sleep was recorded this could exacerbate when REM sleep is finally entered.  3. 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 Primary Snoring.    RECOMMENDATIONS:  1. Advise full-night, attended, CPAP titration study to optimize therapy.  2. Sleep hygiene needs to be discussed, the patient used her smart phone as a TV during the sleep study at loud volume - and slept in view of the screen, poorly.

## 2018-12-13 ENCOUNTER — Telehealth: Payer: Self-pay | Admitting: Neurology

## 2018-12-13 NOTE — Telephone Encounter (Signed)
I called pt. I advised pt that Dr. Brett Fairy reviewed their sleep study results and found that has moderate sleep apnea and recommends that pt be treated with a cpap. Dr. Brett Fairy recommends that pt return for a repeat sleep study in order to properly titrate the cpap and ensure a good mask fit. Pt is agreeable to returning for a titration study. I advised pt that our sleep lab will file with pt's insurance and call pt to schedule the sleep study when we hear back from the pt's insurance regarding coverage of this sleep study. Pt verbalized understanding of results. Pt had no questions at this time but was encouraged to call back if questions arise. Pt has a machine currently but states its over 5 yrs. Advised that if we order a new machine this titration study will be helpful in ordering the new machine with new pressure.

## 2018-12-13 NOTE — Telephone Encounter (Signed)
-----   Message from Larey Seat, MD sent at 12/11/2018  2:25 PM EDT ----- Audio and video analysis did not show any abnormal or unusual movements, complex behaviors, phonations or vocalizations.  There were grunting and very loud snoring noted, sounds of gasping for air.  EKG in normal sinus rhythm (NSR). The patient slept for only half of the recorded time.   IMPRESSION:  1. Mostly Obstructive Sleep Apnea (OSA) at AHI of 19.2/h. There was no REM sleep recorded and no prolonged hypoxemia. Supine sleep was not exacerbating the AHI.  2. Mild hypoxemia of sleep. Since no REM sleep was recorded this could exacerbate when REM sleep is finally entered.  3. 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 Primary Snoring.    RECOMMENDATIONS:  1. Advise full-night, attended, CPAP titration study to optimize therapy.  2. Sleep hygiene needs to be discussed, the patient used her smart phone as a TV during the sleep study at loud volume - and slept in view of the screen, poorly.

## 2018-12-27 ENCOUNTER — Other Ambulatory Visit: Payer: Self-pay | Admitting: Physician Assistant

## 2019-01-09 ENCOUNTER — Other Ambulatory Visit: Payer: Self-pay | Admitting: Physician Assistant

## 2019-01-12 ENCOUNTER — Other Ambulatory Visit (HOSPITAL_COMMUNITY)
Admission: RE | Admit: 2019-01-12 | Discharge: 2019-01-12 | Disposition: A | Discharge status: Home / Self Care | Payer: BC Managed Care – PPO | Source: Ambulatory Visit | Attending: Neurology | Admitting: Neurology

## 2019-01-12 DIAGNOSIS — Z01812 Encounter for preprocedural laboratory examination: Secondary | ICD-10-CM | POA: Diagnosis not present

## 2019-01-12 DIAGNOSIS — Z20828 Contact with and (suspected) exposure to other viral communicable diseases: Secondary | ICD-10-CM | POA: Insufficient documentation

## 2019-01-12 LAB — SARS CORONAVIRUS 2 (TAT 6-24 HRS): SARS Coronavirus 2: NEGATIVE

## 2019-01-15 ENCOUNTER — Ambulatory Visit (INDEPENDENT_AMBULATORY_CARE_PROVIDER_SITE_OTHER): Payer: BC Managed Care – PPO | Admitting: Neurology

## 2019-01-15 DIAGNOSIS — Z9989 Dependence on other enabling machines and devices: Secondary | ICD-10-CM

## 2019-01-15 DIAGNOSIS — N393 Stress incontinence (female) (male): Secondary | ICD-10-CM

## 2019-01-15 DIAGNOSIS — M329 Systemic lupus erythematosus, unspecified: Secondary | ICD-10-CM

## 2019-01-15 DIAGNOSIS — F5104 Psychophysiologic insomnia: Secondary | ICD-10-CM

## 2019-01-15 DIAGNOSIS — E669 Obesity, unspecified: Secondary | ICD-10-CM

## 2019-01-15 DIAGNOSIS — Z86711 Personal history of pulmonary embolism: Secondary | ICD-10-CM

## 2019-01-15 DIAGNOSIS — R0683 Snoring: Secondary | ICD-10-CM

## 2019-01-15 DIAGNOSIS — G4733 Obstructive sleep apnea (adult) (pediatric): Secondary | ICD-10-CM

## 2019-01-16 DIAGNOSIS — G4733 Obstructive sleep apnea (adult) (pediatric): Secondary | ICD-10-CM | POA: Insufficient documentation

## 2019-01-16 NOTE — Procedures (Signed)
PATIENT'S NAME:  Beth [name], Beth [name] DOB:      [birth year]      MR#:    [id number]     DATE OF RECORDING: 01/15/2019  [facility] REFERRING M.D.:  [name], MD Study Performed:   Titration to Positive Airway Pressure HISTORY:  Beth [name] has been a compliant CPAP user for many years but while seen in a telehealth visit on 10-25-2018 reported changes in her sleep pattern, possibly related to phentermine use.  She is returning for a CPAP Titration sleep study following her diagnostic PSG from 12/06/18, which confirmed OSA is still present  .The patient had an AHI of 19.1/h, non-supine AHI of 23.7/h, and an oxygen nadir of 82% without prolonged desaturation. Patient has also severe PLMs but few related arousals. Loud snoring was noted. Sleep hygiene needs to be discussed, the patient used her smart phone as a TV during the sleep study at loud volume - and slept in view of the screen, poorly.    The patient's weight 192 pounds with a height of 62 (inches), resulting in a BMI of 35.3 kg/m2.  The patient's neck circumference measured 14 inches.  CURRENT MEDICATIONS: Tylenol, Xanax, Wellbutrin, Vitamin D, Lexapro, Plaquenil, Linzess, Magnesium, Myrbetriq, Phentermine    PROCEDURE:  This is a multichannel digital polysomnogram utilizing the SomnoStar 11.2 system.  Electrodes and sensors were applied and monitored per AASM Specifications.   EEG, EOG, Chin and Limb EMG, were sampled at 200 Hz.  ECG, Snore and Nasal Pressure, Thermal Airflow, Respiratory Effort, CPAP Flow and Pressure, Oximetry was sampled at 50 Hz. Digital video and audio were recorded.      CPAP was initiated at 5 cmH20 with heated humidity per AASM split night standards and pressure was advanced to 8 cmH20 because of hypopneas, apneas and desaturations.  At a PAP pressure of 8 cmH20, there was a reduction of the AHI to 0.9/h with improvement of sleep apnea, nadir SpO2 was 94% and the patient slept for 133 minutes. A ResMed Airfit P 10 in  medium was given to her.  Lights Out was at 22:08 and Lights On at 04:59. Total recording time (TRT) was 411 minutes, with a total sleep time (TST) of 309 minutes. The patient's sleep latency was 67 minutes. REM latency was 259.5 minutes.  The sleep efficiency was 75.2 %.    SLEEP ARCHITECTURE: WASO (Wake after sleep onset) was 48 minutes. There were 11 minutes in Stage N1, 276.5 minutes Stage N2, 0 minutes Stage N3 and 21.5 minutes in Stage REM.  The percentage of Stage N1 was 3.6%, Stage N2 was 89.5%, Stage N3 was 0% and Stage R (REM sleep) was 7.0%. The sleep architecture was notable for very little REM sleep.     RESPIRATORY ANALYSIS:  There was a total of 7 respiratory events: 0 obstructive apneas, 0 central apneas and 0 mixed apneas with a total of 0 apneas and an apnea index (AI) of 0 /hour. There were 7 hypopneas with a hypopnea index of 1.4/hour. The patient also had 0 respiratory event related arousals (RERAs).      The total APNEA/HYPOPNEA INDEX  (AHI) was 1.4 /hour and the total RESPIRATORY DISTURBANCE INDEX was 1.4 /hour  0 events occurred in REM sleep and 7 events in NREM. The REM AHI was 0 /hour versus a non-REM AHI of 1.5 /hour.  The patient spent 109.5 minutes of total sleep time in the supine position and 200 minutes in non-supine. The supine AHI was 0.0, versus a  non-supine AHI of 2.1.  OXYGEN SATURATION & C02:  The baseline 02 saturation was 96%, with the lowest being 81%. Time spent below 89% saturation equaled 12 minutes.  The arousals were noted as: 23 were spontaneous, 17 were associated with PLMs, and 4 were associated with respiratory events.   The patient had a total of 403 Periodic Limb Movements. The Periodic Limb Movement (PLM) index was 78.3 and the PLM Arousal index was 3.2 /hour. No PLMs in REM sleep.  Audio and video analysis did not show any abnormal or unusual movements, behaviors, phonations or vocalizations.  No bathroom breaks. Loud, rhythmic snoring was noted  until CPAP reached 7 cm water pressure. EKG was in keeping with normal sinus rhythm with isolated PVCs.  DIAGNOSIS Loud Primary Snoring and Obstructive Sleep Apnea responded well to low pressure CPAP, with a final pressure of 8 cm water. The patient was fitted with a ResMed AirFit P-10 Medium mask. Periodic Limb Movement Disorder was severe- yet few arousals were noted.    PLANS/RECOMMENDATIONS: Autotitration CPAP device with heated Humidity and a setting of 5 through 0 cm water with 2 cm EPR and a ResMed AirFit P-10 Medium mask.  Post-study, the patient indicated that sleep was the same as usual.  CPAP therapy complicate is defined as 4 hours or more with nightly use.  All apnea patient should avoid sedatives, hypnotics, and alcoholic beverage consumption before bedtime.  A follow up appointment will be scheduled in the Sleep Clinic at [facility].   Please call [phone] with any questions.      I certify that I have reviewed the entire raw data recording prior to the issuance of this report in accordance with the Standards of Accreditation of the American Academy of Sleep Medicine (AASM)  Larey Seat, M.D.  01-16-2019 Diplomat, American Board of Psychiatry and Neurology  Diplomat, [place] of Sleep Medicine Medical Director, [place] [facility] at [facility]

## 2019-01-16 NOTE — Addendum Note (Signed)
Addended by: Larey Seat on: 01/16/2019 06:41 PM   Modules accepted: Orders

## 2019-01-17 ENCOUNTER — Telehealth: Payer: Self-pay

## 2019-01-17 NOTE — Telephone Encounter (Signed)
I called pt. I advised pt that Dr. Brett Fairy reviewed their sleep study results and found that pt did well with the cpap during her latest sleep study. Dr. Rexene Alberts recommends that pt start an auto pap at home. I reviewed PAP compliance expectations with the pt. Pt is agreeable to starting a CPAP. I advised pt that an order will be sent to a DME, [facility], and [facility] will call the pt within about one week after they file with the pt's insurance. [facility] will show the pt how to use the machine, fit for masks, and troubleshoot the CPAP if needed. A follow up appt was made for insurance purposes with Amy, NP on 03/28/2019 at 11:00am. Pt verbalized understanding to arrive 15 minutes early and bring their CPAP. A letter with all of this information in it will be mailed to the pt as a reminder. I verified with the pt that the address we have on file is correct. Pt verbalized understanding of results. Pt had no questions at this time but was encouraged to call back if questions arise. I have sent the order to [facility] and have received confirmation that they have received the order.

## 2019-01-17 NOTE — Telephone Encounter (Signed)
-----   Message from Larey Seat, MD sent at 01/16/2019  6:41 PM EDT ----- DIAGNOSIS  Loud Primary Snoring and Obstructive Sleep Apnea responded well  to low pressure CPAP, with a final pressure of 8 cm water. The  patient was fitted with a ResMed AirFit P-10 Medium mask.  Periodic Limb Movement Disorder was severe- yet few arousals were  noted.    PLANS/RECOMMENDATIONS:  Autotitration CPAP device with heated Humidity and a setting of 5  through 10 cm water with 2 cm EPR and a ResMed AirFit P-10 Medium  mask.

## 2019-02-08 ENCOUNTER — Other Ambulatory Visit: Payer: Self-pay | Admitting: Physician Assistant

## 2019-02-12 NOTE — Progress Notes (Signed)
Complete Physical  Assessment and Plan:  Chronic constipation Continue linzess.   Polyneuropathy Bilateral decreased sensation to monofilament test, no muscular weakness.  Some bilateral hip weakness/pain with standing long time.  Normal B12, normal RPR, HIV.  May need EMG studies Normal MRI lumbar in 2007 Increase exercise, follow up neuro, will monitor  Supraclavicular fossa fullness -     US Soft Tissue Head/Neck; Future  Essential hypertension - continue medications, DASH diet, exercise and monitor at home. Call if greater than 130/80.  -     CBC with Differential/Platelet -     COMPLETE METABOLIC PANEL WITH GFR -     TSH -     Urinalysis, Routine w reflex microscopic -     Microalbumin / creatinine urine ratio  OSA (obstructive sleep apnea) Sleep apnea- continue CPAP, CPAP is helping with daytime fatigue, weight loss still advised.   Renal cell carcinoma, unspecified laterality (HCC) S/p nephrectomy and doing well  Systemic lupus erythematosus, unspecified SLE type, unspecified organ involvement status ([place]) Continue meds and follow up Encourage follow up, has not been seen in 3 years.  Having some fatigue, leg pain, rule out association with lupus  Abnormal glucose Discussed disease progression and risks Discussed diet/exercise, weight management and risk modification -     Hemoglobin A1c  Hyperlipidemia, unspecified hyperlipidemia type check lipids decrease fatty foods increase activity.  -     Lipid panel  Vitamin D deficiency -     VITAMIN D 25 Hydroxy (Vit-D Deficiency, Fractures)  Obesity (BMI 30.0-34.9) - follow up 3 months for progress monitoring - increase veggies, decrease carbs - stop phentermine, not helping with weight loss.  - long discussion about weight loss, diet, and exercise  Medication management -     Magnesium  RLS (restless legs syndrome) Increase walking  H/O left nephrectomy Well healed and doing well Has very small  ventral hernia, has follow up tomorrow.   Recurrent major depressive disorder, in partial remission ([place]) - continue medications, stress management techniques discussed, increase water, good sleep hygiene discussed, increase exercise, and increase veggies.   Anxiety Continue meds, will try to decrease xanax use  History of pulmonary embolus (PE) Provoked, off anticoagulant at this time though patient reminds high risk with lupus, will monitor closely.   Rectocele She is following with urology, Dr. Marney Doctor Doing pelvic floor PT for that.   Discussed med's effects and SE's. Screening labs and tests as requested with regular follow-up as recommended. Future Appointments  Date Time Provider [place]  03/28/2019 11:00 AM Lomax, Amy, NP [facility] None  02/14/2020 10:00 AM Vicie Mutters, PA-C [facility] None    HPI Patient presents for complete physical.    She has rectocele, doing pelvic floor PT, states this is not helping. She is following with urology. Has followed with Dr. Zigmund Daniel. Zanaflex states this has helped some.   She has SLE and follows with [name], on plaquenil.Has not been followed up x 3 years. She complains of fatigue, she complains of bilateral leg numbness/tingling in bilateral feet. Went to pain management based off commercial but states was too expensive.  No rashes other than to recent ABX use from dentist. No GI issues with linzess.  No chest pain, SOB, palpitations.   Lab Results  Component Value Date   [id number] 06/27/2018  HIV negative, neg RPR 2016.   States she has noticed supraclavicular fullness/swelling on the left x 2 months. No neck, shoulder, arm pain. No cough, wheezing, SOB.  Not up to date on her MGM.  BMI is Body mass index is 35.67 kg/m., she is working on diet and exercise.She is on phentermine but has not lost weight, states it is helping with energy but not with weight loss. Will stop.  Wt Readings from Last 3 Encounters:   02/14/19 195 lb (88.5 kg)  10/04/18 192 lb 9.6 oz (87.4 kg)  06/27/18 190 lb 9.6 oz (86.5 kg)   She underwent left laparoscopic nephrectomy by Dr. Link Snuffer on 09/19/2017 for left renal mass and carcinoma, had subsequent provoked PE.    She has depression and anxiety, was following with [name].  She is feeling that she is doing better with her anxiety.    Patient denies chest pain, shortness of breath, dizziness. BP: 132/74   Patient's cholesterol is diet controlled. The patient's cholesterol last visit was  Lab Results  Component Value Date   CHOL 150 10/04/2018   HDL 34 (L) 10/04/2018   LDLCALC 99 10/04/2018   TRIG 79 10/04/2018   CHOLHDL 4.4 10/04/2018   The patient has been working on diet and exercise for prediabetes, denies changes in vision, polys, and paresthesias. Last A1C in office was Lab Results  Component Value Date   HGBA1C 5.9 (H) 10/04/2018   Vitamin D deficiency, on vitamin d Lab Results  Component Value Date   VD25OH 49 06/27/2018     Current Medications:  Current Outpatient Medications on File Prior to Visit  Medication Sig Dispense Refill  . acetaminophen (TYLENOL) 500 MG tablet Take 1,000 mg by mouth every 6 (six) hours as needed for mild pain.    Marland Kitchen ALPRAZolam (XANAX) 0.5 MG tablet TAKE 1/2 TO 1 TABLET BY MOUTH 2 X PER DAY, SHOULD LAST LONGER THAN A MONTH, WILL DISCUSS NEXT OV 60 tablet 0  . buPROPion (WELLBUTRIN XL) 150 MG 24 hr tablet TAKE 1 TABLET EVERY MORNING FOR MOOD. 90 tablet 1  . Cholecalciferol (VITAMIN D) 2000 UNITS CAPS Take 1 capsule by mouth daily.    Marland Kitchen escitalopram (LEXAPRO) 20 MG tablet Take 1 tablet Daily for Mood 90 tablet 0  . hydroxychloroquine (PLAQUENIL) 200 MG tablet Take 1 tablet 2 x /day for Arthritis 180 tablet 1  . linaclotide (LINZESS) 290 MCG CAPS capsule Take 290 mcg by mouth daily as needed (constipation).     . Magnesium 500 MG TABS Take 500 mg by mouth daily.     Marland Kitchen MYRBETRIQ 50 MG TB24 tablet Take 50 mg by mouth  daily.    . phentermine (ADIPEX-P) 37.5 MG tablet Take 1/2 to 1 tablet every Morning for Dieting & Weight Loss 90 tablet 0   No current facility-administered medications on file prior to visit.    Health Maintenance:   Immunization History  Administered Date(s) Administered  . Pneumococcal Polysaccharide-23 01/07/2016  . Td 05/19/2003  . Tdap 05/23/2013   TDAP: 2015 Pneumovax: 2017 Flu vaccine: declines Prevnar: N/A Zostavax: N/A  Pap: 2016 neg Luciana Axe, NP MGM: 06/2016- category C- neg DEXA: 2007 normal Colonoscopy: Dr. Alice Reichert in [place] 10/2013 EGD:N/A CT head 2005 CXR 2005 MRI Lumbar spine 2007 Echo 2005 Sleep study 2020  Patient Care Team: [name], MD as PCP - General (Internal Medicine) Signe Colt, MD as Referring Physician (Obstetrics and Gynecology) Kristeen Miss, MD as Consulting Physician (Neurosurgery) Hennie Duos, MD as Consulting Physician (Rheumatology) Efrain Sella, MD as Referring Physician (Internal Medicine) Ara Kussmaul, MD as Consulting Physician (Ophthalmology) Bjorn Loser, MD as Consulting Physician (  Urology)  Medical History:  Past Medical History:  Diagnosis Date  . Anxiety   . Arthritis    pt denies  . Cancer of kidney ([place])    left kidney removed  . Chronic constipation 07/14/2006   Qualifier: Diagnosis of  By: Drucie Ip    . Cystocele   . Depression   . Enterocele   . Hypertension    Labile  pt. denies no meds  . Lupus ([place])   . Prediabetes   . Pulmonary embolism ([place]) 10/09/2017  . Renal cell carcinoma ([place]) 10/09/2017  . RLS (restless legs syndrome) 01/24/2015   Per sleep study 05/2014   . Seizure ([place])   . Sleep apnea   . Vitamin D deficiency    Allergies Allergies  Allergen Reactions  . Bactrim [Sulfamethoxazole-Trimethoprim]     GI upset  . Cymbalta [Duloxetine Hcl]     Sweating  . Acyclovir And Related Itching    SURGICAL HISTORY She  has a past surgical history that  includes Abdominal hysterectomy; Cesarean section; Laparoscopic nephrectomy, hand assisted (Left, 09/19/2017); and Colonoscopy. FAMILY HISTORY Her family history includes Heart disease in her mother; Hyperlipidemia in her mother; Hypertension in her mother. SOCIAL HISTORY She  reports that she has never smoked. She has never used smokeless tobacco. She reports that she does not drink alcohol or use drugs.  Review of Systems  Constitutional: Negative.  Negative for chills and fever.  HENT: Negative.   Eyes: Negative.   Respiratory: Negative.   Cardiovascular: Negative.   Gastrointestinal: Positive for constipation (better with linzess). Negative for abdominal pain, blood in stool, diarrhea, heartburn, melena, nausea and vomiting.  Genitourinary: Positive for frequency and urgency.  Musculoskeletal: Positive for back pain (bilateral lower bacck, tired feeling in bilateral lower hips) and myalgias. Negative for falls, joint pain and neck pain.  Skin: Negative.  Negative for rash.  Neurological: Positive for tingling (bilateral legs). Negative for dizziness, tremors and headaches.  Endo/Heme/Allergies: Negative.   Psychiatric/Behavioral: Negative for depression and memory loss. The patient is not nervous/anxious and does not have insomnia.     Physical Exam: Estimated body mass index is 35.67 kg/m as calculated from the following:   Height as of this encounter: 5\' 2"  (1.575 m).   Weight as of this encounter: 195 lb (88.5 kg). Vitals:   02/14/19 0956  BP: 132/74  Temp: (!) 97.5 F (36.4 C)   General Appearance: Well nourished, in no apparent distress. Eyes: PERRLA, EOMs, conjunctiva no swelling or erythema, normal fundi and vessels. Sinuses: No Frontal/maxillary tenderness ENT/Mouth: Ext aud canals clear, normal light reflex with TMs without erythema, bulging.  Good dentition. No erythema, swelling, or exudate on post pharynx. Tonsils not swollen or erythematous. Hearing normal. Crowded  mouth.  Neck: Supple, thyroid normal. No bruits Respiratory: Respiratory effort normal, BS equal bilaterally without rales, rhonchi, wheezing or stridor. Cardio: RRR without murmurs, rubs or gallops. Brisk peripheral pulses without edema.  Chest: symmetric, with normal excursions and percussion. Breasts: breasts appear normal, no suspicious masses, no skin or nipple changes or axillary nodes. Abdomen: Soft, obese +BS. Non tender, no guarding, rebound, masses, or organomegaly. Very small ventral hernia along well healed vertical abdominal scar, easily retractile.  Lymphatics: Left supraclavicular fullness on the left with mobile nontender nodule. Non tender without lymphadenopathy.  Genitourinary: defer Musculoskeletal: Full ROM all peripheral extremities,5/5 strength, and normal gait. Skin: Warm, dry without rashes, lesions, ecchymosis.  Neuro: Cranial nerves intact, reflexes equal bilaterally. Normal muscle tone, no cerebellar  symptoms. Sensation intact to monofilament bilateral lower legs but decreased diffusely to bilateral knees.  Psych: Awake and oriented X 3, normal affect, Insight and Judgment appropriate.   EKG: WNL, no ST changes.     Vicie Mutters 10:03 AM

## 2019-02-14 ENCOUNTER — Encounter: Payer: Self-pay | Admitting: Physician Assistant

## 2019-02-14 ENCOUNTER — Ambulatory Visit: Payer: BC Managed Care – PPO | Admitting: Physician Assistant

## 2019-02-14 ENCOUNTER — Other Ambulatory Visit: Payer: Self-pay

## 2019-02-14 VITALS — BP 132/74 | Temp 97.5°F | Ht 62.0 in | Wt 195.0 lb

## 2019-02-14 DIAGNOSIS — Z136 Encounter for screening for cardiovascular disorders: Secondary | ICD-10-CM

## 2019-02-14 DIAGNOSIS — Z85528 Personal history of other malignant neoplasm of kidney: Secondary | ICD-10-CM

## 2019-02-14 DIAGNOSIS — Z13220 Encounter for screening for lipoid disorders: Secondary | ICD-10-CM

## 2019-02-14 DIAGNOSIS — F3341 Major depressive disorder, recurrent, in partial remission: Secondary | ICD-10-CM

## 2019-02-14 DIAGNOSIS — E559 Vitamin D deficiency, unspecified: Secondary | ICD-10-CM | POA: Diagnosis not present

## 2019-02-14 DIAGNOSIS — Z0000 Encounter for general adult medical examination without abnormal findings: Secondary | ICD-10-CM

## 2019-02-14 DIAGNOSIS — E785 Hyperlipidemia, unspecified: Secondary | ICD-10-CM

## 2019-02-14 DIAGNOSIS — Z1389 Encounter for screening for other disorder: Secondary | ICD-10-CM

## 2019-02-14 DIAGNOSIS — G629 Polyneuropathy, unspecified: Secondary | ICD-10-CM

## 2019-02-14 DIAGNOSIS — Z0001 Encounter for general adult medical examination with abnormal findings: Secondary | ICD-10-CM

## 2019-02-14 DIAGNOSIS — R7309 Other abnormal glucose: Secondary | ICD-10-CM

## 2019-02-14 DIAGNOSIS — I10 Essential (primary) hypertension: Secondary | ICD-10-CM | POA: Diagnosis not present

## 2019-02-14 DIAGNOSIS — Z905 Acquired absence of kidney: Secondary | ICD-10-CM

## 2019-02-14 DIAGNOSIS — G4733 Obstructive sleep apnea (adult) (pediatric): Secondary | ICD-10-CM

## 2019-02-14 DIAGNOSIS — R222 Localized swelling, mass and lump, trunk: Secondary | ICD-10-CM

## 2019-02-14 DIAGNOSIS — Z1329 Encounter for screening for other suspected endocrine disorder: Secondary | ICD-10-CM | POA: Diagnosis not present

## 2019-02-14 DIAGNOSIS — Z131 Encounter for screening for diabetes mellitus: Secondary | ICD-10-CM | POA: Diagnosis not present

## 2019-02-14 DIAGNOSIS — E669 Obesity, unspecified: Secondary | ICD-10-CM

## 2019-02-14 DIAGNOSIS — M329 Systemic lupus erythematosus, unspecified: Secondary | ICD-10-CM

## 2019-02-14 DIAGNOSIS — E66811 Obesity, class 1: Secondary | ICD-10-CM

## 2019-02-14 DIAGNOSIS — N393 Stress incontinence (female) (male): Secondary | ICD-10-CM

## 2019-02-14 DIAGNOSIS — Z79899 Other long term (current) drug therapy: Secondary | ICD-10-CM | POA: Diagnosis not present

## 2019-02-14 DIAGNOSIS — K5909 Other constipation: Secondary | ICD-10-CM

## 2019-02-14 DIAGNOSIS — Z86711 Personal history of pulmonary embolism: Secondary | ICD-10-CM

## 2019-02-14 DIAGNOSIS — G2581 Restless legs syndrome: Secondary | ICD-10-CM

## 2019-02-14 DIAGNOSIS — Z7901 Long term (current) use of anticoagulants: Secondary | ICD-10-CM

## 2019-02-14 DIAGNOSIS — Z9109 Other allergy status, other than to drugs and biological substances: Secondary | ICD-10-CM

## 2019-02-14 DIAGNOSIS — F419 Anxiety disorder, unspecified: Secondary | ICD-10-CM

## 2019-02-14 NOTE — Patient Instructions (Addendum)
HOW TO SCHEDULE A MAMMOGRAM  [facility]  7 a.m.-6:30 p.m., Monday 7 a.m.-5 p.m., Tuesday-Friday Schedule an appointment by calling [phone].  Follow up with [name].  Schedule the [place]   Use a dropper or use a cap to put peroxide, olive oil,mineral oil or canola oil in the effected ear- 2-3 times a week. Let it soak for 20-30 min then you can take a shower or use a baby bulb with warm water to wash out the ear wax.  Can buy debrox wax removal kit over the counter.  Do not use Qtips  YOU CAN CALL TO MAKE AN ULTRASOUND..  I have put in an order for an ultrasound for you to have You can set them up at your convenience by calling this number [phone] You will likely have the ultrasound at [place] [address]  If you have any issues call our office and we will set this up for you.    Drink 1/2 your body weight in fluid ounces of water daily; drink a tall glass of water 30 min before meals  Don't eat until you're stuffed- listen to your stomach and eat until you are 80% full   Try eating off of a salad plate; wait 10 min after finishing before going back for seconds  Start by eating the vegetables on your plate; aim for 50% of your meals to be fruits or vegetables  Then eat your protein - lean meats (grass fed if possible), fish, beans, nuts in moderation  Eat your carbs/starch last ONLY if you still are hungry. If you can, stop before finishing it all  Avoid sugar and flour - the closer it looks to it's original form in nature, typically the better it is for you  Splurge in moderation - "assign" days when you get to splurge and have the "bad stuff" - I like to follow a 80% - 20% plan- "good" choices 80 % of the time, "bad" choices in moderation 20% of the time  Simple equation is: Calories out > calories in = weight loss - even if you eat the bad stuff, if you limit portions, you will still lose weight     When it comes to  diets, agreement about the perfect plan isn't easy to find, even among the experts. Experts at the [employer] developed an idea known as the Healthy Eating Plate. Just imagine a plate divided into logical, healthy portions.  The emphasis is on diet quality:  Load up on vegetables and fruits - one-half of your plate: Aim for color and variety, and remember that potatoes don't count.  Go for whole grains - one-quarter of your plate: Whole wheat, barley, wheat berries, quinoa, oats, brown rice, and foods made with them. If you want pasta, go with whole wheat pasta.  Protein power - one-quarter of your plate: Fish, chicken, beans, and nuts are all healthy, versatile protein sources. Limit red meat.  The diet, however, does go beyond the plate, offering a few other suggestions.  Use healthy plant oils, such as olive, canola, soy, corn, sunflower and peanut. Check the labels, and avoid partially hydrogenated oil, which have unhealthy trans fats.  If you're thirsty, drink water. Coffee and tea are good in moderation, but skip sugary drinks and limit milk and dairy products to one or two daily servings.  The type of carbohydrate in the diet is more important than the amount.  Some sources of carbohydrates, such as vegetables, fruits, whole grains, and beans-are healthier than others.  Finally, stay active.

## 2019-02-15 ENCOUNTER — Other Ambulatory Visit: Payer: Self-pay | Admitting: Internal Medicine

## 2019-02-15 DIAGNOSIS — Z1231 Encounter for screening mammogram for malignant neoplasm of breast: Secondary | ICD-10-CM

## 2019-02-15 DIAGNOSIS — R5383 Other fatigue: Secondary | ICD-10-CM

## 2019-02-15 DIAGNOSIS — G629 Polyneuropathy, unspecified: Secondary | ICD-10-CM

## 2019-02-15 DIAGNOSIS — M329 Systemic lupus erythematosus, unspecified: Secondary | ICD-10-CM

## 2019-02-15 LAB — MICROALBUMIN / CREATININE URINE RATIO
Creatinine, Urine: 89 mg/dL (ref 20–275)
Microalb Creat Ratio: 2 mcg/mg creat (ref <30)
Microalb, Ur: 0.2 mg/dL

## 2019-02-15 LAB — URINALYSIS, ROUTINE W REFLEX MICROSCOPIC
Bilirubin Urine: NEGATIVE
Glucose, UA: NEGATIVE
Hgb urine dipstick: NEGATIVE
Hyaline Cast: NONE SEEN /LPF
Ketones, ur: NEGATIVE
Nitrite: NEGATIVE
Protein, ur: NEGATIVE
Specific Gravity, Urine: 1.016 (ref 1.001–1.03)
pH: 6.5 (ref 5.0–8.0)

## 2019-02-15 LAB — COMPLETE METABOLIC PANEL WITH GFR
AG Ratio: 1.1 (calc) (ref 1.0–2.5)
ALT: 15 U/L (ref 6–29)
AST: 18 U/L (ref 10–35)
Albumin: 3.9 g/dL (ref 3.6–5.1)
Alkaline phosphatase (APISO): 62 U/L (ref 37–153)
BUN/Creatinine Ratio: 14 (calc) (ref 6–22)
BUN: 18 mg/dL (ref 7–25)
CO2: 26 mmol/L (ref 20–32)
Calcium: 9.6 mg/dL (ref 8.6–10.4)
Chloride: 106 mmol/L (ref 98–110)
Creat: 1.26 mg/dL — ABNORMAL HIGH (ref 0.50–0.99)
GFR, Est African American: 53 mL/min/{1.73_m2} — ABNORMAL LOW (ref >=60)
GFR, Est Non African American: 45 mL/min/{1.73_m2} — ABNORMAL LOW (ref >=60)
Globulin: 3.6 g/dL (calc) (ref 1.9–3.7)
Glucose, Bld: 88 mg/dL (ref 65–99)
Potassium: 4.3 mmol/L (ref 3.5–5.3)
Sodium: 140 mmol/L (ref 135–146)
Total Bilirubin: 0.4 mg/dL (ref 0.2–1.2)
Total Protein: 7.5 g/dL (ref 6.1–8.1)

## 2019-02-15 LAB — CBC WITH DIFFERENTIAL/PLATELET
Absolute Monocytes: 485 cells/uL (ref 200–950)
Basophils Absolute: 20 cells/uL (ref 0–200)
Basophils Relative: 0.6 %
Eosinophils Absolute: 59 cells/uL (ref 15–500)
Eosinophils Relative: 1.8 %
HCT: 38.6 % (ref 35.0–45.0)
Hemoglobin: 12.6 g/dL (ref 11.7–15.5)
Lymphs Abs: 1416 cells/uL (ref 850–3900)
MCH: 29.8 pg (ref 27.0–33.0)
MCHC: 32.6 g/dL (ref 32.0–36.0)
MCV: 91.3 fL (ref 80.0–100.0)
MPV: 9.4 fL (ref 7.5–12.5)
Monocytes Relative: 14.7 %
Neutro Abs: 1320 cells/uL — ABNORMAL LOW (ref 1500–7800)
Neutrophils Relative %: 40 %
Platelets: 269 10*3/uL (ref 140–400)
RBC: 4.23 10*6/uL (ref 3.80–5.10)
RDW: 12.5 % (ref 11.0–15.0)
Total Lymphocyte: 42.9 %
WBC: 3.3 10*3/uL — ABNORMAL LOW (ref 3.8–10.8)

## 2019-02-15 LAB — MAGNESIUM: Magnesium: 1.8 mg/dL (ref 1.5–2.5)

## 2019-02-15 LAB — HEMOGLOBIN A1C
Hgb A1c MFr Bld: 6 % of total Hgb — ABNORMAL HIGH (ref <5.7)
Mean Plasma Glucose: 126 (calc)
eAG (mmol/L): 7 (calc)

## 2019-02-15 LAB — TSH: TSH: 0.74 mIU/L (ref 0.40–4.50)

## 2019-02-15 LAB — LIPID PANEL
Cholesterol: 158 mg/dL (ref <200)
HDL: 35 mg/dL — ABNORMAL LOW (ref >=50)
LDL Cholesterol (Calc): 96 mg/dL (calc)
Non-HDL Cholesterol (Calc): 123 mg/dL (calc) (ref <130)
Total CHOL/HDL Ratio: 4.5 (calc) (ref <5.0)
Triglycerides: 175 mg/dL — ABNORMAL HIGH (ref <150)

## 2019-02-15 LAB — VITAMIN D 25 HYDROXY (VIT D DEFICIENCY, FRACTURES): Vit D, 25-Hydroxy: 59 ng/mL (ref 30–100)

## 2019-02-27 ENCOUNTER — Ambulatory Visit
Admission: RE | Admit: 2019-02-27 | Discharge: 2019-02-27 | Disposition: A | Discharge status: Home / Self Care | Payer: BC Managed Care – PPO | Source: Ambulatory Visit | Attending: Physician Assistant | Admitting: Physician Assistant

## 2019-02-27 ENCOUNTER — Other Ambulatory Visit: Payer: Self-pay

## 2019-02-27 ENCOUNTER — Ambulatory Visit
Admission: RE | Admit: 2019-02-27 | Discharge: 2019-02-27 | Disposition: A | Discharge status: Home / Self Care | Payer: BC Managed Care – PPO | Source: Ambulatory Visit | Attending: Internal Medicine | Admitting: Internal Medicine

## 2019-02-27 DIAGNOSIS — Z1231 Encounter for screening mammogram for malignant neoplasm of breast: Secondary | ICD-10-CM

## 2019-02-27 DIAGNOSIS — R222 Localized swelling, mass and lump, trunk: Secondary | ICD-10-CM

## 2019-03-14 ENCOUNTER — Other Ambulatory Visit (HOSPITAL_COMMUNITY): Payer: Self-pay | Admitting: Urology

## 2019-03-14 ENCOUNTER — Other Ambulatory Visit: Payer: Self-pay

## 2019-03-14 ENCOUNTER — Ambulatory Visit (HOSPITAL_COMMUNITY)
Admission: RE | Admit: 2019-03-14 | Discharge: 2019-03-14 | Disposition: A | Discharge status: Home / Self Care | Payer: BC Managed Care – PPO | Source: Ambulatory Visit | Attending: Urology | Admitting: Urology

## 2019-03-14 DIAGNOSIS — C642 Malignant neoplasm of left kidney, except renal pelvis: Secondary | ICD-10-CM | POA: Insufficient documentation

## 2019-03-27 NOTE — Progress Notes (Signed)
PATIENT: Beth [name] DOB: [birth year]  REASON FOR VISIT: follow up HISTORY FROM: patient  Chief Complaint  Patient presents with  . Follow-up    Initial auto pap f/u. Alone. Rm 2. No new concerns at this time.      HISTORY OF PRESENT ILLNESS: Today 03/28/19 Beth [name] is a 63 y.o. female here today for follow up for OSA on CPAP and periodic limb movement disorder.  She reports doing very well on CPAP therapy.  She has adjusted nicely.  Her only concern is that her mask may be a little small.  She has noticed a little bit of air leak from the bridge of her nose.  She is using a nasal mask.  Previously she had tried nasal pillow.  She is uncertain of which mask is better for her.  She does note significant improvement in sleep and energy levels.  Compliance report dated 02/25/2019 through 03/26/2019 reveals that she is using CPAP every night for compliance of 100%.  Every night she used CPAP greater than 4 hours for compliance 100%.  Average usage was 7 hours and 55 minutes.  AHI was 0.4 on 5 to 10 cm water and an EPR of 2.  Leak noted in the 95th percentile of 19.8.  HISTORY: (copied from Dr Dohmeier's note on 10/25/2018)  HPI:  Beth [name] is a 63 y.o. african - [place] female patient , seen virtually in a referral from Dr. Melford Aase for a  sleep evaluation.     Chief complaint according to patient : " my sleep is not refreshing- as it was when I first started on CPAP" .  Sleep and medical history:  Had CPAP for about 5 years now, compliant user. She has a history of left kidney cancer. Lupus, pre diabetes, arthritis on Plaquenil.  PLMs were described I her last sleep study. She had a PE in 10-09-2017 following nephrectomy.Nocturia - Dr Nicki Reaper [place] is treating. She has a rectocele- has PT      Family sleep and medical history: No OSA in immediate family. One sister who just passed 03/2018.   Social history: Patient is part time from home and part from her office.  Daytime  8 -5 .  Non-Smoker, Non Drinker, no caffeine use.  3 people live in the household, you and mother and aunt.   Sleep habits are as follows:  Dinner time is 7 PM, bed time 9.30-10 before she started phentermine. Now sleeps after 10.30 PM.  Struggling to go to sleep since being on weight loss therapy. Uses a sleep aid, OTC. The bedroom is cool,but the TV is on- for much of the night. Sleep on her sides, left preferred, and on 1 flat pillow. She reports are dreams, not vivid. One nocturia.   REVIEW OF SYSTEMS: Out of a complete 14 system review of symptoms, the patient complains only of the following symptoms, none and all other reviewed systems are negative.  Epworth sleepiness scale: 0  ALLERGIES: Allergies  Allergen Reactions  . Bactrim [Sulfamethoxazole-Trimethoprim]     GI upset  . Cymbalta [Duloxetine Hcl]     Sweating  . Acyclovir And Related Itching    HOME MEDICATIONS: Outpatient Medications Prior to Visit  Medication Sig Dispense Refill  . acetaminophen (TYLENOL) 500 MG tablet Take 1,000 mg by mouth every 6 (six) hours as needed for mild pain.    Marland Kitchen ALPRAZolam (XANAX) 0.5 MG tablet TAKE 1/2 TO 1 TABLET BY MOUTH 2 X PER DAY, SHOULD LAST LONGER  THAN A MONTH, WILL DISCUSS NEXT OV 60 tablet 0  . buPROPion (WELLBUTRIN XL) 150 MG 24 hr tablet TAKE 1 TABLET EVERY MORNING FOR MOOD. 90 tablet 1  . Cholecalciferol (VITAMIN D) 2000 UNITS CAPS Take 1 capsule by mouth daily.    Marland Kitchen escitalopram (LEXAPRO) 20 MG tablet Take 1 tablet Daily for Mood 90 tablet 0  . hydroxychloroquine (PLAQUENIL) 200 MG tablet Take 1 tablet 2 x /day for Arthritis 180 tablet 1  . linaclotide (LINZESS) 290 MCG CAPS capsule Take 290 mcg by mouth daily as needed (constipation).     . Magnesium 500 MG TABS Take 500 mg by mouth daily.     Marland Kitchen MYRBETRIQ 50 MG TB24 tablet Take 50 mg by mouth daily.    . phentermine (ADIPEX-P) 37.5 MG tablet Take 1/2 to 1 tablet every Morning for Dieting & Weight Loss 90 tablet 0   No  facility-administered medications prior to visit.     PAST MEDICAL HISTORY: Past Medical History:  Diagnosis Date  . Anxiety   . Arthritis    pt denies  . Cancer of kidney ([place])    left kidney removed  . Chronic constipation 07/14/2006   Qualifier: Diagnosis of  By: Drucie Ip    . Cystocele   . Depression   . Enterocele   . Hypertension    Labile  pt. denies no meds  . Lupus ([place])   . Prediabetes   . Pulmonary embolism ([place]) 10/09/2017  . Renal cell carcinoma ([place]) 10/09/2017  . RLS (restless legs syndrome) 01/24/2015   Per sleep study 05/2014   . Seizure ([place])   . Sleep apnea   . Vitamin D deficiency     PAST SURGICAL HISTORY: Past Surgical History:  Procedure Laterality Date  . ABDOMINAL HYSTERECTOMY    . CESAREAN SECTION     x 3  . COLONOSCOPY     negative per pt, Dr. Alice Reichert - [facility]  . LAPAROSCOPIC NEPHRECTOMY, HAND ASSISTED Left 09/19/2017   Procedure: LEFT HAND ASSISTED LAPAROSCOPIC NEPHRECTOMY;  Surgeon: Lucas Mallow, MD;  Location: [facility];  Service: Urology;  Laterality: Left;    FAMILY HISTORY: Family History  Problem Relation Age of Onset  . Hyperlipidemia Mother   . Hypertension Mother   . Heart disease Mother     SOCIAL HISTORY: Social History   Socioeconomic History  . Marital status: Single    Spouse name: Not on file  . Number of children: 3  . Years of education: Not on file  . Highest education level: Not on file  Occupational History  . Occupation: Network engineer  Social Needs  . Financial resource strain: Not on file  . Food insecurity    Worry: Not on file    Inability: Not on file  . Transportation needs    Medical: Not on file    Non-medical: Not on file  Tobacco Use  . Smoking status: Never Smoker  . Smokeless tobacco: Never Used  Substance and Sexual Activity  . Alcohol use: No  . Drug use: No  . Sexual activity: Not Currently  Lifestyle  . Physical activity    Days per week: Not on file    Minutes per session: Not on  file  . Stress: Not on file  Relationships  . Social Herbalist on phone: Not on file    Gets together: Not on file    Attends religious service: Not on file    Active member of  club or organization: Not on file    Attends meetings of clubs or organizations: Not on file    Relationship status: Not on file  . Intimate partner violence    Fear of current or ex partner: Not on file    Emotionally abused: Not on file    Physically abused: Not on file    Forced sexual activity: Not on file  Other Topics Concern  . 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  Generalized: Well developed, in no acute distress  Cardiology: normal rate and rhythm, no murmur noted Respiratory: Clear to auscultation bilaterally Mallampati 3+ Neurological examination  Mentation: Alert oriented to time, place, history taking. Follows all commands speech and language fluent Cranial nerve II-XII: Pupils were equal round reactive to light. Extraocular movements were full, visual field were full on confrontational test. Facial sensation and strength were normal. Uvula tongue midline. Head turning and shoulder shrug  were normal and symmetric. Motor: The motor testing reveals 5 over 5 strength of all 4 extremities. Good symmetric motor tone is noted throughout.  Gait and station: Gait is normal.   DIAGNOSTIC DATA (LABS, IMAGING, TESTING) - I reviewed patient records, labs, notes, testing and imaging myself where available.  No flowsheet data found.   Lab Results  Component Value Date   WBC 3.3 (L) 02/14/2019   HGB 12.6 02/14/2019   HCT 38.6 02/14/2019   MCV 91.3 02/14/2019   PLT 269 02/14/2019      Component Value Date/Time   NA 140 02/14/2019 1103   K 4.3  02/14/2019 1103   CL 106 02/14/2019 1103   CO2 26 02/14/2019 1103   GLUCOSE 88 02/14/2019 1103   BUN 18 02/14/2019 1103   CREATININE 1.26 (H) 02/14/2019 1103   CALCIUM 9.6 02/14/2019 1103   PROT 7.5 02/14/2019 1103   ALBUMIN 3.4 (L) 10/09/2017 0947   AST 18 02/14/2019 1103   ALT 15 02/14/2019 1103   ALKPHOS 52 10/09/2017 0947   BILITOT 0.4 02/14/2019 1103   GFRNONAA 45 (L) 02/14/2019 1103   GFRAA 53 (L) 02/14/2019 1103   Lab Results  Component Value Date   CHOL 158 02/14/2019   HDL 35 (L) 02/14/2019   LDLCALC 96 02/14/2019   TRIG 175 (H) 02/14/2019   CHOLHDL 4.5 02/14/2019   Lab Results  Component Value Date   HGBA1C 6.0 (H) 02/14/2019   Lab Results  Component Value Date   VITAMINB12 649 06/27/2018   Lab Results  Component Value Date   TSH 0.74 02/14/2019       ASSESSMENT AND PLAN 63 y.o. year old female  has a past medical history of Anxiety, Arthritis, Cancer of kidney ([place]), Chronic constipation (07/14/2006), Cystocele, Depression, Enterocele, Hypertension, Lupus ([place]), Prediabetes, Pulmonary embolism ([place]) (10/09/2017), Renal cell carcinoma ([place]) (10/09/2017), RLS (restless legs syndrome) (01/24/2015), Seizure ([place]), Sleep apnea, and Vitamin D deficiency. here with     ICD-10-CM   1. OSA on CPAP  G47.33 For home use only DME continuous positive airway pressure (CPAP)   Z99.89   2. Periodic limb movement disorder  G47.61     Latamara reports feeling much better on CPAP therapy.  She is resting deeper and notes increased energy during the day.  Compliance report reveals excellent compliance.  I do think she would benefit from a mask refit.  We will ensure that the mask she has is appropriate and any concerns of leak are addressed.  She was educated on how to monitor this on her machine.  She was encouraged to continue using CPAP therapy nightly and for greater than 4 hours each night.  She will follow-up with me in 6 months, sooner if needed.  She verbalizes understanding  and agreement with this plan.   Orders Placed This Encounter  Procedures  . For home use only DME continuous positive airway pressure (CPAP)    Mask refitting please    Order Specific Question:   Length of Need    Answer:   Lifetime    Order Specific Question:   Patient has OSA or probable OSA    Answer:   Yes    Order Specific Question:   Is the patient currently using CPAP in the home    Answer:   Yes    Order Specific Question:   Settings    Answer:   Other see comments    Order Specific Question:   CPAP supplies needed    Answer:   Mask, headgear, cushions, filters, heated tubing and water chamber     No orders of the defined types were placed in this encounter.     I spent 15 minutes with the patient. 50% of this time was spent counseling and educating patient on plan of care and medications.    [name], FNP-C 03/28/2019, 11:19 AM [facility] [address] [place], [place] [address] [phone]

## 2019-03-27 NOTE — Patient Instructions (Addendum)
We will send you for mask refitting  Continue CPAP nightly and for greater than 4 hours each night   Follow up in 6 months   Sleep Apnea Sleep apnea affects breathing during sleep. It causes breathing to stop for a short time or to become shallow. It can also increase the risk of:  Heart attack.  Stroke.  Being very overweight (obese).  Diabetes.  Heart failure.  Irregular heartbeat. The goal of treatment is to help you breathe normally again. What are the causes? There are three kinds of sleep apnea:  Obstructive sleep apnea. This is caused by a blocked or collapsed airway.  Central sleep apnea. This happens when the brain does not send the right signals to the muscles that control breathing.  Mixed sleep apnea. This is a combination of obstructive and central sleep apnea. The most common cause of this condition is a collapsed or blocked airway. This can happen if:  Your throat muscles are too relaxed.  Your tongue and tonsils are too large.  You are overweight.  Your airway is too small. What increases the risk?  Being overweight.  Smoking.  Having a small airway.  Being older.  Being female.  Drinking alcohol.  Taking medicines to calm yourself (sedatives or tranquilizers).  Having family members with the condition. What are the signs or symptoms?  Trouble staying asleep.  Being sleepy or tired during the day.  Getting angry a lot.  Loud snoring.  Headaches in the morning.  Not being able to focus your mind (concentrate).  Forgetting things.  Less interest in sex.  Mood swings.  Personality changes.  Feelings of sadness (depression).  Waking up a lot during the night to pee (urinate).  Dry mouth.  Sore throat. How is this diagnosed?  Your medical history.  A physical exam.  A test that is done when you are sleeping (sleep study). The test is most often done in a sleep lab but may also be done at home. How is this treated?    Sleeping on your side.  Using a medicine to get rid of mucus in your nose (decongestant).  Avoiding the use of alcohol, medicines to help you relax, or certain pain medicines (narcotics).  Losing weight, if needed.  Changing your diet.  Not smoking.  Using a machine to open your airway while you sleep, such as: ? An oral appliance. This is a mouthpiece that shifts your lower jaw forward. ? A CPAP device. This device blows air through a mask when you breathe out (exhale). ? An EPAP device. This has valves that you put in each nostril. ? A BPAP device. This device blows air through a mask when you breathe in (inhale) and breathe out.  Having surgery if other treatments do not work. It is important to get treatment for sleep apnea. Without treatment, it can lead to:  High blood pressure.  Coronary artery disease.  In men, not being able to have an erection (impotence).  Reduced thinking ability. Follow these instructions at home: Lifestyle  Make changes that your doctor recommends.  Eat a healthy diet.  Lose weight if needed.  Avoid alcohol, medicines to help you relax, and some pain medicines.  Do not use any products that contain nicotine or tobacco, such as cigarettes, e-cigarettes, and chewing tobacco. If you need help quitting, ask your doctor. General instructions  Take over-the-counter and prescription medicines only as told by your doctor.  If you were given a machine to use  while you sleep, use it only as told by your doctor.  If you are having surgery, make sure to tell your doctor you have sleep apnea. You may need to bring your device with you.  Keep all follow-up visits as told by your doctor. This is important. Contact a doctor if:  The machine that you were given to use during sleep bothers you or does not seem to be working.  You do not get better.  You get worse. Get help right away if:  Your chest hurts.  You have trouble breathing in  enough air.  You have an uncomfortable feeling in your back, arms, or stomach.  You have trouble talking.  One side of your body feels weak.  A part of your face is hanging down. These symptoms may be an emergency. Do not wait to see if the symptoms will go away. Get medical help right away. Call your local emergency services (911 in the [place] Do not drive yourself to the hospital. Summary  This condition affects breathing during sleep.  The most common cause is a collapsed or blocked airway.  The goal of treatment is to help you breathe normally while you sleep. This information is not intended to replace advice given to you by your health care provider. Make sure you discuss any questions you have with your health care provider. Document Released: 02/10/2008 Document Revised: 02/17/2018 Document Reviewed: 12/27/2017 Elsevier Patient Education  2020 [employer].

## 2019-03-28 ENCOUNTER — Other Ambulatory Visit: Payer: Self-pay

## 2019-03-28 ENCOUNTER — Encounter: Payer: Self-pay | Admitting: Family Medicine

## 2019-03-28 ENCOUNTER — Ambulatory Visit: Payer: BC Managed Care – PPO | Admitting: Family Medicine

## 2019-03-28 VITALS — BP 118/75 | HR 80 | Ht 61.5 in | Wt 196.2 lb

## 2019-03-28 DIAGNOSIS — Z9989 Dependence on other enabling machines and devices: Secondary | ICD-10-CM | POA: Diagnosis not present

## 2019-03-28 DIAGNOSIS — G4761 Periodic limb movement disorder: Secondary | ICD-10-CM

## 2019-03-28 DIAGNOSIS — G4733 Obstructive sleep apnea (adult) (pediatric): Secondary | ICD-10-CM | POA: Diagnosis not present

## 2019-04-02 ENCOUNTER — Other Ambulatory Visit: Payer: Self-pay | Admitting: Physician Assistant

## 2019-04-16 ENCOUNTER — Other Ambulatory Visit: Payer: Self-pay | Admitting: Adult Health Nurse Practitioner

## 2019-04-16 DIAGNOSIS — Z79899 Other long term (current) drug therapy: Secondary | ICD-10-CM

## 2019-04-16 DIAGNOSIS — F3341 Major depressive disorder, recurrent, in partial remission: Secondary | ICD-10-CM

## 2019-04-16 DIAGNOSIS — F419 Anxiety disorder, unspecified: Secondary | ICD-10-CM

## 2019-04-17 ENCOUNTER — Ambulatory Visit: Payer: Self-pay | Admitting: General Surgery

## 2019-05-17 ENCOUNTER — Encounter (HOSPITAL_BASED_OUTPATIENT_CLINIC_OR_DEPARTMENT_OTHER): Payer: Self-pay | Admitting: General Surgery

## 2019-05-19 ENCOUNTER — Other Ambulatory Visit (HOSPITAL_COMMUNITY)
Admission: RE | Admit: 2019-05-19 | Discharge: 2019-05-19 | Disposition: A | Discharge status: Home / Self Care | Payer: BC Managed Care – PPO | Source: Ambulatory Visit | Attending: General Surgery | Admitting: General Surgery

## 2019-05-19 DIAGNOSIS — Z20822 Contact with and (suspected) exposure to covid-19: Secondary | ICD-10-CM | POA: Diagnosis not present

## 2019-05-19 DIAGNOSIS — Z01812 Encounter for preprocedural laboratory examination: Secondary | ICD-10-CM | POA: Insufficient documentation

## 2019-05-20 LAB — NOVEL CORONAVIRUS, NAA (HOSP ORDER, SEND-OUT TO REF LAB; TAT 18-24 HRS): SARS-CoV-2, NAA: NOT DETECTED

## 2019-05-22 ENCOUNTER — Other Ambulatory Visit: Payer: Self-pay

## 2019-05-22 ENCOUNTER — Encounter (HOSPITAL_BASED_OUTPATIENT_CLINIC_OR_DEPARTMENT_OTHER): Payer: Self-pay | Admitting: General Surgery

## 2019-05-22 NOTE — Progress Notes (Addendum)
Spoke w/ via phone for pre-op interview--- PT Lab needs dos----  Istat 8             Lab results------ current ekg in chart/ epic COVID test ------ 05-19-2019 Arrive at ------- 0800 NPO after ------ [place] Medications to take morning of surgery ----- Plaquenil, Lexapro, Wellbutrin w/ sips of water Diabetic medication ----- n/a  Patient Special Instructions ----- asked to bring cpap mask/ tubing dos  Pre-Op special Istructions -----  MDA to assess pt dos since just getting pt hx done due to multiple attempts via phone to get pt , her voicemail box has been.  Patient verbalized understanding of instructions that were given at this phone interview. Patient denies shortness of breath, chest pain, fever, cough a this phone interview.   Anesthesia Review: hx SLE, hx RCC s/p left nephrectomy, hx cervical ca s/p hysterectomy, hx post op PE 05/ 2019 (completed eliquis), uses cpap every night  PCP: Dr [name] (lov 02-04-2019 epic) Cardiologist : no Rheumatologist: Dr Amil Amen (lov 03-19-2019 epic) Chest x-ray : 03-14-2019 epic EKG : 02-14-2019 epic Echo : TEE 06-26-2003 epic Stress test:  no Cardiac Cath :  no Sleep Study/ CPAP :  YES/ YES Fasting Blood Sugar :      / Checks Blood Sugar -- times a day:    Pre-diabetic Blood Thinner/ Instructions Maryjane Hurter Dose: NO ASA / Instructions/ Last Dose :  NO

## 2019-05-22 NOTE — Anesthesia Preprocedure Evaluation (Addendum)
Anesthesia Evaluation  Patient identified by MRN, date of birth, ID band Patient awake    Reviewed: Allergy & Precautions, NPO status , Patient's Chart, lab work & pertinent test results  Airway Mallampati: II  TM Distance: >3 FB Neck ROM: Full    Dental  (+) Upper Dentures, Poor Dentition   Pulmonary sleep apnea and Continuous Positive Airway Pressure Ventilation ,    Pulmonary exam normal breath sounds clear to auscultation       Cardiovascular hypertension, + DVT  Normal cardiovascular exam Rhythm:Regular Rate:Normal     Neuro/Psych Depression negative neurological ROS     GI/Hepatic negative GI ROS, Neg liver ROS,   Endo/Other  negative endocrine ROS  Renal/GU Renal  cell ca     Musculoskeletal Hx of SLE   Abdominal (+) + obese,   Peds  Hematology negative hematology ROS (+)   Anesthesia Other Findings   Reproductive/Obstetrics                           Anesthesia Physical Anesthesia Plan  ASA: III  Anesthesia Plan: General   Post-op Pain Management:    Induction: Intravenous  PONV Risk Score and Plan: 4 or greater and Treatment may vary due to age or medical condition, Ondansetron and Dexamethasone  Airway Management Planned: Oral ETT  Additional Equipment: None  Intra-op Plan:   Post-operative Plan: Extubation in OR  Informed Consent: I have reviewed the patients History and Physical, chart, labs and discussed the procedure including the risks, benefits and alternatives for the proposed anesthesia with the patient or authorized representative who has indicated his/her understanding and acceptance.     Dental advisory given  Plan Discussed with: CRNA  Anesthesia Plan Comments:        Anesthesia Quick Evaluation

## 2019-05-23 ENCOUNTER — Ambulatory Visit: Payer: BC Managed Care – PPO | Admitting: Physician Assistant

## 2019-05-23 ENCOUNTER — Ambulatory Visit (HOSPITAL_BASED_OUTPATIENT_CLINIC_OR_DEPARTMENT_OTHER): Payer: BC Managed Care – PPO | Admitting: Anesthesiology

## 2019-05-23 ENCOUNTER — Other Ambulatory Visit: Payer: Self-pay

## 2019-05-23 ENCOUNTER — Ambulatory Visit (HOSPITAL_BASED_OUTPATIENT_CLINIC_OR_DEPARTMENT_OTHER)
Admission: RE | Admit: 2019-05-23 | Discharge: 2019-05-23 | Disposition: A | Discharge status: Home / Self Care | Payer: BC Managed Care – PPO | Attending: General Surgery | Admitting: General Surgery

## 2019-05-23 ENCOUNTER — Encounter (HOSPITAL_BASED_OUTPATIENT_CLINIC_OR_DEPARTMENT_OTHER): Payer: Self-pay | Admitting: General Surgery

## 2019-05-23 ENCOUNTER — Encounter (HOSPITAL_BASED_OUTPATIENT_CLINIC_OR_DEPARTMENT_OTHER)
Admission: RE | Disposition: A | Discharge status: Home / Self Care | Payer: Self-pay | Source: Home / Self Care | Attending: General Surgery

## 2019-05-23 DIAGNOSIS — Z85528 Personal history of other malignant neoplasm of kidney: Secondary | ICD-10-CM | POA: Insufficient documentation

## 2019-05-23 DIAGNOSIS — M1990 Unspecified osteoarthritis, unspecified site: Secondary | ICD-10-CM | POA: Diagnosis not present

## 2019-05-23 DIAGNOSIS — K432 Incisional hernia without obstruction or gangrene: Secondary | ICD-10-CM | POA: Insufficient documentation

## 2019-05-23 DIAGNOSIS — Z79899 Other long term (current) drug therapy: Secondary | ICD-10-CM | POA: Diagnosis not present

## 2019-05-23 DIAGNOSIS — E559 Vitamin D deficiency, unspecified: Secondary | ICD-10-CM | POA: Insufficient documentation

## 2019-05-23 DIAGNOSIS — Z9889 Other specified postprocedural states: Secondary | ICD-10-CM | POA: Insufficient documentation

## 2019-05-23 DIAGNOSIS — F329 Major depressive disorder, single episode, unspecified: Secondary | ICD-10-CM | POA: Diagnosis not present

## 2019-05-23 DIAGNOSIS — E669 Obesity, unspecified: Secondary | ICD-10-CM | POA: Insufficient documentation

## 2019-05-23 DIAGNOSIS — Z8249 Family history of ischemic heart disease and other diseases of the circulatory system: Secondary | ICD-10-CM | POA: Diagnosis not present

## 2019-05-23 DIAGNOSIS — R7303 Prediabetes: Secondary | ICD-10-CM | POA: Diagnosis not present

## 2019-05-23 DIAGNOSIS — G4733 Obstructive sleep apnea (adult) (pediatric): Secondary | ICD-10-CM | POA: Insufficient documentation

## 2019-05-23 DIAGNOSIS — Z8541 Personal history of malignant neoplasm of cervix uteri: Secondary | ICD-10-CM | POA: Diagnosis not present

## 2019-05-23 DIAGNOSIS — M3214 Glomerular disease in systemic lupus erythematosus: Secondary | ICD-10-CM | POA: Insufficient documentation

## 2019-05-23 DIAGNOSIS — F419 Anxiety disorder, unspecified: Secondary | ICD-10-CM | POA: Insufficient documentation

## 2019-05-23 DIAGNOSIS — Z86711 Personal history of pulmonary embolism: Secondary | ICD-10-CM | POA: Insufficient documentation

## 2019-05-23 DIAGNOSIS — Z905 Acquired absence of kidney: Secondary | ICD-10-CM | POA: Diagnosis not present

## 2019-05-23 DIAGNOSIS — Z881 Allergy status to other antibiotic agents status: Secondary | ICD-10-CM | POA: Insufficient documentation

## 2019-05-23 DIAGNOSIS — Z8349 Family history of other endocrine, nutritional and metabolic diseases: Secondary | ICD-10-CM | POA: Insufficient documentation

## 2019-05-23 DIAGNOSIS — Z888 Allergy status to other drugs, medicaments and biological substances status: Secondary | ICD-10-CM | POA: Diagnosis not present

## 2019-05-23 DIAGNOSIS — N183 Chronic kidney disease, stage 3 unspecified: Secondary | ICD-10-CM | POA: Insufficient documentation

## 2019-05-23 DIAGNOSIS — G2581 Restless legs syndrome: Secondary | ICD-10-CM | POA: Insufficient documentation

## 2019-05-23 DIAGNOSIS — Z6836 Body mass index (BMI) 36.0-36.9, adult: Secondary | ICD-10-CM | POA: Insufficient documentation

## 2019-05-23 DIAGNOSIS — I129 Hypertensive chronic kidney disease with stage 1 through stage 4 chronic kidney disease, or unspecified chronic kidney disease: Secondary | ICD-10-CM | POA: Diagnosis not present

## 2019-05-23 DIAGNOSIS — Z8614 Personal history of Methicillin resistant Staphylococcus aureus infection: Secondary | ICD-10-CM | POA: Diagnosis not present

## 2019-05-23 HISTORY — DX: Incisional hernia without obstruction or gangrene: K43.2

## 2019-05-23 HISTORY — DX: Chronic kidney disease, stage 3 unspecified: N18.30

## 2019-05-23 HISTORY — DX: Personal history of other malignant neoplasm of kidney: Z85.528

## 2019-05-23 HISTORY — DX: Personal history of other specified conditions: Z87.898

## 2019-05-23 HISTORY — DX: Reserved for concepts with insufficient information to code with codable children: IMO0002

## 2019-05-23 HISTORY — DX: Obstructive sleep apnea (adult) (pediatric): G47.33

## 2019-05-23 HISTORY — DX: Unspecified osteoarthritis, unspecified site: M19.90

## 2019-05-23 HISTORY — DX: Systemic lupus erythematosus, unspecified: M32.9

## 2019-05-23 HISTORY — DX: Personal history of pulmonary embolism: Z86.711

## 2019-05-23 HISTORY — PX: INCISIONAL HERNIA REPAIR: SHX193

## 2019-05-23 HISTORY — DX: Stress incontinence (female) (male): N39.3

## 2019-05-23 HISTORY — DX: Presence of spectacles and contact lenses: Z97.3

## 2019-05-23 HISTORY — DX: Personal history of malignant neoplasm of cervix uteri: Z85.41

## 2019-05-23 HISTORY — DX: Major depressive disorder, single episode, unspecified: F32.9

## 2019-05-23 HISTORY — DX: Rectocele: N81.6

## 2019-05-23 HISTORY — DX: Personal history of Methicillin resistant Staphylococcus aureus infection: Z86.14

## 2019-05-23 LAB — POCT I-STAT, CHEM 8
BUN: 13 mg/dL (ref 8–23)
Calcium, Ion: 1.28 mmol/L (ref 1.15–1.40)
Chloride: 107 mmol/L (ref 98–111)
Creatinine, Ser: 1.2 mg/dL — ABNORMAL HIGH (ref 0.44–1.00)
Glucose, Bld: 93 mg/dL (ref 70–99)
HCT: 45 % (ref 36.0–46.0)
Hemoglobin: 15.3 g/dL — ABNORMAL HIGH (ref 12.0–15.0)
Potassium: 4.1 mmol/L (ref 3.5–5.1)
Sodium: 143 mmol/L (ref 135–145)
TCO2: 26 mmol/L (ref 22–32)

## 2019-05-23 SURGERY — REPAIR, HERNIA, INCISIONAL, LAPAROSCOPIC
Anesthesia: General | Site: Abdomen

## 2019-05-23 MED ORDER — LIDOCAINE 2% (20 MG/ML) 5 ML SYRINGE
INTRAMUSCULAR | Status: DC | PRN
  Administered 2019-05-23: 100 mg via INTRAVENOUS

## 2019-05-23 MED ORDER — MIDAZOLAM HCL 2 MG/2ML IJ SOLN
INTRAMUSCULAR | Status: DC | PRN
  Administered 2019-05-23: 2 mg via INTRAVENOUS

## 2019-05-23 MED ORDER — FENTANYL CITRATE (PF) 100 MCG/2ML IJ SOLN
INTRAMUSCULAR | Status: DC | PRN
  Administered 2019-05-23: 100 ug via INTRAVENOUS
  Administered 2019-05-23: 50 ug via INTRAVENOUS

## 2019-05-23 MED ORDER — GABAPENTIN 300 MG PO CAPS
ORAL_CAPSULE | ORAL | Status: AC
  Filled 2019-05-23: qty 1

## 2019-05-23 MED ORDER — ROCURONIUM BROMIDE 10 MG/ML (PF) SYRINGE
PREFILLED_SYRINGE | INTRAVENOUS | Status: DC | PRN
  Administered 2019-05-23: 50 mg via INTRAVENOUS

## 2019-05-23 MED ORDER — MIDAZOLAM HCL 2 MG/2ML IJ SOLN
INTRAMUSCULAR | Status: AC
  Filled 2019-05-23: qty 2

## 2019-05-23 MED ORDER — CEFAZOLIN SODIUM-DEXTROSE 2-4 GM/100ML-% IV SOLN
2.0000 g | INTRAVENOUS | Status: AC
  Administered 2019-05-23: 2 g via INTRAVENOUS
  Filled 2019-05-23: qty 100

## 2019-05-23 MED ORDER — GABAPENTIN 300 MG PO CAPS
300.0000 mg | ORAL_CAPSULE | ORAL | Status: AC
  Administered 2019-05-23: 300 mg via ORAL
  Filled 2019-05-23: qty 1

## 2019-05-23 MED ORDER — ACETAMINOPHEN 500 MG PO TABS
1000.0000 mg | ORAL_TABLET | ORAL | Status: AC
  Administered 2019-05-23: 1000 mg via ORAL
  Filled 2019-05-23: qty 2

## 2019-05-23 MED ORDER — EPHEDRINE SULFATE 50 MG/ML IJ SOLN
INTRAMUSCULAR | Status: DC | PRN
  Administered 2019-05-23: 15 mg via INTRAVENOUS

## 2019-05-23 MED ORDER — CHLORHEXIDINE GLUCONATE CLOTH 2 % EX PADS
6.0000 | MEDICATED_PAD | Freq: Once | CUTANEOUS | Status: DC
  Filled 2019-05-23: qty 6

## 2019-05-23 MED ORDER — FENTANYL CITRATE (PF) 250 MCG/5ML IJ SOLN
INTRAMUSCULAR | Status: AC
  Filled 2019-05-23: qty 5

## 2019-05-23 MED ORDER — PROPOFOL 10 MG/ML IV BOLUS
INTRAVENOUS | Status: DC | PRN
  Administered 2019-05-23: 100 mg via INTRAVENOUS

## 2019-05-23 MED ORDER — KETOROLAC TROMETHAMINE 15 MG/ML IJ SOLN
INTRAMUSCULAR | Status: DC | PRN
  Administered 2019-05-23: 15 mg via INTRAVENOUS

## 2019-05-23 MED ORDER — SODIUM CHLORIDE 0.9 % IV SOLN
INTRAVENOUS | Status: DC
  Filled 2019-05-23: qty 1000

## 2019-05-23 MED ORDER — ROCURONIUM BROMIDE 10 MG/ML (PF) SYRINGE
PREFILLED_SYRINGE | INTRAVENOUS | Status: AC
  Filled 2019-05-23: qty 10

## 2019-05-23 MED ORDER — DEXAMETHASONE SODIUM PHOSPHATE 10 MG/ML IJ SOLN
INTRAMUSCULAR | Status: DC | PRN
  Administered 2019-05-23: 5 mg via INTRAVENOUS

## 2019-05-23 MED ORDER — DEXAMETHASONE SODIUM PHOSPHATE 10 MG/ML IJ SOLN
INTRAMUSCULAR | Status: AC
  Filled 2019-05-23: qty 1

## 2019-05-23 MED ORDER — KETOROLAC TROMETHAMINE 30 MG/ML IJ SOLN
30.0000 mg | Freq: Once | INTRAMUSCULAR | Status: DC | PRN
  Filled 2019-05-23: qty 1

## 2019-05-23 MED ORDER — OXYCODONE HCL 5 MG PO TABS: 5.0000 mg | ORAL_TABLET | Freq: Four times a day (QID) | ORAL | 0 refills | Status: DC | PRN

## 2019-05-23 MED ORDER — LIDOCAINE 2% (20 MG/ML) 5 ML SYRINGE
INTRAMUSCULAR | Status: AC
  Filled 2019-05-23: qty 5

## 2019-05-23 MED ORDER — OXYCODONE HCL 5 MG/5ML PO SOLN
5.0000 mg | Freq: Once | ORAL | Status: DC | PRN
  Filled 2019-05-23: qty 5

## 2019-05-23 MED ORDER — BUPIVACAINE LIPOSOME 1.3 % IJ SUSP
INTRAMUSCULAR | Status: DC | PRN
  Administered 2019-05-23: 50 mL

## 2019-05-23 MED ORDER — HYDROMORPHONE HCL 1 MG/ML IJ SOLN
0.2500 mg | INTRAMUSCULAR | Status: DC | PRN
  Filled 2019-05-23: qty 0.5

## 2019-05-23 MED ORDER — ONDANSETRON HCL 4 MG/2ML IJ SOLN
INTRAMUSCULAR | Status: DC | PRN
  Administered 2019-05-23: 4 mg via INTRAVENOUS

## 2019-05-23 MED ORDER — ONDANSETRON HCL 4 MG/2ML IJ SOLN
INTRAMUSCULAR | Status: AC
  Filled 2019-05-23: qty 2

## 2019-05-23 MED ORDER — KETOROLAC TROMETHAMINE 15 MG/ML IJ SOLN
15.0000 mg | INTRAMUSCULAR | Status: DC
  Filled 2019-05-23: qty 1

## 2019-05-23 MED ORDER — OXYCODONE HCL 5 MG PO TABS
5.0000 mg | ORAL_TABLET | Freq: Once | ORAL | Status: DC | PRN
  Filled 2019-05-23: qty 1

## 2019-05-23 MED ORDER — ONDANSETRON HCL 4 MG/2ML IJ SOLN
4.0000 mg | Freq: Once | INTRAMUSCULAR | Status: DC | PRN
  Filled 2019-05-23: qty 2

## 2019-05-23 MED ORDER — ACETAMINOPHEN 500 MG PO TABS
ORAL_TABLET | ORAL | Status: AC
  Filled 2019-05-23: qty 2

## 2019-05-23 MED ORDER — PROPOFOL 10 MG/ML IV BOLUS
INTRAVENOUS | Status: AC
  Filled 2019-05-23: qty 20

## 2019-05-23 MED ORDER — CEFAZOLIN SODIUM-DEXTROSE 2-4 GM/100ML-% IV SOLN
INTRAVENOUS | Status: AC
  Filled 2019-05-23: qty 100

## 2019-05-23 MED ORDER — GABAPENTIN (ONCE-DAILY) 300 MG PO TABS: 300.0000 mg | ORAL_TABLET | Freq: Two times a day (BID) | ORAL | 0 refills | Status: DC

## 2019-05-23 MED ORDER — SUGAMMADEX SODIUM 200 MG/2ML IV SOLN
INTRAVENOUS | Status: DC | PRN
  Administered 2019-05-23: 200 mg via INTRAVENOUS

## 2019-05-23 SURGICAL SUPPLY — 37 items
ADH SKN CLS APL DERMABOND .7 (GAUZE/BANDAGES/DRESSINGS) ×1
APL PRP STRL LF DISP 70% ISPRP (MISCELLANEOUS) ×1
APPLIER CLIP 5 13 M/L LIGAMAX5 (MISCELLANEOUS)
APR CLP MED LRG 5 ANG JAW (MISCELLANEOUS)
BINDER ABDOMINAL 12 ML 46-62 (SOFTGOODS) IMPLANT
BLADE CLIPPER SENSICLIP SURGIC (BLADE) IMPLANT
CABLE HIGH FREQUENCY MONO STRZ (ELECTRODE) ×2 IMPLANT
CHLORAPREP W/TINT 26 (MISCELLANEOUS) ×2 IMPLANT
CLIP APPLIE 5 13 M/L LIGAMAX5 (MISCELLANEOUS) IMPLANT
COVER WAND RF STERILE (DRAPES) ×2 IMPLANT
DERMABOND ADVANCED (GAUZE/BANDAGES/DRESSINGS) ×1
DERMABOND ADVANCED .7 DNX12 (GAUZE/BANDAGES/DRESSINGS) ×1 IMPLANT
DEVICE SECURE STRAP 25 ABSORB (INSTRUMENTS) ×1 IMPLANT
DRAIN CHANNEL 19F RND (DRAIN) IMPLANT
ELECT REM PT RETURN 9FT ADLT (ELECTROSURGICAL) ×2
ELECTRODE REM PT RTRN 9FT ADLT (ELECTROSURGICAL) ×1 IMPLANT
EVACUATOR SILICONE 100CC (DRAIN) IMPLANT
GLOVE BIOGEL PI IND STRL 7.0 (GLOVE) ×1 IMPLANT
GLOVE BIOGEL PI INDICATOR 7.0 (GLOVE) ×1
GLOVE SURG SS PI 7.0 STRL IVOR (GLOVE) ×2 IMPLANT
GOWN STRL REUS W/TWL LRG LVL3 (GOWN DISPOSABLE) ×2 IMPLANT
GRASPER SUT TROCAR 14GX15 (MISCELLANEOUS) ×2 IMPLANT
IRRIG SUCT STRYKERFLOW 2 WTIP (MISCELLANEOUS) ×2
IRRIGATION SUCT STRKRFLW 2 WTP (MISCELLANEOUS) IMPLANT
MESH VENTRALIGHT ST 4.5IN (Mesh General) ×1 IMPLANT
PACK BASIN DAY SURGERY FS (CUSTOM PROCEDURE TRAY) ×2 IMPLANT
SCISSORS LAP 5X35 DISP (ENDOMECHANICALS) ×2 IMPLANT
SET TUBE SMOKE EVAC HIGH FLOW (TUBING) ×3 IMPLANT
SUT ETHILON 2 0 PS N (SUTURE) IMPLANT
SUT MNCRL AB 4-0 PS2 18 (SUTURE) ×2 IMPLANT
SUT NOVA NAB GS-21 0 18 T12 DT (SUTURE) ×4 IMPLANT
SUT PDS AB 0 CT 36 (SUTURE) ×2 IMPLANT
SUT VICRYL 0 UR6 27IN ABS (SUTURE) IMPLANT
TOWEL OR 17X26 10 PK STRL BLUE (TOWEL DISPOSABLE) ×4 IMPLANT
TRAY LAPAROSCOPIC (CUSTOM PROCEDURE TRAY) ×2 IMPLANT
TROCAR BLADELESS OPT 12M 100M (ENDOMECHANICALS) ×2 IMPLANT
TROCAR BLADELESS OPT 5 100 (ENDOMECHANICALS) ×4 IMPLANT

## 2019-05-23 NOTE — Anesthesia Postprocedure Evaluation (Signed)
Anesthesia Post Note  Patient: Beth [name]  Procedure(s) Performed: LAPAROSCOPIC INCISIONAL HERNIA REPAIR WITH MESH (N/A Abdomen)     Patient location during evaluation: PACU Anesthesia Type: General Level of consciousness: awake and alert Pain management: pain level controlled Vital Signs Assessment: post-procedure vital signs reviewed and stable Respiratory status: spontaneous breathing, nonlabored ventilation, respiratory function stable and patient connected to nasal cannula oxygen Cardiovascular status: blood pressure returned to baseline and stable Postop Assessment: no apparent nausea or vomiting Anesthetic complications: no    Last Vitals:  Vitals:   05/23/19 1215 05/23/19 1300  BP: 138/75 139/75  Pulse: 74 81  Resp: 15 16  Temp:  36.7 C  SpO2: 92% 97%    Last Pain:  Vitals:   05/23/19 1300  TempSrc:   PainSc: 0-No pain                 [name]

## 2019-05-23 NOTE — H&P (Signed)
Beth [name] is an 64 y.o. female.   Chief Complaint: hernia HPI: 64 yo female with multiple abdominal surgeries (last was nephrectomy) presents with symptomatic hernia.  Past Medical History:  Diagnosis Date  . Anxiety   . Chronic constipation    followed by dr Carlean Purl  . CKD (chronic kidney disease), stage III   . Enterocele   . History of cervical cancer    hx squamous cell cervical carcinoma,   s/p  radical hysterectomy 11-24-1999  . History of MRSA infection    2005--- buttock abscess  . History of pulmonary embolus (PE)    post op 10-09-2017 (surgery on 09-19-2017)  lower range pulmonary artery,  resolved and completed 6 month Eliquis  . History of renal cell carcinoma    s/p  left nephrectomy 09-19-2017  (oncologist -- dr Alen Blew / urologsit-- dr bell)  . 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 Incisional hernia   . MDD (major depressive disorder)   . OA (osteoarthritis)   . OSA on CPAP    followed by dr dohmeier  . Pre-diabetes   . Rectocele   . RLS (restless legs syndrome) 01/24/2015   Per sleep study 05/2014   . Solitary right kidney    acquired 09-19-2017 s/p left nephrectomy  . SUI (stress urinary incontinence, female)    05-21-2019  improved with therapy  . Systemic lupus erythematosus ([place])    rheumotologist--- dr Amil Amen (lov 03-19-2019 in epic)  . Vitamin D deficiency   . Wears glasses     Past Surgical History:  Procedure Laterality Date  . CESAREAN SECTION  x3   last one 1980  . COLD KNIFE CERVICAL CONIZATION   10/07/1999  @[facility]   . COLONOSCOPY     negative per pt, Dr. Alice Reichert - [facility]  . EXPLORATORY LAPAROTOMY MODIFIED RADICAL HYSTERECTOMY WITH PELVIC LYMPHADENECTOMY AND RESECTION MECKEL'S DIVERTICULUM  11-24-1999 dr Aldean Ast  @[facility]   . LAPAROSCOPIC NEPHRECTOMY, HAND ASSISTED Left 09/19/2017   Procedure: LEFT HAND ASSISTED LAPAROSCOPIC NEPHRECTOMY;  Surgeon: Lucas Mallow, MD;  Location: [facility];  Service: Urology;  Laterality: Left;    Family History  Problem Relation Age of Onset  . Hyperlipidemia Mother   . Hypertension Mother   . Heart disease Mother    Social History:  reports that she has never smoked. She has never used smokeless tobacco. She reports that she does not drink alcohol or use drugs.  Allergies:  Allergies  Allergen Reactions  . Bactrim [Sulfamethoxazole-Trimethoprim]     GI upset  . Cymbalta [Duloxetine Hcl]     Sweating  . Acyclovir And Related Itching    Medications Prior to Admission  Medication Sig Dispense Refill  . acetaminophen (TYLENOL) 500 MG tablet Take 1,000 mg by mouth every 6 (six) hours as needed for mild pain.    Marland Kitchen ALPRAZolam (XANAX) 0.5 MG tablet TAKE 1/2 TO 1 TABLET BY MOUTH 2 X PER DAY, SHOULD LAST LONGER THAN A MONTH, WILL DISCUSS NEXT OV (Patient taking differently: Take 0.5 mg by mouth as needed. TAKE 1/2 TO 1 TABLET BY MOUTH 2 x per day, SHOULD LAST LONGER THAN A MONTH, WILL DISCUSS NEXT OV) 60 tablet 0  . buPROPion (WELLBUTRIN XL) 150 MG 24 hr tablet TAKE 1 TABLET EVERY MORNING FOR MOOD. (Patient taking differently: Take 150 mg  by mouth daily. Take 1 tablet every morning for mood.) 90 tablet 1  . Cholecalciferol (VITAMIN D) 2000 UNITS CAPS Take 1 capsule by mouth daily.    . Cyanocobalamin (B-12 PO) Take by mouth daily.    Marland Kitchen escitalopram (LEXAPRO) 20 MG tablet TAKE ONE AND A HALF TABLETS (30MG ) DAILY FOR MOOD & ANXIETY. (Patient taking differently: Take 30 mg by mouth daily. TAKE ONE AND A HALF TABLETS (30MG ) DAILY FOR MOOD & ANXIETY.) 135 tablet 2  . hydroxychloroquine (PLAQUENIL) 200 MG tablet Take 1 tablet 2 x /day for Arthritis (Patient taking differently: Take 400 mg by mouth daily. Take 1 tablet 2 x /day for Arthritis) 180 tablet 1  . linaclotide (LINZESS) 290 MCG CAPS capsule Take 290 mcg by mouth daily as needed  (constipation).     . Magnesium 500 MG TABS Take 500 mg by mouth daily.     . Probiotic Product (PROBIOTIC DAILY PO) Take by mouth daily.      No results found for this or any previous visit (from the past 48 hour(s)). No results found.  Review of Systems  Constitutional: Negative for chills and fever.  HENT: Negative for hearing loss.   Respiratory: Negative for cough.   Cardiovascular: Negative for chest pain and palpitations.  Gastrointestinal: Negative for abdominal pain, nausea and vomiting.  Genitourinary: Negative for dysuria and urgency.  Musculoskeletal: Negative for myalgias and neck pain.  Skin: Negative for rash.  Neurological: Negative for dizziness and headaches.  Hematological: Does not bruise/bleed easily.  Psychiatric/Behavioral: Negative for suicidal ideas.    Blood pressure (!) 151/72, pulse 75, temperature 98.1 F (36.7 C), temperature source Oral, resp. rate 16, height 5\' 1"  (1.549 m), weight 88 kg, SpO2 100 %. Physical Exam  Nursing note and vitals reviewed. Constitutional: She is oriented to person, place, and time. She appears well-developed and well-nourished.  HENT:  Head: Normocephalic and atraumatic.  Eyes: Conjunctivae and EOM are normal. No scleral icterus.  Cardiovascular: Normal rate and regular rhythm.  Respiratory: Effort normal and breath sounds normal. She has no wheezes. She has no rales. She exhibits no tenderness.  GI: Soft. She exhibits no distension. There is no abdominal tenderness. There is no rebound.  Incisional hernia  Musculoskeletal:        General: No edema. Normal range of motion.     Cervical back: Normal range of motion and neck supple.  Neurological: She is alert and oriented to person, place, and time.  Skin: Skin is warm and dry.  Psychiatric: She has a normal mood and affect. Her behavior is normal.     Assessment/Plan 64 yo female with incisional hernia -lap incisional hernia repair with mesh as outpatient -[facility]  protocol -outpatient procedure  Mickeal Skinner, MD 05/23/2019, 8:22 AM

## 2019-05-23 NOTE — Transfer of Care (Signed)
Immediate Anesthesia Transfer of Care Note  Patient: Beth [name]  Procedure(s) Performed: LAPAROSCOPIC INCISIONAL HERNIA REPAIR WITH MESH (N/A Abdomen)  Patient Location: PACU  Anesthesia Type:General  Level of Consciousness: awake, alert , oriented and patient cooperative  Airway & Oxygen Therapy: Patient Spontanous Breathing and Patient connected to nasal cannula oxygen  Post-op Assessment: Report given to RN and Post -op Vital signs reviewed and stable  Post vital signs: Reviewed and stable  Last Vitals:  Vitals Value Taken Time  BP    Temp    Pulse 81 05/23/19 1115  Resp 10 05/23/19 1115  SpO2 96 % 05/23/19 1115  Vitals shown include unvalidated device data.  Last Pain:  Vitals:   05/23/19 0755  TempSrc: Oral         Complications: No apparent anesthesia complications

## 2019-05-23 NOTE — Discharge Instructions (Signed)
   No advil, aleve, motrin, ibuprofen until 445 pm today  Information for Discharge Teaching: EXPAREL (bupivacaine liposome injectable suspension)   Your surgeon gave you EXPAREL(bupivacaine) in your surgical incision to help control your pain after surgery.   EXPAREL is a local anesthetic that provides pain relief by numbing the tissue around the surgical site.  EXPAREL is designed to release pain medication over time and can control pain for up to 72 hours.  Depending on how you respond to EXPAREL, you may require less pain medication during your recovery.  Possible side effects:  Temporary loss of sensation or ability to move in the area where bupivacaine was injected.  Nausea, vomiting, constipation  Rarely, numbness and tingling in your mouth or lips, lightheadedness, or anxiety may occur.  Call your doctor right away if you think you may be experiencing any of these sensations, or if you have other questions regarding possible side effects.  Follow all other discharge instructions given to you by your surgeon or nurse. Eat a healthy diet and drink plenty of water or other fluids.  If you return to the hospital for any reason within 96 hours following the administration of EXPAREL, please inform your health care providers. Post Anesthesia Home Care Instructions  Activity: Get plenty of rest for the remainder of the day. A responsible adult should stay with you for 24 hours following the procedure.  For the next 24 hours, DO NOT: -Drive a car -Paediatric nurse -Drink alcoholic beverages -Take any medication unless instructed by your physician -Make any legal decisions or sign important papers.  Meals: Start with liquid foods such as gelatin or soup. Progress to regular foods as tolerated. Avoid greasy, spicy, heavy foods. If nausea and/or vomiting occur, drink only clear liquids until the nausea and/or vomiting subsides. Call your physician if vomiting  continues.  Special Instructions/Symptoms: Your throat may feel dry or sore from the anesthesia or the breathing tube placed in your throat during surgery. If this causes discomfort, gargle with warm salt water. The discomfort should disappear within 24 hours.  If you had a scopolamine patch placed behind your ear for the management of post- operative nausea and/or vomiting:  1. The medication in the patch is effective for 72 hours, after which it should be removed.  Wrap patch in a tissue and discard in the trash. Wash hands thoroughly with soap and water. 2. You may remove the patch earlier than 72 hours if you experience unpleasant side effects which may include dry mouth, dizziness or visual disturbances. 3. Avoid touching the patch. Wash your hands with soap and water after contact with the patch.

## 2019-05-23 NOTE — Op Note (Signed)
Preoperative diagnosis: incisional hernia without obstruction or gangrene  Postoperative diagnosis: Same   Procedure: laparoscopic incisional hernia repair with placement of underlay mesh  Surgeon: Gurney Maxin, M.D.  Asst: none  Anesthesia: Gen.   Indications for procedure: Beth [name] is a 64 y.o. female with symptoms of abdominal pain and hernia reducible on exam. This was related to a port site from nephrectomy  Description of procedure: The patient was brought into the operative suite, placed supine. Anesthesia was administered with endotracheal tube. Patient was strapped in place and foot board was secured. Both arms were tucked. All pressure points were offloaded by foam padding. The patient was prepped and draped in the usual sterile fashion.  A small incision was made over the left subcostal area and 79mm trocar was placed with optical entry. Pneumoperitoneum was applied with high flow low pressure. The abdominal cavity was inspected and there was a small epigastric hernia with the falciform fat in the sac. There was a slight laxity at the umbilicus as well. 1 25mm trocar was placed in the mid left abdomen and 3 additional 66mm trocars 1 in the LLQ.A left and right TAP block was performed with marcaine/exparel and trocars were placed under direct vision.  Blunt dissection was used to remove most of the filmy adhesions with occasional sharp dissection. Cautery was used to provide hemostasis. The hernia was identified and was filled with fat. It was slowly dissected free and reduced. The defect was about 2 cm in diameter. The defect was closed primarily with 0 PDS. A 11 cm round ventralight mesh was inserted and placed beneath the fascia and peritoneum to the repair the mesh. 4 transfascial 0 prolene sutures were used to secure the mesh in place and absorbable tackers were used to appose the mesh against the abdominal wall in all areas. The umbilicus laxity was reinforced with a 0 PDS via  suture passer.  The abdominal contents were again inspected and hemostasis was intact.  0 vicryl was used to close the fascial defect of the 56mm trocar site using suture passer. Pneumoperitoneum was removed, all trocar were removed. All incisions were closed with 4-0 monocryl subcuticular stitch. The patient woke from anesthesia and was brought to PACU in stable condition.  Findings: small incisional hernia, umbilical laxity  Specimen: none  Blood loss: 10 ml  Local anesthesia: 50 ml Exparel/Marcaine  Complications: none  Implant: 11 cm round ventralight ST mesh  Images:       Gurney Maxin, M.D. General, Bariatric, & Minimally Invasive Surgery [facility]

## 2019-05-23 NOTE — Anesthesia Procedure Notes (Signed)
Procedure Name: Intubation Date/Time: 05/23/2019 10:05 AM Performed by: Wanita Chamberlain, CRNA Pre-anesthesia Checklist: Timeout performed, Patient identified, Emergency Drugs available, Suction available and Patient being monitored Patient Re-evaluated:Patient Re-evaluated prior to induction Preoxygenation: Pre-oxygenation with 100% oxygen Induction Type: IV induction Ventilation: Mask ventilation without difficulty Grade View: Grade I Tube type: Oral Tube size: 7.0 mm Number of attempts: 1 Airway Equipment and Method: Stylet Placement Confirmation: breath sounds checked- equal and bilateral,  CO2 detector,  positive ETCO2 and ETT inserted through vocal cords under direct vision Secured at: 21 cm Tube secured with: Tape Dental Injury: Teeth and Oropharynx as per pre-operative assessment

## 2019-06-09 ENCOUNTER — Other Ambulatory Visit: Payer: Self-pay | Admitting: Physician Assistant

## 2019-07-14 ENCOUNTER — Other Ambulatory Visit: Payer: Self-pay | Admitting: Physician Assistant

## 2019-07-19 ENCOUNTER — Other Ambulatory Visit: Payer: Self-pay | Admitting: Physician Assistant

## 2019-07-26 ENCOUNTER — Ambulatory Visit: Payer: BC Managed Care – PPO | Attending: Family

## 2019-07-26 DIAGNOSIS — Z23 Encounter for immunization: Secondary | ICD-10-CM

## 2019-07-26 NOTE — Progress Notes (Signed)
   Covid-19 Vaccination Clinic  Name:  Beth [name]    MRN: [id number] DOB: 03-Feb-1956  07/26/2019  Beth [name] was observed post Covid-19 immunization for 15 minutes without incident. She was provided with Vaccine Information Sheet and instruction to access the V-Safe system.   Beth [name] was instructed to call 911 with any severe reactions post vaccine: Marland Kitchen Difficulty breathing  . Swelling of face and throat  . A fast heartbeat  . A bad rash all over body  . Dizziness and weakness   Immunizations Administered    Name Date Dose VIS Date Route   Moderna COVID-19 Vaccine 07/26/2019  1:13 PM 0.5 mL 04/17/2019 Intramuscular   Manufacturer: Moderna   Lot: [id number]   [id number]

## 2019-08-28 ENCOUNTER — Ambulatory Visit: Payer: BC Managed Care – PPO | Attending: Family

## 2019-08-28 DIAGNOSIS — Z23 Encounter for immunization: Secondary | ICD-10-CM

## 2019-08-28 NOTE — Progress Notes (Signed)
   Covid-19 Vaccination Clinic  Name:  Beth [name]    MRN: [id number] DOB: 02-29-1956  08/28/2019  [name] was observed post Covid-19 immunization for 15 minutes without incident. She was provided with Vaccine Information Sheet and instruction to access the V-Safe system.   [name] was instructed to call 911 with any severe reactions post vaccine: Marland Kitchen Difficulty breathing  . Swelling of face and throat  . A fast heartbeat  . A bad rash all over body  . Dizziness and weakness   Immunizations Administered    Name Date Dose VIS Date Route   Moderna COVID-19 Vaccine 08/28/2019  1:49 PM 0.5 mL 04/17/2019 Intramuscular   Manufacturer: Moderna   Lot: [id number]   [place]:[id number]

## 2019-09-06 ENCOUNTER — Other Ambulatory Visit: Payer: Self-pay | Admitting: Physician Assistant

## 2019-09-20 ENCOUNTER — Encounter: Payer: Self-pay | Admitting: Neurology

## 2019-09-26 ENCOUNTER — Encounter: Payer: Self-pay | Admitting: Family Medicine

## 2019-09-26 ENCOUNTER — Other Ambulatory Visit: Payer: Self-pay

## 2019-09-26 ENCOUNTER — Ambulatory Visit: Payer: BC Managed Care – PPO | Admitting: Family Medicine

## 2019-09-26 VITALS — BP 140/79 | HR 83 | Temp 97.0°F | Ht 61.0 in | Wt 203.0 lb

## 2019-09-26 DIAGNOSIS — G4761 Periodic limb movement disorder: Secondary | ICD-10-CM | POA: Diagnosis not present

## 2019-09-26 DIAGNOSIS — G4733 Obstructive sleep apnea (adult) (pediatric): Secondary | ICD-10-CM

## 2019-09-26 DIAGNOSIS — Z9989 Dependence on other enabling machines and devices: Secondary | ICD-10-CM

## 2019-09-26 NOTE — Patient Instructions (Signed)
Please continue using your CPAP regularly. While your insurance requires that you use CPAP at least 4 hours each night on 70% of the nights, I recommend, that you not skip any nights and use it throughout the night if you can. Getting used to CPAP and staying with the treatment long term does take time and patience and discipline. Untreated obstructive sleep apnea when it is moderate to severe can have an adverse impact on cardiovascular health and raise her risk for heart disease, arrhythmias, hypertension, congestive heart failure, stroke and diabetes. Untreated obstructive sleep apnea causes sleep disruption, nonrestorative sleep, and sleep deprivation. This can have an impact on your day to day functioning and cause daytime sleepiness and impairment of cognitive function, memory loss, mood disturbance, and problems focussing. Using CPAP regularly can improve these symptoms.   Follow up in 1 year, sooner if needed   Sleep Apnea Sleep apnea affects breathing during sleep. It causes breathing to stop for a short time or to become shallow. It can also increase the risk of:  Heart attack.  Stroke.  Being very overweight (obese).  Diabetes.  Heart failure.  Irregular heartbeat. The goal of treatment is to help you breathe normally again. What are the causes? There are three kinds of sleep apnea:  Obstructive sleep apnea. This is caused by a blocked or collapsed airway.  Central sleep apnea. This happens when the brain does not send the right signals to the muscles that control breathing.  Mixed sleep apnea. This is a combination of obstructive and central sleep apnea. The most common cause of this condition is a collapsed or blocked airway. This can happen if:  Your throat muscles are too relaxed.  Your tongue and tonsils are too large.  You are overweight.  Your airway is too small. What increases the risk?  Being overweight.  Smoking.  Having a small airway.  Being  older.  Being female.  Drinking alcohol.  Taking medicines to calm yourself (sedatives or tranquilizers).  Having family members with the condition. What are the signs or symptoms?  Trouble staying asleep.  Being sleepy or tired during the day.  Getting angry a lot.  Loud snoring.  Headaches in the morning.  Not being able to focus your mind (concentrate).  Forgetting things.  Less interest in sex.  Mood swings.  Personality changes.  Feelings of sadness (depression).  Waking up a lot during the night to pee (urinate).  Dry mouth.  Sore throat. How is this diagnosed?  Your medical history.  A physical exam.  A test that is done when you are sleeping (sleep study). The test is most often done in a sleep lab but may also be done at home. How is this treated?   Sleeping on your side.  Using a medicine to get rid of mucus in your nose (decongestant).  Avoiding the use of alcohol, medicines to help you relax, or certain pain medicines (narcotics).  Losing weight, if needed.  Changing your diet.  Not smoking.  Using a machine to open your airway while you sleep, such as: ? An oral appliance. This is a mouthpiece that shifts your lower jaw forward. ? A CPAP device. This device blows air through a mask when you breathe out (exhale). ? An EPAP device. This has valves that you put in each nostril. ? A BPAP device. This device blows air through a mask when you breathe in (inhale) and breathe out.  Having surgery if other treatments do not   work. It is important to get treatment for sleep apnea. Without treatment, it can lead to:  High blood pressure.  Coronary artery disease.  In men, not being able to have an erection (impotence).  Reduced thinking ability. Follow these instructions at home: Lifestyle  Make changes that your doctor recommends.  Eat a healthy diet.  Lose weight if needed.  Avoid alcohol, medicines to help you relax, and some pain  medicines.  Do not use any products that contain nicotine or tobacco, such as cigarettes, e-cigarettes, and chewing tobacco. If you need help quitting, ask your doctor. General instructions  Take over-the-counter and prescription medicines only as told by your doctor.  If you were given a machine to use while you sleep, use it only as told by your doctor.  If you are having surgery, make sure to tell your doctor you have sleep apnea. You may need to bring your device with you.  Keep all follow-up visits as told by your doctor. This is important. Contact a doctor if:  The machine that you were given to use during sleep bothers you or does not seem to be working.  You do not get better.  You get worse. Get help right away if:  Your chest hurts.  You have trouble breathing in enough air.  You have an uncomfortable feeling in your back, arms, or stomach.  You have trouble talking.  One side of your body feels weak.  A part of your face is hanging down. These symptoms may be an emergency. Do not wait to see if the symptoms will go away. Get medical help right away. Call your local emergency services (911 in the [place] Do not drive yourself to the hospital. Summary  This condition affects breathing during sleep.  The most common cause is a collapsed or blocked airway.  The goal of treatment is to help you breathe normally while you sleep. This information is not intended to replace advice given to you by your health care provider. Make sure you discuss any questions you have with your health care provider. Document Revised: 02/17/2018 Document Reviewed: 12/27/2017 Elsevier Patient Education  2020 [employer]  

## 2019-09-26 NOTE — Progress Notes (Signed)
PATIENT: Beth [name] DOB: 1956-05-03  REASON FOR VISIT: follow up HISTORY FROM: patient  Chief Complaint  Patient presents with  . Follow-up    cpap fu, rm 1 alone, pt states she is doing well on cpap      HISTORY OF PRESENT ILLNESS: Today 09/26/19 Beth [name] is a 64 y.o. female here today for follow up for OSA on CPAP and PLMD. She reports doing very well. She is using CPAP nightly without any difficulty. She feels that it continues to help with improved sleep quality and increased energy levels. She did get a new mask and denies any concerns of leak.   Compliance report dated 08/26/2019 through 09/24/2019 reveals that she is used CPAP every night for compliance of 100%.  She has used CPAP greater than 4 hours every night for compliance of 100%.  Average usage was 8 hours and 7 minutes.  Residual AHI was 0.4 on 5 to 10 cm of water and an EPR of 2.  Leak in the 95th percentile now at 7.3 L/min.  HISTORY: (copied from my note on 03/28/2019)  Beth [name] is a 63 y.o. female here today for follow up for OSA on CPAP and periodic limb movement disorder.  She reports doing very well on CPAP therapy.  She has adjusted nicely.  Her only concern is that her mask may be a little small.  She has noticed a little bit of air leak from the bridge of her nose.  She is using a nasal mask.  Previously she had tried nasal pillow.  She is uncertain of which mask is better for her.  She does note significant improvement in sleep and energy levels.  Compliance report dated 02/25/2019 through 03/26/2019 reveals that she is using CPAP every night for compliance of 100%.  Every night she used CPAP greater than 4 hours for compliance 100%.  Average usage was 7 hours and 55 minutes.  AHI was 0.4 on 5 to 10 cm water and an EPR of 2.  Leak noted in the 95th percentile of 19.8.  HISTORY: (copied from Dr Dohmeier's note on 10/25/2018)  Beth Rogersis a 64 y.o.african - americanfemalepatient, seen  virtuallyin a referral from Dr. Everardo All a  sleep evaluation.  Chief complaint according to patient :" my sleep is not refreshing- as it was when I first started on CPAP" .  Sleep and medical history:Had CPAP for about 5 years now, compliant user. She has a history of left kidney cancer. Lupus, pre diabetes, arthritis on Plaquenil.  PLMs were described I her last sleep study. She had a PE in 10-09-2017 following nephrectomy.Nocturia - Dr Nicki Reaper [place] is treating. She has a rectocele- has PT  Family sleep and medical history:No OSA in immediate family. One sister who just passed 03/2018.  Social history:Patient is part time from home and part from her office. Daytime 8 -5 .  Non-Smoker, Non Drinker, no caffeine use.  3 people live in the household, you and mother and aunt.  Sleep habits are as follows: Dinner time is 7 PM, bed time 9.30-10 before she started phentermine. Now sleeps after 10.30 PM.  Struggling to go to sleep since being on weight loss therapy. Uses a sleep aid, OTC. The bedroom is cool,but the TV is on- for much of the night. Sleep on her sides, left preferred, and on 1 flat pillow. She reports are dreams, not vivid. One nocturia.   REVIEW OF SYSTEMS: Out of a complete 14 system review of  symptoms, none the patient complains only of the following symptoms, none and all other reviewed systems are negative.  ESS: 0  FSS:36  ALLERGIES: Allergies  Allergen Reactions  . Bactrim [Sulfamethoxazole-Trimethoprim]     GI upset  . Cymbalta [Duloxetine Hcl]     Sweating  . Acyclovir And Related Itching    HOME MEDICATIONS: Outpatient Medications Prior to Visit  Medication Sig Dispense Refill  . acetaminophen (TYLENOL) 500 MG tablet Take 1,000 mg by mouth every 6 (six) hours as needed for mild pain.    Marland Kitchen ALPRAZolam (XANAX) 0.5 MG tablet TAKE 1/2 TO 1 TABLET BY MOUTH 2 X PER DAY, SHOULD LAST LONGER THAN A MONTH, WILL DISCUSS NEXT OV 60 tablet 0   . buPROPion (WELLBUTRIN XL) 150 MG 24 hr tablet TAKE 1 TABLET EVERY MORNING FOR MOOD. 90 tablet 0  . Cholecalciferol (VITAMIN D) 2000 UNITS CAPS Take 1 capsule by mouth daily.    Marland Kitchen escitalopram (LEXAPRO) 20 MG tablet TAKE ONE AND A HALF TABLETS (30MG ) DAILY FOR MOOD & ANXIETY. (Patient taking differently: Take 30 mg by mouth daily. TAKE ONE AND A HALF TABLETS (30MG ) DAILY FOR MOOD & ANXIETY.) 135 tablet 2  . hydroxychloroquine (PLAQUENIL) 200 MG tablet Take 1 tablet 2 x /day for Arthritis (Patient taking differently: Take 400 mg by mouth daily. Take 1 tablet 2 x /day for Arthritis) 180 tablet 1  . linaclotide (LINZESS) 290 MCG CAPS capsule Take 290 mcg by mouth daily as needed (constipation).     . Magnesium 500 MG TABS Take 500 mg by mouth daily.     . Probiotic Product (PROBIOTIC DAILY PO) Take by mouth daily.    . Cyanocobalamin (B-12 PO) Take by mouth daily.    . Gabapentin, Once-Daily, 300 MG TABS Take 300 mg by mouth 2 (two) times daily. 20 tablet 0  . oxyCODONE (OXY IR/ROXICODONE) 5 MG immediate release tablet Take 1 tablet (5 mg total) by mouth every 6 (six) hours as needed for severe pain. 20 tablet 0   No facility-administered medications prior to visit.    PAST MEDICAL HISTORY: Past Medical History:  Diagnosis Date  . Anxiety   . Chronic constipation    followed by dr Carlean Purl  . CKD (chronic kidney disease), stage III   . Enterocele   . History of cervical cancer    hx squamous cell cervical carcinoma,   s/p  radical hysterectomy 11-24-1999  . History of MRSA infection    2005--- buttock abscess  . History of pulmonary embolus (PE)    post op 10-09-2017 (surgery on 09-19-2017)  lower range pulmonary artery,  resolved and completed 6 month Eliquis  . History of renal cell carcinoma    s/p  left nephrectomy 09-19-2017  (oncologist -- dr Alen Blew / urologsit-- dr bell)  . 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 Incisional hernia   . MDD (major depressive disorder)   . OA (osteoarthritis)   . OSA on CPAP    followed by dr dohmeier  . Pre-diabetes   . Rectocele   . RLS (restless legs syndrome) 01/24/2015   Per sleep study 05/2014   . Solitary right kidney    acquired 09-19-2017 s/p left nephrectomy  . SUI (stress urinary incontinence, female)    05-21-2019  improved with therapy  .  Systemic lupus erythematosus ([place])    rheumotologist--- dr Amil Amen (lov 03-19-2019 in epic)  . Vitamin D deficiency   . Wears glasses     PAST SURGICAL HISTORY: Past Surgical History:  Procedure Laterality Date  . CESAREAN SECTION  x3   last one 1980  . COLD KNIFE CERVICAL CONIZATION   10/07/1999  @[facility]   . COLONOSCOPY     negative per pt, Dr. Alice Reichert - [facility]  . EXPLORATORY LAPAROTOMY MODIFIED RADICAL HYSTERECTOMY WITH PELVIC LYMPHADENECTOMY AND RESECTION MECKEL'S DIVERTICULUM  11-24-1999 dr Aldean Ast  @[facility]   . INCISIONAL HERNIA REPAIR N/A 05/23/2019   Procedure: LAPAROSCOPIC INCISIONAL HERNIA REPAIR WITH MESH;  Surgeon: Kinsinger, Arta Bruce, MD;  Location: [facility];  Service: General;  Laterality: N/A;  . LAPAROSCOPIC NEPHRECTOMY, HAND ASSISTED Left 09/19/2017   Procedure: LEFT HAND ASSISTED LAPAROSCOPIC NEPHRECTOMY;  Surgeon: Lucas Mallow, MD;  Location: [facility];  Service: Urology;  Laterality: Left;    FAMILY HISTORY: Family History  Problem Relation Age of Onset  . Hyperlipidemia Mother   . Hypertension Mother   . Heart disease Mother     SOCIAL HISTORY: Social History   Socioeconomic History  . Marital status: Single    Spouse name: Not on file  . Number of children: 3  . Years of education: Not on file  . Highest education level: Not on file  Occupational History  . Occupation: Network engineer  Tobacco Use  . Smoking status: Never Smoker  . Smokeless tobacco: Never Used   Substance and Sexual Activity  . Alcohol use: No  . Drug use: Never  . Sexual activity: Not Currently  Other Topics Concern  . Not on file  Social History Narrative   Single - 3 children   Secretary [employer]   Never smoker, No ETOH/drugs   Social Determinants of Health   Financial Resource Strain:   . Difficulty of Paying Living Expenses:   Food Insecurity:   . Worried About Charity fundraiser in the Last Year:   . Arboriculturist in the Last Year:   Transportation Needs:   . Film/video editor (Medical):   Marland Kitchen Lack of Transportation (Non-Medical):   Physical Activity:   . Days of Exercise per Week:   . Minutes of Exercise per Session:   Stress:   . Feeling of Stress :   Social Connections:   . Frequency of Communication with Friends and Family:   . Frequency of Social Gatherings with Friends and Family:   . Attends Religious Services:   . Active Member of Clubs or Organizations:   . Attends Archivist Meetings:   Marland Kitchen Marital Status:   Intimate Partner Violence:   . Fear of Current or Ex-Partner:   . Emotionally Abused:   Marland Kitchen Physically Abused:   . Sexually Abused:       PHYSICAL EXAM  Vitals:   09/26/19 1105  BP: 140/79  Pulse: 83  Temp: (!) 97 F (36.1 C)  Weight: 203 lb (92.1 kg)  Height: 5\' 1"  (1.549 m)   Body mass index is 38.36 kg/m.  Generalized: Well developed, in no acute distress  Cardiology: normal rate and rhythm, no murmur noted Respiratory: clear to auscultation bilaterally  Neurological examination  Mentation: Alert oriented to time, place, history taking. Follows all commands speech and language fluent Cranial nerve II-XII: Pupils were equal round reactive to light. Extraocular movements were full Motor: The motor testing reveals 5 over 5 strength of all  4 extremities. Good symmetric motor tone is noted throughout.  Gait and station: Gait is normal.   DIAGNOSTIC DATA (LABS, IMAGING, TESTING) - I reviewed patient records,  labs, notes, testing and imaging myself where available.  No flowsheet data found.   Lab Results  Component Value Date   WBC 3.3 (L) 02/14/2019   HGB 15.3 (H) 05/23/2019   HCT 45.0 05/23/2019   MCV 91.3 02/14/2019   PLT 269 02/14/2019      Component Value Date/Time   NA 143 05/23/2019 0817   K 4.1 05/23/2019 0817   CL 107 05/23/2019 0817   CO2 26 02/14/2019 1103   GLUCOSE 93 05/23/2019 0817   BUN 13 05/23/2019 0817   CREATININE 1.20 (H) 05/23/2019 0817   CREATININE 1.26 (H) 02/14/2019 1103   CALCIUM 9.6 02/14/2019 1103   PROT 7.5 02/14/2019 1103   ALBUMIN 3.4 (L) 10/09/2017 0947   AST 18 02/14/2019 1103   ALT 15 02/14/2019 1103   ALKPHOS 52 10/09/2017 0947   BILITOT 0.4 02/14/2019 1103   GFRNONAA 45 (L) 02/14/2019 1103   GFRAA 53 (L) 02/14/2019 1103   Lab Results  Component Value Date   CHOL 158 02/14/2019   HDL 35 (L) 02/14/2019   LDLCALC 96 02/14/2019   TRIG 175 (H) 02/14/2019   CHOLHDL 4.5 02/14/2019   Lab Results  Component Value Date   HGBA1C 6.0 (H) 02/14/2019   Lab Results  Component Value Date   VITAMINB12 649 06/27/2018   Lab Results  Component Value Date   TSH 0.74 02/14/2019       ASSESSMENT AND PLAN 64 y.o. year old female  has a past medical history of Anxiety, Chronic constipation, CKD (chronic kidney disease), stage III, Enterocele, History of cervical cancer, History of MRSA infection, History of pulmonary embolus (PE), History of renal cell carcinoma, History of seizure, Incisional hernia, MDD (major depressive disorder), OA (osteoarthritis), OSA on CPAP, Pre-diabetes, Rectocele, RLS (restless legs syndrome) (01/24/2015), Solitary right kidney, SUI (stress urinary incontinence, female), Systemic lupus erythematosus ([place]), Vitamin D deficiency, and Wears glasses. here with     ICD-10-CM   1. OSA on CPAP  G47.33 For home use only DME continuous positive airway pressure (CPAP)   Z99.89   2. Periodic limb movement disorder  G47.61       Beth [name] is doing very well.  Compliance report reveals excellent compliance.  Leak is now acceptable at 7.3 L/min.  Apnea is well managed.  She was encouraged to continue using CPAP nightly and for greater than 4 hours each night.  Healthy lifestyle habits encouraged.  She will follow-up with [place] in 1 year, sooner if needed.  She verbalizes understanding and agreement with this plan.  Orders Placed This Encounter  Procedures  . For home use only DME continuous positive airway pressure (CPAP)    Supplies    Order Specific Question:   Length of Need    Answer:   Lifetime    Order Specific Question:   Patient has OSA or probable OSA    Answer:   Yes    Order Specific Question:   Is the patient currently using CPAP in the home    Answer:   Yes    Order Specific Question:   Settings    Answer:   Other see comments    Order Specific Question:   CPAP supplies needed    Answer:   Mask, headgear, cushions, filters, heated tubing and water chamber     No  orders of the defined types were placed in this encounter.     I spent 15 minutes with the patient. 50% of this time was spent counseling and educating patient on plan of care and medications.    [name], FNP-C 09/26/2019, 11:22 AM [facility] [address], [place], [place] [address] [phone]

## 2019-09-27 NOTE — Progress Notes (Signed)
Order for Cpap supplies sent to [facility] via [employer]. Confirmation received that the order transmitted was successful.

## 2019-10-02 ENCOUNTER — Encounter: Payer: Self-pay | Admitting: Internal Medicine

## 2019-10-07 ENCOUNTER — Other Ambulatory Visit: Payer: Self-pay | Admitting: Physician Assistant

## 2019-10-16 ENCOUNTER — Encounter: Payer: Self-pay | Admitting: Physician Assistant

## 2019-10-16 ENCOUNTER — Ambulatory Visit (INDEPENDENT_AMBULATORY_CARE_PROVIDER_SITE_OTHER): Payer: BC Managed Care – PPO | Admitting: Physician Assistant

## 2019-10-16 ENCOUNTER — Other Ambulatory Visit: Payer: Self-pay

## 2019-10-16 VITALS — BP 132/74 | Temp 97.3°F | Wt 203.0 lb

## 2019-10-16 DIAGNOSIS — E559 Vitamin D deficiency, unspecified: Secondary | ICD-10-CM

## 2019-10-16 DIAGNOSIS — Z79899 Other long term (current) drug therapy: Secondary | ICD-10-CM

## 2019-10-16 DIAGNOSIS — G629 Polyneuropathy, unspecified: Secondary | ICD-10-CM | POA: Diagnosis not present

## 2019-10-16 DIAGNOSIS — E785 Hyperlipidemia, unspecified: Secondary | ICD-10-CM

## 2019-10-16 DIAGNOSIS — R7309 Other abnormal glucose: Secondary | ICD-10-CM

## 2019-10-16 DIAGNOSIS — Z85528 Personal history of other malignant neoplasm of kidney: Secondary | ICD-10-CM

## 2019-10-16 DIAGNOSIS — I10 Essential (primary) hypertension: Secondary | ICD-10-CM | POA: Diagnosis not present

## 2019-10-16 DIAGNOSIS — D649 Anemia, unspecified: Secondary | ICD-10-CM | POA: Diagnosis not present

## 2019-10-16 MED ORDER — TOPIRAMATE 50 MG PO TABS
50.0000 mg | ORAL_TABLET | Freq: Two times a day (BID) | ORAL | 2 refills | Status: DC
Start: 2019-10-16 — End: 2019-11-26

## 2019-10-16 NOTE — Progress Notes (Signed)
Assessment and Plan:  Essential hypertension -     COMPLETE METABOLIC PANEL WITH GFR -     TSH - continue medications, DASH diet, exercise and monitor at home. Call if greater than 130/80.   Hyperlipidemia, unspecified hyperlipidemia type -     Lipid panel check lipids decrease fatty foods increase activity.   Abnormal glucose -     Hemoglobin A1c Discussed general issues about diabetes pathophysiology and management., Educational material distributed., Suggested low cholesterol diet., Encouraged aerobic exercise., Discussed foot care., Reminded to get yearly retinal exam.  Medication management -     Cancel: CBC with Differential/Platelet -     COMPLETE METABOLIC PANEL WITH GFR -     Magnesium  Vitamin D deficiency -     VITAMIN D 25 Hydroxy (Vit-D Deficiency, Fractures)  Personal history of renal cell carcinoma Monitor  Morbid obesity -     TSH Will do trial of phentermine with topamax- if this fails may do saxenda Discussed weight loss surgery Will do food diary- close follow up 1 month  Peripheral polyneuropathy/back pain -     ANA -     Anti-DNA antibody, double-stranded -     Copper, serum -     CK -     DG Lumbar Spine Complete; Future - normal exam Some back pain? Spinal stenosis- get Xray Close follow up 4 weeks  Anemia, unspecified type -     Iron,Total/Total Iron Binding Cap -     Ferritin -     Vitamin B12   Discussed med's effects and SE's. Screening labs and tests as requested with regular follow-up as recommended. Future Appointments  Date Time Provider [place]  02/15/2020 10:00 AM Vicie Mutters, PA-C [facility] None  09/25/2020  8:00 AM Lomax, Amy, NP [facility] None    HPI Patient presents for 46month follow and to discuss weight.Marland Kitchen  BMI is Body mass index is 38.36 kg/m., she has been trying to sleep well, she states she does not eat well. She has been doing intermittent fasting 9-10 starts to eat, eat at 1, and then 7 pm will  eat dinner and snack. She had success with phentermine in the past when she first started on it but did not have any success after that with trials.  Wt Readings from Last 3 Encounters:  10/16/19 203 lb (92.1 kg)  09/26/19 203 lb (92.1 kg)  05/23/19 193 lb 14.4 oz (88 kg)    She has bilateral leg stinging and numbness in her feet and feels swelling up her legs to her knee. Constant but worse with sitting and lying down. She has computer job and sits all day.  She has lower back pain, feels "tiredness" or feels her lower back is "seperating from her lower half", better with rest.  No pain down her legs. She has been feeling off balance for a while.   Had MRI lumbar 2007 IMPRESSION: 1.  Mild diffuse marrow heterogeneity likely due to chronic disease or anemia.  2.  No significant disc disease, spinal stenosis, or nerve root encroachment. 3.  No abnormal enhancement is noted.  She has rectocele, has constant heaviness from her vagina, has rectal pain, constipation, back pain, MRI showed 2.8 cm anterior rectal wall recectocele.  referred to Dr. Marvel Plan, GYN and Dr Bernerd Limbo, Urology.   Reports she is still has heaviness and feels like things are going to fall out  She was taking Merbetriq but states it was not helping with her  symptoms.  She underwent left laparoscopic nephrectomy by Dr. Link Snuffer on 09/19/2017 for left renal mass and carcinoma, had subsequent PE 10/09/2017, has been off eliquis.   She has depression and anxiety, she was following with [name], on lexapro, xanax 1-2 a day AS needed and wellbutrin. She was taken off buspar due to fatigue. She is on xanax 0.5mg , 1/2 in AM and PM.   She continues to have some fatigue but it has improved with repeat sleep study and titration redone 11/2018 and she adjusted it and she states she is doing better.   Lab Results  Component Value Date   [id number] 06/27/2018   Lab Results  Component Value Date   IRON 57 08/10/2017   TIBC  319 08/10/2017   FERRITIN 103 07/19/2016    Patient denies chest pain, shortness of breath, dizziness. BP: 132/74   Patient's cholesterol is diet controlled. The patient's cholesterol last visit was  Lab Results  Component Value Date   CHOL 158 02/14/2019   HDL 35 (L) 02/14/2019   LDLCALC 96 02/14/2019   TRIG 175 (H) 02/14/2019   CHOLHDL 4.5 02/14/2019   The patient has been working on diet and exercise for prediabetes, denies changes in vision, polys, and paresthesias. Last A1C in office was Lab Results  Component Value Date   HGBA1C 6.0 (H) 02/14/2019   Vitamin D deficiency, on vitamin d Lab Results  Component Value Date   VD25OH 67 02/14/2019   She has SLE and follows with [name], on plaquenil.  Current Medications:  Current Outpatient Medications on File Prior to Visit  Medication Sig Dispense Refill  . acetaminophen (TYLENOL) 500 MG tablet Take 1,000 mg by mouth every 6 (six) hours as needed for mild pain.    Marland Kitchen ALPRAZolam (XANAX) 0.5 MG tablet TAKE 1/2 TO 1 TABLET BY MOUTH 2 X PER DAY, SHOULD LAST LONGER THAN A MONTH, WILL DISCUSS NEXT OV 60 tablet 0  . buPROPion (WELLBUTRIN XL) 150 MG 24 hr tablet TAKE 1 TABLET EVERY MORNING FOR MOOD. 90 tablet 0  . Cholecalciferol (VITAMIN D) 2000 UNITS CAPS Take 1 capsule by mouth daily.    Marland Kitchen escitalopram (LEXAPRO) 20 MG tablet TAKE ONE AND A HALF TABLETS (30MG ) DAILY FOR MOOD & ANXIETY. (Patient taking differently: Take 30 mg by mouth daily. TAKE ONE AND A HALF TABLETS (30MG ) DAILY FOR MOOD & ANXIETY.) 135 tablet 2  . hydroxychloroquine (PLAQUENIL) 200 MG tablet Take 1 tablet 2 x /day for Arthritis (Patient taking differently: Take 400 mg by mouth daily. Take 1 tablet 2 x /day for Arthritis) 180 tablet 1  . linaclotide (LINZESS) 290 MCG CAPS capsule Take 290 mcg by mouth daily as needed (constipation).     . Magnesium 500 MG TABS Take 500 mg by mouth daily.     . Probiotic Product (PROBIOTIC DAILY PO) Take by mouth daily.     No  current facility-administered medications on file prior to visit.    Medical History:  Past Medical History:  Diagnosis Date  . Anxiety   . Chronic constipation    followed by dr Carlean Purl  . CKD (chronic kidney disease), stage III   . Enterocele   . History of cervical cancer    hx squamous cell cervical carcinoma,   s/p  radical hysterectomy 11-24-1999  . History of MRSA infection    2005--- buttock abscess  . History of pulmonary embolus (PE)    post op 10-09-2017 (surgery on 09-19-2017)  lower  range pulmonary artery,  resolved and completed 6 month Eliquis  . History of renal cell carcinoma    s/p  left nephrectomy 09-19-2017  (oncologist -- dr Alen Blew / urologsit-- dr bell)  . 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 Incisional hernia   . MDD (major depressive disorder)   . OA (osteoarthritis)   . OSA on CPAP    followed by dr dohmeier  . Pre-diabetes   . Rectocele   . RLS (restless legs syndrome) 01/24/2015   Per sleep study 05/2014   . Solitary right kidney    acquired 09-19-2017 s/p left nephrectomy  . SUI (stress urinary incontinence, female)    05-21-2019  improved with therapy  . Systemic lupus erythematosus ([place])    rheumotologist--- dr Amil Amen (lov 03-19-2019 in epic)  . Vitamin D deficiency   . Wears glasses    Allergies Allergies  Allergen Reactions  . Bactrim [Sulfamethoxazole-Trimethoprim]     GI upset  . Cymbalta [Duloxetine Hcl]     Sweating  . Acyclovir And Related Itching    SURGICAL HISTORY She  has a past surgical history that includes Cesarean section (x3   last one 1980); Laparoscopic nephrectomy, hand assisted (Left, 09/19/2017); Colonoscopy; EXPLORATORY LAPAROTOMY MODIFIED RADICAL HYSTERECTOMY WITH PELVIC LYMPHADENECTOMY AND RESECTION MECKEL'S DIVERTICULUM (11-24-1999 dr  Aldean Ast  @[facility] ); COLD KNIFE CERVICAL CONIZATION  (10/07/1999  @[facility] ); and Incisional hernia repair (N/A, 05/23/2019). FAMILY HISTORY Her family history includes Heart disease in her mother; Hyperlipidemia in her mother; Hypertension in her mother. SOCIAL HISTORY She  reports that she has never smoked. She has never used smokeless tobacco. She reports that she does not drink alcohol or use drugs.  Review of Systems  Constitutional: Positive for malaise/fatigue. Negative for chills, diaphoresis, fever and weight loss.  HENT: Negative for congestion, ear discharge, ear pain, hearing loss, nosebleeds, sinus pain, sore throat and tinnitus.   Eyes: Negative for blurred vision, double vision, photophobia, pain, discharge and redness.  Respiratory: Negative for cough, hemoptysis, sputum production, shortness of breath, wheezing and stridor.   Cardiovascular: Positive for leg swelling. Negative for chest pain, palpitations, orthopnea, claudication and PND.  Gastrointestinal: Negative for abdominal pain, blood in stool, constipation, diarrhea, heartburn, melena, nausea and vomiting.  Genitourinary: Negative for dysuria, flank pain, frequency, hematuria and urgency.  Musculoskeletal: Positive for back pain and myalgias. Negative for falls, joint pain and neck pain.  Skin: Negative for itching and rash.  Neurological: Positive for tingling (bilateral legs). Negative for dizziness, tremors, sensory change, speech change, focal weakness, seizures, loss of consciousness, weakness and headaches.  Endo/Heme/Allergies: Negative for environmental allergies and polydipsia. Does not bruise/bleed easily.  Psychiatric/Behavioral: Negative for depression, hallucinations, memory loss, substance abuse and suicidal ideas. The patient is not nervous/anxious and does not have insomnia.     Physical Exam: Estimated body mass index is 38.36 kg/m as calculated from the following:   Height as of 09/26/19: 5\' 1"  (1.549 m).    Weight as of this encounter: 203 lb (92.1 kg). Vitals:   10/16/19 1101  BP: 132/74  Temp: (!) 97.3 F (36.3 C)   General Appearance: Well nourished, in no apparent distress. Eyes: PERRLA, EOMs, conjunctiva no swelling or erythema, normal fundi and vessels. Sinuses: No Frontal/maxillary tenderness ENT/Mouth: Ext aud canals clear, normal light reflex with TMs without  erythema, bulging.  Good dentition. No erythema, swelling, or exudate on post pharynx. Tonsils not swollen or erythematous. Hearing normal. Crowded mouth.  Neck: Supple, thyroid normal. No bruits Respiratory: Respiratory effort normal, BS equal bilaterally without rales, rhonchi, wheezing or stridor. Cardio: RRR without murmurs, rubs or gallops. Brisk peripheral pulses with mild bilateral edema, some in bilateral feet.  Chest: symmetric, with normal excursions and percussion. Abdomen: Soft, obese +BS. Non tender, no guarding, rebound, hernias, masses, or organomegaly. .  Lymphatics: Non tender without lymphadenopathy.  Musculoskeletal: Full ROM all peripheral extremities,5/5 strength, and normal gait. Skin: Warm, dry without rashes, lesions, ecchymosis.  Neuro: Cranial nerves intact, reflexes equal bilaterally. Normal muscle tone, no cerebellar symptoms. Normal distal neurovascular exam. Negative romberg Sensation intact.  Psych: Awake and oriented X 3, normal affect, Insight and Judgment appropriate.      Garnet Sierras, NP [facility] 10/16/2019  11:10 AM

## 2019-10-16 NOTE — Patient Instructions (Addendum)
Check out  Mini habits for weight loss book  2 free apps for tracking food is myfitness pal  loseit  If you want more structured weight loss that you have to pay for, you can look into  [employer]  weight watchers  INFORMATION ABOUT YOUR XRAY  Can walk into [address] building for an Insurance account manager. They will have the order and take you back. You do not any paper work, I should get the result back today or tomorrow. This order is good for a year.  Can call [phone] to schedule an appointment if you wish.    Nuts are a healthy fat that can help you feel full HOWEVER they pack a lot of calories in a small amount. For weight loss, I suggest no more than 1 serving of nuts a day. A handful goes a long way. Here are some examples of the different types of nuts and the suggested serving a day.     Who Qualifies for Obesity Medications? Although everyone is hopeful for a fast and easy way to lose weight, nothing has been shown to replace a prudent, calorie-controlled diet along with behavior modification as a cornerstone for all obesity treatments.   The next tool that can be used to achieve weight-loss and health improvement is medication.   Pharmacotherapy may be offered to Individuals affected by obesity who have failed to achieve weight-loss through diet and exercise alone.  We have decided to try you on a weight loss medication here is some general information about this medication.    TOPIRAMATE Sometimes we will prescribe topiramate AND phentermine together to make Qsymia- separating the medications makes it cheaper.  Or topiramate can be used by itself for weight loss at night.   How to start it start on 1/2 pill for 3-5 nights, can increase to a whole pill for 1-2 weeks.  This medication is good for weight loss, headaches, pain This medication can cause numbness, tingling and can cause brain fog- stop if you get these Check with your eye doctor if you have a history of glaucoma and  stop if you severe vision changes or blurry vision.  There is a long acting medication that we can switch to if you have numbness,tingling or brain fog.   Topiramate has been associated with an increased risk of birth defects- and should NOT be taken if pregnant or becoming pregnant.   Follow-up Visits: Frequent visits (every 3 to 4 weeks) are encouraged until initial weight-loss goals (5 to 10 percent of body weight) are achieved.   At that point, less frequent visits are typically scheduled as needed for individual patients. However, since obesity is considered a chronic life-long problem for many individuals, periodic continual follow up is recommended.  Research has shown that weight-loss as low as 5 percent of initial body weight can lead to favorable improvements in blood pressure, cholesterol, glucose levels and insulin sensitivity. The risk of developing heart disease is reduced the most in patients who have impaired glucose tolerance, type 2 diabetes or high blood pressure.   You have been referred to Bariatric Surgery, by your healthcare provider. The [facility] Program at [facility] requires you to complete a free scheduled weight loss seminar or webinar before being scheduled for a consultation.   Please go to the link below to register for a free weight loss seminar or webinar. or call [phone].  Once completed you will be given instructions for the next steps.   [facility] at [facility]:   [email or web address]      Phone:  [phone]  The 2 different weight loss surgeries are that I can discuss with you is the Gastric sleeve or the gastric bypass.   You will have to see a nutritionist and psychiatrist before and after any of these surgeries.  They help decrease your hunger but they do not fix your relationship with food or unhealthy habits.  In order for these surgeries to be most  successful and for you to keep the weight off you have to work on those aspects as well or the weight will come back.   They are both done laparoscopically, these means there is not a large incision, it is done with tiny incisions made in your abdomen and then a scope is inserted. This helps significantly with recovery and healing time. Very rarely would they have to switch to an open surgery but that is one of the potential complications.   Gastric sleeve The rate of complication with the gastric sleeve is about 1 %.  Weight loss with the gastric sleeve is about 60-80lbs.  80% of your stomach is removed but your intestines remain the where they are.  The part of your stomach that is removed release hunger hormones so this surgery causes you to be less hungry as well not be able to hold a large amount of food in your stomach.  This one has less nutritionally issues such at vitamin absorption after the surgery than the gastric bypass.  Gastric Bypass The rate of complication with the gastric bypass is about 2-3% Weight loss with the gastric bypass is 100 lbs or more A large portion of your stomach is removed so that it is only the shape of an egg, then part of your intestines is bypassed.  This decreases hunger hormones and if you have diabetes is almost "cured" right after surgery, we are still studying why that is the case and it will hopefully lead to future treatments for diabetes.  Since we are bypassing part of the intestines where absorption of vitamins occur requires you to take vitamins daily for the rest of your life  You have to be more focused on protein and water intake  You need to adjust your eating habits after the surgery drastically afterwards but it has the most weight loss associated with it.

## 2019-10-17 ENCOUNTER — Other Ambulatory Visit: Payer: Self-pay | Admitting: Physician Assistant

## 2019-10-19 LAB — LIPID PANEL
Cholesterol: 166 mg/dL (ref <200)
HDL: 35 mg/dL — ABNORMAL LOW (ref >=50)
LDL Cholesterol (Calc): 106 mg/dL (calc) — ABNORMAL HIGH
Non-HDL Cholesterol (Calc): 131 mg/dL (calc) — ABNORMAL HIGH (ref <130)
Total CHOL/HDL Ratio: 4.7 (calc) (ref <5.0)
Triglycerides: 140 mg/dL (ref <150)

## 2019-10-19 LAB — COMPLETE METABOLIC PANEL WITH GFR
AG Ratio: 1.1 (calc) (ref 1.0–2.5)
ALT: 15 U/L (ref 6–29)
AST: 20 U/L (ref 10–35)
Albumin: 3.9 g/dL (ref 3.6–5.1)
Alkaline phosphatase (APISO): 50 U/L (ref 37–153)
BUN/Creatinine Ratio: 18 (calc) (ref 6–22)
BUN: 19 mg/dL (ref 7–25)
CO2: 27 mmol/L (ref 20–32)
Calcium: 9.3 mg/dL (ref 8.6–10.4)
Chloride: 109 mmol/L (ref 98–110)
Creat: 1.04 mg/dL — ABNORMAL HIGH (ref 0.50–0.99)
GFR, Est African American: 66 mL/min/{1.73_m2} (ref >=60)
GFR, Est Non African American: 57 mL/min/{1.73_m2} — ABNORMAL LOW (ref >=60)
Globulin: 3.5 g/dL (calc) (ref 1.9–3.7)
Glucose, Bld: 80 mg/dL (ref 65–99)
Potassium: 5 mmol/L (ref 3.5–5.3)
Sodium: 142 mmol/L (ref 135–146)
Total Bilirubin: 0.3 mg/dL (ref 0.2–1.2)
Total Protein: 7.4 g/dL (ref 6.1–8.1)

## 2019-10-19 LAB — CBC WITH DIFFERENTIAL/PLATELET
Absolute Monocytes: 526 cells/uL (ref 200–950)
Basophils Absolute: 22 cells/uL (ref 0–200)
Basophils Relative: 0.6 %
Eosinophils Absolute: 108 cells/uL (ref 15–500)
Eosinophils Relative: 3 %
HCT: 39.2 % (ref 35.0–45.0)
Hemoglobin: 12.7 g/dL (ref 11.7–15.5)
Lymphs Abs: 1865 cells/uL (ref 850–3900)
MCH: 29.4 pg (ref 27.0–33.0)
MCHC: 32.4 g/dL (ref 32.0–36.0)
MCV: 90.7 fL (ref 80.0–100.0)
MPV: 9 fL (ref 7.5–12.5)
Monocytes Relative: 14.6 %
Neutro Abs: 1080 cells/uL — ABNORMAL LOW (ref 1500–7800)
Neutrophils Relative %: 30 %
Platelets: 247 10*3/uL (ref 140–400)
RBC: 4.32 10*6/uL (ref 3.80–5.10)
RDW: 12.8 % (ref 11.0–15.0)
Total Lymphocyte: 51.8 %
WBC: 3.6 10*3/uL — ABNORMAL LOW (ref 3.8–10.8)

## 2019-10-19 LAB — FERRITIN: Ferritin: 68 ng/mL (ref 16–288)

## 2019-10-19 LAB — IRON, TOTAL/TOTAL IRON BINDING CAP
%SAT: 15 % (calc) — ABNORMAL LOW (ref 16–45)
Iron: 49 ug/dL (ref 45–160)
TIBC: 334 mcg/dL (calc) (ref 250–450)

## 2019-10-19 LAB — ANTI-NUCLEAR AB-TITER (ANA TITER)
ANA TITER: 1:1280 {titer} — ABNORMAL HIGH
ANA Titer 1: 1:320 {titer} — ABNORMAL HIGH

## 2019-10-19 LAB — COPPER, SERUM: Copper: 109 ug/dL (ref 70–175)

## 2019-10-19 LAB — ANTI-DNA ANTIBODY, DOUBLE-STRANDED: ds DNA Ab: 4 IU/mL

## 2019-10-19 LAB — TSH: TSH: 0.97 mIU/L (ref 0.40–4.50)

## 2019-10-19 LAB — HEMOGLOBIN A1C
Hgb A1c MFr Bld: 5.8 % of total Hgb — ABNORMAL HIGH (ref <5.7)
Mean Plasma Glucose: 120 (calc)
eAG (mmol/L): 6.6 (calc)

## 2019-10-19 LAB — MAGNESIUM: Magnesium: 2 mg/dL (ref 1.5–2.5)

## 2019-10-19 LAB — ANA: Anti Nuclear Antibody (ANA): POSITIVE — AB

## 2019-10-19 LAB — VITAMIN D 25 HYDROXY (VIT D DEFICIENCY, FRACTURES): Vit D, 25-Hydroxy: 58 ng/mL (ref 30–100)

## 2019-10-19 LAB — VITAMIN B12: Vitamin B-12: 868 pg/mL (ref 200–1100)

## 2019-10-19 LAB — CK: Total CK: 184 U/L — ABNORMAL HIGH (ref 29–143)

## 2019-11-25 NOTE — Progress Notes (Signed)
64 y.o.female presents for a follow up after being on TOPAMAX for weight loss.  Not on phentermine at this time.  While on the medication they have lost 10 lbs since last visit which was 6 weeks ago.   She started on tumeric 3 x a day,helping with back and leg pain.  Had elevated CPK last visit, will recheck that with LFTs since on tumeric.  No weakness on exam.   BMI is Body mass index is 36.47 kg/m., she is working on diet and exercise. Wt Readings from Last 3 Encounters:  11/26/19 193 lb (87.5 kg)  10/16/19 203 lb (92.1 kg)  09/26/19 203 lb (92.1 kg)   She is not doing intermittent fasting anymore.  She is tracking her food. She is trying to eat better.   Medications:     Current Outpatient Medications (Analgesics):  .  acetaminophen (TYLENOL) 500 MG tablet, Take 1,000 mg by mouth every 6 (six) hours as needed for mild pain.   Current Outpatient Medications (Other):  Marland Kitchen  ALPRAZolam (XANAX) 0.5 MG tablet, TAKE 1/2 TO 1 TABLET BY MOUTH 2 X PER DAY, SHOULD LAST LONGER THAN A MONTH, WILL DISCUSS NEXT OV .  buPROPion (WELLBUTRIN XL) 150 MG 24 hr tablet, TAKE 1 TABLET EVERY MORNING FOR MOOD. Marland Kitchen  Cholecalciferol (VITAMIN D) 2000 UNITS CAPS, Take 1 capsule by mouth daily. Marland Kitchen  escitalopram (LEXAPRO) 20 MG tablet, TAKE ONE AND A HALF TABLETS (30MG ) DAILY FOR MOOD & ANXIETY. (Patient taking differently: Take 30 mg by mouth daily. TAKE ONE AND A HALF TABLETS (30MG ) DAILY FOR MOOD & ANXIETY.) .  hydroxychloroquine (PLAQUENIL) 200 MG tablet, Take 1 tablet 2 x /day for Arthritis (Patient taking differently: Take 400 mg by mouth daily. Take 1 tablet 2 x /day for Arthritis) .  linaclotide (LINZESS) 290 MCG CAPS capsule, Take 290 mcg by mouth daily as needed (constipation).  .  Magnesium 500 MG TABS, Take 500 mg by mouth daily.  .  Probiotic Product (PROBIOTIC DAILY PO), Take by mouth daily. Marland Kitchen  topiramate (TOPAMAX) 50 MG tablet, Take 1 tablet (50 mg total) by mouth 2 (two) times daily.  ROS: All  negative except for above  Physical exam: Vitals:   11/26/19 1113  BP: 128/80  Temp: 97.8 F (36.6 C)   Physical Exam  Assessment: Obesity with co morbid conditions.   Plan: She will work on meal planning, intentional eating, and increasing water. She will try to add more veggies  She has been instructed to work up to a goal of 150 minutes of combined cardio and strengthening exercise per week for weight loss and overall health benefits.   She has agreed to follow-up with our clinic in 3 months.   Future Appointments  Date Time Provider [place]  02/15/2020 10:00 AM Vicie Mutters, PA-C [facility] None  09/25/2020  8:00 AM Lomax, Amy, NP [facility] None

## 2019-11-26 ENCOUNTER — Other Ambulatory Visit: Payer: Self-pay

## 2019-11-26 ENCOUNTER — Ambulatory Visit (INDEPENDENT_AMBULATORY_CARE_PROVIDER_SITE_OTHER): Payer: BC Managed Care – PPO | Admitting: Physician Assistant

## 2019-11-26 ENCOUNTER — Encounter: Payer: Self-pay | Admitting: Physician Assistant

## 2019-11-26 VITALS — BP 128/80 | Temp 97.8°F | Wt 193.0 lb

## 2019-11-26 DIAGNOSIS — M791 Myalgia, unspecified site: Secondary | ICD-10-CM | POA: Diagnosis not present

## 2019-11-26 DIAGNOSIS — Z79899 Other long term (current) drug therapy: Secondary | ICD-10-CM | POA: Diagnosis not present

## 2019-11-26 MED ORDER — TOPIRAMATE 50 MG PO TABS: 50.0000 mg | ORAL_TABLET | Freq: Two times a day (BID) | ORAL | 1 refills | Status: DC

## 2019-11-26 NOTE — Patient Instructions (Signed)

## 2019-11-27 DIAGNOSIS — N393 Stress incontinence (female) (male): Secondary | ICD-10-CM

## 2019-11-27 DIAGNOSIS — K5909 Other constipation: Secondary | ICD-10-CM

## 2019-11-27 DIAGNOSIS — N816 Rectocele: Secondary | ICD-10-CM

## 2019-11-27 LAB — CBC WITH DIFFERENTIAL/PLATELET
Absolute Monocytes: 411 cells/uL (ref 200–950)
Basophils Absolute: 19 cells/uL (ref 0–200)
Basophils Relative: 0.5 %
Eosinophils Absolute: 48 cells/uL (ref 15–500)
Eosinophils Relative: 1.3 %
HCT: 39.1 % (ref 35.0–45.0)
Hemoglobin: 12.9 g/dL (ref 11.7–15.5)
Lymphs Abs: 1954 cells/uL (ref 850–3900)
MCH: 29.8 pg (ref 27.0–33.0)
MCHC: 33 g/dL (ref 32.0–36.0)
MCV: 90.3 fL (ref 80.0–100.0)
MPV: 9.3 fL (ref 7.5–12.5)
Monocytes Relative: 11.1 %
Neutro Abs: 1269 cells/uL — ABNORMAL LOW (ref 1500–7800)
Neutrophils Relative %: 34.3 %
Platelets: 249 10*3/uL (ref 140–400)
RBC: 4.33 10*6/uL (ref 3.80–5.10)
RDW: 13.4 % (ref 11.0–15.0)
Total Lymphocyte: 52.8 %
WBC: 3.7 10*3/uL — ABNORMAL LOW (ref 3.8–10.8)

## 2019-11-27 LAB — COMPLETE METABOLIC PANEL WITH GFR
AG Ratio: 1.3 (calc) (ref 1.0–2.5)
ALT: 14 U/L (ref 6–29)
AST: 16 U/L (ref 10–35)
Albumin: 4 g/dL (ref 3.6–5.1)
Alkaline phosphatase (APISO): 44 U/L (ref 37–153)
BUN/Creatinine Ratio: 12 (calc) (ref 6–22)
BUN: 15 mg/dL (ref 7–25)
CO2: 24 mmol/L (ref 20–32)
Calcium: 9.3 mg/dL (ref 8.6–10.4)
Chloride: 110 mmol/L (ref 98–110)
Creat: 1.27 mg/dL — ABNORMAL HIGH (ref 0.50–0.99)
GFR, Est African American: 52 mL/min/{1.73_m2} — ABNORMAL LOW (ref >=60)
GFR, Est Non African American: 45 mL/min/{1.73_m2} — ABNORMAL LOW (ref >=60)
Globulin: 3.1 g/dL (calc) (ref 1.9–3.7)
Glucose, Bld: 86 mg/dL (ref 65–99)
Potassium: 4.1 mmol/L (ref 3.5–5.3)
Sodium: 139 mmol/L (ref 135–146)
Total Bilirubin: 0.3 mg/dL (ref 0.2–1.2)
Total Protein: 7.1 g/dL (ref 6.1–8.1)

## 2019-11-27 LAB — CK: Total CK: 123 U/L (ref 29–143)

## 2019-11-27 LAB — ALDOLASE: Aldolase: 3.2 U/L (ref <=8.1)

## 2019-12-05 ENCOUNTER — Other Ambulatory Visit: Payer: Self-pay | Admitting: Physician Assistant

## 2020-01-03 ENCOUNTER — Ambulatory Visit (INDEPENDENT_AMBULATORY_CARE_PROVIDER_SITE_OTHER): Payer: BC Managed Care – PPO | Admitting: Physician Assistant

## 2020-01-03 ENCOUNTER — Other Ambulatory Visit: Payer: Self-pay

## 2020-01-03 ENCOUNTER — Encounter: Payer: Self-pay | Admitting: Physician Assistant

## 2020-01-03 VITALS — BP 122/84 | Temp 97.4°F | Wt 195.0 lb

## 2020-01-03 DIAGNOSIS — I10 Essential (primary) hypertension: Secondary | ICD-10-CM | POA: Diagnosis not present

## 2020-01-03 DIAGNOSIS — R7309 Other abnormal glucose: Secondary | ICD-10-CM

## 2020-01-03 DIAGNOSIS — E559 Vitamin D deficiency, unspecified: Secondary | ICD-10-CM

## 2020-01-03 DIAGNOSIS — E785 Hyperlipidemia, unspecified: Secondary | ICD-10-CM | POA: Diagnosis not present

## 2020-01-03 DIAGNOSIS — Z79899 Other long term (current) drug therapy: Secondary | ICD-10-CM

## 2020-01-03 NOTE — Patient Instructions (Signed)
  Nuts are a healthy fat that can help you feel full HOWEVER they pack a lot of calories in a small amount. For weight loss, I suggest no more than 1 serving of nuts a day. A handful goes a long way. Here are some examples of the different types of nuts and the suggested serving a day.        When it comes to diets, agreement about the perfect plan isn't easy to find, even among the experts. Experts at the [employer] developed an idea known as the Healthy Eating Plate. Just imagine a plate divided into logical, healthy portions.  The emphasis is on diet quality:  Load up on vegetables and fruits - one-half of your plate: Aim for color and variety, and remember that potatoes don't count.  Go for whole grains - one-quarter of your plate: Whole wheat, barley, wheat berries, quinoa, oats, brown rice, and foods made with them. If you want pasta, go with whole wheat pasta.  Protein power - one-quarter of your plate: Fish, chicken, beans, and nuts are all healthy, versatile protein sources. Limit red meat.  The diet, however, does go beyond the plate, offering a few other suggestions.  Use healthy plant oils, such as olive, canola, soy, corn, sunflower and peanut. Check the labels, and avoid partially hydrogenated oil, which have unhealthy trans fats.  If you're thirsty, drink water. Coffee and tea are good in moderation, but skip sugary drinks and limit milk and dairy products to one or two daily servings.  The type of carbohydrate in the diet is more important than the amount. Some sources of carbohydrates, such as vegetables, fruits, whole grains, and beans--are healthier than others.  Finally, stay active.

## 2020-01-03 NOTE — Progress Notes (Signed)
64 y.o.female presents for a follow up. She could not tolerate phentermine, wanted to get off topamax due to kidney issues. She will follow up with [name]   Lab Results  Component Value Date   CREATININE 1.27 (H) 11/26/2019   BUN 15 11/26/2019   NA 139 11/26/2019   K 4.1 11/26/2019   CL 110 11/26/2019   CO2 24 11/26/2019   Lab Results  Component Value Date   GFRAA 52 (L) 11/26/2019      BMI is Body mass index is 36.84 kg/m., she is working on diet and exercise. Wt Readings from Last 3 Encounters:  01/03/20 195 lb (88.5 kg)  11/26/19 193 lb (87.5 kg)  10/16/19 203 lb (92.1 kg)   She is not doing intermittent fasting anymore.  She is tracking her food. She is trying to eat better. Went over this with her and discussed measuring foods/sizes better, she was underestimating calories.    Medications:     Current Outpatient Medications (Analgesics):  .  acetaminophen (TYLENOL) 500 MG tablet, Take 1,000 mg by mouth every 6 (six) hours as needed for mild pain.   Current Outpatient Medications (Other):  Marland Kitchen  ALPRAZolam (XANAX) 0.5 MG tablet, TAKE 1/2 TO 1 TABLET BY MOUTH 2 X PER DAY, SHOULD LAST LONGER THAN A MONTH, WILL DISCUSS NEXT OV .  buPROPion (WELLBUTRIN XL) 150 MG 24 hr tablet, TAKE 1 TABLET EVERY MORNING FOR MOOD. Marland Kitchen  Cholecalciferol (VITAMIN D) 2000 UNITS CAPS, Take 1 capsule by mouth daily. Marland Kitchen  escitalopram (LEXAPRO) 20 MG tablet, TAKE ONE AND A HALF TABLETS (30MG ) DAILY FOR MOOD & ANXIETY. (Patient taking differently: Take 30 mg by mouth daily. TAKE ONE AND A HALF TABLETS (30MG ) DAILY FOR MOOD & ANXIETY.) .  hydroxychloroquine (PLAQUENIL) 200 MG tablet, Take 1 tablet 2 x /day for Arthritis (Patient taking differently: Take 400 mg by mouth daily. Take 1 tablet 2 x /day for Arthritis) .  linaclotide (LINZESS) 290 MCG CAPS capsule, Take 290 mcg by mouth daily as needed (constipation).  .  Magnesium 500 MG TABS, Take 500 mg by mouth daily.  .  Probiotic Product (PROBIOTIC  DAILY PO), Take by mouth daily.  ROS: All negative except for above  Physical exam: Vitals:   01/03/20 0947  BP: 122/84  Temp: (!) 97.4 F (36.3 C)   Physical Exam  Assessment: Obesity with co morbid conditions.   Plan: She will work on meal planning, intentional eating, and increasing water. She will try to add more veggies  She has been instructed to work up to a goal of 150 minutes of combined cardio and strengthening exercise per week for weight loss and overall health benefits.   She has agreed to follow-up with our clinic in 2 months.   Future Appointments  Date Time Provider [place]  02/15/2020 10:00 AM Vicie Mutters, PA-C [facility] None  09/25/2020  8:00 AM Lomax, Amy, NP [facility] None

## 2020-01-04 ENCOUNTER — Ambulatory Visit: Payer: BC Managed Care – PPO | Admitting: Neurology

## 2020-01-04 LAB — COMPLETE METABOLIC PANEL WITH GFR
AG Ratio: 1.2 (calc) (ref 1.0–2.5)
ALT: 13 U/L (ref 6–29)
AST: 18 U/L (ref 10–35)
Albumin: 3.9 g/dL (ref 3.6–5.1)
Alkaline phosphatase (APISO): 44 U/L (ref 37–153)
BUN/Creatinine Ratio: 14 (calc) (ref 6–22)
BUN: 15 mg/dL (ref 7–25)
CO2: 25 mmol/L (ref 20–32)
Calcium: 9.5 mg/dL (ref 8.6–10.4)
Chloride: 108 mmol/L (ref 98–110)
Creat: 1.1 mg/dL — ABNORMAL HIGH (ref 0.50–0.99)
GFR, Est African American: 61 mL/min/{1.73_m2} (ref >=60)
GFR, Est Non African American: 53 mL/min/{1.73_m2} — ABNORMAL LOW (ref >=60)
Globulin: 3.3 g/dL (calc) (ref 1.9–3.7)
Glucose, Bld: 83 mg/dL (ref 65–99)
Potassium: 4.2 mmol/L (ref 3.5–5.3)
Sodium: 139 mmol/L (ref 135–146)
Total Bilirubin: 0.4 mg/dL (ref 0.2–1.2)
Total Protein: 7.2 g/dL (ref 6.1–8.1)

## 2020-01-05 ENCOUNTER — Other Ambulatory Visit: Payer: Self-pay | Admitting: Internal Medicine

## 2020-01-15 ENCOUNTER — Other Ambulatory Visit: Payer: Self-pay | Admitting: Internal Medicine

## 2020-01-15 DIAGNOSIS — F3341 Major depressive disorder, recurrent, in partial remission: Secondary | ICD-10-CM

## 2020-01-15 DIAGNOSIS — Z79899 Other long term (current) drug therapy: Secondary | ICD-10-CM

## 2020-01-15 DIAGNOSIS — F419 Anxiety disorder, unspecified: Secondary | ICD-10-CM

## 2020-01-16 ENCOUNTER — Other Ambulatory Visit: Payer: Self-pay | Admitting: Physician Assistant

## 2020-02-01 ENCOUNTER — Ambulatory Visit: Payer: BC Managed Care – PPO | Admitting: Physician Assistant

## 2020-02-04 ENCOUNTER — Other Ambulatory Visit: Payer: Self-pay | Admitting: Internal Medicine

## 2020-02-04 DIAGNOSIS — Z1231 Encounter for screening mammogram for malignant neoplasm of breast: Secondary | ICD-10-CM

## 2020-02-13 NOTE — Progress Notes (Signed)
Complete Physical  Assessment and Plan:  Chronic constipation Continue linzess.   Polyneuropathy Bilateral decreased sensation to monofilament test, no muscular weakness.  Some bilateral hip weakness/pain with standing long time.  Normal B12, normal RPR, HIV.  May need EMG studies Normal MRI lumbar in 2007 Increase exercise, follow up neuro, will monitor  Supraclavicular fossa fullness -     US Soft Tissue Head/Neck; Future  Essential hypertension - continue medications, DASH diet, exercise and monitor at home. Call if greater than 130/80.  -     CBC with Differential/Platelet -     COMPLETE METABOLIC PANEL WITH GFR -     TSH -     Urinalysis, Routine w reflex microscopic -     Microalbumin / creatinine urine ratio  OSA (obstructive sleep apnea) Sleep apnea- continue CPAP, CPAP is helping with daytime fatigue, weight loss still advised.   Renal cell carcinoma, unspecified laterality (HCC) S/p nephrectomy and doing well  Systemic lupus erythematosus, unspecified SLE type, unspecified organ involvement status ([place]) Continue meds and follow up Encourage follow up, has not been seen in 3 years.  Having some fatigue, leg pain, rule out association with lupus  Abnormal glucose Discussed disease progression and risks Discussed diet/exercise, weight management and risk modification -     Hemoglobin A1c  Hyperlipidemia, unspecified hyperlipidemia type check lipids decrease fatty foods increase activity.  -     Lipid panel  Vitamin D deficiency -     VITAMIN D 25 Hydroxy (Vit-D Deficiency, Fractures)  Obesity (BMI 30.0-34.9) - follow up 3 months for progress monitoring - increase veggies, decrease carbs - stop phentermine, not helping with weight loss.  - long discussion about weight loss, diet, and exercise  Medication management -     Magnesium  RLS (restless legs syndrome) Increase walking  H/O left nephrectomy Well healed and doing well Has very small  ventral hernia, has follow up tomorrow.   Recurrent major depressive disorder, in partial remission ([place]) - continue medications, stress management techniques discussed, increase water, good sleep hygiene discussed, increase exercise, and increase veggies.   Anxiety Continue meds, will try to decrease xanax use  History of pulmonary embolus (PE) Provoked, off anticoagulant at this time though patient reminds high risk with lupus, will monitor closely.   Rectocele She is following with urology, Dr. Marney Doctor Doing pelvic floor PT for that.   Discussed med's effects and SE's. Screening labs and tests as requested with regular follow-up as recommended. Future Appointments  Date Time Provider [place]  02/28/2020  7:30 AM [facility] MM 3 [facility]  09/25/2020  8:00 AM Lomax, Amy, NP [facility] None  02/17/2021  9:00 AM McClanahan, Kyra, NP [facility] None    HPI Patient presents for complete physical.    She has rectocele, doing pelvic floor PT, states this is not helping. She is following with urology. Has followed with Dr. Zigmund Daniel. Zanaflex states this has helped some.   She has SLE and follows with [name], on plaquenil. Has OV in Nov with him.   Lab Results  Component Value Date   [id number] 10/16/2019  HIV negative, neg RPR 2016.   BMI is Body mass index is 36.58 kg/m., she is working on diet and exercise.She is on phentermine but has not lost weight, states it is helping with energy but not with weight loss. Will stop.  Wt Readings from Last 3 Encounters:  02/15/20 196 lb 12.8 oz (89.3 kg)  01/03/20 195 lb (88.5 kg)  11/26/19 193 lb (87.5 kg)   She underwent left laparoscopic nephrectomy by Dr. Link Snuffer on 09/19/2017 for left renal mass and carcinoma, had subsequent provoked PE.  Currrently not on a medication.  She has depression and anxiety, was following with [name].  She is feeling that she is doing better with her anxiety.    Patient denies  chest pain, shortness of breath, dizziness. BP: 122/68   Patient's cholesterol is diet controlled. The patient's cholesterol last visit was  Lab Results  Component Value Date   CHOL 166 10/16/2019   HDL 35 (L) 10/16/2019   LDLCALC 106 (H) 10/16/2019   TRIG 140 10/16/2019   CHOLHDL 4.7 10/16/2019   The patient has been working on diet and exercise for prediabetes, denies changes in vision, polys, and paresthesias. Last A1C in office was Lab Results  Component Value Date   HGBA1C 5.8 (H) 10/16/2019   Vitamin D deficiency, on vitamin d Lab Results  Component Value Date   VD25OH 2 10/16/2019     Current Medications:      Current Outpatient Medications (Analgesics):  .  acetaminophen (TYLENOL) 500 MG tablet, Take 1,000 mg by mouth every 6 (six) hours as needed for mild pain.   Current Outpatient Medications (Other):  Marland Kitchen  ALPRAZolam (XANAX) 0.5 MG tablet, TAKE 1/2 TO 1 TABLET BY MOUTH 2 x per day, limit to 5 days a week to avoid tolerance and addiction. SHOULD LAST LONGER THAN A MONTH. Marland Kitchen  buPROPion (WELLBUTRIN XL) 150 MG 24 hr tablet, Take 1 tablet every Morning for Mood, Focus & Concentration .  Cholecalciferol (VITAMIN D) 2000 UNITS CAPS, Take 1 capsule by mouth daily. Marland Kitchen  escitalopram (LEXAPRO) 20 MG tablet, Take 1.5 tablets (30 mg total) by mouth daily. TAKE ONE AND A HALF TABLETS (30MG ) DAILY FOR MOOD & ANXIETY. .  hydroxychloroquine (PLAQUENIL) 200 MG tablet, Take 1 tablet 2 x /day for Arthritis (Patient taking differently: Take 400 mg by mouth daily. Take 1 tablet 2 x /day for Arthritis) .  linaclotide (LINZESS) 290 MCG CAPS capsule, Take 290 mcg by mouth daily as needed (constipation).  .  Magnesium 500 MG TABS, Take 500 mg by mouth daily.  .  Probiotic Product (PROBIOTIC DAILY PO), Take by mouth daily.  Health Maintenance:   Immunization History  Administered Date(s) Administered  . Moderna SARS-COVID-2 Vaccination 07/26/2019, 08/28/2019  . Pneumococcal  Polysaccharide-23 01/07/2016  . Td 05/19/2003  . Tdap 05/23/2013   Health Maintenance  Topic Date Due  . PAP SMEAR-Modifier  07/05/2019  . MAMMOGRAM  02/26/2021  . TETANUS/TDAP  05/24/2023  . COLONOSCOPY  11/07/2023  . COVID-19 Vaccine  Completed  . Hepatitis C Screening  Completed  . HIV Screening  Completed  . INFLUENZA VACCINE  Discontinued    TDAP: 2015 Pneumovax: 2017 Flu vaccine: declines Prevnar: N/A Zostavax: N/A  Pap: 2016 neg Boyd/Richardson, NP MGM: 02/2020- category C- neg DEXA: 2007 normal Colonoscopy: Dr. Alice Reichert in [place] 10/2013 EGD:N/A CT head 2005 CXR 2005 MRI Lumbar spine 2007 Echo 2005 Sleep study 2020  Patient Care Team: [name], MD as PCP - General (Internal Medicine) Signe Colt, MD as Referring Physician (Obstetrics and Gynecology) Kristeen Miss, MD as Consulting Physician (Neurosurgery) Hennie Duos, MD as Consulting Physician (Rheumatology) Efrain Sella, MD as Referring Physician (Internal Medicine) Ara Kussmaul, MD as Consulting Physician (Ophthalmology) Bjorn Loser, MD as Consulting Physician (Urology)  Medical History:  Past Medical History:  Diagnosis Date  . Anxiety   .  Chronic constipation    followed by dr Carlean Purl  . CKD (chronic kidney disease), stage III ([place])   . Enterocele   . History of cervical cancer    hx squamous cell cervical carcinoma,   s/p  radical hysterectomy 11-24-1999  . History of MRSA infection    2005--- buttock abscess  . History of pulmonary embolus (PE)    post op 10-09-2017 (surgery on 09-19-2017)  lower range pulmonary artery,  resolved and completed 6 month Eliquis  . History of renal cell carcinoma    s/p  left nephrectomy 09-19-2017  (oncologist -- dr Alen Blew / urologsit-- dr bell)  . 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 Incisional hernia   . MDD (major depressive disorder)   . OA (osteoarthritis)   . OSA on CPAP    followed by dr dohmeier  . Pre-diabetes   . Rectocele   . RLS (restless legs syndrome) 01/24/2015   Per sleep study 05/2014   . Solitary right kidney    acquired 09-19-2017 s/p left nephrectomy  . SUI (stress urinary incontinence, female)    05-21-2019  improved with therapy  . Systemic lupus erythematosus ([place])    rheumotologist--- dr Amil Amen (lov 03-19-2019 in epic)  . Vitamin D deficiency   . Wears glasses    Allergies Allergies  Allergen Reactions  . Bactrim [Sulfamethoxazole-Trimethoprim]     GI upset  . Cymbalta [Duloxetine Hcl]     Sweating  . Acyclovir And Related Itching    SURGICAL HISTORY She  has a past surgical history that includes Cesarean section (x3   last one 1980); Laparoscopic nephrectomy, hand assisted (Left, 09/19/2017); Colonoscopy; EXPLORATORY LAPAROTOMY MODIFIED RADICAL HYSTERECTOMY WITH PELVIC LYMPHADENECTOMY AND RESECTION MECKEL'S DIVERTICULUM (11-24-1999 dr Aldean Ast  @[facility] ); COLD KNIFE CERVICAL CONIZATION  (10/07/1999  @[facility] ); and Incisional hernia repair (N/A, 05/23/2019). FAMILY HISTORY Her family history includes Heart disease in her mother; Hyperlipidemia in her mother; Hypertension in her mother. SOCIAL HISTORY She  reports that she has never smoked. She has never used smokeless tobacco. She reports that she does not drink alcohol and does not use drugs.  Review of Systems  Constitutional: Negative.  Negative for chills and fever.  HENT: Negative.   Eyes: Negative.   Respiratory: Negative.   Cardiovascular: Negative.   Gastrointestinal: Positive for constipation (better with linzess). Negative for abdominal pain, blood in stool, diarrhea, heartburn, melena, nausea and vomiting.  Genitourinary: Positive for frequency and urgency.  Musculoskeletal: Positive for back pain (bilateral lower bacck, tired feeling in  bilateral lower hips) and myalgias. Negative for falls, joint pain and neck pain.  Skin: Negative.  Negative for rash.  Neurological: Positive for tingling (bilateral legs). Negative for dizziness, tremors and headaches.  Endo/Heme/Allergies: Negative.   Psychiatric/Behavioral: Negative for depression and memory loss. The patient is not nervous/anxious and does not have insomnia.     Physical Exam: Estimated body mass index is 36.58 kg/m as calculated from the following:   Height as of this encounter: 5' 1.5" (1.562 m).   Weight as of this encounter: 196 lb 12.8 oz (89.3 kg). Vitals:   02/15/20 1002  BP: 122/68  Temp: 97.7 F (36.5 C)   General Appearance: Well nourished, in no apparent distress. Eyes: PERRLA, EOMs, conjunctiva no swelling or erythema, normal fundi and vessels. Sinuses:  No Frontal/maxillary tenderness ENT/Mouth: Ext aud canals clear, normal light reflex with TMs without erythema, bulging.  Good dentition. No erythema, swelling, or exudate on post pharynx. Tonsils not swollen or erythematous. Hearing normal. Crowded mouth.  Neck: Supple, thyroid normal. No bruits Respiratory: Respiratory effort normal, BS equal bilaterally without rales, rhonchi, wheezing or stridor. Cardio: RRR without murmurs, rubs or gallops. Brisk peripheral pulses without edema.  Chest: symmetric, with normal excursions and percussion. Breasts: breasts appear normal, no suspicious masses, no skin or nipple changes or axillary nodes. Abdomen: Soft, obese +BS. Non tender, no guarding, rebound, masses, or organomegaly. Very small ventral hernia along well healed vertical abdominal scar, easily retractile.  Lymphatics: Left supraclavicular fullness on the left with mobile nontender nodule. Non tender without lymphadenopathy.  Genitourinary: defer Musculoskeletal: Full ROM all peripheral extremities,5/5 strength, and normal gait. Skin: Warm, dry without rashes, lesions, ecchymosis.  Neuro: Cranial  nerves intact, reflexes equal bilaterally. Normal muscle tone, no cerebellar symptoms. Sensation intact to monofilament bilateral lower legs but decreased diffusely to bilateral knees.  Psych: Awake and oriented X 3, normal affect, Insight and Judgment appropriate.   EKG: WNL, no ST changes.     Vicie Mutters 10:28 AM

## 2020-02-14 ENCOUNTER — Encounter: Payer: BC Managed Care – PPO | Admitting: Physician Assistant

## 2020-02-15 ENCOUNTER — Encounter: Payer: Self-pay | Admitting: Physician Assistant

## 2020-02-15 ENCOUNTER — Other Ambulatory Visit: Payer: Self-pay

## 2020-02-15 ENCOUNTER — Ambulatory Visit (INDEPENDENT_AMBULATORY_CARE_PROVIDER_SITE_OTHER): Payer: BC Managed Care – PPO | Admitting: Physician Assistant

## 2020-02-15 VITALS — BP 122/68 | Temp 97.7°F | Ht 61.5 in | Wt 196.8 lb

## 2020-02-15 DIAGNOSIS — I10 Essential (primary) hypertension: Secondary | ICD-10-CM | POA: Diagnosis not present

## 2020-02-15 DIAGNOSIS — Z85528 Personal history of other malignant neoplasm of kidney: Secondary | ICD-10-CM

## 2020-02-15 DIAGNOSIS — Z1329 Encounter for screening for other suspected endocrine disorder: Secondary | ICD-10-CM

## 2020-02-15 DIAGNOSIS — Z86711 Personal history of pulmonary embolism: Secondary | ICD-10-CM

## 2020-02-15 DIAGNOSIS — Z79899 Other long term (current) drug therapy: Secondary | ICD-10-CM | POA: Diagnosis not present

## 2020-02-15 DIAGNOSIS — F419 Anxiety disorder, unspecified: Secondary | ICD-10-CM

## 2020-02-15 DIAGNOSIS — M329 Systemic lupus erythematosus, unspecified: Secondary | ICD-10-CM

## 2020-02-15 DIAGNOSIS — Z0000 Encounter for general adult medical examination without abnormal findings: Secondary | ICD-10-CM

## 2020-02-15 DIAGNOSIS — E785 Hyperlipidemia, unspecified: Secondary | ICD-10-CM

## 2020-02-15 DIAGNOSIS — Z1389 Encounter for screening for other disorder: Secondary | ICD-10-CM | POA: Diagnosis not present

## 2020-02-15 DIAGNOSIS — Z131 Encounter for screening for diabetes mellitus: Secondary | ICD-10-CM | POA: Diagnosis not present

## 2020-02-15 DIAGNOSIS — N816 Rectocele: Secondary | ICD-10-CM

## 2020-02-15 DIAGNOSIS — Z9109 Other allergy status, other than to drugs and biological substances: Secondary | ICD-10-CM

## 2020-02-15 DIAGNOSIS — Z13220 Encounter for screening for lipoid disorders: Secondary | ICD-10-CM | POA: Diagnosis not present

## 2020-02-15 DIAGNOSIS — F3341 Major depressive disorder, recurrent, in partial remission: Secondary | ICD-10-CM

## 2020-02-15 DIAGNOSIS — Z136 Encounter for screening for cardiovascular disorders: Secondary | ICD-10-CM | POA: Diagnosis not present

## 2020-02-15 DIAGNOSIS — R7309 Other abnormal glucose: Secondary | ICD-10-CM

## 2020-02-15 DIAGNOSIS — Z905 Acquired absence of kidney: Secondary | ICD-10-CM

## 2020-02-15 DIAGNOSIS — E559 Vitamin D deficiency, unspecified: Secondary | ICD-10-CM

## 2020-02-15 DIAGNOSIS — K5909 Other constipation: Secondary | ICD-10-CM

## 2020-02-15 DIAGNOSIS — Z0001 Encounter for general adult medical examination with abnormal findings: Secondary | ICD-10-CM

## 2020-02-15 DIAGNOSIS — G4733 Obstructive sleep apnea (adult) (pediatric): Secondary | ICD-10-CM

## 2020-02-15 DIAGNOSIS — R300 Dysuria: Secondary | ICD-10-CM

## 2020-02-15 DIAGNOSIS — E669 Obesity, unspecified: Secondary | ICD-10-CM

## 2020-02-15 DIAGNOSIS — G2581 Restless legs syndrome: Secondary | ICD-10-CM

## 2020-02-15 NOTE — Patient Instructions (Signed)
Are you an emotional eater? Do you eat more when you're feeling stressed? Do you eat when you're not hungry or when you're full? Do you eat to feel better (to calm and soothe yourself when you're sad, mad, bored, anxious, etc.)? Do you reward yourself with food? Do you regularly eat until you've stuffed yourself? Does food make you feel safe? Do you feel like food is a friend? Do you feel powerless or out of control around food?  If you answered yes to some of these questions than it is likely that you are an emotional eater. This is normally a learned behavior and can take time to first recognize the signs and second BREAK THE HABIT. But here is more information and tips to help.   The difference between emotional hunger and physical hunger Emotional hunger can be powerful, so it's easy to mistake it for physical hunger. But there are clues you can look for to help you tell physical and emotional hunger apart.  Emotional hunger comes on suddenly. It hits you in an instant and feels overwhelming and urgent. Physical hunger, on the other hand, comes on more gradually. The urge to eat doesn't feel as dire or demand instant satisfaction (unless you haven't eaten for a very long time).  Emotional hunger craves specific comfort foods. When you're physically hungry, almost anything sounds good--including healthy stuff like vegetables. But emotional hunger craves junk food or sugary snacks that provide an instant rush. You feel like you need cheesecake or pizza, and nothing else will do.  Emotional hunger often leads to mindless eating. Before you know it, you've eaten a whole bag of chips or an entire pint of ice cream without really paying attention or fully enjoying it. When you're eating in response to physical hunger, you're typically more aware of what you're doing.  Emotional hunger isn't satisfied once you're full. You keep wanting more and more, often eating until you're uncomfortably stuffed.  Physical hunger, on the other hand, doesn't need to be stuffed. You feel satisfied when your stomach is full.  Emotional hunger isn't located in the stomach. Rather than a growling belly or a pang in your stomach, you feel your hunger as a craving you can't get out of your head. You're focused on specific textures, tastes, and smells.  Emotional hunger often leads to regret, guilt, or shame. When you eat to satisfy physical hunger, you're unlikely to feel guilty or ashamed because you're simply giving your body what it needs. If you feel guilty after you eat, it's likely because you know deep down that you're not eating for nutritional reasons.  Identify your emotional eating triggers What situations, places, or feelings make you reach for the comfort of food? Most emotional eating is linked to unpleasant feelings, but it can also be triggered by positive emotions, such as rewarding yourself for achieving a goal or celebrating a holiday or happy event. Common causes of emotional eating include:  Stuffing emotions - Eating can be a way to temporarily silence or "stuff down" uncomfortable emotions, including anger, fear, sadness, anxiety, loneliness, resentment, and shame. While you're numbing yourself with food, you can avoid the difficult emotions you'd rather not feel.  Boredom or feelings of emptiness - Do you ever eat simply to give yourself something to do, to relieve boredom, or as a way to fill a void in your life? You feel unfulfilled and empty, and food is a way to occupy your mouth and your time. In the moment,  it fills you up and distracts you from underlying feelings of purposelessness and dissatisfaction with your life.  Childhood habits - Think back to your childhood memories of food. Did your parents reward good behavior with ice cream, take you out for pizza when you got a good report card, or serve you sweets when you were feeling sad? These habits can often carry over into adulthood. Or  your eating may be driven by nostalgia--for cherished memories of grilling burgers in the backyard with your dad or baking and eating cookies with your mom.  Social influences - Getting together with other people for a meal is a great way to relieve stress, but it can also lead to overeating. It's easy to overindulge simply because the food is there or because everyone else is eating. You may also overeat in social situations out of nervousness. Or perhaps your family or circle of friends encourages you to overeat, and it's easier to go along with the group.  Stress - Ever notice how stress makes you hungry? It's not just in your mind. When stress is chronic, as it so often is in our chaotic, fast-paced world, your body produces high levels of the stress hormone, cortisol. Cortisol triggers cravings for salty, sweet, and fried foods--foods that give you a burst of energy and pleasure. The more uncontrolled stress in your life, the more likely you are to turn to food for emotional relief.  Find other ways to feed your feelings If you don't know how to manage your emotions in a way that doesn't involve food, you won't be able to control your eating habits for very long. Diets so often fail because they offer logical nutritional advice which only works if you have conscious control over your eating habits. It doesn't work when emotions hijack the process, demanding an immediate payoff with food.  In order to stop emotional eating, you have to find other ways to fulfill yourself emotionally. It's not enough to understand the cycle of emotional eating or even to understand your triggers, although that's a huge first step. You need alternatives to food that you can turn to for emotional fulfillment.  Alternatives to emotional eating If you're depressed or lonely, call someone who always makes you feel better, play with your dog or cat, or look at a favorite photo or cherished memento.  If you're anxious,  expend your nervous energy by dancing to your favorite song, squeezing a stress ball, or taking a brisk walk.  If you're exhausted, treat yourself with a hot cup of tea, take a bath, light some scented candles, or wrap yourself in a warm blanket.  If you're bored, read a good book, watch a comedy show, explore the outdoors, or turn to an activity you enjoy (woodworking, playing the guitar, shooting hoops, scrapbooking, etc.).  What is mindful eating? Mindful eating is a practice that develops your awareness of eating habits and allows you to pause between your triggers and your actions. Most emotional eaters feel powerless over their food cravings. When the urge to eat hits, you feel an almost unbearable tension that demands to be fed, right now. Because you've tried to resist in the past and failed, you believe that your willpower just isn't up to snuff. But the truth is that you have more power over your cravings than you think.  Take 5 before you give in to a craving Emotional eating tends to be automatic and virtually mindless. Before you even realize what you're doing, you've  reached for a tub of ice cream and polished off half of it. But if you can take a moment to pause and reflect when you're hit with a craving, you give yourself the opportunity to make a different decision.  Can you put off eating for five minutes? Or just start with one minute. Don't tell yourself you can't give in to the craving; remember, the forbidden is extremely tempting. Just tell yourself to wait.  While you're waiting, check in with yourself. How are you feeling? What's going on emotionally? Even if you end up eating, you'll have a better understanding of why you did it. This can help you set yourself up for a different response next time.  How to practice mindful eating Eating while you're also doing other things--such as watching TV, driving, or playing with your phone--can prevent you from fully enjoying your  food. Since your mind is elsewhere, you may not feel satisfied or continue eating even though you're no longer hungry. Eating more mindfully can help focus your mind on your food and the pleasure of a meal and curb overeating.   Eat your meals in a calm place with no distractions, aside from any dining companions.  Try eating with your non-dominant hand or using chopsticks instead of a knife and fork. Eating in such a non-familiar way can slow down how fast you eat and ensure your mind stays focused on your food.  Allow yourself enough time not to have to rush your meal. Set a timer for 20 minutes and pace yourself so you spend at least that much time eating.  Take small bites and chew them well, taking time to notice the different flavors and textures of each mouthful.  Put your utensils down between bites. Take time to consider how you feel--hungry, satiated--before picking up your utensils again.  Try to stop eating before you are full.It takes time for the signal to reach your brain that you've had enough. Don't feel obligated to always clean your plate.  When you've finished your food, take a few moments to assess if you're really still hungry before opting for an extra serving or dessert.  Learn to accept your feelings--even the bad ones  While it may seem that the core problem is that you're powerless over food, emotional eating actually stems from feeling powerless over your emotions. You don't feel capable of dealing with your feelings head on, so you avoid them with food.  Recommended reading  Mini Habits for weight loss  Healthy Eating: A guide to the new nutrition - [place] [employer]  10 Tips for Mindful Eating - How mindfulness can help you fully enjoy a meal and the experience of eating--with moderation and restraint. ([place])  Weight Loss: Gain Control of Emotional Eating - Tips to regain control of your eating habits. [facility])  Why Stress Causes People to Overeat -Tips on controlling stress eating. ([place])  Mindful Eating Meditations -Free online mindfulness meditations. ([facility] [employer])     General eating tips  What to Avoid . Avoid added sugars o Often added sugar can be found in processed foods such as many condiments, dry cereals, cakes, cookies, chips, crisps, crackers, candies, sweetened drinks, etc.  o Read labels and AVOID/DECREASE use of foods with the following in their ingredient list: Sugar, fructose, high fructose corn syrup, sucrose, glucose, maltose, dextrose, molasses, cane sugar, brown sugar, any type of syrup, agave nectar, etc.   .  Avoid snacking in between meals- drink water or if you feel you need a snack, pick a high water content snack such as cucumbers, watermelon, or any veggie.  Marland Kitchen Avoid foods made with flour o If you are going to eat food made with flour, choose those made with whole-grains; and, minimize your consumption as much as is tolerable . Avoid processed foods o These foods are generally stocked in the middle of the grocery store.  o Focus on shopping on the perimeter of the grocery.  What to Include . Vegetables o GREEN LEAFY VEGETABLES: Kale, spinach, mustard greens, collard greens, cabbage, broccoli, etc. o OTHER: Asparagus, cauliflower, eggplant, carrots, peas, Brussel sprouts, tomatoes, bell peppers, zucchini, beets, cucumbers, etc. . Grains, seeds, and legumes o Beans: kidney beans, black eyed peas, garbanzo beans, black beans, pinto beans, etc. o Whole, unrefined grains: brown rice, barley, bulgur, oatmeal, etc. . Healthy fats  o Avoid highly processed fats such as vegetable oil o Examples of healthy fats: avocado, olives, virgin olive oil, dark chocolate (?72% Cocoa), nuts (peanuts, almonds, walnuts, cashews, pecans, etc.) o Please still do small amount of these healthy fats, they are dense in calories.  . Low - Moderate  Intake of Animal Sources of Protein o Meat sources: chicken, [place], salmon, tuna. Limit to 4 ounces of meat at one time or the size of your palm. o Consider limiting dairy sources, but when choosing dairy focus on: PLAIN [place] yogurt, cottage cheese, high-protein milk . Fruit o Choose berries

## 2020-02-16 LAB — CBC WITH DIFFERENTIAL/PLATELET
Absolute Monocytes: 434 cells/uL (ref 200–950)
Basophils Absolute: 21 cells/uL (ref 0–200)
Basophils Relative: 0.6 %
Eosinophils Absolute: 91 cells/uL (ref 15–500)
Eosinophils Relative: 2.6 %
HCT: 40.8 % (ref 35.0–45.0)
Hemoglobin: 13.6 g/dL (ref 11.7–15.5)
Lymphs Abs: 1645 cells/uL (ref 850–3900)
MCH: 30.1 pg (ref 27.0–33.0)
MCHC: 33.3 g/dL (ref 32.0–36.0)
MCV: 90.3 fL (ref 80.0–100.0)
MPV: 9.2 fL (ref 7.5–12.5)
Monocytes Relative: 12.4 %
Neutro Abs: 1309 cells/uL — ABNORMAL LOW (ref 1500–7800)
Neutrophils Relative %: 37.4 %
Platelets: 261 10*3/uL (ref 140–400)
RBC: 4.52 10*6/uL (ref 3.80–5.10)
RDW: 12.9 % (ref 11.0–15.0)
Total Lymphocyte: 47 %
WBC: 3.5 10*3/uL — ABNORMAL LOW (ref 3.8–10.8)

## 2020-02-16 LAB — MICROALBUMIN / CREATININE URINE RATIO
Creatinine, Urine: 74 mg/dL (ref 20–275)
Microalb Creat Ratio: 3 mcg/mg creat (ref <30)
Microalb, Ur: 0.2 mg/dL

## 2020-02-16 LAB — LIPID PANEL
Cholesterol: 178 mg/dL (ref <200)
HDL: 37 mg/dL — ABNORMAL LOW (ref >=50)
LDL Cholesterol (Calc): 124 mg/dL (calc) — ABNORMAL HIGH
Non-HDL Cholesterol (Calc): 141 mg/dL (calc) — ABNORMAL HIGH (ref <130)
Total CHOL/HDL Ratio: 4.8 (calc) (ref <5.0)
Triglycerides: 77 mg/dL (ref <150)

## 2020-02-16 LAB — URINALYSIS, ROUTINE W REFLEX MICROSCOPIC
Bilirubin Urine: NEGATIVE
Glucose, UA: NEGATIVE
Hgb urine dipstick: NEGATIVE
Hyaline Cast: NONE SEEN /LPF
Ketones, ur: NEGATIVE
Nitrite: POSITIVE — AB
Protein, ur: NEGATIVE
RBC / HPF: NONE SEEN /HPF (ref 0–2)
Specific Gravity, Urine: 1.013 (ref 1.001–1.03)
Squamous Epithelial / HPF: NONE SEEN /HPF (ref <=5)
pH: 5.5 (ref 5.0–8.0)

## 2020-02-16 LAB — COMPLETE METABOLIC PANEL WITH GFR
AG Ratio: 1.1 (calc) (ref 1.0–2.5)
ALT: 12 U/L (ref 6–29)
AST: 16 U/L (ref 10–35)
Albumin: 3.9 g/dL (ref 3.6–5.1)
Alkaline phosphatase (APISO): 50 U/L (ref 37–153)
BUN/Creatinine Ratio: 18 (calc) (ref 6–22)
BUN: 19 mg/dL (ref 7–25)
CO2: 25 mmol/L (ref 20–32)
Calcium: 9.2 mg/dL (ref 8.6–10.4)
Chloride: 108 mmol/L (ref 98–110)
Creat: 1.08 mg/dL — ABNORMAL HIGH (ref 0.50–0.99)
GFR, Est African American: 63 mL/min/{1.73_m2} (ref >=60)
GFR, Est Non African American: 54 mL/min/{1.73_m2} — ABNORMAL LOW (ref >=60)
Globulin: 3.4 g/dL (calc) (ref 1.9–3.7)
Glucose, Bld: 83 mg/dL (ref 65–99)
Potassium: 4.4 mmol/L (ref 3.5–5.3)
Sodium: 138 mmol/L (ref 135–146)
Total Bilirubin: 0.4 mg/dL (ref 0.2–1.2)
Total Protein: 7.3 g/dL (ref 6.1–8.1)

## 2020-02-16 LAB — HEMOGLOBIN A1C
Hgb A1c MFr Bld: 6 % of total Hgb — ABNORMAL HIGH (ref <5.7)
Mean Plasma Glucose: 126 (calc)
eAG (mmol/L): 7 (calc)

## 2020-02-16 LAB — MAGNESIUM: Magnesium: 1.8 mg/dL (ref 1.5–2.5)

## 2020-02-16 LAB — TSH: TSH: 1.28 mIU/L (ref 0.40–4.50)

## 2020-02-18 ENCOUNTER — Other Ambulatory Visit: Payer: Self-pay

## 2020-02-18 MED ORDER — CEPHALEXIN 500 MG PO CAPS: 500.0000 mg | ORAL_CAPSULE | Freq: Four times a day (QID) | ORAL | 0 refills | Status: AC

## 2020-02-18 NOTE — Addendum Note (Signed)
Addended by: Vicie Mutters R on: 02/18/2020 08:10 AM   Modules accepted: Orders

## 2020-02-19 MED ORDER — ALPRAZOLAM 0.5 MG PO TABS: ORAL_TABLET | ORAL | 0 refills | Status: DC

## 2020-02-28 ENCOUNTER — Other Ambulatory Visit: Payer: Self-pay

## 2020-02-28 ENCOUNTER — Ambulatory Visit
Admission: RE | Admit: 2020-02-28 | Discharge: 2020-02-28 | Disposition: A | Discharge status: Home / Self Care | Payer: BC Managed Care – PPO | Source: Ambulatory Visit | Attending: Internal Medicine | Admitting: Internal Medicine

## 2020-02-28 DIAGNOSIS — Z1231 Encounter for screening mammogram for malignant neoplasm of breast: Secondary | ICD-10-CM

## 2020-03-04 ENCOUNTER — Other Ambulatory Visit: Payer: Self-pay | Admitting: Internal Medicine

## 2020-03-04 DIAGNOSIS — R928 Other abnormal and inconclusive findings on diagnostic imaging of breast: Secondary | ICD-10-CM

## 2020-03-14 ENCOUNTER — Ambulatory Visit: Payer: BC Managed Care – PPO

## 2020-03-14 ENCOUNTER — Other Ambulatory Visit: Payer: Self-pay

## 2020-03-14 ENCOUNTER — Ambulatory Visit
Admission: RE | Admit: 2020-03-14 | Discharge: 2020-03-14 | Disposition: A | Discharge status: Home / Self Care | Payer: BC Managed Care – PPO | Source: Ambulatory Visit | Attending: Internal Medicine | Admitting: Internal Medicine

## 2020-03-14 DIAGNOSIS — R928 Other abnormal and inconclusive findings on diagnostic imaging of breast: Secondary | ICD-10-CM

## 2020-03-26 ENCOUNTER — Other Ambulatory Visit: Payer: Self-pay | Admitting: Adult Health

## 2020-03-26 ENCOUNTER — Ambulatory Visit: Payer: BC Managed Care – PPO | Attending: Family

## 2020-03-26 DIAGNOSIS — Z23 Encounter for immunization: Secondary | ICD-10-CM

## 2020-03-30 ENCOUNTER — Other Ambulatory Visit: Payer: Self-pay | Admitting: Internal Medicine

## 2020-04-02 ENCOUNTER — Other Ambulatory Visit (HOSPITAL_COMMUNITY): Payer: Self-pay | Admitting: Adult Health

## 2020-04-02 ENCOUNTER — Other Ambulatory Visit: Payer: Self-pay

## 2020-04-02 ENCOUNTER — Ambulatory Visit (HOSPITAL_COMMUNITY)
Admission: RE | Admit: 2020-04-02 | Discharge: 2020-04-02 | Disposition: A | Discharge status: Home / Self Care | Payer: BC Managed Care – PPO | Source: Ambulatory Visit | Attending: Adult Health | Admitting: Adult Health

## 2020-04-02 DIAGNOSIS — Z85528 Personal history of other malignant neoplasm of kidney: Secondary | ICD-10-CM

## 2020-04-07 ENCOUNTER — Other Ambulatory Visit: Payer: Self-pay | Admitting: Adult Health Nurse Practitioner

## 2020-05-03 ENCOUNTER — Other Ambulatory Visit: Payer: Self-pay | Admitting: Adult Health

## 2020-05-14 ENCOUNTER — Other Ambulatory Visit: Payer: Self-pay | Admitting: Adult Health Nurse Practitioner

## 2020-05-21 ENCOUNTER — Ambulatory Visit: Payer: BC Managed Care – PPO | Admitting: Adult Health Nurse Practitioner

## 2020-05-21 NOTE — Progress Notes (Deleted)
Complete Physical  Assessment and Plan:  Chronic constipation Continue linzess.   Polyneuropathy Bilateral decreased sensation to monofilament test, no muscular weakness.  Some bilateral hip weakness/pain with standing long time.  Normal B12, normal RPR, HIV.  May need EMG studies Normal MRI lumbar in 2007 Increase exercise, follow up neuro, will monitor  Supraclavicular fossa fullness -     US Soft Tissue Head/Neck; Future  Essential hypertension - continue medications, DASH diet, exercise and monitor at home. Call if greater than 130/80.  -     CBC with Differential/Platelet -     COMPLETE METABOLIC PANEL WITH GFR -     TSH -     Urinalysis, Routine w reflex microscopic -     Microalbumin / creatinine urine ratio  OSA (obstructive sleep apnea) Sleep apnea- continue CPAP, CPAP is helping with daytime fatigue, weight loss still advised.   Renal cell carcinoma, unspecified laterality (HCC) S/p nephrectomy and doing well  Systemic lupus erythematosus, unspecified SLE type, unspecified organ involvement status ([place]) Continue meds and follow up Encourage follow up, has not been seen in 3 years.  Having some fatigue, leg pain, rule out association with lupus  Abnormal glucose Discussed disease progression and risks Discussed diet/exercise, weight management and risk modification -     Hemoglobin A1c  Hyperlipidemia, unspecified hyperlipidemia type check lipids decrease fatty foods increase activity.  -     Lipid panel  Vitamin D deficiency -     VITAMIN D 25 Hydroxy (Vit-D Deficiency, Fractures)  Obesity (BMI 30.0-34.9) - follow up 3 months for progress monitoring - increase veggies, decrease carbs - stop phentermine, not helping with weight loss.  - long discussion about weight loss, diet, and exercise  Medication management -     Magnesium  RLS (restless legs syndrome) Increase walking  H/O left nephrectomy Well healed and doing well Has very small  ventral hernia, has follow up tomorrow.   Recurrent major depressive disorder, in partial remission ([place]) - continue medications, stress management techniques discussed, increase water, good sleep hygiene discussed, increase exercise, and increase veggies.   Anxiety Continue meds, will try to decrease xanax use  History of pulmonary embolus (PE) Provoked, off anticoagulant at this time though patient reminds high risk with lupus, will monitor closely.   Rectocele She is following with urology, Dr. Marney Doctor Doing pelvic floor PT for that.   Discussed med's effects and SE's. Screening labs and tests as requested with regular follow-up as recommended. Future Appointments  Date Time Provider [place]  05/21/2020  8:45 AM Garnet Sierras, NP [facility] None  09/25/2020  8:00 AM Ubaldo Glassing, Amy, NP [facility] None  02/17/2021  9:00 AM Cade Dashner, Danton Sewer, NP [facility] None    HPI Patient presents for complete physical.    She has rectocele, doing pelvic floor PT, states this is not helping. She is following with urology. Has followed with Dr. Zigmund Daniel. Zanaflex states this has helped some.   She has SLE and follows with [name], on plaquenil. Has OV in Nov with him.   Lab Results  Component Value Date   [id number] 10/16/2019  HIV negative, neg RPR 2016.   BMI is There is no height or weight on file to calculate BMI., she is working on diet and exercise.She is on phentermine but has not lost weight, states it is helping with energy but not with weight loss. Will stop.  Wt Readings from Last 3 Encounters:  02/15/20 196 lb 12.8 oz (89.3 kg)  01/03/20  195 lb (88.5 kg)  11/26/19 193 lb (87.5 kg)   She underwent left laparoscopic nephrectomy by Dr. Link Snuffer on 09/19/2017 for left renal mass and carcinoma, had subsequent provoked PE.  Currrently not on a medication.  She has depression and anxiety, was following with [name].  She is feeling that she is doing better with her  anxiety.    Patient denies chest pain, shortness of breath, dizziness.     Patient's cholesterol is diet controlled. The patient's cholesterol last visit was  Lab Results  Component Value Date   CHOL 178 02/15/2020   HDL 37 (L) 02/15/2020   LDLCALC 124 (H) 02/15/2020   TRIG 77 02/15/2020   CHOLHDL 4.8 02/15/2020   The patient has been working on diet and exercise for prediabetes, denies changes in vision, polys, and paresthesias. Last A1C in office was Lab Results  Component Value Date   HGBA1C 6.0 (H) 02/15/2020   Vitamin D deficiency, on vitamin d Lab Results  Component Value Date   VD25OH 1 10/16/2019     Current Medications:      Current Outpatient Medications (Analgesics):  .  acetaminophen (TYLENOL) 500 MG tablet, Take 1,000 mg by mouth every 6 (six) hours as needed for mild pain.   Current Outpatient Medications (Other):  Marland Kitchen  ALPRAZolam (XANAX) 0.5 MG tablet, TAKE 1/2 TO 1 TABLET BY MOUTH 2 X PER DAY, LIMIT TO 5 DAYS A WEEK TO AVOID TOLERANCE AND ADDICTION. SHOULD LAST LONGER THAN A MONTH. Marland Kitchen  buPROPion (WELLBUTRIN XL) 150 MG 24 hr tablet, Take       1 tablet        every Morning         for Mood, Focus & Concentration .  Cholecalciferol (VITAMIN D) 2000 UNITS CAPS, Take 1 capsule by mouth daily. Marland Kitchen  escitalopram (LEXAPRO) 20 MG tablet, Take 1.5 tablets (30 mg total) by mouth daily. TAKE ONE AND A HALF TABLETS (30MG ) DAILY FOR MOOD & ANXIETY. .  hydroxychloroquine (PLAQUENIL) 200 MG tablet, Take 1 tablet 2 x /day for Arthritis (Patient taking differently: Take 400 mg by mouth daily. Take 1 tablet 2 x /day for Arthritis) .  linaclotide (LINZESS) 290 MCG CAPS capsule, Take 290 mcg by mouth daily as needed (constipation).  .  Magnesium 500 MG TABS, Take 500 mg by mouth daily.  .  Probiotic Product (PROBIOTIC DAILY PO), Take by mouth daily.  Health Maintenance:   Immunization History  Administered Date(s) Administered  . Moderna Sars-Covid-2 Vaccination 07/26/2019,  08/28/2019  . Pneumococcal Polysaccharide-23 01/07/2016  . Td 05/19/2003  . Tdap 05/23/2013   Health Maintenance  Topic Date Due  . PAP SMEAR-Modifier  07/05/2019  . COVID-19 Vaccine (3 - Moderna risk 4-dose series) 09/25/2019  . MAMMOGRAM  02/27/2022  . TETANUS/TDAP  05/24/2023  . COLONOSCOPY (Pts 45-41yrs Insurance coverage will need to be confirmed)  11/07/2023  . Hepatitis C Screening  Completed  . HIV Screening  Completed  . INFLUENZA VACCINE  Discontinued    TDAP: 2015 Pneumovax: 2017 Flu vaccine: declines Prevnar: N/A Zostavax: N/A  Pap: 2016 neg Boyd/Richardson, NP MGM: 02/2020- category C- neg DEXA: 2007 normal Colonoscopy: Dr. Alice Reichert in [place] 10/2013 EGD:N/A CT head 2005 CXR 2005 MRI Lumbar spine 2007 Echo 2005 Sleep study 2020  Patient Care Team: [name], MD as PCP - General (Internal Medicine) Signe Colt, MD as Referring Physician (Obstetrics and Gynecology) Kristeen Miss, MD as Consulting Physician (Neurosurgery) Hennie Duos, MD as  Consulting Physician (Rheumatology) Stanton Kidney, MD as Referring Physician (Internal Medicine) Nadyne Coombes, MD as Consulting Physician (Ophthalmology) Alfredo Martinez, MD as Consulting Physician (Urology)  Medical History:  Past Medical History:  Diagnosis Date  . Anxiety   . Chronic constipation    followed by dr Leone Payor  . CKD (chronic kidney disease), stage III (HCC)   . Enterocele   . History of cervical cancer    hx squamous cell cervical carcinoma,   s/p  radical hysterectomy 11-24-1999  . History of MRSA infection    2005--- buttock abscess  . History of pulmonary embolus (PE)    post op 10-09-2017 (surgery on 09-19-2017)  lower range pulmonary artery,  resolved and completed 6 month Eliquis  . History of renal cell carcinoma    s/p  left nephrectomy 09-19-2017  (oncologist -- dr Clelia Croft / urologsit-- dr bell)  . 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 Incisional hernia   . MDD (major depressive disorder)   . OA (osteoarthritis)   . OSA on CPAP    followed by dr dohmeier  . Pre-diabetes   . Rectocele   . RLS (restless legs syndrome) 01/24/2015   Per sleep study 05/2014   . Solitary right kidney    acquired 09-19-2017 s/p left nephrectomy  . SUI (stress urinary incontinence, female)    05-21-2019  improved with therapy  . Systemic lupus erythematosus (HCC)    rheumotologist--- dr Dierdre Forth (lov 03-19-2019 in epic)  . Vitamin D deficiency   . Wears glasses    Allergies Allergies  Allergen Reactions  . Bactrim [Sulfamethoxazole-Trimethoprim]     GI upset  . Cymbalta [Duloxetine Hcl]     Sweating  . Acyclovir And Related Itching    SURGICAL HISTORY She  has a past surgical history that includes Cesarean section (x3   last one 1980); Laparoscopic nephrectomy, hand assisted (Left, 09/19/2017); Colonoscopy; EXPLORATORY LAPAROTOMY MODIFIED RADICAL HYSTERECTOMY WITH PELVIC LYMPHADENECTOMY AND RESECTION MECKEL'S DIVERTICULUM (11-24-1999 dr Loree Fee  @[facility] ); COLD KNIFE CERVICAL CONIZATION  (10/07/1999  @[facility] ); and Incisional hernia repair (N/A, 05/23/2019). FAMILY HISTORY Her family history includes Heart disease in her mother; Hyperlipidemia in her mother; Hypertension in her mother. SOCIAL HISTORY She  reports that she has never smoked. She has never used smokeless tobacco. She reports that she does not drink alcohol and does not use drugs.  Review of Systems  Constitutional: Negative.  Negative for chills and fever.  HENT: Negative.   Eyes: Negative.   Respiratory: Negative.   Cardiovascular: Negative.   Gastrointestinal: Positive for constipation (better with linzess). Negative for abdominal pain, blood in stool, diarrhea, heartburn, melena, nausea and vomiting.  Genitourinary: Positive  for frequency and urgency.  Musculoskeletal: Positive for back pain (bilateral lower bacck, tired feeling in bilateral lower hips) and myalgias. Negative for falls, joint pain and neck pain.  Skin: Negative.  Negative for rash.  Neurological: Positive for tingling (bilateral legs). Negative for dizziness, tremors and headaches.  Endo/Heme/Allergies: Negative.   Psychiatric/Behavioral: Negative for depression and memory loss. The patient is not nervous/anxious and does not have insomnia.     Physical Exam: Estimated body mass index is 36.58 kg/m as calculated from the following:   Height as of 02/15/20: 5' 1.5" (1.562 m).   Weight as of 02/15/20: 196 lb 12.8  oz (89.3 kg). There were no vitals filed for this visit. General Appearance: Well nourished, in no apparent distress. Eyes: PERRLA, EOMs, conjunctiva no swelling or erythema, normal fundi and vessels. Sinuses: No Frontal/maxillary tenderness ENT/Mouth: Ext aud canals clear, normal light reflex with TMs without erythema, bulging.  Good dentition. No erythema, swelling, or exudate on post pharynx. Tonsils not swollen or erythematous. Hearing normal. Crowded mouth.  Neck: Supple, thyroid normal. No bruits Respiratory: Respiratory effort normal, BS equal bilaterally without rales, rhonchi, wheezing or stridor. Cardio: RRR without murmurs, rubs or gallops. Brisk peripheral pulses without edema.  Chest: symmetric, with normal excursions and percussion. Breasts: breasts appear normal, no suspicious masses, no skin or nipple changes or axillary nodes. Abdomen: Soft, obese +BS. Non tender, no guarding, rebound, masses, or organomegaly. Very small ventral hernia along well healed vertical abdominal scar, easily retractile.  Lymphatics: Left supraclavicular fullness on the left with mobile nontender nodule. Non tender without lymphadenopathy.  Genitourinary: defer Musculoskeletal: Full ROM all peripheral extremities,5/5 strength, and normal  gait. Skin: Warm, dry without rashes, lesions, ecchymosis.  Neuro: Cranial nerves intact, reflexes equal bilaterally. Normal muscle tone, no cerebellar symptoms. Sensation intact to monofilament bilateral lower legs but decreased diffusely to bilateral knees.  Psych: Awake and oriented X 3, normal affect, Insight and Judgment appropriate.   EKG: WNL, no ST changes.     Coden Franchi 8:16 AM

## 2020-05-26 NOTE — Progress Notes (Signed)
FOLLOW UP 3 MONTHS  Assessment and Plan:   Essential hypertension No medications: Monitor blood pressure at home; call if consistently over 130/80 Continue DASH diet.   Reminder to go to the ER if any CP, SOB, nausea, dizziness, severe HA, changes vision/speech, left arm numbness and tingling and jaw pain.   Hyperlipidemia, unspecified hyperlipidemia type No medications decrease fatty foods increase activity.  -     Lipid panel   Polyneuropathy Bilateral decreased sensation to monofilament test, no muscular weakness.  Some bilateral hip weakness/pain with standing long time.  Normal B12, normal RPR, HIV.  May need EMG studies? Normal MRI lumbar in 2007 Rx Lumbar spine rule out Increase exercise, follow up neuro, will monitor  Systemic lupus erythematosus, unspecified SLE type, unspecified organ involvement status ([place]) Continue meds and follow up Encourage follow up,  Having some fatigue, leg pain, rule out association with lupus next OV 2/22  Chronic constipation Continue linzess, discussed use of this  OSA (obstructive sleep apnea) Sleep apnea- continue CPAP, CPAP is helping with daytime fatigue, weight loss still advised.   Renal cell carcinoma, unspecified laterality ([place]) S/p nephrectomy and doing well  Abnormal glucose Discussed disease progression and risks Discussed diet/exercise, weight management and risk modification -     Hemoglobin A1c  Vitamin D deficiency -     VITAMIN D 25 Hydroxy (Vit-D Deficiency, Fractures)  Obesity (BMI 30.0-34.9) - follow up 3 months for progress monitoring - increase veggies, decrease carbs - stop phentermine, not helping with weight loss.  - long discussion about weight loss, diet, and exercise  Medication management -     Magnesium  RLS (restless legs syndrome) Encouraged Increase walking  Recurrent major depressive disorder, in partial remission ([place]) - continue medications, stress management techniques  discussed, increase water, good sleep hygiene discussed, increase exercise, and increase veggies.   Anxiety Continue meds, will try to decrease xanax use  History of pulmonary embolus (PE) Provoked, off anticoagulant at this time though patient reminds high risk with lupus, will monitor closely.   Rectocele She is following with urology, Dr. Marney Doctor Doing pelvic floor PT for that.   Abnormal glucose Discussed dietary and exercise modifications   Vitamin D deficiency Continue supplementation to maintain goal of 70-100 Taking Vitamin D  IU daily Defer vitamin D level  SUI (stress urinary incontinence, female) Discussed dietary and exercise modifications  Morbid obesity ([facility]) Discussed dietary and exercise modifications  Medication Management Contniued  Pending multiple follow ups.  Further disposition pending results if labs check today. Discussed med's effects and SE's.   Over 30 minutes of face to face interview, exam, counseling, chart review, and critical decision making was performed.    Future Appointments  Date Time Provider [place]  08/26/2020 11:30 AM Garnet Sierras, NP [facility] None  09/25/2020  8:00 AM Lomax, Amy, NP [facility] None  02/17/2021  9:00 AM Norine Reddington, Danton Sewer, NP [facility] None    HPI Patient presents for follow up, 3 month.  She has rectocele, doing pelvic floor PT, states this is not helping. She was following with urology. [facility]/pressure.  She has an appointment 07/13/20.  She has SLE and follows with [name], on plaquenil. Has OV in 11/21 and next OV is 2/22  Stiffness is from the waist down and along with some edema.  She reports her shoes feel tight. She works as a Network engineer and reports that when she gets up she feels stiffness that is  allover, generalized.  She reports it does improve when she gets up and gets moving.  Reports she will take a tylenol, 2-3 to help  with the pain.      Report she drink 8-9 glasses of water a day and she drinks this regularly and stops around 7pm.  She is taking Bertram extract to help but then she started having vertigo.  This stopped once she stopped the supplement.      Lab Results  Component Value Date   [id number] 10/16/2019  HIV negative, neg RPR 2016.   BMI is Body mass index is 37.92 kg/m., she is working on diet and exercise.She is on phentermine but has not lost weight, states it is helping with energy but not with weight loss. Will stop.  Wt Readings from Last 3 Encounters:  05/27/20 204 lb (92.5 kg)  02/15/20 196 lb 12.8 oz (89.3 kg)  01/03/20 195 lb (88.5 kg)   She underwent left laparoscopic nephrectomy by Dr. Link Snuffer on 09/19/2017 for left renal mass and carcinoma, had subsequent provoked PE.  Currrently not on a medication.  She has depression and anxiety, was following with [name].  She is feeling that she is doing better with her anxiety.    Patient denies chest pain, shortness of breath, dizziness. BP: 128/88   Patient's cholesterol is diet controlled. The patient's cholesterol last visit was  Lab Results  Component Value Date   CHOL 178 02/15/2020   HDL 37 (L) 02/15/2020   LDLCALC 124 (H) 02/15/2020   TRIG 77 02/15/2020   CHOLHDL 4.8 02/15/2020   The patient has been working on diet and exercise for prediabetes, denies changes in vision, polys, and paresthesias. Last A1C in office was Lab Results  Component Value Date   HGBA1C 6.0 (H) 02/15/2020   Vitamin D deficiency, on vitamin d Lab Results  Component Value Date   VD25OH 30 10/16/2019     Current Medications:      Current Outpatient Medications (Analgesics):  .  acetaminophen (TYLENOL) 500 MG tablet, Take 1,000 mg by mouth every 6 (six) hours as needed for mild pain.   Current Outpatient Medications (Other):  Marland Kitchen  ALPRAZolam (XANAX) 0.5 MG tablet, TAKE 1/2 TO 1 TABLET BY MOUTH 2 X PER DAY,  LIMIT TO 5 DAYS A WEEK TO AVOID TOLERANCE AND ADDICTION. SHOULD LAST LONGER THAN A MONTH. Marland Kitchen  buPROPion (WELLBUTRIN XL) 150 MG 24 hr tablet, Take       1 tablet        every Morning         for Mood, Focus & Concentration .  Cholecalciferol (VITAMIN D) 2000 UNITS CAPS, Take 1 capsule by mouth daily. Marland Kitchen  escitalopram (LEXAPRO) 20 MG tablet, Take 1.5 tablets (30 mg total) by mouth daily. TAKE ONE AND A HALF TABLETS (30MG ) DAILY FOR MOOD & ANXIETY. .  hydroxychloroquine (PLAQUENIL) 200 MG tablet, Take 1 tablet 2 x /day for Arthritis (Patient taking differently: Take 400 mg by mouth daily. Take 1 tablet 2 x /day for Arthritis) .  linaclotide (LINZESS) 290 MCG CAPS capsule, Take 290 mcg by mouth daily as needed (constipation).  .  Magnesium 500 MG TABS, Take 500 mg by mouth daily.   Health Maintenance:   Immunization History  Administered Date(s) Administered  . Moderna Sars-Covid-2 Vaccination 07/26/2019, 08/28/2019  . Pneumococcal Polysaccharide-23 01/07/2016  . Td 05/19/2003  . Tdap 05/23/2013   Health Maintenance  Topic Date Due  .  PAP SMEAR-Modifier  07/05/2019  . COVID-19 Vaccine (3 - Moderna risk 4-dose series) 09/25/2019  . MAMMOGRAM  02/27/2022  . TETANUS/TDAP  05/24/2023  . COLONOSCOPY (Pts 45-43yrs Insurance coverage will need to be confirmed)  11/07/2023  . Hepatitis C Screening  Completed  . HIV Screening  Completed  . INFLUENZA VACCINE  Discontinued    TDAP: 2015 Pneumovax: 2017 Flu vaccine: declines Prevnar: N/A Zostavax: N/A  Pap: 2016 neg Boyd/Richardson, NP MGM: 02/2020- category C- neg DEXA: 2007 normal Colonoscopy: Dr. Alice Reichert in [place] 10/2013 EGD:N/A CT head 2005 CXR 2005 MRI Lumbar spine 2007 Echo 2005 Sleep study 2020  Patient Care Team: [name], MD as PCP - General (Internal Medicine) Signe Colt, MD as Referring Physician (Obstetrics and Gynecology) Kristeen Miss, MD as Consulting Physician (Neurosurgery) Hennie Duos, MD as  Consulting Physician (Rheumatology) Efrain Sella, MD as Referring Physician (Internal Medicine) Ara Kussmaul, MD as Consulting Physician (Ophthalmology) Bjorn Loser, MD as Consulting Physician (Urology)  Medical History:  Past Medical History:  Diagnosis Date  . Anxiety   . Chronic constipation    followed by dr Carlean Purl  . CKD (chronic kidney disease), stage III ([place])   . Enterocele   . History of cervical cancer    hx squamous cell cervical carcinoma,   s/p  radical hysterectomy 11-24-1999  . History of MRSA infection    2005--- buttock abscess  . History of pulmonary embolus (PE)    post op 10-09-2017 (surgery on 09-19-2017)  lower range pulmonary artery,  resolved and completed 6 month Eliquis  . History of renal cell carcinoma    s/p  left nephrectomy 09-19-2017  (oncologist -- dr Alen Blew / urologsit-- dr bell)  . 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 Incisional hernia   . MDD (major depressive disorder)   . OA (osteoarthritis)   . OSA on CPAP    followed by dr dohmeier  . Pre-diabetes   . Rectocele   . RLS (restless legs syndrome) 01/24/2015   Per sleep study 05/2014   . Solitary right kidney    acquired 09-19-2017 s/p left nephrectomy  . SUI (stress urinary incontinence, female)    05-21-2019  improved with therapy  . Systemic lupus erythematosus ([place])    rheumotologist--- dr Amil Amen (lov 03-19-2019 in epic)  . Vitamin D deficiency   . Wears glasses    Allergies Allergies  Allergen Reactions  . Bactrim [Sulfamethoxazole-Trimethoprim]     GI upset  . Cymbalta [Duloxetine Hcl]     Sweating  . Acyclovir And Related Itching    SURGICAL HISTORY She  has a past surgical history that includes Cesarean section (x3   last one 1980); Laparoscopic nephrectomy, hand assisted (Left,  09/19/2017); Colonoscopy; EXPLORATORY LAPAROTOMY MODIFIED RADICAL HYSTERECTOMY WITH PELVIC LYMPHADENECTOMY AND RESECTION MECKEL'S DIVERTICULUM (11-24-1999 dr Aldean Ast  @[facility] ); COLD KNIFE CERVICAL CONIZATION  (10/07/1999  @[facility] ); and Incisional hernia repair (N/A, 05/23/2019). FAMILY HISTORY Her family history includes Heart disease in her mother; Hyperlipidemia in her mother; Hypertension in her mother. SOCIAL HISTORY She  reports that she has never smoked. She has never used smokeless tobacco. She reports that she does not drink alcohol and does not use drugs.  Review of Systems  Constitutional: Negative.  Negative for chills and fever.  HENT: Negative.  Eyes: Negative.   Respiratory: Negative.   Cardiovascular: Negative.   Gastrointestinal: Positive for constipation (better with linzess). Negative for abdominal pain, blood in stool, diarrhea, heartburn, melena, nausea and vomiting.  Genitourinary: Positive for frequency and urgency.  Musculoskeletal: Positive for back pain (bilateral lower bacck, tired feeling in bilateral lower hips) and myalgias. Negative for falls, joint pain and neck pain.  Skin: Negative.  Negative for rash.  Neurological: Positive for tingling (bilateral legs). Negative for dizziness, tremors and headaches.  Endo/Heme/Allergies: Negative.   Psychiatric/Behavioral: Negative for depression and memory loss. The patient is not nervous/anxious and does not have insomnia.     Physical Exam: Estimated body mass index is 37.92 kg/m as calculated from the following:   Height as of 02/15/20: 5' 1.5" (1.562 m).   Weight as of this encounter: 204 lb (92.5 kg). Vitals:   05/27/20 1126  BP: 128/88  Temp: 97.7 F (36.5 C)   General Appearance: Well nourished, in no apparent distress. Eyes: PERRLA, EOMs, conjunctiva no swelling or erythema, normal fundi and vessels. Sinuses: No Frontal/maxillary tenderness ENT/Mouth: Ext aud canals clear, normal light reflex with TMs without  erythema, bulging.  Good dentition. No erythema, swelling, or exudate on post pharynx. Tonsils not swollen or erythematous. Hearing normal. Crowded mouth.  Neck: Supple, thyroid normal. No bruits Respiratory: Respiratory effort normal, BS equal bilaterally without rales, rhonchi, wheezing or stridor. Cardio: RRR without murmurs, rubs or gallops. Brisk peripheral pulses without edema.  Chest: symmetric, with normal excursions and percussion. Breasts: breasts appear normal, no suspicious masses, no skin or nipple changes or axillary nodes. Abdomen: Soft, obese +BS. Non tender, no guarding, rebound, masses, or organomegaly. Very small ventral hernia along well healed vertical abdominal scar, easily retractile.  Lymphatics: Left supraclavicular fullness on the left with mobile nontender nodule. Non tender without lymphadenopathy.  Genitourinary: defer Musculoskeletal: Full ROM all peripheral extremities,5/5 strength, and normal gait. Skin: Warm, dry without rashes, lesions, ecchymosis.  Neuro: Cranial nerves intact, reflexes equal bilaterally. Normal muscle tone, no cerebellar symptoms. Sensation intact to monofilament bilateral lower legs but decreased diffusely to bilateral knees.  Psych: Awake and oriented X 3, normal affect, Insight and Judgment appropriate.     Garnet Sierras, Laqueta Jean, DNP [facility] 05/27/2020  1:29 PM

## 2020-05-27 ENCOUNTER — Encounter: Payer: Self-pay | Admitting: Adult Health Nurse Practitioner

## 2020-05-27 ENCOUNTER — Other Ambulatory Visit: Payer: Self-pay

## 2020-05-27 ENCOUNTER — Ambulatory Visit
Admission: RE | Admit: 2020-05-27 | Discharge: 2020-05-27 | Disposition: A | Discharge status: Home / Self Care | Payer: BC Managed Care – PPO | Source: Ambulatory Visit | Attending: Adult Health Nurse Practitioner | Admitting: Adult Health Nurse Practitioner

## 2020-05-27 ENCOUNTER — Ambulatory Visit (INDEPENDENT_AMBULATORY_CARE_PROVIDER_SITE_OTHER): Payer: BC Managed Care – PPO | Admitting: Adult Health Nurse Practitioner

## 2020-05-27 VITALS — BP 128/88 | Temp 97.7°F | Wt 204.0 lb

## 2020-05-27 DIAGNOSIS — M5442 Lumbago with sciatica, left side: Secondary | ICD-10-CM

## 2020-05-27 DIAGNOSIS — I10 Essential (primary) hypertension: Secondary | ICD-10-CM

## 2020-05-27 DIAGNOSIS — R7309 Other abnormal glucose: Secondary | ICD-10-CM | POA: Diagnosis not present

## 2020-05-27 DIAGNOSIS — F3341 Major depressive disorder, recurrent, in partial remission: Secondary | ICD-10-CM

## 2020-05-27 DIAGNOSIS — G629 Polyneuropathy, unspecified: Secondary | ICD-10-CM

## 2020-05-27 DIAGNOSIS — Z9989 Dependence on other enabling machines and devices: Secondary | ICD-10-CM

## 2020-05-27 DIAGNOSIS — M5441 Lumbago with sciatica, right side: Secondary | ICD-10-CM

## 2020-05-27 DIAGNOSIS — Z905 Acquired absence of kidney: Secondary | ICD-10-CM

## 2020-05-27 DIAGNOSIS — F419 Anxiety disorder, unspecified: Secondary | ICD-10-CM

## 2020-05-27 DIAGNOSIS — N816 Rectocele: Secondary | ICD-10-CM

## 2020-05-27 DIAGNOSIS — N393 Stress incontinence (female) (male): Secondary | ICD-10-CM

## 2020-05-27 DIAGNOSIS — E785 Hyperlipidemia, unspecified: Secondary | ICD-10-CM

## 2020-05-27 DIAGNOSIS — M329 Systemic lupus erythematosus, unspecified: Secondary | ICD-10-CM

## 2020-05-27 DIAGNOSIS — E559 Vitamin D deficiency, unspecified: Secondary | ICD-10-CM

## 2020-05-27 DIAGNOSIS — K5909 Other constipation: Secondary | ICD-10-CM

## 2020-05-27 DIAGNOSIS — R202 Paresthesia of skin: Secondary | ICD-10-CM

## 2020-05-27 DIAGNOSIS — Z86711 Personal history of pulmonary embolism: Secondary | ICD-10-CM

## 2020-05-27 DIAGNOSIS — Z85528 Personal history of other malignant neoplasm of kidney: Secondary | ICD-10-CM

## 2020-05-27 DIAGNOSIS — G4733 Obstructive sleep apnea (adult) (pediatric): Secondary | ICD-10-CM

## 2020-05-28 LAB — CBC WITH DIFFERENTIAL/PLATELET
Absolute Monocytes: 528 cells/uL (ref 200–950)
Basophils Absolute: 20 cells/uL (ref 0–200)
Basophils Relative: 0.5 %
Eosinophils Absolute: 112 cells/uL (ref 15–500)
Eosinophils Relative: 2.8 %
HCT: 41.1 % (ref 35.0–45.0)
Hemoglobin: 13.5 g/dL (ref 11.7–15.5)
Lymphs Abs: 1640 cells/uL (ref 850–3900)
MCH: 30 pg (ref 27.0–33.0)
MCHC: 32.8 g/dL (ref 32.0–36.0)
MCV: 91.3 fL (ref 80.0–100.0)
MPV: 9.1 fL (ref 7.5–12.5)
Monocytes Relative: 13.2 %
Neutro Abs: 1700 cells/uL (ref 1500–7800)
Neutrophils Relative %: 42.5 %
Platelets: 259 10*3/uL (ref 140–400)
RBC: 4.5 10*6/uL (ref 3.80–5.10)
RDW: 12.6 % (ref 11.0–15.0)
Total Lymphocyte: 41 %
WBC: 4 10*3/uL (ref 3.8–10.8)

## 2020-05-28 LAB — COMPLETE METABOLIC PANEL WITH GFR
AG Ratio: 1.2 (calc) (ref 1.0–2.5)
ALT: 14 U/L (ref 6–29)
AST: 17 U/L (ref 10–35)
Albumin: 4.1 g/dL (ref 3.6–5.1)
Alkaline phosphatase (APISO): 48 U/L (ref 37–153)
BUN/Creatinine Ratio: 21 (calc) (ref 6–22)
BUN: 24 mg/dL (ref 7–25)
CO2: 29 mmol/L (ref 20–32)
Calcium: 9.9 mg/dL (ref 8.6–10.4)
Chloride: 106 mmol/L (ref 98–110)
Creat: 1.13 mg/dL — ABNORMAL HIGH (ref 0.50–0.99)
GFR, Est African American: 59 mL/min/{1.73_m2} — ABNORMAL LOW (ref >=60)
GFR, Est Non African American: 51 mL/min/{1.73_m2} — ABNORMAL LOW (ref >=60)
Globulin: 3.4 g/dL (calc) (ref 1.9–3.7)
Glucose, Bld: 82 mg/dL (ref 65–99)
Potassium: 5.3 mmol/L (ref 3.5–5.3)
Sodium: 141 mmol/L (ref 135–146)
Total Bilirubin: 0.4 mg/dL (ref 0.2–1.2)
Total Protein: 7.5 g/dL (ref 6.1–8.1)

## 2020-05-28 LAB — LIPID PANEL
Cholesterol: 167 mg/dL (ref <200)
HDL: 38 mg/dL — ABNORMAL LOW (ref >=50)
LDL Cholesterol (Calc): 104 mg/dL (calc) — ABNORMAL HIGH
Non-HDL Cholesterol (Calc): 129 mg/dL (calc) (ref <130)
Total CHOL/HDL Ratio: 4.4 (calc) (ref <5.0)
Triglycerides: 145 mg/dL (ref <150)

## 2020-05-28 LAB — HEMOGLOBIN A1C
Hgb A1c MFr Bld: 6.1 % of total Hgb — ABNORMAL HIGH (ref <5.7)
Mean Plasma Glucose: 128 mg/dL
eAG (mmol/L): 7.1 mmol/L

## 2020-05-28 LAB — VITAMIN B12: Vitamin B-12: 713 pg/mL (ref 200–1100)

## 2020-05-28 LAB — TSH: TSH: 1.07 mIU/L (ref 0.40–4.50)

## 2020-06-03 ENCOUNTER — Other Ambulatory Visit: Payer: Self-pay | Admitting: Adult Health Nurse Practitioner

## 2020-06-03 DIAGNOSIS — M4180 Other forms of scoliosis, site unspecified: Secondary | ICD-10-CM

## 2020-06-03 DIAGNOSIS — R2689 Other abnormalities of gait and mobility: Secondary | ICD-10-CM

## 2020-06-03 DIAGNOSIS — M4186 Other forms of scoliosis, lumbar region: Secondary | ICD-10-CM

## 2020-06-19 NOTE — Progress Notes (Signed)
   Covid-19 Vaccination Clinic  Name:  Beth [name]    MRN: [id number] DOB: 1956/02/18  06/19/2020  [name] was observed post Covid-19 immunization for 15 minutes without incident. She was provided with Vaccine Information Sheet and instruction to access the V-Safe system.   [name] was instructed to call 911 with any severe reactions post vaccine: Marland Kitchen Difficulty breathing  . Swelling of face and throat  . A fast heartbeat  . A bad rash all over body  . Dizziness and weakness   Immunizations Administered    Name Date Dose VIS Date Route   Moderna Covid-19 Booster Vaccine 03/26/2020  9:55 AM 0.25 mL 03/05/2020 Intramuscular   Manufacturer: Moderna   Lot: [id number]   [place]: [id number]

## 2020-06-21 ENCOUNTER — Other Ambulatory Visit: Payer: Self-pay | Admitting: Internal Medicine

## 2020-07-01 ENCOUNTER — Other Ambulatory Visit: Payer: Self-pay | Admitting: Adult Health

## 2020-07-01 DIAGNOSIS — F419 Anxiety disorder, unspecified: Secondary | ICD-10-CM

## 2020-07-01 DIAGNOSIS — F3341 Major depressive disorder, recurrent, in partial remission: Secondary | ICD-10-CM

## 2020-07-01 DIAGNOSIS — Z79899 Other long term (current) drug therapy: Secondary | ICD-10-CM

## 2020-07-07 ENCOUNTER — Encounter: Payer: Self-pay | Admitting: Family Medicine

## 2020-07-08 NOTE — Telephone Encounter (Signed)
RE: cpap supplies and mask Received: Today Jacquelyne Balint, RN Thank you, will process

## 2020-07-09 ENCOUNTER — Encounter: Payer: Self-pay | Admitting: Adult Health Nurse Practitioner

## 2020-07-09 ENCOUNTER — Other Ambulatory Visit: Payer: Self-pay | Admitting: Adult Health Nurse Practitioner

## 2020-07-09 NOTE — Progress Notes (Signed)
Patient contact via MyChart:  "I saw Dr. Leigh Aurora on June 17, 2020 and he changes the Plaquenil with Azathioprine (50 mg) twice a day.  Will return in three (3) months."    Garnet Sierras, Laqueta Jean, DNP [facility] 07/09/2020  9:35 AM

## 2020-07-10 NOTE — Telephone Encounter (Signed)
RE: cpap supplies and mask Received: Adaline Sill, Wilmer Floor, RN I will have someone reach out regarding the mask, sounds like she just wants a different style. However she is most definitely NOT eligible for a new machine. She just got this one in 2020 and is only eligible for a new unit every 5 years.   Hope this helps.

## 2020-08-16 ENCOUNTER — Other Ambulatory Visit: Payer: Self-pay | Admitting: Internal Medicine

## 2020-08-20 ENCOUNTER — Other Ambulatory Visit: Payer: Self-pay

## 2020-08-20 ENCOUNTER — Ambulatory Visit: Payer: BC Managed Care – PPO | Admitting: Internal Medicine

## 2020-08-20 ENCOUNTER — Encounter: Payer: Self-pay | Admitting: Internal Medicine

## 2020-08-20 VITALS — BP 140/80 | HR 60 | Ht 61.5 in | Wt 204.0 lb

## 2020-08-20 DIAGNOSIS — N3946 Mixed incontinence: Secondary | ICD-10-CM

## 2020-08-20 DIAGNOSIS — K5909 Other constipation: Secondary | ICD-10-CM | POA: Diagnosis not present

## 2020-08-20 DIAGNOSIS — N816 Rectocele: Secondary | ICD-10-CM

## 2020-08-20 DIAGNOSIS — R150 Incomplete defecation: Secondary | ICD-10-CM

## 2020-08-20 DIAGNOSIS — K469 Unspecified abdominal hernia without obstruction or gangrene: Secondary | ICD-10-CM

## 2020-08-20 DIAGNOSIS — N9489 Other specified conditions associated with female genital organs and menstrual cycle: Secondary | ICD-10-CM | POA: Diagnosis not present

## 2020-08-20 DIAGNOSIS — M6289 Other specified disorders of muscle: Secondary | ICD-10-CM

## 2020-08-20 NOTE — Patient Instructions (Addendum)
We are placing a referral to Dr [name]. They will call you to set up an appointment.  Due to recent changes in healthcare laws, you may see the results of your imaging and laboratory studies on MyChart before your provider has had a chance to review them.  We understand that in some cases there may be results that are confusing or concerning to you. Not all laboratory results come back in the same time frame and the provider may be waiting for multiple results in order to interpret others.  Please give [place] 48 hours in order for your provider to thoroughly review all the results before contacting the office for clarification of your results.    I appreciate the opportunity to care for you. Silvano Rusk, MD, [facility]

## 2020-08-20 NOTE — Progress Notes (Signed)
Beth [name] 65 y.o. [birth year] [id number]  Assessment & Plan:   Encounter Diagnoses  Name Primary?  . Chronic constipation Yes  . Rectocele   . Urinary incontinence, mixed   . High-tone pelvic floor dysfunction   . Incomplete defecation   . Enterocele    Her constipation is being adequately treated at this time.  I did explain that we have lower doses of Linzess if that was necessary but she seems satisfied with the MiraLAX right now.  I had thought based upon her MRI and her symptoms before she would need surgery but this was not consistent with the exam and clinical impression of Dr. Zigmund Daniel who admittedly is more knowledgeable than I am in these situations.  The patient unfortunately still feels bad.  She did not have a good result with her interactions with Dr. Marvel Plan and Dr. Wendy Poet.  I think much of this may be communication/explanation perhaps.   Her main issue now is urinary leakage still though not as bad as before and she seems comfortable off medication, and the sensation of incomplete defecation.  I did not get a history of intense pain that can be associated with high tone pelvic floor dysfunction.  It is possible I failed to ask correctly.   I have suggested that we get another opinion from our local urogynecologist Dr. [name].  I wonder based upon my excellent experience with our pelvic floor physical therapy staff if that would be worth a repeat try.  I think possibly so.  It does sound like surgery is not in her best interest from what I am reading in the urology and urogynecology notes.  Regarding her constipation that may need tweaking and I can see her back after the above.  I appreciate the opportunity to care for this patient. CC: Beth Pinto, MD Dr. [name]  Subjective:   Chief Complaint: Rectal pain  HPI Patient is a 65 year old African-American woman with a history of lupus and prior pulmonary embolism on  anticoagulation who saw me in 2019 with complaints of constipation and stress urinary incontinence and I had discovered that in 2008 at [facility] she had traditional defecography showing an enterocele and rectocele.  I had her get an MR defecography.  My assessment and plan from that note is as below.  Encounter Diagnoses  Name Primary?  . Rectocele Yes  . Enterocele    . Chronic constipation    . SUI (stress urinary incontinence, female)    . Long term current use of anticoagulant - Eliquis PE    . Lupus (Beth [name])      Pelvic floor problems are the issue here She has an enterocele and a rectocele at least back to 2008. The repair of an enterocele is different than rectocele. I think needs more imaging - wll order MR defecography.   Likely a candidate for surgical repair Doubt pelvic PT would suffice but may be needed post-op? 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   Await GYN input also   Do not think colonoscopy needed at this time   PE and anticoagulation would most likely delay any type of surgery which is elective.      MR defecography IMPRESSION: Dynamic pelvic floor laxity involving anterior and posterior compartments, including mild cystocele with urethral hypermobility, and moderate rectocele. .   On: 12/26/2017 14:35 I encouraged her to get urogynecology evaluation and she did with Dr. Zigmund Daniel at [facility] and the thought was she  had high tone pelvic floor dysfunction and that surgical repair of her problems would make things worse and that the MRI abnormalities of enterocele and rectocele were not relevant.  2019 visit with Dr. Zigmund Daniel  Plan: 1. For defecatory dysfunction with abnormal stool consistency: She has proven that she has signifcant improvement with Linzess. We have refilled this medication for her. We believe that by improving her stool consistency, evacuation will become easier thus improving her pelvic pressure. She  does have abdominal bloating, and will follow up GI for evaluation of this. In addition, we recommend purchasing a squatty potty to help improve evacuation. We did not appreciate dyssynergia on our exam, but use of a squatty potty can help with this. 2. For rectocele seen on MRI: We explained that reads on the MRI often do not correlate with actual anatomic defects that we can see on her exam. We did not appreciate any vaginal support defects even during valsalva. She actually has excellent vaginal support and does not need any correction to her anatomy to improve her defecatory dysfunction. 3. For her history of chromophobe RCC and provoked DVT: she is being followed by oncology and Urology. She is currently NED and is on apixaban.    Last visit with Dr. Zigmund Daniel of urogynecology in August 2020 she had high tone pelvic floor dysfunction and was recommended Zanaflex  1. Discussed findings with the patient. Explained that surgical intervention with posterior repair would likely make her problem worse. Recommended pelvic floor physical therapy along with Zanaflex as needed.  2 Discussed normal pelvic support with the patient today. Explained that high tone pelvic floor dysfunction can result in the pressure and heaviness sensation. Explained again the MRI findings are not relevant in her case.  Follow up: 3 months for pain  Fredia Beets. Derrel Nip, [place] Fellow 4:08 PM, 12/27/18  She subsequently saw Dr. Marvel Plan of gynecology here in [place] and also Dr. Wendy Poet at [facility] and I have reviewed the urology notes, she reports that there was miscommunication and she did not have clear understanding of what was going on.  Dr. Wendy Poet made some recommendations about adding different medications for bladder issues but the patient felt like what ever she was on from Dr. Zigmund Daniel was working.  [facility] and she does not think that made a  difference.  It sounds like she was offered a pessary by Dr. Marvel Plan but again there was not good coordination from the patient's perspective and she lost faith in this approach.  Dr. Vikki Ports was concerned about a sling because of significant post void residuals and urodynamic issues I think.  Reluctant to do so.  She does have lupus she said she also had sciatic issues and azathioprine has helped that.  As far as her defecation she is currently on MiraLAX 3 times daily and that is producing a [place] school scale 6 stool usually daily in the evening and she stopped Linzess because it was "too powerful".  She is still bothered by incomplete defecation and pressure sensation in the rectum.  She still has urinary incontinence that sounds mixed now.  I do not know if she tried Zanaflex to be honest I forgot to ask her today.  She still not satisfied with her quality of life at this point.   Allergies  Allergen Reactions  . Bactrim [Sulfamethoxazole-Trimethoprim]     GI upset  . Cymbalta [Duloxetine Hcl]     Sweating  . Acyclovir  And Related Itching   Current Meds  Medication Sig  . acetaminophen (TYLENOL) 500 MG tablet Take 1,000 mg by mouth every 6 (six) hours as needed for mild pain.  Marland Kitchen ALPRAZolam (XANAX) 0.5 MG tablet Take  1/2 - 1 tablet  1 to 2 x /day  ONLY  if needed for Panic or Anxiety Attack &  limit to 5 days /week to avoid Addiction & Dementia  . azaTHIOprine (IMURAN) 50 MG tablet Take 50 mg by mouth 2 (two) times daily.  Marland Kitchen buPROPion (WELLBUTRIN XL) 150 MG 24 hr tablet TAKE 1 TABLET EVERY MORNING FOR MOOD, FOCUS & CONCENTRATION  . Cholecalciferol (VITAMIN D) 2000 UNITS CAPS Take 1 capsule by mouth daily.  Marland Kitchen escitalopram (LEXAPRO) 20 MG tablet Take  1.5 tablets (30 mg)  Daily  for Mood & Chronic Anxiety  . linaclotide (LINZESS) 290 MCG CAPS capsule Take 290 mcg by mouth daily as needed (constipation).   . Magnesium 500 MG TABS Take 500 mg by mouth daily.    Past Medical History:   Diagnosis Date  . Anxiety   . Chronic constipation    followed by dr Carlean Purl  . CKD (chronic kidney disease), stage III ([place])   . Enterocele   . History of cervical cancer    hx squamous cell cervical carcinoma,   s/p  radical hysterectomy 11-24-1999  . History of MRSA infection    2005--- buttock abscess  . History of pulmonary embolus (PE)    post op 10-09-2017 (surgery on 09-19-2017)  lower range pulmonary artery,  resolved and completed 6 month Eliquis  . History of renal cell carcinoma    s/p  left nephrectomy 09-19-2017  (oncologist -- dr Alen Blew / urologsit-- dr bell)  . 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 Incisional hernia   . MDD (major depressive disorder)   . OA (osteoarthritis)   . OSA on CPAP    followed by dr dohmeier  . Pre-diabetes   . Rectocele   . RLS (restless legs syndrome) 01/24/2015   Per sleep study 05/2014   . Solitary right kidney    acquired 09-19-2017 s/p left nephrectomy  . SUI (stress urinary incontinence, female)    05-21-2019  improved with therapy  . Systemic lupus erythematosus ([place])    rheumotologist--- dr Amil Amen (lov 03-19-2019 in epic)  . Vitamin D deficiency   . Wears glasses    Past Surgical History:  Procedure Laterality Date  . CESAREAN SECTION  x3   last one 1980  . COLD KNIFE CERVICAL CONIZATION   10/07/1999  @[facility]   . COLONOSCOPY     negative per pt, Dr. Alice Reichert - [facility]  . EXPLORATORY LAPAROTOMY MODIFIED RADICAL HYSTERECTOMY WITH PELVIC LYMPHADENECTOMY AND RESECTION MECKEL'S DIVERTICULUM  11-24-1999 dr Aldean Ast  @[facility]   . [place] N/A 05/23/2019   Procedure: LAPAROSCOPIC INCISIONAL HERNIA REPAIR WITH MESH;  Surgeon: Kinsinger, Arta Bruce, MD;  Location: [facility];  Service: General;  Laterality: N/A;  . 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   Review of Systems As per HPI  Objective:   Physical Exam BP 140/80   Pulse 60   Ht 5' 1.5" (1.562 m)   Wt 204 lb (92.5 kg)   BMI 37.92 kg/m  Well-developed obese black woman in no acute distress Abdomen is obese soft and nontender bowel sounds present  Rectal exam with Harrel Lemon CMA present Very small fleshy external hemorrhoid/tags. Normal to decreased resting tone with decreased voluntary squeeze.  There is formed stool in the rectal vault with a small rectocele in my opinion.  Simulated defecation with appropriate abdominal contraction, descent and relaxation of the anal sphincter.

## 2020-08-26 ENCOUNTER — Ambulatory Visit: Payer: BC Managed Care – PPO | Admitting: Adult Health Nurse Practitioner

## 2020-09-18 ENCOUNTER — Encounter: Payer: Self-pay | Admitting: Internal Medicine

## 2020-09-19 ENCOUNTER — Ambulatory Visit: Payer: BC Managed Care – PPO | Admitting: Adult Health

## 2020-09-23 NOTE — Progress Notes (Signed)
PATIENT: Beth [name] DOB: February 14, 1956  REASON FOR VISIT: follow up HISTORY FROM: patient  Chief Complaint  Patient presents with  . Obstructive Sleep Apnea    Rm 1 alone Pt is well and doing good with CPAP     HISTORY OF PRESENT ILLNESS: 09/25/20 ALL: Beth [name] returns for follow up for OSA on CPAP. She continues therapy nightly. She likes her nasal pillow. She is sleeping well. She has all the supplies she needs. She denies concerns.     09/26/2019 ALL:  Beth [name] is a 65 y.o. female here today for follow up for OSA on CPAP and PLMD. She reports doing very well. She is using CPAP nightly without any difficulty. She feels that it continues to help with improved sleep quality and increased energy levels. She did get a new mask and denies any concerns of leak.   Compliance report dated 08/26/2019 through 09/24/2019 reveals that she is used CPAP every night for compliance of 100%.  She has used CPAP greater than 4 hours every night for compliance of 100%.  Average usage was 8 hours and 7 minutes.  Residual AHI was 0.4 on 5 to 10 cm of water and an EPR of 2.  Leak in the 95th percentile now at 7.3 L/min.  HISTORY: (copied from my note on 03/28/2019)  Beth [name] is a 65 y.o. female here today for follow up for OSA on CPAP and periodic limb movement disorder.  She reports doing very well on CPAP therapy.  She has adjusted nicely.  Her only concern is that her mask may be a little small.  She has noticed a little bit of air leak from the bridge of her nose.  She is using a nasal mask.  Previously she had tried nasal pillow.  She is uncertain of which mask is better for her.  She does note significant improvement in sleep and energy levels.  Compliance report dated 02/25/2019 through 03/26/2019 reveals that she is using CPAP every night for compliance of 100%.  Every night she used CPAP greater than 4 hours for compliance 100%.  Average usage was 7 hours and 55 minutes.  AHI was 0.4 on 5  to 10 cm water and an EPR of 2.  Leak noted in the 95th percentile of 19.8.  HISTORY: (copied from Dr Dohmeier's note on 10/25/2018)  Beth [name] a 65 y.o.african - americanfemalepatient, seen virtuallyin a referral from Dr. Everardo All a  sleep evaluation.  Chief complaint according to patient :" my sleep is not refreshing- as it was when I first started on CPAP" .  Sleep and medical history:Had CPAP for about 5 years now, compliant user. She has a history of left kidney cancer. Lupus, pre diabetes, arthritis on Plaquenil.  PLMs were described I her last sleep study. She had a PE in 10-09-2017 following nephrectomy.Nocturia - Dr Nicki Reaper [place] is treating. She has a rectocele- has PT  Family sleep and medical history:No OSA in immediate family. One sister who just passed 03/2018.  Social history:Patient is part time from home and part from her office. Daytime 8 -5 .  Non-Smoker, Non Drinker, no caffeine use.  3 people live in the household, you and mother and aunt.  Sleep habits are as follows: Dinner time is 7 PM, bed time 9.30-10 before she started phentermine. Now sleeps after 10.30 PM.  Struggling to go to sleep since being on weight loss therapy. Uses a sleep aid, OTC. The bedroom is cool,but the TV is  on- for much of the night. Sleep on her sides, left preferred, and on 1 flat pillow. She reports are dreams, not vivid. One nocturia.   REVIEW OF SYSTEMS: Out of a complete 14 system review of symptoms, none the patient complains only of the following symptoms, none and all other reviewed systems are negative.  ESS: 0   ALLERGIES: Allergies  Allergen Reactions  . Bactrim [Sulfamethoxazole-Trimethoprim]     GI upset  . Cymbalta [Duloxetine Hcl]     Sweating  . Acyclovir And Related Itching    HOME MEDICATIONS: Outpatient Medications Prior to Visit  Medication Sig Dispense Refill  . acetaminophen (TYLENOL) 500 MG tablet Take 1,000  mg by mouth every 6 (six) hours as needed for mild pain.    Marland Kitchen ALPRAZolam (XANAX) 0.5 MG tablet Take  1/2 - 1 tablet  1 to 2 x /day  ONLY  if needed for Panic or Anxiety Attack &  limit to 5 days /week to avoid Addiction & Dementia 50 tablet 0  . azaTHIOprine (IMURAN) 50 MG tablet Take 50 mg by mouth 2 (two) times daily.    Marland Kitchen buPROPion (WELLBUTRIN XL) 150 MG 24 hr tablet TAKE 1 TABLET EVERY MORNING FOR MOOD, FOCUS & CONCENTRATION 90 tablet 1  . Cholecalciferol (VITAMIN D) 2000 UNITS CAPS Take 1 capsule by mouth daily.    Marland Kitchen escitalopram (LEXAPRO) 20 MG tablet Take  1.5 tablets (30 mg)  Daily  for Mood & Chronic Anxiety 135 tablet 1  . linaclotide (LINZESS) 290 MCG CAPS capsule Take 290 mcg by mouth daily as needed (constipation).     . Magnesium 500 MG TABS Take 500 mg by mouth daily.     . polyethylene glycol (MIRALAX / GLYCOLAX) 17 g packet Take 17 g by mouth in the morning, at noon, and at bedtime.     No facility-administered medications prior to visit.    PAST MEDICAL HISTORY: Past Medical History:  Diagnosis Date  . Anxiety   . Chronic constipation    followed by dr Carlean Purl  . CKD (chronic kidney disease), stage III ([place])   . Enterocele   . History of cervical cancer    hx squamous cell cervical carcinoma,   s/p  radical hysterectomy 11-24-1999  . History of MRSA infection    2005--- buttock abscess  . History of pulmonary embolus (PE)    post op 10-09-2017 (surgery on 09-19-2017)  lower range pulmonary artery,  resolved and completed 6 month Eliquis  . History of renal cell carcinoma    s/p  left nephrectomy 09-19-2017  (oncologist -- dr Alen Blew / urologsit-- dr bell)  . 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 Incisional hernia   . MDD (major depressive disorder)   . OA (osteoarthritis)   . OSA on CPAP     followed by dr dohmeier  . Pre-diabetes   . Rectocele   . RLS (restless legs syndrome) 01/24/2015   Per sleep study 05/2014   . Solitary right kidney    acquired 09-19-2017 s/p left nephrectomy  . SUI (stress urinary incontinence, female)    05-21-2019  improved with therapy  . Systemic lupus erythematosus ([place])    rheumotologist--- dr Amil Amen (lov 03-19-2019 in epic)  . Vitamin D deficiency   . Wears glasses  PAST SURGICAL HISTORY: Past Surgical History:  Procedure Laterality Date  . CESAREAN SECTION  x3   last one 1980  . COLD KNIFE CERVICAL CONIZATION   10/07/1999  @[facility]   . COLONOSCOPY     negative per pt, Dr. Alice Reichert - [facility]  . EXPLORATORY LAPAROTOMY MODIFIED RADICAL HYSTERECTOMY WITH PELVIC LYMPHADENECTOMY AND RESECTION MECKEL'S DIVERTICULUM  11-24-1999 dr Aldean Ast  @[facility]   . INCISIONAL HERNIA REPAIR N/A 05/23/2019   Procedure: LAPAROSCOPIC INCISIONAL HERNIA REPAIR WITH MESH;  Surgeon: Kinsinger, Arta Bruce, MD;  Location: [facility];  Service: General;  Laterality: N/A;  . LAPAROSCOPIC NEPHRECTOMY, HAND ASSISTED Left 09/19/2017   Procedure: LEFT HAND ASSISTED LAPAROSCOPIC NEPHRECTOMY;  Surgeon: Lucas Mallow, MD;  Location: [facility];  Service: Urology;  Laterality: Left;    FAMILY HISTORY: Family History  Problem Relation Age of Onset  . Hyperlipidemia Mother   . Hypertension Mother   . Heart disease Mother     SOCIAL HISTORY: Social History   Socioeconomic History  . Marital status: Single    Spouse name: Not on file  . Number of children: 3  . Years of education: Not on file  . Highest education level: Not on file  Occupational History  . Occupation: Network engineer  Tobacco Use  . Smoking status: Never Smoker  . Smokeless tobacco: Never Used  Vaping Use  . Vaping Use: Never used  Substance and Sexual Activity  . Alcohol use: No  . Drug use: Never  . Sexual activity: Not Currently  Other Topics Concern  . Not on file  Social History Narrative    Single - 3 children   Secretary [employer]   Never smoker, No ETOH/drugs   Social Determinants of Radio broadcast assistant Strain: Not on file  Food Insecurity: Not on file  Transportation Needs: Not on file  Physical Activity: Not on file  Stress: Not on file  Social Connections: Not on file  Intimate Partner Violence: Not on file      PHYSICAL EXAM  Vitals:   09/25/20 0745  BP: (!) 144/82  Pulse: 63  Weight: 204 lb (92.5 kg)  Height: 5\' 1"  (1.549 m)   Body mass index is 38.55 kg/m.  Generalized: Well developed, in no acute distress  Cardiology: normal rate and rhythm, no murmur noted Respiratory: clear to auscultation bilaterally  Neurological examination  Mentation: Alert oriented to time, place, history taking. Follows all commands speech and language fluent Cranial nerve II-XII: Pupils were equal round reactive to light. Extraocular movements were full Motor: The motor testing reveals 5 over 5 strength of all 4 extremities. Good symmetric motor tone is noted throughout.  Gait and station: Gait is normal.   DIAGNOSTIC DATA (LABS, IMAGING, TESTING) - I reviewed patient records, labs, notes, testing and imaging myself where available.  No flowsheet data found.   Lab Results  Component Value Date   WBC 4.0 05/27/2020   HGB 13.5 05/27/2020   HCT 41.1 05/27/2020   MCV 91.3 05/27/2020   PLT 259 05/27/2020      Component Value Date/Time   NA 141 05/27/2020 1210   K 5.3 05/27/2020 1210   CL 106 05/27/2020 1210   CO2 29 05/27/2020 1210   GLUCOSE 82 05/27/2020 1210   BUN 24 05/27/2020 1210   CREATININE 1.13 (H) 05/27/2020 1210   CALCIUM 9.9 05/27/2020 1210   PROT 7.5 05/27/2020 1210   ALBUMIN 3.4 (L) 10/09/2017 0947   AST 17 05/27/2020 1210  ALT 14 05/27/2020 1210   ALKPHOS 52 10/09/2017 0947   BILITOT 0.4 05/27/2020 1210   GFRNONAA 51 (L) 05/27/2020 1210   GFRAA 59 (L) 05/27/2020 1210   Lab Results  Component Value Date   CHOL 167 05/27/2020    HDL 38 (L) 05/27/2020   LDLCALC 104 (H) 05/27/2020   TRIG 145 05/27/2020   CHOLHDL 4.4 05/27/2020   Lab Results  Component Value Date   HGBA1C 6.1 (H) 05/27/2020   Lab Results  Component Value Date   VCBSWHQP59 163 05/27/2020   Lab Results  Component Value Date   TSH 1.07 05/27/2020       ASSESSMENT AND PLAN 65 y.o. year old female  has a past medical history of Anxiety, Chronic constipation, CKD (chronic kidney disease), stage III ([place]), Enterocele, History of cervical cancer, History of MRSA infection, History of pulmonary embolus (PE), History of renal cell carcinoma, History of seizure, Incisional hernia, MDD (major depressive disorder), OA (osteoarthritis), OSA on CPAP, Pre-diabetes, Rectocele, RLS (restless legs syndrome) (01/24/2015), Solitary right kidney, SUI (stress urinary incontinence, female), Systemic lupus erythematosus ([place]), Vitamin D deficiency, and Wears glasses. here with     ICD-10-CM   1. OSA on CPAP  G47.33 For home use only DME continuous positive airway pressure (CPAP)   Z99.89      Shalika is doing very well.  Compliance report reveals excellent compliance. She was encouraged to continue using CPAP nightly and for greater than 4 hours each night. Supply orders updated. Healthy lifestyle habits encouraged.  She will follow-up with [place] in 1 year, sooner if needed.  She verbalizes understanding and agreement with this plan.   Orders Placed This Encounter  Procedures  . For home use only DME continuous positive airway pressure (CPAP)    Supplies    Order Specific Question:   Length of Need    Answer:   Lifetime    Order Specific Question:   Patient has OSA or probable OSA    Answer:   Yes    Order Specific Question:   Is the patient currently using CPAP in the home    Answer:   Yes    Order Specific Question:   Settings    Answer:   Other see comments    Order Specific Question:   CPAP supplies needed    Answer:   Mask, headgear, cushions, filters,  heated tubing and water chamber     No orders of the defined types were placed in this encounter.      [name], FNP-C 09/25/2020, 8:11 AM [facility] [address] [place], [place] [address] [phone]

## 2020-09-23 NOTE — Patient Instructions (Addendum)
Please continue using your CPAP regularly. While your insurance requires that you use CPAP at least 4 hours each night on 70% of the nights, I recommend, that you not skip any nights and use it throughout the night if you can. Getting used to CPAP and staying with the treatment long term does take time and patience and discipline. Untreated obstructive sleep apnea when it is moderate to severe can have an adverse impact on cardiovascular health and raise her risk for heart disease, arrhythmias, hypertension, congestive heart failure, stroke and diabetes. Untreated obstructive sleep apnea causes sleep disruption, nonrestorative sleep, and sleep deprivation. This can have an impact on your day to day functioning and cause daytime sleepiness and impairment of cognitive function, memory loss, mood disturbance, and problems focussing. Using CPAP regularly can improve these symptoms.   Follow up in 1 year   Sleep Apnea Sleep apnea affects breathing during sleep. It causes breathing to stop for a short time or to become shallow. It can also increase the risk of:  Heart attack.  Stroke.  Being very overweight (obese).  Diabetes.  Heart failure.  Irregular heartbeat. The goal of treatment is to help you breathe normally again. What are the causes? There are three kinds of sleep apnea:  Obstructive sleep apnea. This is caused by a blocked or collapsed airway.  Central sleep apnea. This happens when the brain does not send the right signals to the muscles that control breathing.  Mixed sleep apnea. This is a combination of obstructive and central sleep apnea. The most common cause of this condition is a collapsed or blocked airway. This can happen if:  Your throat muscles are too relaxed.  Your tongue and tonsils are too large.  You are overweight.  Your airway is too small.   What increases the risk?  Being overweight.  Smoking.  Having a small airway.  Being older.  Being  female.  Drinking alcohol.  Taking medicines to calm yourself (sedatives or tranquilizers).  Having family members with the condition. What are the signs or symptoms?  Trouble staying asleep.  Being sleepy or tired during the day.  Getting angry a lot.  Loud snoring.  Headaches in the morning.  Not being able to focus your mind (concentrate).  Forgetting things.  Less interest in sex.  Mood swings.  Personality changes.  Feelings of sadness (depression).  Waking up a lot during the night to pee (urinate).  Dry mouth.  Sore throat. How is this diagnosed?  Your medical history.  A physical exam.  A test that is done when you are sleeping (sleep study). The test is most often done in a sleep lab but may also be done at home. How is this treated?  Sleeping on your side.  Using a medicine to get rid of mucus in your nose (decongestant).  Avoiding the use of alcohol, medicines to help you relax, or certain pain medicines (narcotics).  Losing weight, if needed.  Changing your diet.  Not smoking.  Using a machine to open your airway while you sleep, such as: ? An oral appliance. This is a mouthpiece that shifts your lower jaw forward. ? A CPAP device. This device blows air through a mask when you breathe out (exhale). ? An EPAP device. This has valves that you put in each nostril. ? A BPAP device. This device blows air through a mask when you breathe in (inhale) and breathe out.  Having surgery if other treatments do not work. It   is important to get treatment for sleep apnea. Without treatment, it can lead to:  High blood pressure.  Coronary artery disease.  In men, not being able to have an erection (impotence).  Reduced thinking ability.   Follow these instructions at home: Lifestyle  Make changes that your doctor recommends.  Eat a healthy diet.  Lose weight if needed.  Avoid alcohol, medicines to help you relax, and some pain  medicines.  Do not use any products that contain nicotine or tobacco, such as cigarettes, e-cigarettes, and chewing tobacco. If you need help quitting, ask your doctor. General instructions  Take over-the-counter and prescription medicines only as told by your doctor.  If you were given a machine to use while you sleep, use it only as told by your doctor.  If you are having surgery, make sure to tell your doctor you have sleep apnea. You may need to bring your device with you.  Keep all follow-up visits as told by your doctor. This is important. Contact a doctor if:  The machine that you were given to use during sleep bothers you or does not seem to be working.  You do not get better.  You get worse. Get help right away if:  Your chest hurts.  You have trouble breathing in enough air.  You have an uncomfortable feeling in your back, arms, or stomach.  You have trouble talking.  One side of your body feels weak.  A part of your face is hanging down. These symptoms may be an emergency. Do not wait to see if the symptoms will go away. Get medical help right away. Call your local emergency services (911 in the [place] Do not drive yourself to the hospital. Summary  This condition affects breathing during sleep.  The most common cause is a collapsed or blocked airway.  The goal of treatment is to help you breathe normally while you sleep. This information is not intended to replace advice given to you by your health care provider. Make sure you discuss any questions you have with your health care provider. Document Revised: 02/17/2018 Document Reviewed: 12/27/2017 Elsevier Patient Education  2021 [employer] Inc.  

## 2020-09-25 ENCOUNTER — Encounter: Payer: Self-pay | Admitting: Family Medicine

## 2020-09-25 ENCOUNTER — Ambulatory Visit: Payer: BC Managed Care – PPO | Admitting: Family Medicine

## 2020-09-25 ENCOUNTER — Ambulatory Visit: Payer: BC Managed Care – PPO | Admitting: Obstetrics and Gynecology

## 2020-09-25 ENCOUNTER — Other Ambulatory Visit: Payer: Self-pay

## 2020-09-25 VITALS — BP 144/82 | HR 63 | Ht 61.0 in | Wt 204.0 lb

## 2020-09-25 DIAGNOSIS — Z9989 Dependence on other enabling machines and devices: Secondary | ICD-10-CM

## 2020-09-25 DIAGNOSIS — G4733 Obstructive sleep apnea (adult) (pediatric): Secondary | ICD-10-CM

## 2020-10-15 ENCOUNTER — Other Ambulatory Visit: Payer: Self-pay | Admitting: Internal Medicine

## 2020-10-19 ENCOUNTER — Other Ambulatory Visit: Payer: Self-pay

## 2020-10-19 ENCOUNTER — Encounter (HOSPITAL_COMMUNITY): Payer: Self-pay | Admitting: Emergency Medicine

## 2020-10-19 ENCOUNTER — Ambulatory Visit (HOSPITAL_COMMUNITY)
Admission: EM | Admit: 2020-10-19 | Discharge: 2020-10-19 | Disposition: A | Discharge status: Home / Self Care | Payer: BC Managed Care – PPO | Attending: Emergency Medicine | Admitting: Emergency Medicine

## 2020-10-19 DIAGNOSIS — H109 Unspecified conjunctivitis: Secondary | ICD-10-CM | POA: Diagnosis not present

## 2020-10-19 DIAGNOSIS — B9689 Other specified bacterial agents as the cause of diseases classified elsewhere: Secondary | ICD-10-CM | POA: Diagnosis not present

## 2020-10-19 MED ORDER — POLYMYXIN B-TRIMETHOPRIM 10000-0.1 UNIT/ML-% OP SOLN: 1.0000 [drp] | OPHTHALMIC | 0 refills | Status: DC

## 2020-10-19 NOTE — ED Triage Notes (Signed)
Pt presents with "foggy vision" xs 3 days. States woke up with left eye pain and swelling.

## 2020-10-19 NOTE — Discharge Instructions (Signed)
Take 1 drop each eye every 4 hours for the next 5 days, if no improvement seen by day 3 please follow up with ophthalmology  Try not to rub eyes to prevent further irritation, can dab eyes with soft tissue, apply cool compresses for comfort  Can take antihistamine to help with itching example, Claritin, Zyrtec or benadryl

## 2020-10-19 NOTE — ED Provider Notes (Signed)
[facility]    CSN: [id number] Arrival date & time: 10/19/20  1007      History   Chief Complaint Chief Complaint  Patient presents with  . Eye Pain    Left  . Eye Problem    HPI Beth [name] is a 65 y.o. female.   Patient presents with left eye pain, swelling, redness, discharge, sensitivity to light watering and itching that began three days ago. Initially began with hazy vision that has now resolved. Right eye has begun to become red. Has been dabbing eye with tissue and intermittently rubbing eye. Denies blurred vision, diplopia. Last exam appointment 2 years ago. Wearing glasses, does not wear contacts.   Past Medical History:  Diagnosis Date  . Anxiety   . Chronic constipation    followed by dr Carlean Purl  . CKD (chronic kidney disease), stage III ([place])   . Enterocele   . History of cervical cancer    hx squamous cell cervical carcinoma,   s/p  radical hysterectomy 11-24-1999  . History of MRSA infection    2005--- buttock abscess  . History of pulmonary embolus (PE)    post op 10-09-2017 (surgery on 09-19-2017)  lower range pulmonary artery,  resolved and completed 6 month Eliquis  . History of renal cell carcinoma    s/p  left nephrectomy 09-19-2017  (oncologist -- dr Alen Blew / urologsit-- dr bell)  . 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 Incisional hernia   . MDD (major depressive disorder)   . OA (osteoarthritis)   . OSA on CPAP    followed by dr dohmeier  . Pre-diabetes   . Rectocele   . RLS (restless legs syndrome) 01/24/2015   Per sleep study 05/2014   . Solitary right kidney    acquired 09-19-2017 s/p left nephrectomy  . SUI (stress urinary incontinence, female)    05-21-2019  improved with therapy  . Systemic lupus erythematosus ([place])    rheumotologist--- dr Amil Amen (lov  03-19-2019 in epic)  . Vitamin D deficiency   . Wears glasses     Patient Active Problem List   Diagnosis Date Noted  . OSA on CPAP 01/16/2019  . Rectocele 12/04/2017  . History of pulmonary embolus (PE) 10/09/2017  . History of renal cell carcinoma 10/09/2017  . H/O left nephrectomy 10/09/2017  . Depression 10/09/2017  . Anxiety 10/09/2017  . RLS (restless legs syndrome) 01/24/2015  . Obesity (BMI 30.0-34.9) 05/28/2014  . SUI (stress urinary incontinence, female) 05/28/2014  . Medication management 05/28/2014  . Hypertension   . Abnormal glucose   . Hyperlipidemia   . Vitamin D deficiency   . Environmental allergies 07/14/2006  . Chronic constipation 07/14/2006  . SLE 07/14/2006    Past Surgical History:  Procedure Laterality Date  . CESAREAN SECTION  x3   last one 1980  . COLD KNIFE CERVICAL CONIZATION   10/07/1999  @[facility]   . COLONOSCOPY     negative per pt, Dr. Alice Reichert - [facility]  . EXPLORATORY LAPAROTOMY MODIFIED RADICAL HYSTERECTOMY WITH PELVIC LYMPHADENECTOMY AND RESECTION MECKEL'S DIVERTICULUM  11-24-1999 dr Aldean Ast  @[facility]   . [place] N/A 05/23/2019   Procedure: LAPAROSCOPIC INCISIONAL HERNIA REPAIR WITH MESH;  Surgeon: Kinsinger, Arta Bruce, MD;  Location: [facility];  Service: General;  Laterality: N/A;  .  LAPAROSCOPIC NEPHRECTOMY, HAND ASSISTED Left 09/19/2017   Procedure: LEFT HAND ASSISTED LAPAROSCOPIC NEPHRECTOMY;  Surgeon: Lucas Mallow, MD;  Location: [facility];  Service: Urology;  Laterality: Left;    OB History   No obstetric history on file.      Home Medications    Prior to Admission medications   Medication Sig Start Date End Date Taking? Authorizing Provider  trimethoprim-polymyxin b (POLYTRIM) ophthalmic solution Place 1 drop into both eyes every 4 (four) hours. 10/19/20  Yes Braniyah Besse, Leitha Schuller, NP  acetaminophen (TYLENOL) 500 MG tablet Take 1,000 mg by mouth every 6 (six) hours as needed for mild pain.    [provider]  ALPRAZolam (XANAX) 0.5 MG tablet TAKE 1/2 - 1 TABLET 1 TO 2 TIMES A DAY ONLY IF NEEDED FOR PANIC OR ANXIETY ATTACK & LIMIT TO 5 DAYS /WEEK TO AVOID ADDICTION & DEMENTIA 10/15/20   [name], NP  azaTHIOprine (IMURAN) 50 MG tablet Take 50 mg by mouth 2 (two) times daily. 06/26/20   [provider]  buPROPion (WELLBUTRIN XL) 150 MG 24 hr tablet TAKE 1 TABLET EVERY MORNING FOR MOOD, FOCUS & CONCENTRATION 06/21/20   [name], NP  Cholecalciferol (VITAMIN D) 2000 UNITS CAPS Take 1 capsule by mouth daily.    [provider]  escitalopram (LEXAPRO) 20 MG tablet Take  1.5 tablets (30 mg)  Daily  for Mood & Chronic Anxiety 07/01/20   [name], MD  linaclotide [facility]) 290 MCG CAPS capsule Take 290 mcg by mouth daily as needed (constipation).     [provider]  Magnesium 500 MG TABS Take 500 mg by mouth daily.     [provider]  polyethylene glycol (MIRALAX / GLYCOLAX) 17 g packet Take 17 g by mouth in the morning, at noon, and at bedtime.    [provider]    Family History Family History  Problem Relation Age of Onset  . Hyperlipidemia Mother   . Hypertension Mother   . Heart disease Mother     Social History Social History   Tobacco Use  . Smoking status: Never Smoker  . Smokeless tobacco: Never Used  Vaping Use  . Vaping Use: Never used  Substance Use Topics  . Alcohol use: No  . Drug use: Never     Allergies   Bactrim [sulfamethoxazole-trimethoprim], Cymbalta [duloxetine hcl], and Acyclovir and related   Review of Systems Review of Systems  Defer to HPI    Physical Exam Triage Vital Signs ED Triage Vitals  Enc Vitals Group     BP 10/19/20 1029 (!) 156/77     Pulse Rate 10/19/20 1029 68     Resp 10/19/20 1029 17     Temp 10/19/20 1029 98.2 F (36.8 C)     Temp Source 10/19/20 1029 Oral     SpO2 10/19/20 1029 98 %     Weight --      Height --      Head Circumference --      Peak Flow --       Pain Score 10/19/20 1027 4     Pain Loc --      Pain Edu? --      Excl. in [place]? --    No data found.  Updated Vital Signs BP (!) 156/77 (BP Location: Right Arm)   Pulse 68   Temp 98.2 F (36.8 C) (Oral)   Resp 17   SpO2 98%   Visual Acuity Right Eye  Distance: 20/40 Left Eye Distance: 20/70 Bilateral Distance: 20/40  Right Eye Near:   Left Eye Near:    Bilateral Near:     Physical Exam Constitutional:      Appearance: Normal appearance. She is normal weight.  HENT:     Head: Normocephalic.  Eyes:     General: Lids are normal. Lids are everted, no foreign bodies appreciated. Vision grossly intact. Gaze aligned appropriately.        Left eye: Discharge present.No foreign body or hordeolum.     Extraocular Movements: Extraocular movements intact.     Comments: Tearing of left eye, redness present across entire sclera of left eye, redness spreading from inner conjunctivae of right eye. mild swelling on lower external eye,   Pulmonary:     Effort: Pulmonary effort is normal.  Musculoskeletal:        General: Normal range of motion.     Cervical back: Normal range of motion.  Skin:    General: Skin is warm and dry.  Neurological:     Mental Status: She is alert and oriented to person, place, and time. Mental status is at baseline.  Psychiatric:        Mood and Affect: Mood normal.        Behavior: Behavior normal.      UC Treatments / Results  Labs (all labs ordered are listed, but only abnormal results are displayed) Labs Reviewed - No data to display  EKG   Radiology No results found.  Procedures Procedures (including critical care time)  Medications Ordered in UC Medications - No data to display  Initial Impression / Assessment and Plan / UC Course  I have reviewed the triage vital signs and the nursing notes.  Pertinent labs & imaging results that were available during my care of the patient were reviewed by me and considered in my medical  decision making (see chart for details).  Bacterial conjunctivitis of both eyes  1. Polytrim 1 drop both eyes, if symptoms don't begin to improve after three days, follow up with opthalmology  2. Advise to stop eye rubbing, use cold compresses to soothe and only dab eye to remove discharge 3. Advised otc antihistamine to help with itching Final Clinical Impressions(s) / UC Diagnoses   Final diagnoses:  Bacterial conjunctivitis of both eyes     Discharge Instructions     Take 1 drop each eye every 4 hours for the next 5 days, if no improvement seen by day 3 please follow up with ophthalmology  Try not to rub eyes to prevent further irritation, can dab eyes with soft tissue, apply cool compresses for comfort  Can take antihistamine to help with itching example, Claritin, Zyrtec or benadryl       ED Prescriptions    Medication Sig Dispense Auth. Provider   trimethoprim-polymyxin b (POLYTRIM) ophthalmic solution Place 1 drop into both eyes every 4 (four) hours. 10 mL Hans Eden, NP     PDMP not reviewed this encounter.   Hans Eden, NP 10/19/20 1130

## 2020-10-29 ENCOUNTER — Ambulatory Visit: Payer: BC Managed Care – PPO | Admitting: Adult Health

## 2020-10-29 ENCOUNTER — Encounter: Payer: Self-pay | Admitting: Adult Health

## 2020-10-29 ENCOUNTER — Other Ambulatory Visit: Payer: Self-pay

## 2020-10-29 VITALS — BP 152/80 | Temp 96.6°F | Wt 206.0 lb

## 2020-10-29 DIAGNOSIS — F419 Anxiety disorder, unspecified: Secondary | ICD-10-CM | POA: Diagnosis not present

## 2020-10-29 DIAGNOSIS — I10 Essential (primary) hypertension: Secondary | ICD-10-CM

## 2020-10-29 DIAGNOSIS — E785 Hyperlipidemia, unspecified: Secondary | ICD-10-CM

## 2020-10-29 DIAGNOSIS — R7309 Other abnormal glucose: Secondary | ICD-10-CM

## 2020-10-29 DIAGNOSIS — Z79899 Other long term (current) drug therapy: Secondary | ICD-10-CM

## 2020-10-29 DIAGNOSIS — G4733 Obstructive sleep apnea (adult) (pediatric): Secondary | ICD-10-CM

## 2020-10-29 DIAGNOSIS — F3341 Major depressive disorder, recurrent, in partial remission: Secondary | ICD-10-CM

## 2020-10-29 DIAGNOSIS — E559 Vitamin D deficiency, unspecified: Secondary | ICD-10-CM

## 2020-10-29 DIAGNOSIS — N1831 Chronic kidney disease, stage 3a: Secondary | ICD-10-CM | POA: Insufficient documentation

## 2020-10-29 DIAGNOSIS — M329 Systemic lupus erythematosus, unspecified: Secondary | ICD-10-CM

## 2020-10-29 DIAGNOSIS — Z9989 Dependence on other enabling machines and devices: Secondary | ICD-10-CM

## 2020-10-29 MED ORDER — CIPROFLOXACIN HCL 0.3 % OP SOLN: OPHTHALMIC | 0 refills | Status: DC

## 2020-10-29 NOTE — Patient Instructions (Addendum)
Goals      Blood Pressure < 130/80     DIET - EAT MORE FRUITS AND VEGETABLES     Aim for 5-7+ 1/2 cup servings of fresh fruits and veggies daily  Beans daily  Whole grains Nuts/seeds      Weight (lb) < 195 lb (88.5 kg)       Recommend stopping ice cream   Can try fresh fruit, frozen fruit as a smoothie Look up recipes for "nice cream"  - healthy ice cream alternatives  Try to do more beans, whole grains, nuts/seeds (chia, ground flax seeds, walnuts, almonds) instead of chicken. Fish a few days a week is ok/good.   Try to avoid all sugar/flour/high fructose corn syrup - these trigger insulin response and fat storage  Will call when we get samples for semaglutide (ozempic/wegovy)    HYPERTENSION INFORMATION  Monitor your blood pressure at home, please keep a record and bring that in with you to your next office visit.   Go to the ER if any CP, SOB, nausea, dizziness, severe HA, changes vision/speech  Testing/Procedures: HOW TO TAKE YOUR BLOOD PRESSURE: Rest 5 minutes before taking your blood pressure. Don't smoke or drink caffeinated beverages for at least 30 minutes before. Take your blood pressure before (not after) you eat. Sit comfortably with your back supported and both feet on the floor (don't cross your legs). Elevate your arm to heart level on a table or a desk. Use the proper sized cuff. It should fit smoothly and snugly around your bare upper arm. There should be enough room to slip a fingertip under the cuff. The bottom edge of the cuff should be 1 inch above the crease of the elbow.  Due to a recent study, SPRINT, we have changed our goal for the systolic or top blood pressure number. Ideally we want your top number at 120.  In the [facility] Trial, 5000 people were randomized to a goal BP of 120 and 5000 people were randomized to a goal BP of less than 140. The patients with the goal BP at 120 had LESS DEMENTIA, LESS HEART ATTACKS, AND LESS STROKES, AS WELL AS  OVERALL DECREASED MORTALITY OR DEATH RATE.   There was another study that showed taking your blood pressure medications at night decrease cardiovascular events.  However if you are on a fluid pill, please take this in the morning.   If you are willing, our goal BP is the top number of 120.  Your most recent BP: BP: (!) 152/80   Take your medications faithfully as instructed. Maintain a healthy weight. Get at least 150 minutes of aerobic exercise per week. Minimize salt intake. Minimize alcohol intake  DASH Eating Plan DASH stands for "Dietary Approaches to Stop Hypertension." The DASH eating plan is a healthy eating plan that has been shown to reduce high blood pressure (hypertension). Additional health benefits may include reducing the risk of type 2 diabetes mellitus, heart disease, and stroke. The DASH eating plan may also help with weight loss. WHAT DO I NEED TO KNOW ABOUT THE DASH EATING PLAN? For the DASH eating plan, you will follow these general guidelines: Choose foods with a percent daily value for sodium of less than 5% (as listed on the food label). Use salt-free seasonings or herbs instead of table salt or sea salt. Check with your health care provider or pharmacist before using salt substitutes. Eat lower-sodium products, often labeled as "lower sodium" or "no salt added." Eat fresh foods.  Eat more vegetables, fruits, and low-fat dairy products. Choose whole grains. Look for the word "whole" as the first word in the ingredient list. Choose fish and skinless chicken or [place] more often than red meat. Limit fish, poultry, and meat to 6 oz (170 g) each day. Limit sweets, desserts, sugars, and sugary drinks. Choose heart-healthy fats. Limit cheese to 1 oz (28 g) per day. Eat more home-cooked food and less restaurant, buffet, and fast food. Limit fried foods. Cook foods using methods other than frying. Limit canned vegetables. If you do use them, rinse them well to decrease  the sodium. When eating at a restaurant, ask that your food be prepared with less salt, or no salt if possible. WHAT FOODS CAN I EAT? Seek help from a dietitian for individual calorie needs. Grains Whole grain or whole wheat bread. Brown rice. Whole grain or whole wheat pasta. Quinoa, bulgur, and whole grain cereals. Low-sodium cereals. Corn or whole wheat flour tortillas. Whole grain cornbread. Whole grain crackers. Low-sodium crackers. Vegetables Fresh or frozen vegetables (raw, steamed, roasted, or grilled). Low-sodium or reduced-sodium tomato and vegetable juices. Low-sodium or reduced-sodium tomato sauce and paste. Low-sodium or reduced-sodium canned vegetables.  Fruits All fresh, canned (in natural juice), or frozen fruits. Meat and Other Protein Products Ground beef (85% or leaner), grass-fed beef, or beef trimmed of fat. Skinless chicken or [place]. Ground chicken or [place]. Pork trimmed of fat. All fish and seafood. Eggs. Dried beans, peas, or lentils. Unsalted nuts and seeds. Unsalted canned beans. Dairy Low-fat dairy products, such as skim or 1% milk, 2% or reduced-fat cheeses, low-fat ricotta or cottage cheese, or plain low-fat yogurt. Low-sodium or reduced-sodium cheeses. Fats and Oils Tub margarines without trans fats. Light or reduced-fat mayonnaise and salad dressings (reduced sodium). Avocado. Safflower, olive, or canola oils. Natural peanut or almond butter. Other Unsalted popcorn and pretzels. The items listed above may not be a complete list of recommended foods or beverages. Contact your dietitian for more options. WHAT FOODS ARE NOT RECOMMENDED? Grains White bread. White pasta. White rice. Refined cornbread. Bagels and croissants. Crackers that contain trans fat. Vegetables Creamed or fried vegetables. Vegetables in a cheese sauce. Regular canned vegetables. Regular canned tomato sauce and paste. Regular tomato and vegetable juices. Fruits Dried fruits. Canned fruit in  light or heavy syrup. Fruit juice. Meat and Other Protein Products Fatty cuts of meat. Ribs, chicken wings, bacon, sausage, bologna, salami, chitterlings, fatback, hot dogs, bratwurst, and packaged luncheon meats. Salted nuts and seeds. Canned beans with salt. Dairy Whole or 2% milk, cream, half-and-half, and cream cheese. Whole-fat or sweetened yogurt. Full-fat cheeses or blue cheese. Nondairy creamers and whipped toppings. Processed cheese, cheese spreads, or cheese curds. Condiments Onion and garlic salt, seasoned salt, table salt, and sea salt. Canned and packaged gravies. Worcestershire sauce. Tartar sauce. Barbecue sauce. Teriyaki sauce. Soy sauce, including reduced sodium. Steak sauce. Fish sauce. Oyster sauce. Cocktail sauce. Horseradish. Ketchup and mustard. Meat flavorings and tenderizers. Bouillon cubes. Hot sauce. Tabasco sauce. Marinades. Taco seasonings. Relishes. Fats and Oils Butter, stick margarine, lard, shortening, ghee, and bacon fat. Coconut, palm kernel, or palm oils. Regular salad dressings. Other Pickles and olives. Salted popcorn and pretzels. The items listed above may not be a complete list of foods and beverages to avoid. Contact your dietitian for more information. WHERE CAN I FIND MORE INFORMATION? National Heart, Lung, and Blood Institute: travelstabloid.com Document Released: 04/22/2011 Document Revised: 09/17/2013 Document Reviewed: 03/07/2013 [facility] Patient Information 2015 [place], [place]. This information  is not intended to replace advice given to you by your health care provider. Make sure you discuss any questions you have with your health care provider.      Semaglutide Injection (Weight Management) What is this medication? SEMAGLUTIDE (Sem a GLOO tide) is used to help people lose weight and maintainweight loss. It is used with a reduced-calorie diet and exercise. This medicine may be used for other purposes; ask your  health care provider orpharmacist if you have questions. COMMON BRAND NAME(S): [id number] What should I tell my care team before I take this medication? They need to know if you have any of these conditions: endocrine tumors (MEN 2) or if someone in your family had these tumors eye disease, vision problems gallbladder disease history of depression or mental health disease history of pancreatitis kidney disease stomach or intestine problems suicidal thoughts, plans, or attempt; a previous suicide attempt by you or a family member thyroid cancer or if someone in your family had thyroid cancer an unusual or allergic reaction to semaglutide, other medicines, foods, dyes, or preservatives pregnant or trying to get pregnant breast-feeding How should I use this medication? This medicine is injected under the skin. You will be taught how to prepare and give it. Take it as directed on the prescription label. It is given once every week (every 7 days). Keep taking it unless your health care provider tells youto stop. It is important that you put your used needles and pens in a special sharps container. Do not put them in a trash can. If you do not have a sharpscontainer, call your pharmacist or health care provider to get one. A special MedGuide will be given to you by the pharmacist with eachprescription and refill. Be sure to read this information carefully each time. This medicine comes with INSTRUCTIONS FOR USE. Ask your pharmacist for directions on how to use this medicine. Read the information carefully. Talk toyour pharmacist or health care provider if you have questions. Talk to your health care provider about the use of this medicine in children.Special care may be needed. Overdosage: If you think you have taken too much of this medicine contact apoison control center or emergency room at once. NOTE: This medicine is only for you. Do not share this medicine with others. What if I miss a dose? If  you miss a dose and the next scheduled dose is more than 2 days away, take the missed dose as soon as possible. If you miss a dose and the next scheduled dose is less than 2 days away, do not take the missed dose. Take the next dose at your regular time. Do not take double or extra doses. If you miss your dose for 2 weeks or more, take the next dose at your regular time or call yourhealth care provider to talk about how to restart this medicine. What may interact with this medication? insulin and other medicines for diabetes This list may not describe all possible interactions. Give your health care provider a list of all the medicines, herbs, non-prescription drugs, or dietary supplements you use. Also tell them if you smoke, drink alcohol, or use illegaldrugs. Some items may interact with your medicine. What should I watch for while using this medication? Visit your health care provider for regular checks on your progress. It may besome time before you see the benefit from this medicine. Drink plenty of fluids while taking this medicine. Check with your health care provider if you have severe diarrhea,  nausea, and vomiting, or if you sweat a lot. The loss of too much body fluid may make it dangerous for you to take thismedicine. This medicine may affect blood sugar levels. Ask your health care provider ifchanges in diet or medicines are needed if you have diabetes. If you or your family notice any changes in your behavior, such as new or worsening depression, thoughts of harming yourself, anxiety, other unusual ordisturbing thoughts, or memory loss, call your health care provider right away. Women should inform their health care provider if they wish to become pregnant or think they might be pregnant. Losing weight while pregnant is not advised and may cause harm to the unborn child. Talk to your health care provider formore information. What side effects may I notice from receiving this  medication? Side effects that you should report to your doctor or health care professionalas soon as possible: allergic reactions like skin rash, itching or hives, swelling of the face, lips, or tongue changes in vision fast heartbeat gallbladder problems (fever, upper belly pain, yellowing of the eyes or skin, clay-colored stool) kidney injury (trouble passing urine or change in the amount of urine) low blood sugar (feeling anxious; confusion; dizziness; increased hunger; unusually weak or tired; increased sweating; shakiness; cold, clammy skin; irritable; headache; blurred vision; loss of consciousness) lump or swelling on the neck pancreatitis (stomach pain that spreads to your back or gets worse after eating or when touched, fever, nausea, vomiting) suicidal thoughts, mood changes trouble breathing trouble swallowing Side effects that usually do not require medical attention (report these toyour doctor or health care professional if they continue or are bothersome): constipation diarrhea headache heartburn (burning feeling in chest, often after eating or when lying down) nausea pain, redness, or irritation at site where injected passing gas upset stomach vomiting This list may not describe all possible side effects. Call your doctor for medical advice about side effects. You may report side effects to FDA at1-800-FDA-1088. Where should I keep my medication? Keep out of the reach of children and pets. Refrigeration (preferred): Store in the refrigerator. Do not freeze. Keep this medicine in the original container until you are ready to take it. Get rid ofany unused medicine after the expiration date. Room temperature: If needed, prior to cap removal, the pen can be stored at room temperature for up to 28 days. Protect from light. If it is stored at room temperature, get rid of any unused medicine after 28 days or after it expires,whichever is first. It is important to get rid of the  medicine as soon as you no longer need it orit is expired. You can do this in two ways: Take the medicine to a medicine take-back program. Check with your pharmacy or law enforcement to find a location. If you cannot return the medicine, follow the directions in the [place]. NOTE: This sheet is a summary. It may not cover all possible information. If you have questions about this medicine, talk to your doctor, pharmacist, orhealth care provider.  2022 [employer] (2019-10-23 11:38:13)

## 2020-10-29 NOTE — Progress Notes (Signed)
FOLLOW UP 3 MONTHS  Assessment and Plan:   Essential hypertension She has been persistently elevated last few checks Extended discussion about risks/of htn and med options She would like to work on intentional lifestyle/weight loss and do close follow up, declines to start medication today Will follow up in 4-6 weeks Monitor blood pressure at home; call if consistently over 130/80 Reviewed DASH diet.   Reminder to go to the ER if any CP, SOB, nausea, dizziness, severe HA, changes vision/speech, left arm numbness and tingling and jaw pain.  Hyperlipidemia, unspecified hyperlipidemia type Managed by lifestyle modification at this time LDL goal <100 Decrease cholesterol, saturated fat; increase soluble fiber increase activity.  -     Lipid panel  Systemic lupus erythematosus, unspecified SLE type, unspecified organ involvement status ([place]) [name] following; doing well on imuran  Abnormal glucose (prediabetes) Discussed disease and risks Discussed diet/exercise, weight management  Check A1C q21m; CMP for glucose otherwise Consider ozempic/wegovy for weight loss and glucose   CKD III (HCC) Increase fluids, avoid NSAIDS, monitor sugars Check renal functions at each visit       -     CMP/GFR  Morbid Obesity ([place]) - BMI 30+ with OSA  - follow up 3 months for progress monitoring - increase veggies, decrease carbs - long discussion about weight loss, diet, and exercise - has failed phentermine, topiramate, wellbutrin - inadequate benefit with lifestyle interventions - did recommend stop ice cream, increase plant based foods high in fiber, monitor portions - will try semaglutide after discussion; out of samples, will call when available and do first injection by NV, then 4-6 week follow up  Medication management -     Magnesium  Recurrent major depressive disorder, in partial remission (HCC)/xanax - continue medications, try to limit xanax use  stress management techniques  discussed, increase water, good sleep hygiene discussed, increase exercise, and increase veggies.    Vitamin D deficiency Continue supplementation to maintain goal of 70-100 Taking Vitamin D  IU daily Defer vitamin D level  Bacterial conjunctivitis - Cipro eye drops sent in  Hygiene explained, if symptoms increase Eye Doctor ASAP   Further disposition pending results if labs check today. Discussed med's effects and SE's.   Over 30 minutes of face to face interview, exam, counseling, chart review, and critical decision making was performed.    Future Appointments  Date Time Provider [facility]  12/03/2020 10:40 AM [name], MD [facility]  02/18/2021 10:00 AM [name], NP [facility] None  10/01/2021  8:00 AM Lomax, Amy, NP [facility] None    HPI 64 y.o. AA female patient presents for 3 month follow up on htn, hyperlipidemia, glucose, morbid obesity and vitamin D Def.   She was recently seen at [facility] 10/19/2020 for bil eye redness and discharge, dx with bacterial conjunctivitic, mildly improved but not resolving.   She has rectocele, stress incontinence, pelvic pain episodes, chronic constipation. Suspected element of pelvic dysfunction, was recently referred to a local uro/gyn Dr. [name] by [name] for her input and repeat pelvic floor PT. She has been on linzess, miralax for constipation.   She has SLE and follows with [name], recently changed therapy from plaquenil to imuran and notes improvement with this med. 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   She has depression and anxiety, on lexapro/wellbutrin, was following with [name].  She is feeling that she is doing better with her anxiety. Reports xanax PRN, 3 days a  week.   BMI is Body mass index is 38.92 kg/m., she is working on diet.  Exercises limited by pelvic discomfort Formerly on phentermine/topamax, lack of benefit and stopped.  Reports 1 gallon water Yogurt  with nuts and protein powder Salad with chicken or beans No fried foods Chicken with veggies for dinner  Wt Readings from Last 3 Encounters:  10/29/20 206 lb (93.4 kg)  09/25/20 204 lb (92.5 kg)  08/20/20 204 lb (92.5 kg)    She has not been checking BP at home but notes elevated at last few OVs, today their BP is BP: (!) 152/80  She does not workout. She denies chest pain, shortness of breath, dizziness.  Patient's cholesterol is diet controlled. The patient's cholesterol last visit was  Lab Results  Component Value Date   CHOL 167 05/27/2020   HDL 38 (L) 05/27/2020   LDLCALC 104 (H) 05/27/2020   TRIG 145 05/27/2020   CHOLHDL 4.4 05/27/2020   The patient has been working on diet for prediabetes, denies changes in vision, polys, and paresthesias. Last A1C in office was Lab Results  Component Value Date   HGBA1C 6.1 (H) 05/27/2020   She underwent left laparoscopic nephrectomy by Dr. Link Snuffer on 09/19/2017 for left renal mass and carcinoma, followed by provoked PE, off of anticoagulation. She now has CKD III that is stable.  Lab Results  Component Value Date   GFRAA 22 (L) 05/27/2020   Vitamin D deficiency, on vitamin D Lab Results  Component Value Date   VD25OH 58 10/16/2019      Current Medications:      Current Outpatient Medications (Analgesics):    acetaminophen (TYLENOL) 500 MG tablet, Take 1,000 mg by mouth every 6 (six) hours as needed for mild pain.   Current Outpatient Medications (Other):    ALPRAZolam (XANAX) 0.5 MG tablet, TAKE 1/2 - 1 TABLET 1 TO 2 TIMES A DAY ONLY IF NEEDED FOR PANIC OR ANXIETY ATTACK & LIMIT TO 5 DAYS /WEEK TO AVOID ADDICTION & DEMENTIA   azaTHIOprine (IMURAN) 50 MG tablet, Take 50 mg by mouth 2 (two) times daily.   buPROPion (WELLBUTRIN XL) 150 MG 24 hr tablet, TAKE 1 TABLET EVERY MORNING FOR MOOD, FOCUS & CONCENTRATION   Cholecalciferol (VITAMIN D) 2000 UNITS CAPS, Take 1 capsule by mouth daily.   escitalopram (LEXAPRO) 20 MG  tablet, Take  1.5 tablets (30 mg)  Daily  for Mood & Chronic Anxiety   linaclotide (LINZESS) 290 MCG CAPS capsule, Take 290 mcg by mouth daily as needed (constipation).    Magnesium 500 MG TABS, Take 500 mg by mouth daily.    polyethylene glycol (MIRALAX / GLYCOLAX) 17 g packet, Take 17 g by mouth in the morning, at noon, and at bedtime.   trimethoprim-polymyxin b (POLYTRIM) ophthalmic solution, Place 1 drop into both eyes every 4 (four) hours. (Patient not taking: Reported on 10/29/2020)   Medical History:  Past Medical History:  Diagnosis Date   Anxiety    Chronic constipation    followed by dr Carlean Purl   CKD (chronic kidney disease), stage III ([place])    Enterocele    History of cervical cancer    hx squamous cell cervical carcinoma,   s/p  radical hysterectomy 11-24-1999   History of MRSA infection    2005--- buttock abscess   History of pulmonary embolus (PE)    post op 10-09-2017 (surgery on 09-19-2017)  lower range pulmonary artery,  resolved and completed 6 month Eliquis   History  of renal cell carcinoma    s/p  left nephrectomy 09-19-2017  (oncologist -- dr Alen Blew / urologsit-- dr bell)   History of seizure    10/ 2001  one episode per discharge note in epic, 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 (pt had just been dx with lupus);  (05-21-2019 per pt none since)   Incisional hernia    MDD (major depressive disorder)    OA (osteoarthritis)    OSA on CPAP    followed by dr dohmeier   Pre-diabetes    Rectocele    RLS (restless legs syndrome) 01/24/2015   Per sleep study 05/2014    Solitary right kidney    acquired 09-19-2017 s/p left nephrectomy   SUI (stress urinary incontinence, female)    05-21-2019  improved with therapy   Systemic lupus erythematosus ([place])    rheumotologist--- dr Amil Amen (lov 03-19-2019 in epic)   Vitamin D deficiency    Wears glasses    Allergies Allergies  Allergen Reactions   Bactrim [Sulfamethoxazole-Trimethoprim]      GI upset   Cymbalta [Duloxetine Hcl]     Sweating   Acyclovir And Related Itching    SURGICAL HISTORY She  has a past surgical history that includes Cesarean section (x3   last one 1980); Laparoscopic nephrectomy, hand assisted (Left, 09/19/2017); Colonoscopy; EXPLORATORY LAPAROTOMY MODIFIED RADICAL HYSTERECTOMY WITH PELVIC LYMPHADENECTOMY AND RESECTION MECKEL'S DIVERTICULUM (11-24-1999 dr Aldean Ast  @[facility] ); COLD KNIFE CERVICAL CONIZATION  (10/07/1999  @[facility] ); and Incisional hernia repair (N/A, 05/23/2019). FAMILY HISTORY Her family history includes Heart disease in her mother; Hyperlipidemia in her mother; Hypertension in her mother. SOCIAL HISTORY She  reports that she has never smoked. She has never used smokeless tobacco. She reports that she does not drink alcohol and does not use drugs.  Review of Systems  Constitutional:  Negative for malaise/fatigue and weight loss.  HENT:  Negative for hearing loss and tinnitus.   Eyes:  Negative for blurred vision and double vision.  Respiratory:  Negative for cough, shortness of breath and wheezing.   Cardiovascular:  Negative for chest pain, palpitations, orthopnea, claudication and leg swelling.  Gastrointestinal:  Positive for constipation (improved with meds). Negative for abdominal pain, blood in stool, diarrhea, heartburn, melena, nausea and vomiting.  Genitourinary:  Negative for dysuria, frequency, hematuria and urgency.       Pelvic pain, chronic  Musculoskeletal:  Negative for joint pain and myalgias.       Stiffness  Skin:  Negative for rash.  Neurological:  Negative for dizziness, tingling (improved with new SLE med), sensory change, weakness and headaches.  Endo/Heme/Allergies:  Negative for polydipsia.  Psychiatric/Behavioral:  Negative for depression and substance abuse. The patient is nervous/anxious. The patient does not have insomnia.   All other systems reviewed and are negative.  Physical Exam: Estimated body mass index is  38.92 kg/m as calculated from the following:   Height as of 09/25/20: 5\' 1"  (1.549 m).   Weight as of this encounter: 206 lb (93.4 kg). Vitals:   10/29/20 1122  BP: (!) 152/80  Temp: (!) 96.6 F (35.9 C)   General Appearance: Well nourished, in no apparent distress. Eyes: PERRLA, EOMs, conjunctiva bil without swelling but erythmatous, mild crusting discharge bil Sinuses: No Frontal/maxillary tenderness ENT/Mouth: Ext aud canals clear, normal light reflex with TMs without erythema, bulging.  Good dentition. No erythema, swelling, or exudate on post pharynx. Tonsils not swollen or erythematous. Hearing normal. Crowded mouth.  Neck: Supple, thyroid normal. No  bruits Respiratory: Respiratory effort normal, BS equal bilaterally without rales, rhonchi, wheezing or stridor. Cardio: RRR without murmurs, rubs or gallops. Brisk peripheral pulses without edema.  Abdomen: Soft, obese +BS. Non tender, no guarding, rebound, masses, or organomegaly. Very small ventral hernia along well healed vertical abdominal scar, easily retractable.  Lymphatics: Non tender without lymphadenopathy.  Musculoskeletal: Full ROM all peripheral extremities,5/5 strength, and normal gait. Skin: Warm, dry without rashes, lesions, ecchymosis.  Neuro: Cranial nerves intact, reflexes equal bilaterally. Normal muscle tone, no cerebellar symptoms. Sensation intact.  Psych: Awake and oriented X 3, normal affect, Insight and Judgment appropriate.   Izora Ribas, NP 11:47 AM [facility]

## 2020-10-30 LAB — CBC WITH DIFFERENTIAL/PLATELET
Absolute Monocytes: 466 cells/uL (ref 200–950)
Basophils Absolute: 30 cells/uL (ref 0–200)
Basophils Relative: 0.8 %
Eosinophils Absolute: 118 cells/uL (ref 15–500)
Eosinophils Relative: 3.2 %
HCT: 41.3 % (ref 35.0–45.0)
Hemoglobin: 13.5 g/dL (ref 11.7–15.5)
Lymphs Abs: 1347 cells/uL (ref 850–3900)
MCH: 29.9 pg (ref 27.0–33.0)
MCHC: 32.7 g/dL (ref 32.0–36.0)
MCV: 91.4 fL (ref 80.0–100.0)
MPV: 9.7 fL (ref 7.5–12.5)
Monocytes Relative: 12.6 %
Neutro Abs: 1739 cells/uL (ref 1500–7800)
Neutrophils Relative %: 47 %
Platelets: 270 10*3/uL (ref 140–400)
RBC: 4.52 10*6/uL (ref 3.80–5.10)
RDW: 13 % (ref 11.0–15.0)
Total Lymphocyte: 36.4 %
WBC: 3.7 10*3/uL — ABNORMAL LOW (ref 3.8–10.8)

## 2020-10-30 LAB — COMPLETE METABOLIC PANEL WITH GFR
AG Ratio: 1.1 (calc) (ref 1.0–2.5)
ALT: 10 U/L (ref 6–29)
AST: 15 U/L (ref 10–35)
Albumin: 4 g/dL (ref 3.6–5.1)
Alkaline phosphatase (APISO): 48 U/L (ref 37–153)
BUN: 17 mg/dL (ref 7–25)
CO2: 24 mmol/L (ref 20–32)
Calcium: 9.1 mg/dL (ref 8.6–10.4)
Chloride: 104 mmol/L (ref 98–110)
Creat: 0.94 mg/dL (ref 0.50–0.99)
GFR, Est African American: 74 mL/min/{1.73_m2} (ref >=60)
GFR, Est Non African American: 64 mL/min/{1.73_m2} (ref >=60)
Globulin: 3.5 g/dL (calc) (ref 1.9–3.7)
Glucose, Bld: 82 mg/dL (ref 65–99)
Potassium: 4.6 mmol/L (ref 3.5–5.3)
Sodium: 135 mmol/L (ref 135–146)
Total Bilirubin: 0.4 mg/dL (ref 0.2–1.2)
Total Protein: 7.5 g/dL (ref 6.1–8.1)

## 2020-10-30 LAB — TSH: TSH: 0.99 mIU/L (ref 0.40–4.50)

## 2020-10-30 LAB — LIPID PANEL
Cholesterol: 194 mg/dL (ref <200)
HDL: 28 mg/dL — ABNORMAL LOW (ref >=50)
LDL Cholesterol (Calc): 130 mg/dL (calc) — ABNORMAL HIGH
Non-HDL Cholesterol (Calc): 166 mg/dL (calc) — ABNORMAL HIGH (ref <130)
Total CHOL/HDL Ratio: 6.9 (calc) — ABNORMAL HIGH (ref <5.0)
Triglycerides: 224 mg/dL — ABNORMAL HIGH (ref <150)

## 2020-10-30 LAB — MAGNESIUM: Magnesium: 2 mg/dL (ref 1.5–2.5)

## 2020-11-03 ENCOUNTER — Ambulatory Visit (INDEPENDENT_AMBULATORY_CARE_PROVIDER_SITE_OTHER): Payer: BC Managed Care – PPO

## 2020-11-03 ENCOUNTER — Other Ambulatory Visit: Payer: Self-pay

## 2020-11-03 DIAGNOSIS — Z79899 Other long term (current) drug therapy: Secondary | ICD-10-CM

## 2020-11-03 NOTE — Progress Notes (Signed)
Patient presents to the office for instruction on how to administer the Ozempic injectable medication. Demonstration done and patient followed directions and administered it without any complications. Patient is scheduled to come back in about 6 weeks for a follow up visit with a provider. Advised to call with any questions or concerns.

## 2020-11-27 ENCOUNTER — Telehealth: Payer: Self-pay

## 2020-11-27 NOTE — Telephone Encounter (Signed)
Attempt made to contact Beth [name] is a 65 y.o. female re: New pt Pre appt call to collect history information.  -Allergy -Medication -Confirm pharmacy -OB history   Pt was not available. LM on the VM for the patient to call back

## 2020-11-30 ENCOUNTER — Other Ambulatory Visit: Payer: Self-pay | Admitting: Adult Health

## 2020-12-01 NOTE — Telephone Encounter (Signed)
Beth [name] is a 65 y.o. female  was called back re: New pt Pre appt call to collect history information. Pt verified using 2 identifiers. Confirmation that I am speaking with the correct person.  Chart was updated: -Allergy -Medication -Confirm pharmacy -OB history  Pt was notified to arrive 15 min early and we will need a urine sample when she arrives. Pt verbalized understanding.

## 2020-12-02 NOTE — Progress Notes (Deleted)
[name]  Referring Provider: Gatha Mayer, MD PCP: [name], MD Date of Service: 12/03/2020  SUBJECTIVE Chief Complaint: No chief complaint on file.  History of Present Illness: Beth [name] is a 65 y.o. {ED SANE [phone] female seen in consultation at the request of [name] for evaluation of constipation and SUI.    Review of records significant for: Has had a defecography in 2008 which showed an enterocele and rectocele. Has chronic constipation, currently on Miralax.  Seen by Dr Zigmund Daniel at [facility] [place] and was noted to have high tone pelvic floor dysfunction and zanaflex was recommended.  Attended pelvic PT at [facility]  Urinary Symptoms: Leaks urine with cough/ sneeze, with urgency. SUI is worse Leaks 2 time(s) per days.  Pad use: 3 pads per day.   She is bothered by her UI symptoms. Has been on Myrbetriq previously, which helped but did not want to be on medication anymore.   Day time voids 4.  Nocturia: 4 times per night to void. Voiding dysfunction: she does not empty her bladder well.  does not use a catheter to empty bladder.  When urinating, she feels a weak stream, difficulty starting urine stream, and dribbling after finishing  UTIs: 1 UTI's in the last year.   Reports history of renal cell carcinoma with a left nephrectomy  Pelvic Organ Prolapse Symptoms:                  She Admits to a feeling of a bulge the vaginal area. It has been present for 15 years.  She Denies seeing a bulge.  This bulge is bothersome.  Bowel Symptom: Bowel movements: 1 time(s) per day Stool consistency: hard Straining: yes.  Splinting: yes.  Incomplete evacuation: yes.  She Admits to accidental bowel leakage / fecal incontinence  Occurred once with soft stool Bowel regimen: miralax Last colonoscopy: Date 11/06/13, Results negative  Sexual Function Sexually active: no.   Pelvic Pain Admits to  pelvic pain. This is the most bothersome symptom, feels mostly in her rectum. Has heaviness.  Location: vaginal Pain occurs: all the time Prior pain treatment: exercises/ physical therapy Improved by: rest Worsened by: walking or standing long periods of time Has done pelvic PT twice but did not have any benefit.    Past Medical History:  Past Medical History:  Diagnosis Date   Anxiety    Chronic constipation    followed by dr Carlean Purl   CKD (chronic kidney disease), stage III ([place])    Enterocele    History of cervical cancer    hx squamous cell cervical carcinoma,   s/p  radical hysterectomy 11-24-1999   History of MRSA infection    2005--- buttock abscess   History of pulmonary embolus (PE)    post op 10-09-2017 (surgery on 09-19-2017)  lower range pulmonary artery,  resolved and completed 6 month Eliquis   History of renal cell carcinoma    s/p  left nephrectomy 09-19-2017  (oncologist -- dr Alen Blew / urologsit-- dr bell)   History of seizure    10/ 2001  one episode per discharge note in epic, 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 (pt had just been dx with lupus);  (05-21-2019 per pt none since)   Incisional hernia    MDD (major depressive disorder)    OA (osteoarthritis)    OSA on CPAP    followed by dr dohmeier   Pre-diabetes    Rectocele  RLS (restless legs syndrome) 01/24/2015   Per sleep study 05/2014    Solitary right kidney    acquired 09-19-2017 s/p left nephrectomy   SUI (stress urinary incontinence, female)    05-21-2019  improved with therapy   Systemic lupus erythematosus ([place])    rheumotologist--- dr Amil Amen (lov 03-19-2019 in epic)   Vitamin D deficiency    Wears glasses      Past Surgical History:   Past Surgical History:  Procedure Laterality Date   CESAREAN SECTION  x3   last one [place]   10/07/1999  @[facility]    COLONOSCOPY     negative per pt, Dr. Alice Reichert - [facility]   EXPLORATORY LAPAROTOMY  MODIFIED RADICAL HYSTERECTOMY WITH PELVIC LYMPHADENECTOMY AND RESECTION MECKEL'S DIVERTICULUM  11-24-1999 dr Aldean Ast  @[facility]    [place] N/A 05/23/2019   Procedure: LAPAROSCOPIC INCISIONAL HERNIA REPAIR WITH MESH;  Surgeon: Kinsinger, Arta Bruce, MD;  Location: [place];  Service: General;  Laterality: N/A;   LAPAROSCOPIC NEPHRECTOMY, HAND ASSISTED Left 09/19/2017   Procedure: LEFT HAND ASSISTED LAPAROSCOPIC NEPHRECTOMY;  Surgeon: Lucas Mallow, MD;  Location: [facility];  Service: Urology;  Laterality: Left;     Past OB/GYN History: G3 P3 Vaginal deliveries: 0,  Forceps/ Vacuum deliveries: 0, Cesarean section: 0 Menopausal: Yes, Denies vaginal bleeding since menopause Any history of abnormal pap smears: no.   Medications: She has a current medication list which includes the following prescription(s): acetaminophen, alprazolam, azathioprine, bupropion, vitamin d, escitalopram, linaclotide, magnesium, and polyethylene glycol.   Allergies: Patient is allergic to bactrim [sulfamethoxazole-trimethoprim], cymbalta [duloxetine hcl], and acyclovir and related.   Social History:  Social History   Tobacco Use   Smoking status: Never   Smokeless tobacco: Never  Vaping Use   Vaping Use: Never used  Substance Use Topics   Alcohol use: No   Drug use: Never    Relationship status: single  Family History:   Family History  Problem Relation Age of Onset   Hyperlipidemia Mother    Hypertension Mother    Heart disease Mother      Review of Systems: ROS   OBJECTIVE Physical Exam: There were no vitals filed for this visit.  Physical Exam   GU / Detailed Urogynecologic Evaluation:  Pelvic Exam: Normal external female genitalia; Bartholin's and Skene's glands normal in appearance; urethral meatus normal in appearance, no urethral masses or discharge.   CST: {gen negative/positive:315881}  Reflexes: bulbocavernosis {DESC; PRESENT/NOT  PRESENT:21021351}, anocutaneous {DESC; PRESENT/NOT PRESENT:21021351} ***bilaterally.  Speculum exam reveals normal vaginal mucosa {With/Without:20273} atrophy. Cervix {exam; gyn cervix:30847}. Uterus {exam; pelvic uterus:30849}. Adnexa {exam; adnexa:12223}.    s/p hysterectomy: Speculum exam reveals normal vaginal mucosa {With/Without:20273}  atrophy and normal vaginal cuff.  Adnexa {exam; adnexa:12223}.    With apex supported, anterior compartment defect was {reduced:24765}  Pelvic floor strength {Roman # I-V:19040}/V, puborectalis {Roman # I-V:19040}/V external anal sphincter {Roman # I-V:19040}/V  Pelvic floor musculature: Right levator {Tender/Non-tender:20250}, Right obturator {Tender/Non-tender:20250}, Left levator {Tender/Non-tender:20250}, Left obturator {Tender/Non-tender:20250}  POP-Q:   POP-Q                                               Aa  Ba                                                 C                                                Gh                                               Pb                                               tvl                                                Ap                                               Bp                                                 D     Rectal Exam:  Normal sphincter tone, {rectocele:24766} distal rectocele, enterocoele {DESC; PRESENT/NOT PRESENT:21021351}, no rectal masses, {sign of:24767} dyssynergia when asking the patient to bear down.  Post-Void Residual (PVR) by Bladder Scan: In order to evaluate bladder emptying, we discussed obtaining a postvoid residual and she agreed to this procedure.  Procedure: The ultrasound unit was placed on the patient's abdomen in the suprapubic region after the patient had voided. A PVR of 105 ml was obtained by bladder scan.  Laboratory Results: @ENCLABS @   ***I visualized the urine specimen, noting the specimen to be  {urine color:24768}  ASSESSMENT AND PLAN Ms. Prehn is a 65 y.o. with: No diagnosis found.    [name], MD   Medical Decision Making:  - Reviewed/ ordered a clinical laboratory test - Reviewed/ ordered a radiologic study - Reviewed/ ordered medicine test - Decision to obtain old records - Discussion of management of or test interpretation with an external physician / other healthcare professional  - Assessment requiring independent historian - Review and summation of prior records - Independent review of image, tracing or specimen

## 2020-12-03 ENCOUNTER — Ambulatory Visit (INDEPENDENT_AMBULATORY_CARE_PROVIDER_SITE_OTHER): Payer: BC Managed Care – PPO | Admitting: Obstetrics and Gynecology

## 2020-12-03 ENCOUNTER — Encounter: Payer: Self-pay | Admitting: Obstetrics and Gynecology

## 2020-12-03 ENCOUNTER — Encounter: Payer: BC Managed Care – PPO | Admitting: Obstetrics and Gynecology

## 2020-12-03 ENCOUNTER — Other Ambulatory Visit: Payer: Self-pay

## 2020-12-03 VITALS — BP 128/79 | HR 65 | Ht 61.5 in | Wt 201.0 lb

## 2020-12-03 VITALS — BP 128/79 | HR 65 | Ht 61.5 in

## 2020-12-03 DIAGNOSIS — R350 Frequency of micturition: Secondary | ICD-10-CM

## 2020-12-03 DIAGNOSIS — M62838 Other muscle spasm: Secondary | ICD-10-CM | POA: Diagnosis not present

## 2020-12-03 DIAGNOSIS — R3915 Urgency of urination: Secondary | ICD-10-CM | POA: Diagnosis not present

## 2020-12-03 DIAGNOSIS — N393 Stress incontinence (female) (male): Secondary | ICD-10-CM | POA: Diagnosis not present

## 2020-12-03 LAB — POCT URINALYSIS DIPSTICK
Appearance: ABNORMAL
Bilirubin, UA: NEGATIVE
Blood, UA: NEGATIVE
Glucose, UA: NEGATIVE
Ketones, UA: NEGATIVE
Nitrite, UA: NEGATIVE
Protein, UA: POSITIVE — AB
Spec Grav, UA: 1.01 (ref 1.010–1.025)
Urobilinogen, UA: 0.2 E.U./dL
pH, UA: 7 (ref 5.0–8.0)

## 2020-12-03 MED ORDER — DIAZEPAM 5 MG PO TABS: ORAL_TABLET | ORAL | 0 refills | Status: DC

## 2020-12-03 NOTE — Progress Notes (Signed)
[facility] New Patient Evaluation and Consultation  Referring Provider: Gatha Mayer, MD PCP: [name], MD Date of Service: 12/03/2020  SUBJECTIVE Chief Complaint: No chief complaint on file.  History of Present Illness: Beth [name] is a 65 y.o. Black or African-American female seen in consultation at the request of [name] for evaluation of constipation and SUI.    Review of records significant for: Has had a defecography in 2008 which showed an enterocele and rectocele. Has chronic constipation, currently on Miralax.  Seen by Dr Zigmund Daniel at [facility] and was noted to have high tone pelvic floor dysfunction and zanaflex was recommended.  Attended pelvic PT at [facility]  Urinary Symptoms: Leaks urine with cough/ sneeze, with urgency. SUI is worse Leaks 2 time(s) per days.  Pad use: 3 pads per day.   She is bothered by her UI symptoms. Has been on Myrbetriq previously, which helped but did not want to be on medication anymore.   Day time voids 4.  Nocturia: 4 times per night to void. Voiding dysfunction: she does not empty her bladder well.  does not use a catheter to empty bladder.  When urinating, she feels a weak stream, difficulty starting urine stream, and dribbling after finishing  UTIs: 1 UTI's in the last year.   Reports history of renal cell carcinoma with a left nephrectomy  Pelvic Organ Prolapse Symptoms:                  She Admits to a feeling of a bulge the vaginal area. It has been present for 15 years.  She Denies seeing a bulge.  This bulge is bothersome.  Bowel Symptom: Bowel movements: 1 time(s) per day Stool consistency: hard Straining: yes.  Splinting: yes.  Incomplete evacuation: yes.  She Admits to accidental bowel leakage / fecal incontinence  Occurred once with soft stool Bowel regimen: miralax Last colonoscopy: Date 11/06/13, Results negative  Sexual Function Sexually active: no.   Pelvic  Pain Admits to pelvic pain. This is the most bothersome symptom, feels mostly in her rectum. Has heaviness.  Location: vaginal Pain occurs: all the time Prior pain treatment: exercises/ physical therapy Improved by: rest Worsened by: walking or standing long periods of time Has done pelvic PT twice but did not have any benefit.    Past Medical History:  Past Medical History:  Diagnosis Date   Anxiety    Chronic constipation    followed by dr Carlean Purl   CKD (chronic kidney disease), stage III ([place])    Enterocele    History of cervical cancer    hx squamous cell cervical carcinoma,   s/p  radical hysterectomy 11-24-1999   History of MRSA infection    2005--- buttock abscess   History of pulmonary embolus (PE)    post op 10-09-2017 (surgery on 09-19-2017)  lower range pulmonary artery,  resolved and completed 6 month Eliquis   History of renal cell carcinoma    s/p  left nephrectomy 09-19-2017  (oncologist -- dr Alen Blew / urologsit-- dr bell)   History of seizure    10/ 2001  one episode per discharge note in epic, 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 (pt had just been dx with lupus);  (05-21-2019 per pt none since)   Incisional hernia    MDD (major depressive disorder)    OA (osteoarthritis)    OSA on CPAP    followed by dr dohmeier   Pre-diabetes    Rectocele  RLS (restless legs syndrome) 01/24/2015   Per sleep study 05/2014    Solitary right kidney    acquired 09-19-2017 s/p left nephrectomy   SUI (stress urinary incontinence, female)    05-21-2019  improved with therapy   Systemic lupus erythematosus ([place])    rheumotologist--- dr Amil Amen (lov 03-19-2019 in epic)   Vitamin D deficiency    Wears glasses      Past Surgical History:   Past Surgical History:  Procedure Laterality Date   CESAREAN SECTION  x3   last one [place]   10/07/1999  @[facility]    COLONOSCOPY     negative per pt, Dr. Alice Reichert - [facility]   EXPLORATORY  LAPAROTOMY MODIFIED RADICAL HYSTERECTOMY WITH PELVIC LYMPHADENECTOMY AND RESECTION MECKEL'S DIVERTICULUM  11-24-1999 dr Aldean Ast  @[facility]    [place] N/A 05/23/2019   Procedure: LAPAROSCOPIC INCISIONAL HERNIA REPAIR WITH MESH;  Surgeon: Kinsinger, Arta Bruce, MD;  Location: [facility];  Service: General;  Laterality: N/A;   LAPAROSCOPIC NEPHRECTOMY, HAND ASSISTED Left 09/19/2017   Procedure: LEFT HAND ASSISTED LAPAROSCOPIC NEPHRECTOMY;  Surgeon: Lucas Mallow, MD;  Location: [facility];  Service: Urology;  Laterality: Left;     Past OB/GYN History: G3 P3 Vaginal deliveries: 0,  Forceps/ Vacuum deliveries: 0, Cesarean section: 0 Menopausal: Yes, Denies vaginal bleeding since menopause Any history of abnormal pap smears: no.   Medications: She has a current medication list which includes the following prescription(s): acetaminophen, alprazolam, azathioprine, bupropion, vitamin d, escitalopram, linaclotide, magnesium, and polyethylene glycol.   Allergies: Patient is allergic to bactrim [sulfamethoxazole-trimethoprim], cymbalta [duloxetine hcl], and acyclovir and related.   Social History:  Social History   Tobacco Use   Smoking status: Never   Smokeless tobacco: Never  Vaping Use   Vaping Use: Never used  Substance Use Topics   Alcohol use: No   Drug use: Never    Relationship status: single  Family History:   Family History  Problem Relation Age of Onset   Hyperlipidemia Mother    Hypertension Mother    Heart disease Mother      Review of Systems: Review of Systems  Constitutional:  Negative for fever, malaise/fatigue and weight loss.  Respiratory:  Negative for cough, shortness of breath and wheezing.   Cardiovascular:  Negative for chest pain, palpitations and leg swelling.  Gastrointestinal:  Negative for abdominal pain and blood in stool.  Genitourinary:  Negative for dysuria.  Musculoskeletal:  Negative for myalgias.  Skin:  Negative  for rash.  Neurological:  Negative for dizziness and headaches.  Endo/Heme/Allergies:  Does not bruise/bleed easily.  Psychiatric/Behavioral:  Negative for depression. The patient is not nervous/anxious.      OBJECTIVE Physical Exam: Today's Vitals   12/03/20 1119  BP: 128/79  Pulse: 65  Weight: 201 lb (91.2 kg)  Height: 5' 1.5" (1.562 m)   Body mass index is 37.36 kg/m.  Physical Exam Constitutional:      General: She is not in acute distress. Pulmonary:     Effort: Pulmonary effort is normal.  Abdominal:     General: There is no distension.     Palpations: Abdomen is soft.     Tenderness: There is no abdominal tenderness. There is no rebound.  Musculoskeletal:        General: No swelling. Normal range of motion.  Skin:    General: Skin is warm and dry.     Findings: No rash.  Neurological:  Mental Status: She is alert and oriented to person, place, and time.  Psychiatric:        Mood and Affect: Mood normal.        Behavior: Behavior normal.    GU / Detailed Urogynecologic Evaluation:  Pelvic Exam: Normal external female genitalia; Bartholin's and Skene's glands normal in appearance; urethral meatus normal in appearance, no urethral masses or discharge.   With q-tip, tender at introitus at 10 o'clock, 2 o'clock and 6 o'clock.   CST: negative  Speculum exam reveals normal vaginal mucosa with atrophy. Cervix normal appearance. Uterus normal single, nontender. Adnexa no mass, fullness, tenderness.    Pelvic floor strength III/V  Pelvic floor musculature: Right levator tender, Right obturator tender, Left levator tender, Left obturator tender  POP-Q:  POP-Q  -3                                            Aa   -3                                           Ba  -6.5                                              C   1.5                                            Gh  4                                            Pb  7                                             tvl   -3                                            Ap  -3                                            Bp  -7                                              D       Rectal Exam:  Normal sphincter tone, small distal rectocele, enterocoele not present, no rectal masses, no sign of dyssynergia when asking the patient to bear down.  Post-Void Residual (PVR) by Bladder Scan: In order to evaluate bladder  emptying, we discussed obtaining a postvoid residual and she agreed to this procedure.  Procedure: The ultrasound unit was placed on the patient's abdomen in the suprapubic region after the patient had voided. A PVR of 105 ml was obtained by bladder scan.  Laboratory Results: POC urine: small leuk, neg nitrites  I visualized the urine specimen, noting the specimen to be clear yellow  ASSESSMENT AND PLAN Ms. Mangiapane is a 65 y.o. with:  Encounter Diagnoses  Name Primary?   Levator spasm Yes   Urinary frequency    Urinary urgency    SUI (stress urinary incontinence, female)    Levator spasm - The origin of pelvic floor muscle spasm can be multifactorial, including primary, reactive to a different pain source, trauma, or even part of a centralized pain syndrome.Treatment options include pelvic floor physical therapy, local (vaginal) or oral  muscle relaxants, pelvic muscle trigger point injections or centrally acting pain medications.   - She has fairly significant spasm and tenderness with palpation. Recommended treating pain first then working with physical therapy further. Prescribed vaginal valium 5mg  nightly for 2 weeks then as needed after. She will also consider trigger point injections. Referral placed for pelvic PT.  - No prolapse noted on exam today.   2. Incontinence - since pain is her most bothersome symptom, we did not fully address treatment options for incontinence. Will readdress in the future once she has had improvement with her levator spasm.    [name], MD   Medical Decision Making:  - Reviewed/ ordered a clinical laboratory test - Review and summation of prior records

## 2020-12-03 NOTE — Patient Instructions (Signed)
I will prescribe valium 5 mg pills to place vaginally up to 2 times a day for vaginal muscle spasms. Start at night and take one every night for the next several weeks to see if it improves your symptoms. Once you are improving you can taper off the medication and just use as needed. If the medication makes you drowsy then only use at bedtime and/or we can reduce the dose. Do not use gel to place it, as that will prevent the tablet from dissolving.  Instead place 1-2 drops of water on the tablet before inserting it in the vagina. If the pills do not dissolve well we can switch to a special compounded suppository. Let me know how you are doing on the medication and if you have any questions.

## 2020-12-04 ENCOUNTER — Encounter: Payer: Self-pay | Admitting: Obstetrics and Gynecology

## 2020-12-04 NOTE — Progress Notes (Signed)
This encounter was created in error - please disregard.

## 2020-12-05 LAB — URINE CULTURE

## 2020-12-09 ENCOUNTER — Other Ambulatory Visit: Payer: Self-pay | Admitting: Adult Health

## 2020-12-09 DIAGNOSIS — R7301 Impaired fasting glucose: Secondary | ICD-10-CM

## 2020-12-09 DIAGNOSIS — E8881 Metabolic syndrome: Secondary | ICD-10-CM

## 2020-12-09 MED ORDER — OZEMPIC (0.25 OR 0.5 MG/DOSE) 2 MG/1.5ML ~~LOC~~ SOPN: 0.5000 mg | PEN_INJECTOR | SUBCUTANEOUS | 0 refills | Status: DC

## 2020-12-16 ENCOUNTER — Encounter: Payer: BC Managed Care – PPO | Attending: Obstetrics and Gynecology | Admitting: Physical Therapy

## 2020-12-16 ENCOUNTER — Other Ambulatory Visit: Payer: Self-pay

## 2020-12-16 ENCOUNTER — Encounter: Payer: Self-pay | Admitting: Physical Therapy

## 2020-12-16 DIAGNOSIS — K6289 Other specified diseases of anus and rectum: Secondary | ICD-10-CM | POA: Diagnosis present

## 2020-12-16 DIAGNOSIS — R252 Cramp and spasm: Secondary | ICD-10-CM | POA: Insufficient documentation

## 2020-12-16 DIAGNOSIS — M6281 Muscle weakness (generalized): Secondary | ICD-10-CM | POA: Insufficient documentation

## 2020-12-16 DIAGNOSIS — R32 Unspecified urinary incontinence: Secondary | ICD-10-CM | POA: Insufficient documentation

## 2020-12-16 NOTE — Therapy (Signed)
[place] at [facility] [address] [place], [place], [address] Phone: [phone]   Fax:  [phone]  Physical Therapy Evaluation  Patient Details  Name: Beth [name] MRN: [id number] Date of Birth: 22-May-1955 Referring Provider (PT): Dr. [name]   Encounter Date: 12/16/2020   PT End of Session - 12/16/20 1638     Visit Number 1    Date for PT Re-Evaluation 04/17/21    Authorization Type BCBS    PT Start Time 1600    PT Stop Time 1655    PT Time Calculation (min) 55 min    Activity Tolerance Patient tolerated treatment well;No increased pain    Behavior During Therapy WFL for tasks assessed/performed             Past Medical History:  Diagnosis Date   Anxiety    Chronic constipation    followed by dr Carlean Purl   CKD (chronic kidney disease), stage III ([employer])    Enterocele    History of cervical cancer    hx squamous cell cervical carcinoma,   s/p  radical hysterectomy 11-24-1999   History of MRSA infection    2005--- buttock abscess   History of pulmonary embolus (PE)    post op 10-09-2017 (surgery on 09-19-2017)  lower range pulmonary artery,  resolved and completed 6 month Eliquis   History of renal cell carcinoma    s/p  left nephrectomy 09-19-2017  (oncologist -- dr Alen Blew / urologsit-- dr bell)   History of seizure    10/ 2001  one episode per discharge note in epic, 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 (pt had just been dx with lupus);  (05-21-2019 per pt none since)   Incisional hernia    MDD (major depressive disorder)    OA (osteoarthritis)    OSA on CPAP    followed by dr dohmeier   Pre-diabetes    Rectocele    RLS (restless legs syndrome) 01/24/2015   Per sleep study 05/2014    Solitary right kidney    acquired 09-19-2017 s/p left nephrectomy   SUI (stress urinary incontinence, female)    05-21-2019  improved with therapy   Systemic lupus erythematosus  ([place])    rheumotologist--- dr Amil Amen (lov 03-19-2019 in epic)   Vitamin D deficiency    Wears glasses     Past Surgical History:  Procedure Laterality Date   CESAREAN SECTION  x3   last one 1980   COLD KNIFE CERVICAL CONIZATION   10/07/1999  '@[facility]    COLONOSCOPY     negative per pt, Dr. Alice Reichert - [facility]   EXPLORATORY LAPAROTOMY MODIFIED RADICAL HYSTERECTOMY WITH PELVIC LYMPHADENECTOMY AND RESECTION MECKEL'S DIVERTICULUM  11-24-1999 dr Aldean Ast  '@[facility]'$    INCISIONAL HERNIA REPAIR N/A 05/23/2019   Procedure: LAPAROSCOPIC INCISIONAL HERNIA REPAIR WITH MESH;  Surgeon: Kinsinger, Arta Bruce, MD;  Location: [facility];  Service: General;  Laterality: N/A;   LAPAROSCOPIC NEPHRECTOMY, HAND ASSISTED Left 09/19/2017   Procedure: LEFT HAND ASSISTED LAPAROSCOPIC NEPHRECTOMY;  Surgeon: Lucas Mallow, MD;  Location: [facility];  Service: Urology;  Laterality: Left;    There were no vitals filed for this visit.    Subjective Assessment - 12/16/20 1611     Subjective Patient has been having heaviness in her vaginal and recctum. Patient has swelling in legs and feet. hs had the rectal ppain since she had a nurse assist her to have a BM because she was constipated. Taking the  valium in the vagina.    Patient Stated Goals reduce pain, improve balance, return to exercise    Currently in Pain? Yes    Pain Score 6     Pain Location Rectum    Pain Orientation Mid    Pain Descriptors / Indicators Pressure    Pain Type Chronic pain    Pain Onset More than a month ago    Pain Frequency Constant    Aggravating Factors  having a bowel movement    Pain Relieving Factors empty bowels    Multiple Pain Sites Yes    Pain Score 6    Pain Location Vagina    Pain Orientation Mid    Pain Descriptors / Indicators Heaviness;Constant   left is worse than the right   Pain Type Chronic pain    Pain Onset More than a month ago    Pain Frequency Constant    Aggravating Factors  walking, exercise    Pain  Relieving Factors sit and rest                [facility] PT Assessment - 12/16/20 0001       Assessment   Medical Diagnosis M62.838 levator ani spasm    Referring Provider (PT) Dr. [name]    Onset Date/Surgical Date --   1 year ago   Prior Therapy PT 2 times [facility] and [facility]      Precautions   Precautions Other (comment)    Precaution Comments reneal cell cancer      Restrictions   Weight Bearing Restrictions No      Balance Screen   Has the patient fallen in the past 6 months Yes    How many times? 1   slipped on water   Has the patient had a decrease in activity level because of a fear of falling?  Yes    Is the patient reluctant to leave their home because of a fear of falling?  Yes      [place] residence      Prior Function   Level of Independence Independent    Vocation Full time employment    Geneticist, molecular with sitting      Cognition   Overall Cognitive Status Within Functional Limits for tasks assessed      Observation/Other Assessments   Skin Integrity abdominal scars have decreased mobility      Posture/Postural Control   Posture/Postural Control Postural limitations    Postural Limitations Rounded Shoulders;Forward head      ROM / Strength   AROM / PROM / Strength AROM;PROM;Strength      AROM   Lumbar Extension decreased by 50%      PROM   Right Hip Extension 0    Right Hip External Rotation  45    Left Hip Extension 0    Left Hip External Rotation  50      Strength   Overall Strength Comments contract the abdominals and push them out with coning on the upper abdominal      Palpation   Palpation comment tenderness throughout the abdomen, lumbar, gluteal, levator ani      Standardized Balance Assessment   Standardized Balance Assessment Timed Up and Go Test;Five Times Sit to Stand    Five times sit to stand comments  13 sec      Timed Up and Go Test    TUG Normal TUG    Normal  TUG (seconds) 11                        Objective measurements completed on examination: See above findings.     Pelvic Floor Special Questions - 12/16/20 0001     Diagnostic Test/Procedure Performed Yes   not emptying her bladder completely   Prior Pregnancies Yes    Number of Pregnancies 3    Number of C-Sections 3    Currently Sexually Active No    Urinary Leakage Yes    Pad use 2-3 pads per day;    Activities that cause leaking With strong urge;Laughing;Coughing;Sneezing;Lifting;Walking    Urinary frequency night time bathroom 3x; day 4 times    Fecal incontinence --   strain ofr bowel movement, 1x/day, metamucil   Skin Integrity Intact;Other    Skin Integrity other dryness    Perineal Body/Introitus  Elevated;Other    Perineal Body/Introitus other tender    External Palpation tender in the ischiocavernosus, bulbocavernosus, perineal body, superior transverse    Pelvic Floor Internal Exam Patient confirms identification and approves PT to assess and treat the pelvic floor    Exam Type Vaginal;Rectal    Palpation tender in the levator ani, obturaotri internist    Strength weak squeeze, no lift   vaignal and rectal                     PT Education - 12/16/20 2016     Education Details educated patient on using coconut oil on the vulva area and into the vaginal canal daily.    Person(s) Educated Patient    Methods Explanation    Comprehension Verbalized understanding              PT Short Term Goals - 12/16/20 2025       PT SHORT TERM GOAL #1   Title indpendent with hip stretches; diaphragmatic breathing; and scar massage    Time 4    Period Weeks    Status New    Target Date 01/13/21      PT SHORT TERM GOAL #2   Title understand correct toileting techniques to reduce straining with bowel movements and empty the bowel completely    Time 4    Period Weeks    Status New    Target Date 01/13/21      PT  SHORT TERM GOAL #3   Title able to fully empty her bladder due to the ability to relax her pelvic floor muscles and understands techniques to assist in emptying    Time 4    Period Weeks    Status New    Target Date 01/13/21               PT Long Term Goals - 12/16/20 2028       PT LONG TERM GOAL #1   Title independent with advanced HEP for improve strength with a gentle exercise routine    Time 12    Period Weeks    Status New    Target Date 03/10/21      PT LONG TERM GOAL #2   Title understand ways to manage her pain with breathing, body scanning, and medtiation    Time 12    Period Weeks    Status New    Target Date 03/10/21      PT LONG TERM GOAL #3   Title able to have a complete bowel movement every 2-3 days with  pain decreased </= 1-2/10    Time 12    Period Weeks    Status New    Target Date 03/10/21      PT LONG TERM GOAL #4   Title urinary leakage with activity decreased >/= 50% due to the ability to fully relax the pelvic floor and contract >/= 3/5 strength    Time 12    Period Weeks    Status New    Target Date 03/10/21                    Plan - 12/16/20 1647     Clinical Impression Statement Patient is a 65 year old female with rectal and vaginal pain for the past year. Patient reports her rectal and vaginal pain is a 6/10 with walking, bowel movement, standing long periods of time and exercise. She will leak urine with coughing, sneezing, laughing, bending. She wears 2-3 pads per day. She goes to the bathroom 3 times at night and every 2-3 hours during the day. Patient has difficulty with starting a urine  stream that is weak and not able to fully empty her bladder. Patinet ahs 1 bowel movement per day but has to strain. Pelvic floor strength is 2/5 for the rectum and vaginal. She has tenderness located in the levator ani, obturator internist, perineal body, bulbocavernosus, ishciocavernosus, superior transverse perineum. She has vagianl  dryness and recommednded her to use coconut oil on the vulvar area and into the vaginal canal. Her abdominal scars are restricted. She has tenderness throughtout her abdomen, lumbar, gluteals, and around the sacrum. TUG score is 11 seconds. Sit to stand test is 13 seconds.  She does have a fear of falling due to her balance so an aquatic program will assist her with her mobility and not increase her pain. Bilaeral hip strength is 4-/5. Abdominal strength is 1/5. When she contracts her abdominals she will bulge the lower abdominals and cone the upper abdominals where she had the hernia repair. Patient reports swelling in her legs and feet. She has had a radiacal lymphodenecotmy on 11/24/1999 that may contribute to this. Patient has a distended abdomen. Patient will benefit from skilled therapy to reduce her pelvic pain so she will be able to exercise to improve her balance and core strength.    Personal Factors and Comorbidities Age;Comorbidity 3+;Fitness;Past/Current Experience;Time since onset of injury/illness/exacerbation    Comorbidities c-section x3; Hx of cervical cancer 11/24/1999; Hx of pulmonary emolus 10/09/2017; Hx o f renal cell cancer 09/19/2017; Lupus; insicional hernia repair 05/23/2019; radiacl lymphodenectomy 11/24/1999; cold knife cervical conizaaltion 10/07/1999    Examination-Activity Limitations Bed Mobility;Sit;Bend;Squat;Carry;Continence;Toileting;Lift;Locomotion Level;Transfers    Examination-Participation Restrictions Cleaning;Community Activity    Stability/Clinical Decision Making Evolving/Moderate complexity    Clinical Decision Making Moderate    Rehab Potential Good    PT Frequency 2x / week    PT Duration 12 weeks    PT Treatment/Interventions ADLs/Self Care Home Management;Aquatic Therapy;Electrical Stimulation;Therapeutic activities;Therapeutic exercise;Balance training;Neuromuscular re-education;Patient/family education;Manual techniques;Manual lymph drainage;Scar  mobilization;Passive range of motion;Dry needling;Spinal Manipulations;Joint Manipulations    PT Next Visit Plan hip stretches; manual work to the abdomen; diaphragmatic breathing; pelvic floor meditation; lymph drainage in the abdomen and legs; balance exercises; in aquatic therapy working on leg strength and balance    Consulted and Agree with Plan of Care Patient             Patient will benefit from skilled therapeutic intervention in order to improve the following deficits and impairments:  Decreased endurance, Decreased scar mobility, Decreased activity tolerance, Decreased strength, Increased fascial restricitons, Pain, Decreased balance, Decreased mobility, Increased muscle spasms, Difficulty walking, Decreased coordination, Decreased range of motion  Visit Diagnosis: Muscle weakness (generalized) - Plan: PT plan of care cert/re-cert  Cramp and spasm - Plan: PT plan of care cert/re-cert  Urinary incontinence, unspecified type - Plan: PT plan of care cert/re-cert  Rectal pain - Plan: PT plan of care cert/re-cert     Problem List Patient Active Problem List   Diagnosis Date Noted   Insulin resistance 12/09/2020   Stage 3a chronic kidney disease ([place]) 10/29/2020   OSA on CPAP 01/16/2019   Rectocele 12/04/2017   History of pulmonary embolus (PE) 10/09/2017   History of renal cell carcinoma 10/09/2017   H/O left nephrectomy 10/09/2017   Depression 10/09/2017   Anxiety 10/09/2017   RLS (restless legs syndrome) 01/24/2015   Morbid obesity ([place])- BMI 30+ with OSA 05/28/2014   SUI (stress urinary incontinence, female) 05/28/2014   Medication management 05/28/2014   Hypertension    Abnormal glucose (prediabetes)    Hyperlipidemia    Vitamin D deficiency    Environmental allergies 07/14/2006   Chronic constipation 07/14/2006   Lupus (systemic lupus erythematosus) ([place]) 07/14/2006    Earlie Counts, PT 12/16/20 8:34 PM  [place] at [facility] [address] [place], [place], [address] Phone: [phone]   Fax:  [phone]  Name: Beth [name] MRN: [id number] Date of Birth: 1956-01-29

## 2020-12-18 ENCOUNTER — Other Ambulatory Visit: Payer: Self-pay

## 2020-12-18 ENCOUNTER — Encounter: Payer: Self-pay | Admitting: Internal Medicine

## 2020-12-18 ENCOUNTER — Ambulatory Visit (INDEPENDENT_AMBULATORY_CARE_PROVIDER_SITE_OTHER): Payer: BC Managed Care – PPO | Admitting: Internal Medicine

## 2020-12-18 VITALS — BP 140/82 | HR 80 | Temp 97.3°F | Resp 16 | Ht 60.5 in | Wt 200.4 lb

## 2020-12-18 DIAGNOSIS — R7309 Other abnormal glucose: Secondary | ICD-10-CM | POA: Diagnosis not present

## 2020-12-18 DIAGNOSIS — E559 Vitamin D deficiency, unspecified: Secondary | ICD-10-CM | POA: Diagnosis not present

## 2020-12-18 DIAGNOSIS — I10 Essential (primary) hypertension: Secondary | ICD-10-CM | POA: Diagnosis not present

## 2020-12-18 DIAGNOSIS — E782 Mixed hyperlipidemia: Secondary | ICD-10-CM

## 2020-12-18 DIAGNOSIS — R7301 Impaired fasting glucose: Secondary | ICD-10-CM | POA: Diagnosis not present

## 2020-12-18 DIAGNOSIS — E8881 Metabolic syndrome: Secondary | ICD-10-CM

## 2020-12-18 DIAGNOSIS — Z79899 Other long term (current) drug therapy: Secondary | ICD-10-CM

## 2020-12-18 NOTE — Progress Notes (Signed)
Future Appointments  Date Time Provider [place]  12/18/2020 11:30 AM [name], MD [facility] None  01/02/2021  9:20 AM [name], MD [facility]  02/18/2021 10:00 AM [name], NP [facility] None  10/01/2021  8:00 AM [name], NP [facility] None    History of Present Illness:     This very nice 65 yo BF  with HTN, HLD, OSA/CPAP, CKD3, SLE, PreDiabetes /Insulin Resistance  and Morbid Obesity (BMI 38.9+) was started on Ozempic 0.5 mg on 11/03/2020.   Patient has lost 6 # since starting Ozempic. Her A1c was 6.3% in 2017 and have consistently been above 5.8% since 2015.   Wt Readings from Last 3 Encounters:  12/18/20 200 lb 6.4 oz (90.9 kg)  12/03/20 201 lb (91.2 kg)  10/29/20 206 lb (93.4 kg)    Medications    Semaglutide,0.25 or 0.'5MG'$ /DOS, (OZEMPIC, 0.25 OR 0.5 MG/DOSE,) 2 MG/1.5ML SOPN, Inject 0.5 mg into the skin once a week.   acetaminophen (TYLENOL) 500 MG tablet, Take 1,000 mg by mouth every 6 (six) hours as needed for mild pain.   ALPRAZolam (XANAX) 0.5 MG tablet, TAKE 1/2-1 TABLET 1-2 TIMES A DAY ONLY IF NEEDED FOR PANIC/ANXIETY ATTACK. LIMIT TO 5 DAYS/WEEK TO AVOID ADDICTION & DEMENTIA   azaTHIOprine (IMURAN) 50 MG tablet, Take 50 mg by mouth 2 (two) times daily.   buPROPion (WELLBUTRIN XL) 150 MG 24 hr tablet, TAKE 1 TABLET EVERY MORNING FOR MOOD, FOCUS & CONCENTRATION   Cholecalciferol (VITAMIN D) 2000 UNITS CAPS, Take 1 capsule by mouth daily.   diazepam (VALIUM) 5 MG tablet, Place 1 tablet vaginally nightly for two weeks then as needed.   escitalopram (LEXAPRO) 20 MG tablet, Take  1.5 tablets (30 mg)  Daily  for Mood & Chronic Anxiety   linaclotide (LINZESS) 290 MCG CAPS capsule, Take 290 mcg by mouth daily as needed (constipation).    Magnesium 500 MG TABS, Take 500 mg by mouth daily.    polyethylene glycol (MIRALAX / GLYCOLAX) 17 g packet, Take 17 g by mouth in the morning, at noon, and at bedtime.  Problem list She has Environmental  allergies; Chronic constipation; Lupus (systemic lupus erythematosus) ([place]); Hypertension; Abnormal glucose (prediabetes); Hyperlipidemia; Vitamin D deficiency; Morbid obesity ([place])- BMI 30+ with OSA; SUI (stress urinary incontinence, female); Medication management; RLS (restless legs syndrome); History of pulmonary embolus (PE); History of renal cell carcinoma; H/O left nephrectomy; Depression; Anxiety; Rectocele; OSA on CPAP; Stage 3a chronic kidney disease (HCC); and Insulin resistance on their problem list.   Observations/Objective:  BP 140/82   Pulse 80   Temp (!) 97.3 F (36.3 C)   Resp 16   Ht 5' 0.5" (1.537 m)   Wt 200 lb 6.4 oz (90.9 kg)   BMI 38.49 kg/m   HEENT - WNL. Neck - supple.  Chest - Clear equal BS. Cor - Nl HS. RRR w/o sig MGR. PP 1(+). No edema. MS- FROM w/o deformities.  Gait Nl. Neuro -  Nl w/o focal abnormalities.   Assessment and Plan:  1. Essential hypertension   2. Hyperlipidemia, mixed   3. Abnormal glucose  - Hemoglobin A1c - Insulin, random - Fructosamine  4. Impaired fasting glucose  - Hemoglobin A1c - Insulin, random - Fructosamine  5. Insulin resistance  - Insulin, random  6. Vitamin D deficiency  - VITAMIN D 25 Hydroxy   7. Medication management  - Hemoglobin A1c - Insulin, random - Fructosamine   Follow Up Instructions:  Patient relates that she is still taking samples of Ozempic & she is encouraged to call in 2 weeks to have next dose increase (1 mg) called in .         I discussed the assessment and treatment plan with the patient. The patient was provided an opportunity to ask questions and all were answered. The patient agreed with the plan and demonstrated an understanding of the instructions.       The patient was advised to call back or seek an in-person evaluation if the symptoms worsen or if the condition fails to improve as anticipated.   Kirtland Bouchard, MD

## 2020-12-19 NOTE — Progress Notes (Signed)
============================================================ -   Test results slightly outside the reference range are not unusual. If there is anything important, I will review this with you,  otherwise it is considered normal test values.  If you have further questions,  please do not hesitate to contact me at the office or via My Chart.  ============================================================ ============================================================  -  A1c = 6.0% - Blood sugar and A1c are STILL  elevated in the borderline and  early or pre-diabetes range which has the same   300% increased risk for heart attack, stroke, cancer and   alzheimer- type vascular dementia as full blown diabetes.   But the good news is that diet, exercise with  weight loss can cure the early diabetes at this point. ============================================================ ============================================================  -  It is very important that you work harder with diet by  avoiding all foods that are white except chicken,   fish & calliflower.  - Avoid white rice  (brown & wild rice is OK),   - Avoid white potatoes  (sweet potatoes in moderation is OK),   White bread or wheat bread or anything made out of   white flour like bagels, donuts, rolls, buns, biscuits, cakes,  - pastries, cookies, pizza crust, and pasta (made from  white flour & egg whites)   - vegetarian pasta or spinach or wheat pasta is OK.  - Multigrain breads like Arnold's, Pepperidge Farm or   multigrain sandwich thins or high fiber breads like   [place] bread or "Dave's Killer" breads that are  4 to 5 grams fiber per slice !  are best.   ============================================================ ============================================================  -  Vitamin D = 67  - Excellent   ============================================================ ============================================================  -  All Else - CBC - Kidneys - Electrolytes - Liver - Magnesium & Thyroid    - all  Normal / OK ============================================================ ============================================================  -

## 2020-12-20 NOTE — Patient Instructions (Signed)

## 2020-12-23 LAB — INSULIN, RANDOM: Insulin: 19.9 u[IU]/mL — ABNORMAL HIGH

## 2020-12-23 LAB — HEMOGLOBIN A1C
Hgb A1c MFr Bld: 6 % of total Hgb — ABNORMAL HIGH (ref <5.7)
Mean Plasma Glucose: 126 mg/dL
eAG (mmol/L): 7 mmol/L

## 2020-12-23 LAB — FRUCTOSAMINE: Fructosamine: 231 umol/L (ref 205–285)

## 2020-12-23 LAB — VITAMIN D 25 HYDROXY (VIT D DEFICIENCY, FRACTURES): Vit D, 25-Hydroxy: 67 ng/mL (ref 30–100)

## 2020-12-27 ENCOUNTER — Other Ambulatory Visit: Payer: Self-pay | Admitting: Internal Medicine

## 2020-12-27 DIAGNOSIS — F419 Anxiety disorder, unspecified: Secondary | ICD-10-CM

## 2020-12-27 DIAGNOSIS — F3341 Major depressive disorder, recurrent, in partial remission: Secondary | ICD-10-CM

## 2020-12-27 DIAGNOSIS — Z79899 Other long term (current) drug therapy: Secondary | ICD-10-CM

## 2020-12-29 ENCOUNTER — Encounter: Payer: Self-pay | Admitting: Obstetrics and Gynecology

## 2020-12-31 ENCOUNTER — Other Ambulatory Visit: Payer: Self-pay | Admitting: Obstetrics and Gynecology

## 2020-12-31 ENCOUNTER — Other Ambulatory Visit: Payer: Self-pay | Admitting: Adult Health

## 2020-12-31 DIAGNOSIS — E8881 Metabolic syndrome: Secondary | ICD-10-CM

## 2020-12-31 DIAGNOSIS — R7301 Impaired fasting glucose: Secondary | ICD-10-CM

## 2020-12-31 DIAGNOSIS — M62838 Other muscle spasm: Secondary | ICD-10-CM

## 2020-12-31 MED ORDER — OZEMPIC (1 MG/DOSE) 4 MG/3ML ~~LOC~~ SOPN: 1.0000 mg | PEN_INJECTOR | SUBCUTANEOUS | 0 refills | Status: DC

## 2021-01-01 ENCOUNTER — Other Ambulatory Visit: Payer: Self-pay | Admitting: Adult Health

## 2021-01-01 MED ORDER — OZEMPIC (0.25 OR 0.5 MG/DOSE) 2 MG/1.5ML ~~LOC~~ SOPN: PEN_INJECTOR | SUBCUTANEOUS | 3 refills | Status: DC

## 2021-01-01 NOTE — Progress Notes (Signed)
[facility] Return Visit  SUBJECTIVE  History of Present Illness: Beth [name] is a 65 y.o. female seen in follow-up for pelvic pain/ levator spasm and incontinence. Plan at last visit was to start vaginal valium. Had one appointment scheduled with PT which went well. Has more appts scheduled in   Pain has improved mostly. Was using the valium every night. But now having some pain in the rectal area- has an appointment with Dr Carlean Purl.    Still waking up 3-4 times per night. Stops drinking around 7-8 pm but then has a sip of water to take pills.  Urine stream is better. Has been on Myrbetriq before and wants to start again.   Also being prescribed xanax. Does not take it every day.   Past Medical History: Patient  has a past medical history of Anxiety, Chronic constipation, CKD (chronic kidney disease), stage III ([place]), Enterocele, History of cervical cancer, History of MRSA infection, History of pulmonary embolus (PE), History of renal cell carcinoma, History of seizure, Incisional hernia, MDD (major depressive disorder), OA (osteoarthritis), OSA on CPAP, Pre-diabetes, Rectocele, RLS (restless legs syndrome) (01/24/2015), Solitary right kidney, SUI (stress urinary incontinence, female), Systemic lupus erythematosus ([place]), Vitamin D deficiency, and Wears glasses.   Past Surgical History: She  has a past surgical history that includes Cesarean section (x3   last one 1980); Laparoscopic nephrectomy, hand assisted (Left, 09/19/2017); Colonoscopy; EXPLORATORY LAPAROTOMY MODIFIED RADICAL HYSTERECTOMY WITH PELVIC LYMPHADENECTOMY AND RESECTION MECKEL'S DIVERTICULUM (11-24-1999 dr Aldean Ast  '@[facility]'$ ); COLD KNIFE CERVICAL CONIZATION  (10/07/1999  '@[facility]'$ ); and Incisional hernia repair (N/A, 05/23/2019).   Medications: She has a current medication list which includes the following prescription(s): acetaminophen, alprazolam, azathioprine, bupropion, vitamin d, diazepam, escitalopram, linaclotide,  magnesium, mirabegron er, polyethylene glycol, and ozempic (0.25 or 0.5 mg/dose).   Allergies: Patient is allergic to bactrim [sulfamethoxazole-trimethoprim], cymbalta [duloxetine hcl], and acyclovir and related.   Social History: Patient  reports that she has never smoked. She has never used smokeless tobacco. She reports that she does not drink alcohol and does not use drugs.      OBJECTIVE     Physical Exam: Vitals:   01/02/21 0932  BP: 122/75  Pulse: 66  Weight: 203 lb (92.1 kg)   Gen: No apparent distress, A&O x 3.  Detailed Urogynecologic Evaluation:  Deferred.    ASSESSMENT AND PLAN    Beth [name] is a 65 y.o. with:  1. Levator spasm   2. Overactive bladder    Levator spasm - Has physical therapy scheduled - We discussed to limit the use of the vaginal valium and she should not take the valium on the same day she takes a xanax. Should limit to if her pelvic pain returns or prior to a PT session.   2. OAB - Has previously been on Myrbetriq which has helped her nighttime symptoms. Prescribed Myrbetriq '25mg'$  daily. If the medication is not covered, she will let us know and we can try an alternative.   Return 3 months   [name], MD  Time spent: I spent 20 minutes dedicated to the care of this patient on the date of this encounter to include pre-visit review of records, face-to-face time with the patient and post visit documentation and ordering medication/ testing.

## 2021-01-02 ENCOUNTER — Encounter: Payer: Self-pay | Admitting: Physical Therapy

## 2021-01-02 ENCOUNTER — Other Ambulatory Visit: Payer: Self-pay

## 2021-01-02 ENCOUNTER — Encounter: Payer: Self-pay | Admitting: Obstetrics and Gynecology

## 2021-01-02 ENCOUNTER — Ambulatory Visit (INDEPENDENT_AMBULATORY_CARE_PROVIDER_SITE_OTHER): Payer: BC Managed Care – PPO | Admitting: Obstetrics and Gynecology

## 2021-01-02 VITALS — BP 122/75 | HR 66 | Wt 203.0 lb

## 2021-01-02 DIAGNOSIS — M62838 Other muscle spasm: Secondary | ICD-10-CM | POA: Diagnosis not present

## 2021-01-02 DIAGNOSIS — N3281 Overactive bladder: Secondary | ICD-10-CM

## 2021-01-02 MED ORDER — MIRABEGRON ER 25 MG PO TB24: 25.0000 mg | ORAL_TABLET | Freq: Every day | ORAL | 5 refills | Status: DC

## 2021-01-05 ENCOUNTER — Encounter: Payer: Self-pay | Admitting: Obstetrics and Gynecology

## 2021-01-05 ENCOUNTER — Other Ambulatory Visit: Payer: Self-pay | Admitting: Adult Health

## 2021-01-05 MED ORDER — GABAPENTIN 300 MG PO CAPS: ORAL_CAPSULE | ORAL | 0 refills | Status: DC

## 2021-01-12 ENCOUNTER — Ambulatory Visit: Payer: BC Managed Care – PPO | Admitting: Physical Therapy

## 2021-01-12 MED ORDER — VIBEGRON 75 MG PO TABS: 1.0000 | ORAL_TABLET | Freq: Every day | ORAL | 5 refills | Status: DC

## 2021-01-17 ENCOUNTER — Other Ambulatory Visit: Payer: Self-pay | Admitting: Adult Health

## 2021-01-17 DIAGNOSIS — U071 COVID-19: Secondary | ICD-10-CM

## 2021-01-17 MED ORDER — PROMETHAZINE-DM 6.25-15 MG/5ML PO SYRP: 5.0000 mL | ORAL_SOLUTION | Freq: Four times a day (QID) | ORAL | 1 refills | Status: DC | PRN

## 2021-01-17 MED ORDER — DEXAMETHASONE 1 MG PO TABS: ORAL_TABLET | ORAL | 0 refills | Status: DC

## 2021-01-20 ENCOUNTER — Encounter: Payer: BC Managed Care – PPO | Admitting: Physical Therapy

## 2021-01-26 ENCOUNTER — Other Ambulatory Visit: Payer: Self-pay

## 2021-01-27 ENCOUNTER — Encounter: Payer: BC Managed Care – PPO | Admitting: Physical Therapy

## 2021-02-08 ENCOUNTER — Other Ambulatory Visit: Payer: Self-pay | Admitting: Adult Health

## 2021-02-10 ENCOUNTER — Other Ambulatory Visit: Payer: Self-pay

## 2021-02-11 ENCOUNTER — Other Ambulatory Visit: Payer: Self-pay

## 2021-02-11 MED ORDER — GABAPENTIN 300 MG PO CAPS: ORAL_CAPSULE | ORAL | 0 refills | Status: DC

## 2021-02-12 ENCOUNTER — Other Ambulatory Visit: Payer: Self-pay | Admitting: Adult Health

## 2021-02-12 MED ORDER — ALPRAZOLAM 0.5 MG PO TABS: ORAL_TABLET | ORAL | 0 refills | Status: DC

## 2021-02-17 ENCOUNTER — Encounter: Payer: BC Managed Care – PPO | Admitting: Adult Health Nurse Practitioner

## 2021-02-18 ENCOUNTER — Encounter: Payer: BC Managed Care – PPO | Admitting: Adult Health

## 2021-02-19 ENCOUNTER — Other Ambulatory Visit: Payer: Self-pay

## 2021-02-19 ENCOUNTER — Encounter: Payer: BC Managed Care – PPO | Attending: Obstetrics and Gynecology | Admitting: Physical Therapy

## 2021-02-19 ENCOUNTER — Encounter: Payer: Self-pay | Admitting: Physical Therapy

## 2021-02-19 DIAGNOSIS — M62838 Other muscle spasm: Secondary | ICD-10-CM | POA: Insufficient documentation

## 2021-02-19 DIAGNOSIS — R102 Pelvic and perineal pain: Secondary | ICD-10-CM | POA: Diagnosis not present

## 2021-02-19 DIAGNOSIS — M6281 Muscle weakness (generalized): Secondary | ICD-10-CM

## 2021-02-19 DIAGNOSIS — R32 Unspecified urinary incontinence: Secondary | ICD-10-CM

## 2021-02-19 DIAGNOSIS — K6289 Other specified diseases of anus and rectum: Secondary | ICD-10-CM

## 2021-02-19 DIAGNOSIS — R252 Cramp and spasm: Secondary | ICD-10-CM

## 2021-02-19 NOTE — Patient Instructions (Addendum)
Types of Fiber  There are two main types of fiber:  insoluble and soluble.  Both of these types can prevent and relieve constipation and diarrhea, although some people find one or the other to be more easily digested.  This handout details information about both types of fiber.  Insoluble Fiber  Soluble Fiber       Functions of Soluble Fiber  holds water in the colon to bulk and soften the stool prolongs stomach emptying time so that sugar is released and absorbed more slowly        Benefits of Soluble Fiber lowers total cholesterol and LDL cholesterol (the bad cholesterol) therefore reducing the risk of heart disease  regulates blood sugar for people with diabetes       Food Sources of Soluble Fiber oat/oat bran dried beans and peas  nuts  barley  flax seed or other seeds fruits such as oranges, pears, peaches, and apples  vegetables such as carrots  psyllium husk  prunes  Calm magnesium Citrate  About Abdominal Massage  Abdominal massage, also called external colon massage, is a self-treatment circular massage technique that can reduce and eliminate gas and ease constipation. The colon naturally contracts in waves in a clockwise direction starting from inside the right hip, moving up toward the ribs, across the belly, and down inside the left hip.  When you perform circular abdominal massage, you help stimulate your colon's normal wave pattern of movement called peristalsis.  It is most beneficial when done after eating.  Positioning You can practice abdominal massage with oil while lying down, or in the shower with soap.  Some people find that it is just as effective to do the massage through clothing while sitting or standing.  How to Massage Start by placing your finger tips or knuckles on your right side, just inside your hip bone.  Make small circular movements while you move upward toward your rib cage.   Once you reach the bottom right side of your rib cage, take your  circular movements across to the left side of the bottom of your rib cage.  Next, move downward until you reach the inside of your left hip bone.  This is the path your feces travel in your colon. Continue to perform your abdominal massage in this pattern for 10 minutes each day.     You can apply as much pressure as is comfortable in your massage.  Start gently and build pressure as you continue to practice.  Notice any areas of pain as you massage; areas of slight pain may be relieved as you massage, but if you have areas of significant or intense pain, consult with your healthcare provider.  Other Considerations General physical activity including bending and stretching can have a beneficial massage-like effect on the colon.  Deep breathing can also stimulate the colon because breathing deeply activates the same nervous system that supplies the colon.   Abdominal massage should always be used in combination with a bowel-conscious diet that is high in the proper type of fiber for you, fluids (primarily water), and a regular exercise program.  Access Code: [email or web address] URL: [email or web address] Date: 02/19/2021 Prepared by: Earlie Counts  Exercises Supine Hamstring Stretch - 1 x daily - 7 x weekly - 1 sets - 2 reps - 30 sec hold Supine Single Knee to Chest Stretch - 1 x daily - 7 x weekly - 1 sets - 2 reps - 30 sec hold Supine Piriformis Stretch with Leg Straight -  1 x daily - 7 x weekly - 1 sets - 2 reps - 30 sec hold Gastroc Stretch on Wall - 1 x daily - 7 x weekly - 1 sets - 2 reps - 30 sec hold Standing Thoracic Spine Stretch - 1 x daily - 7 x weekly - 1 sets - 2 reps - 30 sec hold   Earlie Counts, PT [facility] [address], [place], [place] [address] W: [phone] [email or web address]

## 2021-02-19 NOTE — Therapy (Signed)
[place] at [facility] [address] [place], [place], [address] Phone: [phone]   Fax:  [phone]  Physical Therapy Treatment  Patient Details  Name: Beth [name] MRN: [id number] Date of Birth: Nov 08, 1955 Referring Provider (PT): Dr. [name]   Encounter Date: 02/19/2021   PT End of Session - 02/19/21 1106     Visit Number 2    Date for PT Re-Evaluation 04/17/21    Authorization Type BCBS    PT Start Time 1030    PT Stop Time 1115    PT Time Calculation (min) 45 min    Activity Tolerance Patient tolerated treatment well;No increased pain    Behavior During Therapy WFL for tasks assessed/performed             Past Medical History:  Diagnosis Date   Anxiety    Chronic constipation    followed by dr Carlean Purl   CKD (chronic kidney disease), stage III ([place])    Enterocele    History of cervical cancer    hx squamous cell cervical carcinoma,   s/p  radical hysterectomy 11-24-1999   History of MRSA infection    2005--- buttock abscess   History of pulmonary embolus (PE)    post op 10-09-2017 (surgery on 09-19-2017)  lower range pulmonary artery,  resolved and completed 6 month Eliquis   History of renal cell carcinoma    s/p  left nephrectomy 09-19-2017  (oncologist -- dr Alen Blew / urologsit-- dr bell)   History of seizure    10/ 2001  one episode per discharge note in epic, 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 (pt had just been dx with lupus);  (05-21-2019 per pt none since)   Incisional hernia    MDD (major depressive disorder)    OA (osteoarthritis)    OSA on CPAP    followed by dr dohmeier   Pre-diabetes    Rectocele    RLS (restless legs syndrome) 01/24/2015   Per sleep study 05/2014    Solitary right kidney    acquired 09-19-2017 s/p left nephrectomy   SUI (stress urinary incontinence, female)    05-21-2019  improved with therapy   Systemic lupus erythematosus  ([place])    rheumotologist--- dr Amil Amen (lov 03-19-2019 in epic)   Vitamin D deficiency    Wears glasses     Past Surgical History:  Procedure Laterality Date   CESAREAN SECTION  x3   last one 1980   COLD KNIFE CERVICAL CONIZATION   10/07/1999  $RemoveBef'@[email or web address]'[email or web address]$    COLONOSCOPY     negative per pt, Dr. Alice Reichert - [facility]   EXPLORATORY LAPAROTOMY MODIFIED RADICAL HYSTERECTOMY WITH PELVIC LYMPHADENECTOMY AND RESECTION MECKEL'S DIVERTICULUM  11-24-1999 dr Aldean Ast  $Re'@[facility]'TNm$    INCISIONAL HERNIA REPAIR N/A 05/23/2019   Procedure: LAPAROSCOPIC INCISIONAL HERNIA REPAIR WITH MESH;  Surgeon: Kinsinger, Arta Bruce, MD;  Location: [place];  Service: General;  Laterality: N/A;   LAPAROSCOPIC NEPHRECTOMY, HAND ASSISTED Left 09/19/2017   Procedure: LEFT HAND ASSISTED LAPAROSCOPIC NEPHRECTOMY;  Surgeon: Lucas Mallow, MD;  Location: [facility];  Service: Urology;  Laterality: Left;    There were no vitals filed for this visit.   Subjective Assessment - 02/19/21 1023     Subjective Patient was not here for 2 months due to [place]. I am not doing the valium in the vagina and instead doing the injections. when stool at the entrance stinging and tingling in the legs. After the stool is out  the left knee feels locked and tingling is reduced.  Feels off balance when the stool in the rectum. Her stools are hard.    Patient Stated Goals reduce pain, improve balance, return to exercise    Currently in Pain? Yes    Pain Score 7     Pain Location Rectum    Pain Orientation Mid    Pain Descriptors / Indicators Pressure    Pain Type Chronic pain    Pain Onset More than a month ago    Pain Frequency Constant    Aggravating Factors  having a bowel movement, walking, sitting    Pain Relieving Factors lay down to rest    Multiple Pain Sites Yes    Pain Score 7    Pain Location Vagina    Pain Orientation Mid    Pain Descriptors / Indicators Heaviness;Constant   left is worse than right   Pain Type Chronic pain    Pain  Onset More than a month ago    Pain Frequency Constant    Aggravating Factors  walking, exercise    Pain Relieving Factors sit and rest                               [facility] Adult PT Treatment/Exercise - 02/19/21 0001       Self-Care   Self-Care Other Self-Care Comments    Other Self-Care Comments  discussed with drinking water,eating more soluble fiber to soften her stools since they are type 1      Neuro Re-ed    Neuro Re-ed Details  diaphragmatic breathing with trying to have the air go to the lower abdomen to relax the pelvic floor. Had difficutly with the tightness in the lower abdomen where her scars are      Lumbar Exercises: Stretches   Active Hamstring Stretch Right;Left;1 rep;30 seconds    Active Hamstring Stretch Limitations supine    Single Knee to Chest Stretch Right;Left;1 rep;30 seconds    Single Knee to Chest Stretch Limitations supine    Lower Trunk Rotation 2 reps;30 seconds   right left   Lower Trunk Rotation Limitations supine pull leg across    Hip Flexor Stretch Right;Left;1 rep;30 seconds    Hip Flexor Stretch Limitations standing pushing on wall, gastroc stretch    Other Lumbar Stretch Exercise standing back stretch with leaning forward on counter      Manual Therapy   Manual Therapy Soft tissue mobilization    Manual therapy comments educated patient on abdominal massage and scar massage    Soft tissue mobilization manual work to the scars on the lower abdomen to imporve mobility, work on the tissue suprpubically to elongate the tissue; cirecular massage to stimulat ethe peristalic motion of the intestincs, colonic massage to stimulate tihe intestines and lymph drainage for the lower abdomen to reduc swelling                     PT Education - 02/19/21 1118     Education Details Access Code: [email or web address]  ; education on sluble fiber to help with bowel movements; education on scar massage    Person(s) Educated Patient    Methods  Explanation;Demonstration;Verbal cues;Handout    Comprehension Returned demonstration;Verbalized understanding              PT Short Term Goals - 12/16/20 2025       PT SHORT TERM GOAL #1  Title indpendent with hip stretches; diaphragmatic breathing; and scar massage    Time 4    Period Weeks    Status New    Target Date 01/13/21      PT SHORT TERM GOAL #2   Title understand correct toileting techniques to reduce straining with bowel movements and empty the bowel completely    Time 4    Period Weeks    Status New    Target Date 01/13/21      PT SHORT TERM GOAL #3   Title able to fully empty her bladder due to the ability to relax her pelvic floor muscles and understands techniques to assist in emptying    Time 4    Period Weeks    Status New    Target Date 01/13/21               PT Long Term Goals - 12/16/20 2028       PT LONG TERM GOAL #1   Title independent with advanced HEP for improve strength with a gentle exercise routine    Time 12    Period Weeks    Status New    Target Date 03/10/21      PT LONG TERM GOAL #2   Title understand ways to manage her pain with breathing, body scanning, and medtiation    Time 12    Period Weeks    Status New    Target Date 03/10/21      PT LONG TERM GOAL #3   Title able to have a complete bowel movement every 2-3 days with pain decreased </= 1-2/10    Time 12    Period Weeks    Status New    Target Date 03/10/21      PT LONG TERM GOAL #4   Title urinary leakage with activity decreased >/= 50% due to the ability to fully relax the pelvic floor and contract >/= 3/5 strength    Time 12    Period Weeks    Status New    Target Date 03/10/21                   Plan - 02/19/21 1120     Clinical Impression Statement Patent had less intense pain after the manaual work. She had less abdominal swelling with the manual work. She has learned stretches to elongate the leg and pelvic floor muscles. Patient was  educated on soluble fiber and abdominal massage to soften the type 1 stool. She was not able to come into therapy for 2 months due to her having COVID. She has trouble with diaphgramatic braething due ot the tightness in the lower abdomen where her scars are. Patient just started therapy so she has not met goals. Patient will benefit from skilled therapy to reduce her pelvic pain so she will be able to exercise to improve her balance and core strength.    Personal Factors and Comorbidities Age;Comorbidity 3+;Fitness;Past/Current Experience;Time since onset of injury/illness/exacerbation    Comorbidities c-section x3; Hx of cervical cancer 11/24/1999; Hx of pulmonary emolus 10/09/2017; Hx o f renal cell cancer 09/19/2017; Lupus; insicional hernia repair 05/23/2019; radiacl lymphodenectomy 11/24/1999; cold knife cervical conizaaltion 10/07/1999    Examination-Activity Limitations Bed Mobility;Sit;Bend;Squat;Carry;Continence;Toileting;Lift;Locomotion Level;Transfers    Examination-Participation Restrictions Cleaning;Community Activity    Stability/Clinical Decision Making Evolving/Moderate complexity    Rehab Potential Good    PT Frequency 2x / week    PT Duration 12 weeks    PT Treatment/Interventions ADLs/Self  Care Home Management;Aquatic Therapy;Electrical Stimulation;Therapeutic activities;Therapeutic exercise;Balance training;Neuromuscular re-education;Patient/family education;Manual techniques;Manual lymph drainage;Scar mobilization;Passive range of motion;Dry needling;Spinal Manipulations;Joint Manipulations    PT Next Visit Plan manual work to the abdomen; diaphragmatic breathing; pelvic floor meditation; lymph drainage to the legs; balance exercises; in aquatic therapy working on leg strength and balance; pelvic floor manual work; see if the cocnut oil is helping with the dryness    PT Home Exercise Plan Access Code: [email or web address]    Recommended Other Services MD signd initial note    Consulted and Agree with  Plan of Care Patient             Patient will benefit from skilled therapeutic intervention in order to improve the following deficits and impairments:  Decreased endurance, Decreased scar mobility, Decreased activity tolerance, Decreased strength, Increased fascial restricitons, Pain, Decreased balance, Decreased mobility, Increased muscle spasms, Difficulty walking, Decreased coordination, Decreased range of motion  Visit Diagnosis: Muscle weakness (generalized)  Cramp and spasm  Urinary incontinence, unspecified type  Rectal pain     Problem List Patient Active Problem List   Diagnosis Date Noted   Insulin resistance 12/09/2020   Stage 3a chronic kidney disease ([place]) 10/29/2020   OSA on CPAP 01/16/2019   Rectocele 12/04/2017   History of pulmonary embolus (PE) 10/09/2017   History of renal cell carcinoma 10/09/2017   H/O left nephrectomy 10/09/2017   Depression 10/09/2017   Anxiety 10/09/2017   RLS (restless legs syndrome) 01/24/2015   Morbid obesity ([place])- BMI 30+ with OSA 05/28/2014   SUI (stress urinary incontinence, female) 05/28/2014   Medication management 05/28/2014   Hypertension    Abnormal glucose (prediabetes)    Hyperlipidemia    Vitamin D deficiency    Environmental allergies 07/14/2006   Chronic constipation 07/14/2006   Lupus (systemic lupus erythematosus) ([place]) 07/14/2006    Earlie Counts, PT 02/19/21 11:27 AM   [facility] at [facility] [address], [place], [place], [address] Phone: [phone]   Fax:  [phone]  Name: Beth [name] MRN: [id number] Date of Birth: [birth year]

## 2021-02-24 IMAGING — MG DIGITAL SCREENING BILAT W/ TOMO W/ CAD
6 of 12 series · 6 of 36 positions shown · non-contrast
Comparison: Previous exam(s).

CLINICAL DATA: Screening.

EXAM:
DIGITAL SCREENING BILATERAL MAMMOGRAM WITH TOMO AND CAD

[L MLO synth-2D]
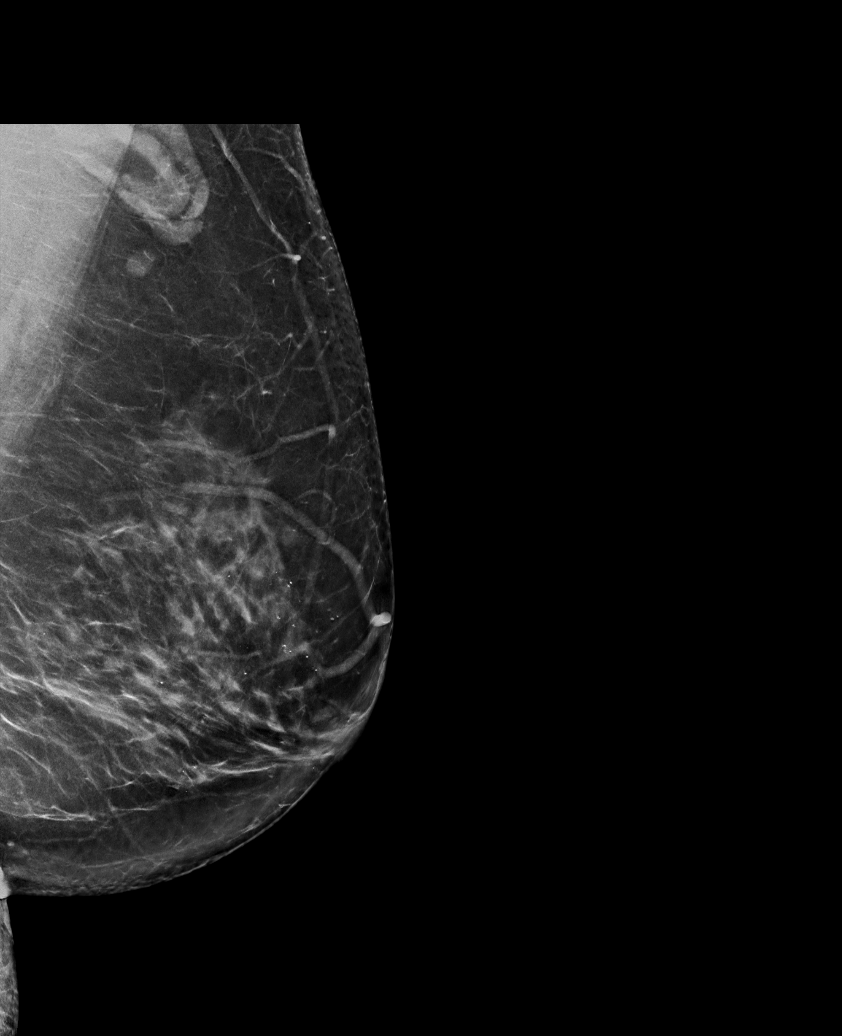

[R XCCL synth-2D]
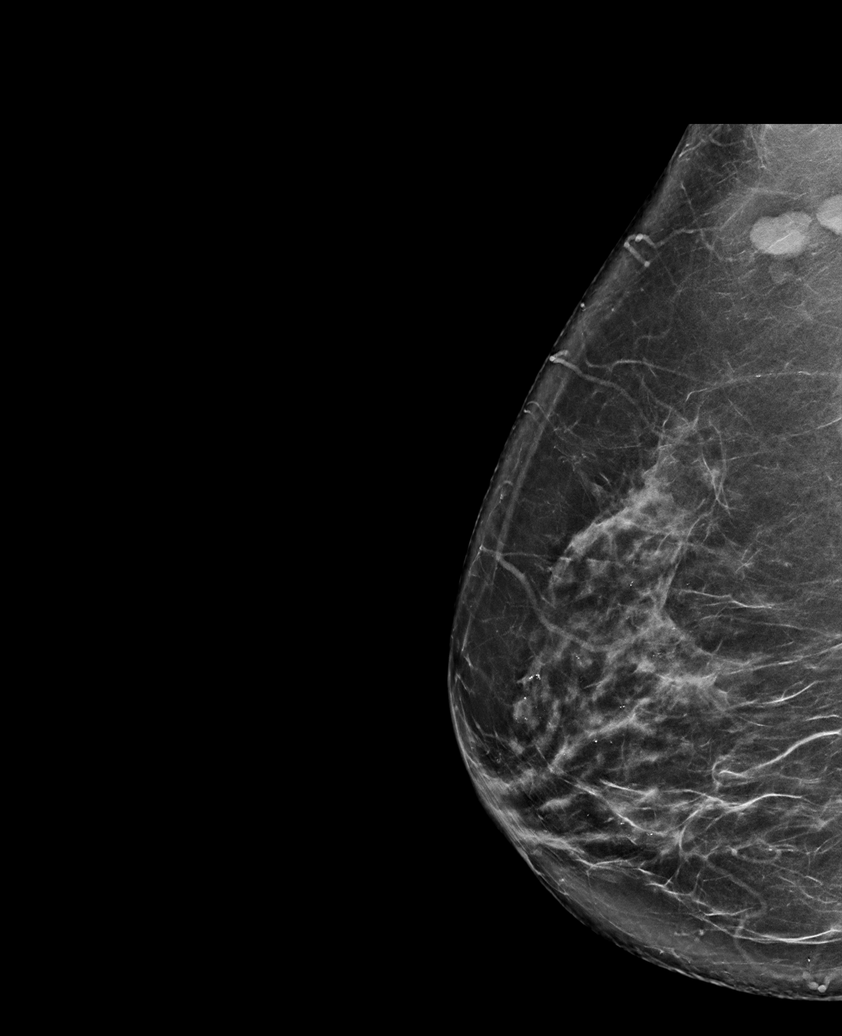

[R CC synth-2D]
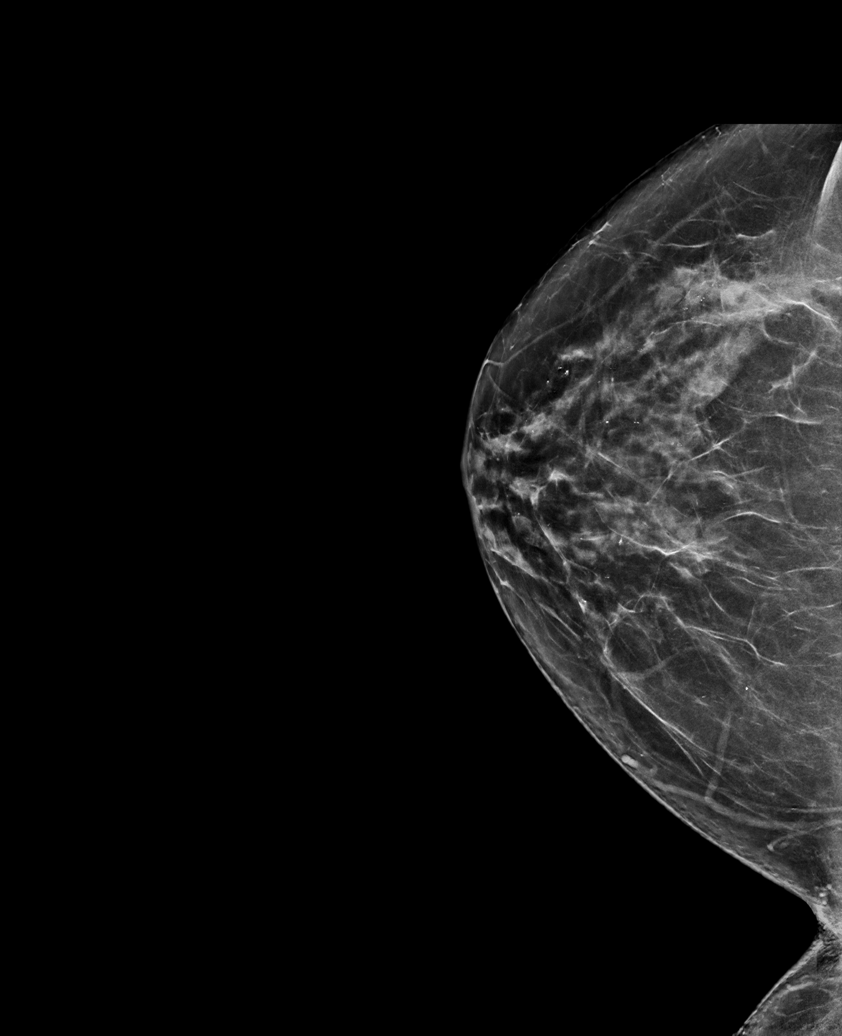

[R MLO synth-2D]
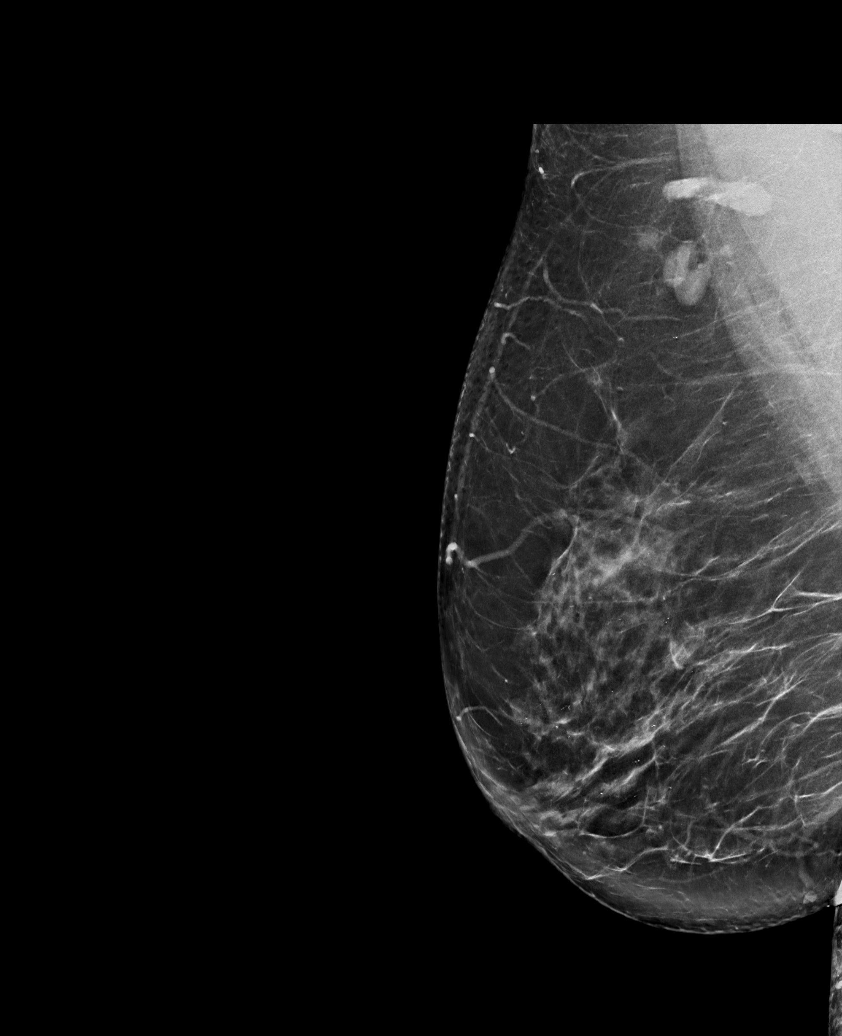

[L XCCL synth-2D]
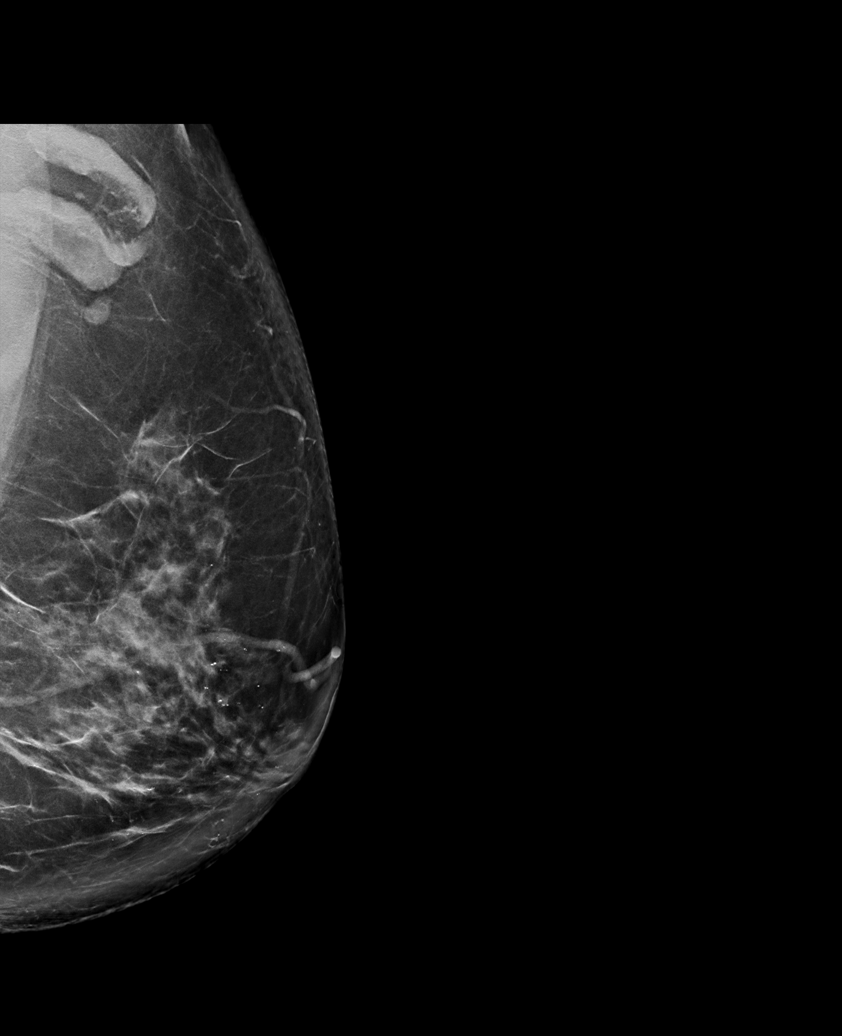

[L CC synth-2D]
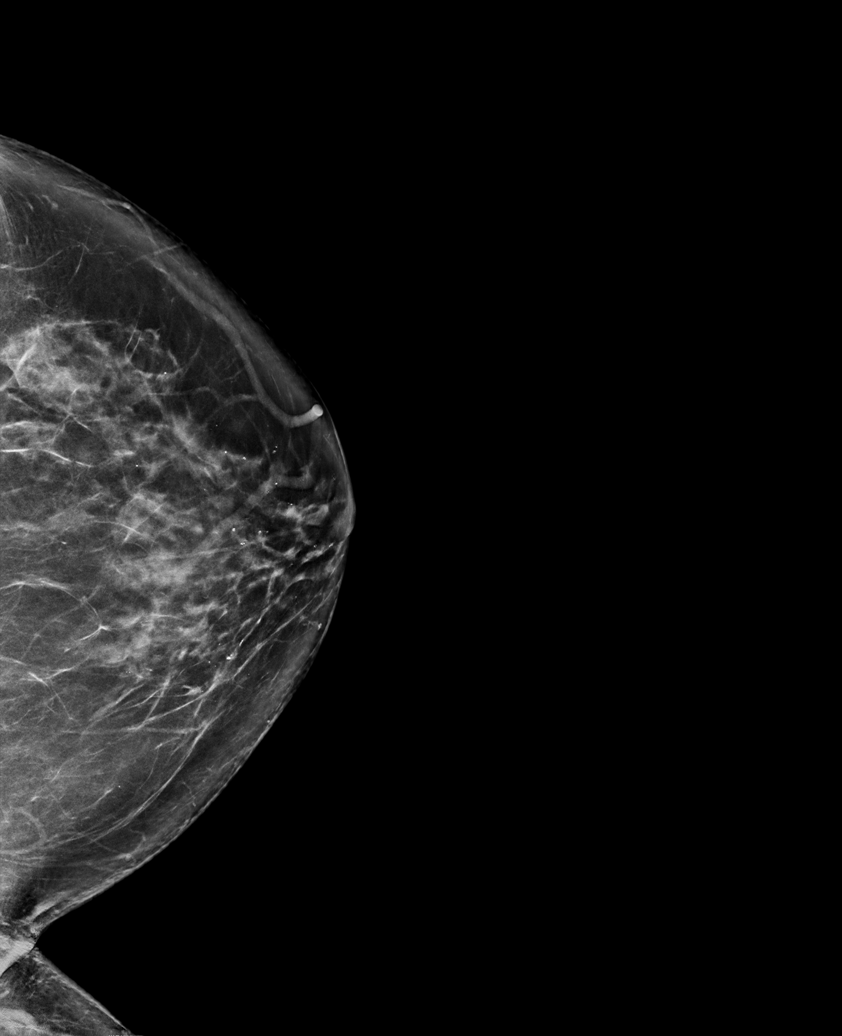

[6 of 36 positions shown; findings below may reference images not displayed]

ACR Breast Density Category c: The breast tissue is heterogeneously
dense, which may obscure small masses.
FINDINGS: There are no findings suspicious for malignancy. Images were
processed with CAD.
IMPRESSION: No mammographic evidence of malignancy. A result letter of this
screening mammogram will be mailed directly to the patient.

RECOMMENDATION:
Screening mammogram in one year. (Code:FT-U-LHB)

BI-RADS CATEGORY  1: Negative.

## 2021-02-24 IMAGING — US US SOFT TISSUE HEAD/NECK
1 series · 12 of 12 positions shown · non-contrast
Comparison: Chest CTA 10/09/2017

CLINICAL DATA: Left supraclavicular fullness with mobile nodule
detected

EXAM:
ULTRASOUND OF HEAD/NECK SOFT TISSUES
TECHNIQUE: Ultrasound examination of the head and neck soft tissues was
performed in the area of clinical concern.

[Series 1: us soft tissue head/neck · 0.04mm/px · 12 of 12 slices shown]
[im 1/12]
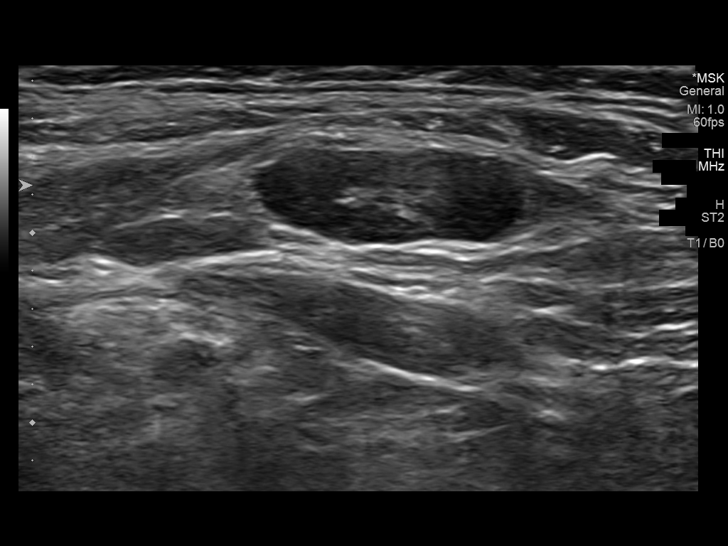
[im 2/12]
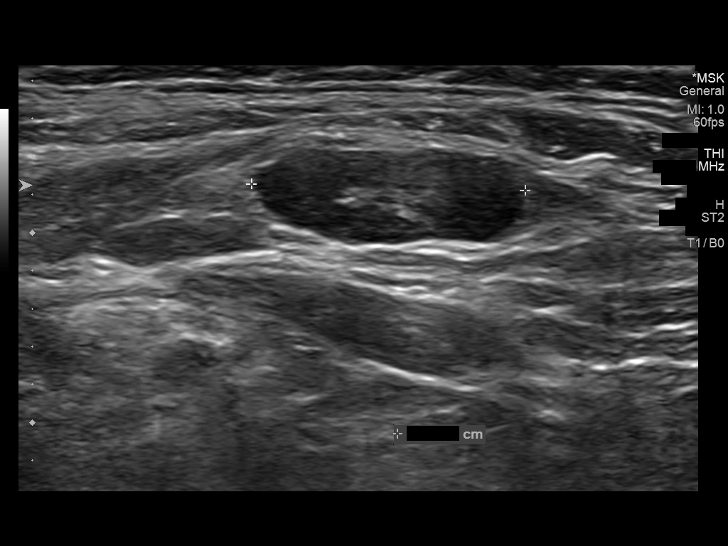
[im 3/12]
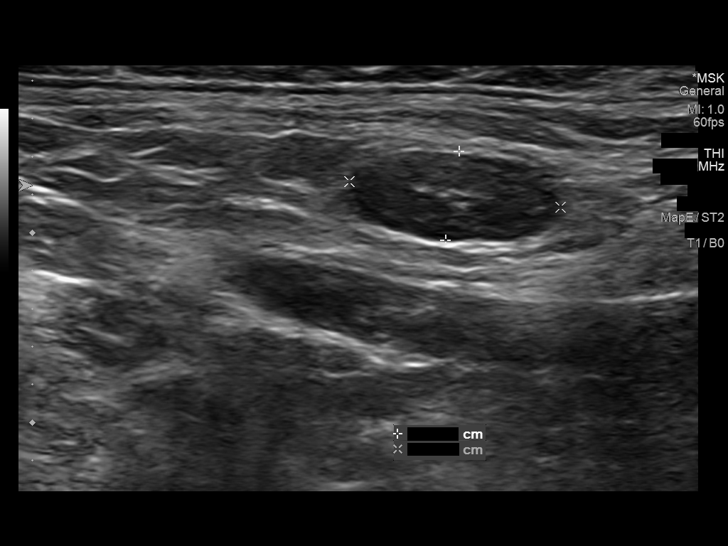
[im 4/12]
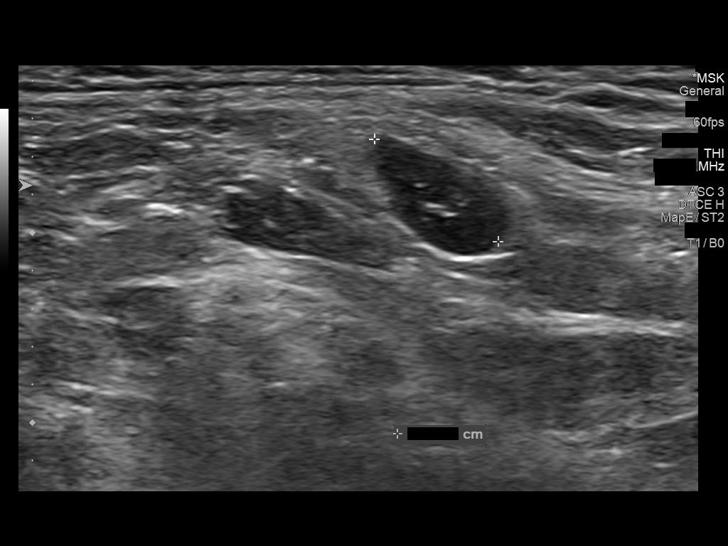
[im 5/12]
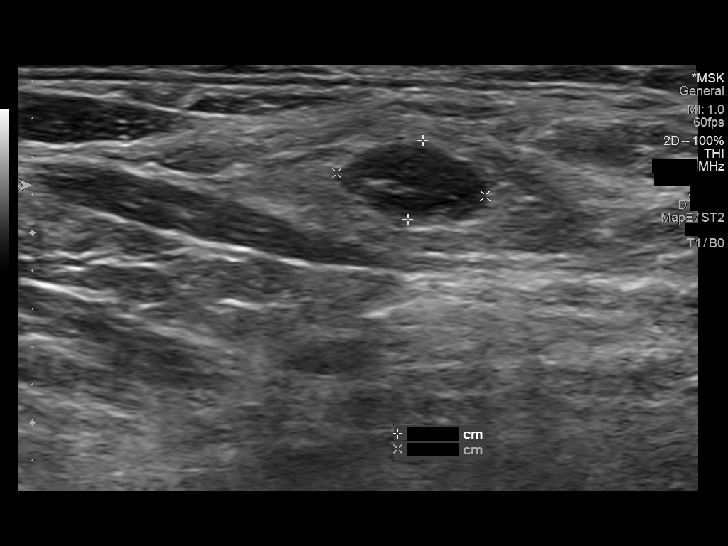
[im 6/12]
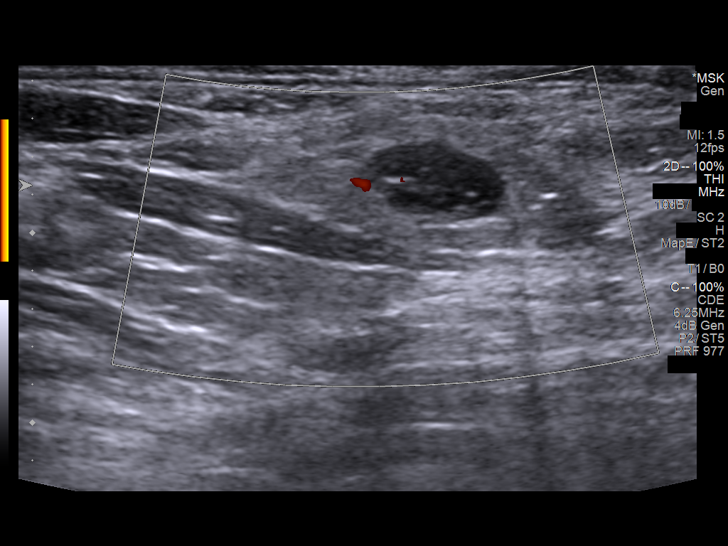
[im 7/12]
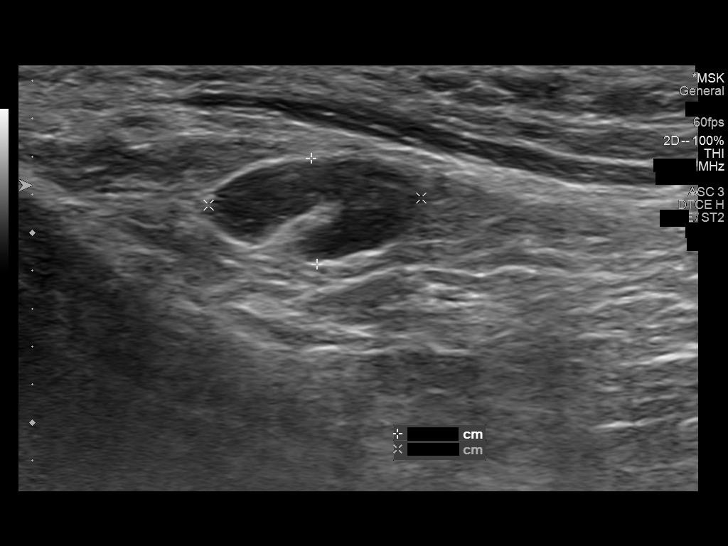
[im 8/12]
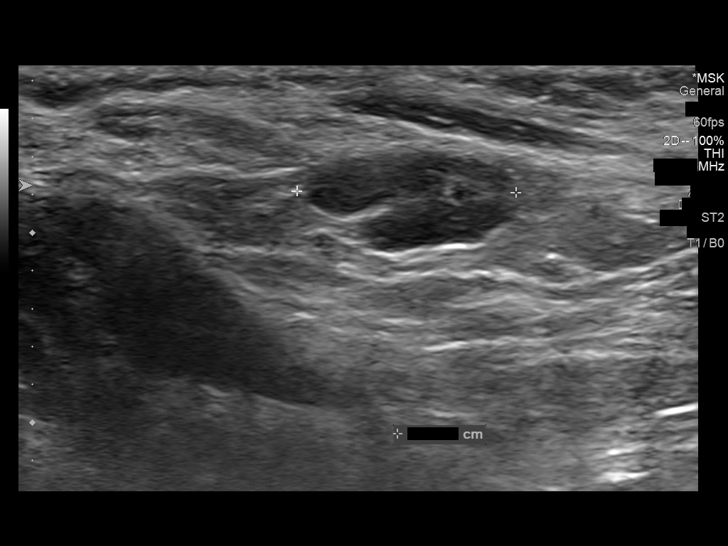
[im 9/12]
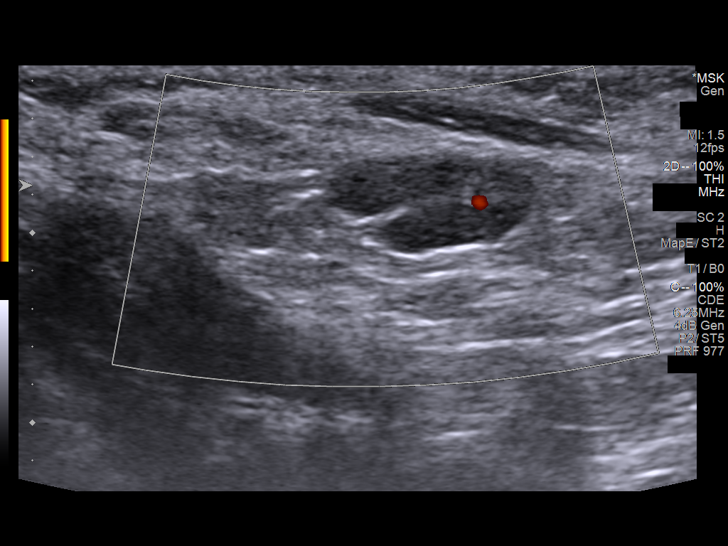
[im 10/12]
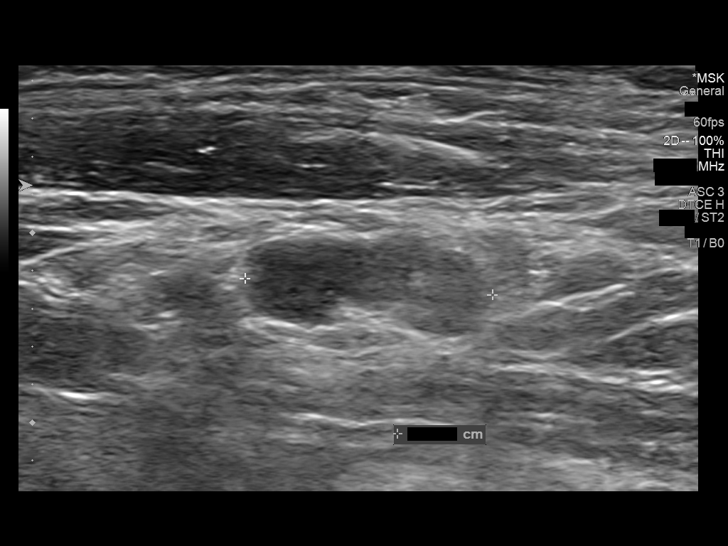
[im 11/12]
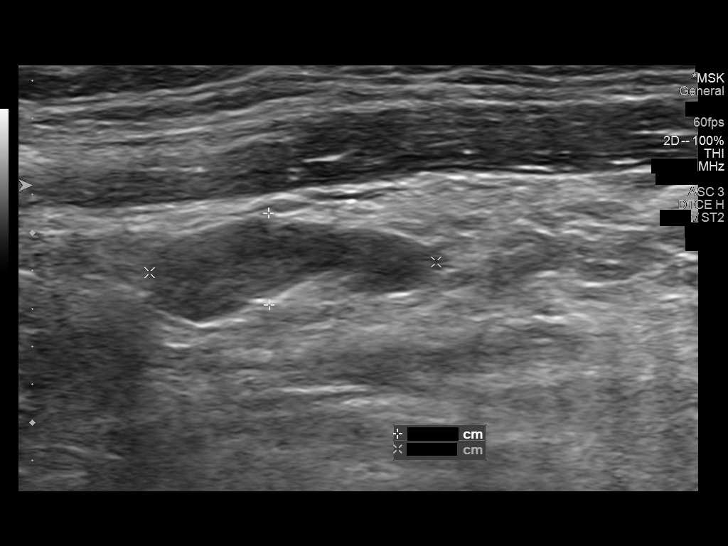
[im 12/12]
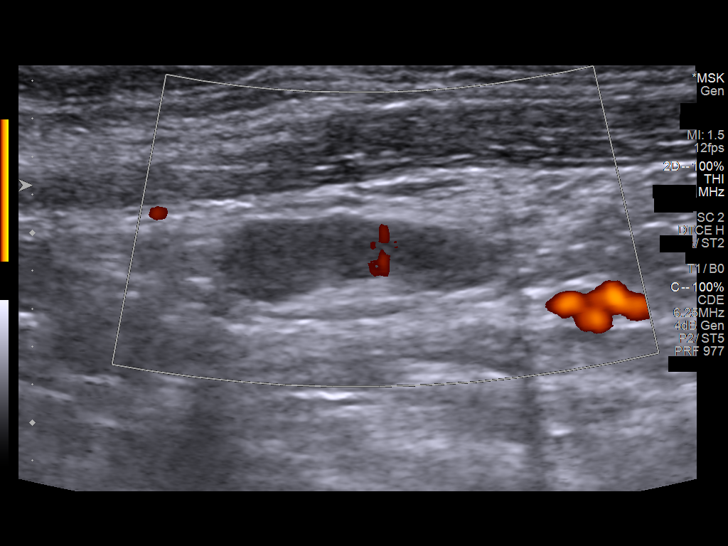

[12 of 12 positions shown; findings below may reference images not displayed]

FINDINGS: The nodule has a lymph node architecture and measures 14 x 5 mm,
mildly enlarged. Cortex has a normal homogeneous appearance no mass
or fluid collection. Lymph nodes of similar size were seen in the
left supraclavicular fossa, lower left neck, and left axilla on
comparison chest CT.
IMPRESSION: Palpable complaint correlates with prominent lymph nodes which
measure similar to left supraclavicular and axillary nodes on chest
CT 10/09/2017, suggesting benign process. Recommend clinical
surveillance with follow-up imaging if there is growth.

## 2021-02-27 ENCOUNTER — Ambulatory Visit (INDEPENDENT_AMBULATORY_CARE_PROVIDER_SITE_OTHER)
Admission: RE | Admit: 2021-02-27 | Discharge: 2021-02-27 | Disposition: A | Discharge status: Home / Self Care | Payer: BC Managed Care – PPO | Source: Ambulatory Visit | Attending: Internal Medicine | Admitting: Internal Medicine

## 2021-02-27 ENCOUNTER — Ambulatory Visit (INDEPENDENT_AMBULATORY_CARE_PROVIDER_SITE_OTHER): Payer: BC Managed Care – PPO | Admitting: Internal Medicine

## 2021-02-27 ENCOUNTER — Encounter: Payer: Self-pay | Admitting: Internal Medicine

## 2021-02-27 ENCOUNTER — Other Ambulatory Visit: Payer: Self-pay

## 2021-02-27 VITALS — BP 144/74 | HR 75 | Ht 61.5 in | Wt 201.0 lb

## 2021-02-27 DIAGNOSIS — M533 Sacrococcygeal disorders, not elsewhere classified: Secondary | ICD-10-CM | POA: Diagnosis not present

## 2021-02-27 DIAGNOSIS — N9489 Other specified conditions associated with female genital organs and menstrual cycle: Secondary | ICD-10-CM

## 2021-02-27 DIAGNOSIS — M62838 Other muscle spasm: Secondary | ICD-10-CM

## 2021-02-27 NOTE — Progress Notes (Signed)
Britanie Harshman 65 y.o. [birth year] [id number]  Assessment & Plan:   Encounter Diagnoses  Name Primary?   Coccydynia Yes   High-tone pelvic floor dysfunction    Levator spasm     I think she needs to continue with her therapy, there has been some discussion about the possibility of using injection therapy to relieve her spasms.  I think her pelvic floor dysfunction and levator spasm may be related to the symptoms.  I tried to reassure her the best I could but I do not think I have much else to offer her.  I did decide to do x-rays of the coccyx. Orders Placed This Encounter  Procedures   DG Sacrum/Coccyx     Subjective:   Chief Complaint: Rectal pain  HPI The patient is here for follow-up, she has a history of chronic constipation and rectocele and has been referred to Dr. Wannetta Sender and Earlie Counts, PT.  She has had a couple of PT appointments.  She has been diagnosed with significant high tone pelvic floor dysfunction and levator spasm.  She tried Valium suppositories and thinks that she started to have a flare of chronic coccygeal pain after that.  She is asking many questions about the relationship of her pain in her coccyx area and could be related to a 2004 disimpaction event when she was hospitalized at some point.  She said that the nurse pressed on that area trying to help her defecate and ever since then she has had pain.  She also reports that sometimes when she moves from side to side she can feel "things shifting in there".   Allergies  Allergen Reactions   Bactrim [Sulfamethoxazole-Trimethoprim]     GI upset   Cymbalta [Duloxetine Hcl]     Sweating   Acyclovir And Related Itching   Current Meds  Medication Sig   acetaminophen (TYLENOL) 500 MG tablet Take 1,000 mg by mouth every 6 (six) hours as needed for mild pain.   ALPRAZolam (XANAX) 0.5 MG tablet TAKE 1/2-1 TABLET 1-2 TIMES A DAY ONLY IF NEEDED FOR PANIC/ANXIETY ATTACK. LIMIT TO 5 DAYS/WEEK TO AVOID  ADDICTION & DEMENTIA   azaTHIOprine (IMURAN) 50 MG tablet Take 50 mg by mouth 2 (two) times daily.   buPROPion (WELLBUTRIN XL) 150 MG 24 hr tablet TAKE 1 TABLET EVERY MORNING FOR MOOD, FOCUS & CONCENTRATION   Cholecalciferol (VITAMIN D) 2000 UNITS CAPS Take 1 capsule by mouth daily.   escitalopram (LEXAPRO) 20 MG tablet TAKE 1.5 TABLETS (30 MG) DAILY FOR MOOD & CHRONIC ANXIETY   gabapentin (NEURONTIN) 300 MG capsule Take 1-2 caps at night as needed for anxiety, pain and sleep.   Magnesium 500 MG TABS Take 500 mg by mouth daily.    Semaglutide,0.25 or 0.5MG /DOS, (OZEMPIC, 0.25 OR 0.5 MG/DOSE,) 2 MG/1.5ML SOPN Inject 0.5 mg into skin of stomach for sugar and insulin resistance.   Past Medical History:  Diagnosis Date   Anxiety    Chronic constipation    followed by dr Carlean Purl   CKD (chronic kidney disease), stage III ([place])    Enterocele    History of cervical cancer    hx squamous cell cervical carcinoma,   s/p  radical hysterectomy 11-24-1999   History of MRSA infection    2005--- buttock abscess   History of pulmonary embolus (PE)    post op 10-09-2017 (surgery on 09-19-2017)  lower range pulmonary artery,  resolved and completed 6 month Eliquis   History of renal cell carcinoma  s/p  left nephrectomy 09-19-2017  (oncologist -- dr Alen Blew / urologsit-- dr bell)   History of seizure    10/ 2001  one episode per discharge note in epic, 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 (pt had just been dx with lupus);  (05-21-2019 per pt none since)   Incisional hernia    MDD (major depressive disorder)    OA (osteoarthritis)    OSA on CPAP    followed by dr dohmeier   Pre-diabetes    Rectocele    RLS (restless legs syndrome) 01/24/2015   Per sleep study 05/2014    Solitary right kidney    acquired 09-19-2017 s/p left nephrectomy   SUI (stress urinary incontinence, female)    05-21-2019  improved with therapy   Systemic lupus erythematosus ([place])     rheumotologist--- dr Amil Amen (lov 03-19-2019 in epic)   Vitamin D deficiency    Wears glasses    Past Surgical History:  Procedure Laterality Date   CESAREAN SECTION  x3   last one [place]   10/07/1999  @[facility]    COLONOSCOPY     negative per pt, Dr. Alice Reichert - [facility]   EXPLORATORY LAPAROTOMY MODIFIED RADICAL HYSTERECTOMY WITH PELVIC LYMPHADENECTOMY AND RESECTION MECKEL'S DIVERTICULUM  11-24-1999 dr Aldean Ast  @[facility]    [place] N/A 05/23/2019   Procedure: LAPAROSCOPIC INCISIONAL HERNIA REPAIR WITH MESH;  Surgeon: Kinsinger, Arta Bruce, MD;  Location: [place];  Service: General;  Laterality: N/A;   LAPAROSCOPIC NEPHRECTOMY, HAND ASSISTED Left 09/19/2017   Procedure: LEFT HAND ASSISTED LAPAROSCOPIC NEPHRECTOMY;  Surgeon: Lucas Mallow, MD;  Location: [facility];  Service: Urology;  Laterality: Left;   Social History   Social History Narrative   Single - 3 children   [employer]ary [employer]   Never smoker, No ETOH/drugs   family history includes Heart disease in her mother; Hyperlipidemia in her mother; Hypertension in her mother.   Review of Systems As above  Objective:   Physical Exam BP (!) 144/74   Pulse 75   Ht 5' 1.5" (1.562 m)   Wt 201 lb (91.2 kg)   BMI 37.36 kg/m  Obese NAD Dorisann Frames CMA present  Rectal - tender coccyx, sl mobile seems NL overall to me.  No mass.  Anoscopy grade 1 internal hemorrhoids no inflammatory change or other abnormalities

## 2021-02-27 NOTE — Patient Instructions (Signed)
If you are age 65 or older, your body mass index should be between 23-30. Your Body mass index is 37.36 kg/m. If this is out of the aforementioned range listed, please consider follow up with your Primary Care Provider. __________________________________________________________  The [facility] providers would like to encourage you to use [facility] to communicate with providers for non-urgent requests or questions.  Due to long hold times on the telephone, sending your provider a message by [facility] may be a faster and more efficient way to get a response.  Please allow 48 business hours for a response.  Please remember that this is for non-urgent requests.   Please continue your pelvic physical therapy and continue to see Dr Wannetta Sender.  Go to x-ray before leaving today to get a tailbone firm done.   I appreciate the opportunity to care for you. Silvano Rusk, MD, [facility]

## 2021-03-02 ENCOUNTER — Encounter: Payer: Self-pay | Admitting: Obstetrics and Gynecology

## 2021-03-03 ENCOUNTER — Encounter: Payer: Self-pay | Admitting: Physical Therapy

## 2021-03-03 ENCOUNTER — Other Ambulatory Visit: Payer: Self-pay

## 2021-03-03 ENCOUNTER — Encounter: Payer: BC Managed Care – PPO | Admitting: Physical Therapy

## 2021-03-03 DIAGNOSIS — K6289 Other specified diseases of anus and rectum: Secondary | ICD-10-CM

## 2021-03-03 DIAGNOSIS — M6281 Muscle weakness (generalized): Secondary | ICD-10-CM

## 2021-03-03 DIAGNOSIS — R252 Cramp and spasm: Secondary | ICD-10-CM

## 2021-03-03 DIAGNOSIS — M62838 Other muscle spasm: Secondary | ICD-10-CM | POA: Diagnosis not present

## 2021-03-03 DIAGNOSIS — R32 Unspecified urinary incontinence: Secondary | ICD-10-CM

## 2021-03-03 NOTE — Patient Instructions (Addendum)
     [facility] Welcome to [place]! Here you will find all the information you will need regarding your pool therapy. If you have further questions at any time, please call our office at [phone]. After completing your initial evaluation in the [place] clinic, you may be eligible to complete a portion of your therapy in the pool. A typical week of therapy will consist of 1-2 typical physical therapy visits at our [place] location and an additional session of therapy in the pool located at the [facility]. [address] [place]. The phone number at the pool site is [phone]. Please call this number if you are running late or need to cancel your appointment.  Aquatic therapy will be offered on Friday afternoons. Each session will last approximately 45 minutes. All scheduling and payments for aquatic therapy sessions, including cancelations, will be done through our [place] location.  To be eligible for aquatic therapy, these criteria must be met: You must be able to independently change in the locker room and get to the pool deck. A caregiver can come with you to help if needed. There are bleachers for a caregiver to sit on next to the pool. No one with an open wound is permitted in the pool.  Handicap parking is available in the front and there is a drop off option for even closer accessibility. Please arrive 15 minutes prior to your appointment to prepare for your pool session. You must sign in at the front desk upon your arrival. Please be sure to attend to any toileting needs prior to entering the pool. [place] rooms for changing are located to the right of the check-in desk. There is direct access to the pool deck from the locker room. You can lock your belongings in a locker but must bring a lock. Your therapist will greet you on the pool deck. There may be other swimmers in the pool at the  same time but your session is one-on-one with the therapist.  Earlie Counts, PT [facility] [address], [place], [place] [address] W: [phone] [email or web address]

## 2021-03-03 NOTE — Therapy (Signed)
[place] at [facility] [address] [place], [place], [address] Phone: [phone]   Fax:  [phone]  Physical Therapy Treatment  Patient Details  Name: Trysta Showman MRN: [id number] Date of Birth: 21-Oct-1955 Referring Provider (PT): Dr. [name]   Encounter Date: 03/03/2021   PT End of Session - 03/03/21 1035     Visit Number 3    Date for PT Re-Evaluation 04/17/21    Authorization Type BCBS    PT Start Time 0935    PT Stop Time 1023    PT Time Calculation (min) 48 min    Activity Tolerance Patient tolerated treatment well;No increased pain    Behavior During Therapy WFL for tasks assessed/performed             Past Medical History:  Diagnosis Date   Anxiety    Chronic constipation    followed by dr Carlean Purl   CKD (chronic kidney disease), stage III ([place])    Enterocele    History of cervical cancer    hx squamous cell cervical carcinoma,   s/p  radical hysterectomy 11-24-1999   History of MRSA infection    2005--- buttock abscess   History of pulmonary embolus (PE)    post op 10-09-2017 (surgery on 09-19-2017)  lower range pulmonary artery,  resolved and completed 6 month Eliquis   History of renal cell carcinoma    s/p  left nephrectomy 09-19-2017  (oncologist -- dr Alen Blew / urologsit-- dr bell)   History of seizure    10/ 2001  one episode per discharge note in epic, 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 (pt had just been dx with lupus);  (05-21-2019 per pt none since)   Incisional hernia    MDD (major depressive disorder)    OA (osteoarthritis)    OSA on CPAP    followed by dr dohmeier   Pre-diabetes    Rectocele    RLS (restless legs syndrome) 01/24/2015   Per sleep study 05/2014    Solitary right kidney    acquired 09-19-2017 s/p left nephrectomy   SUI (stress urinary incontinence, female)    05-21-2019  improved with therapy   Systemic lupus erythematosus  ([place])    rheumotologist--- dr Amil Amen (lov 03-19-2019 in epic)   Vitamin D deficiency    Wears glasses     Past Surgical History:  Procedure Laterality Date   CESAREAN SECTION  x3   last one 1980   COLD KNIFE CERVICAL CONIZATION   10/07/1999  @[facility]    COLONOSCOPY     negative per pt, Dr. Alice Reichert - [facility]   EXPLORATORY LAPAROTOMY MODIFIED RADICAL HYSTERECTOMY WITH PELVIC LYMPHADENECTOMY AND RESECTION MECKEL'S DIVERTICULUM  11-24-1999 dr Aldean Ast  @[facility]    INCISIONAL HERNIA REPAIR N/A 05/23/2019   Procedure: LAPAROSCOPIC [place];  Surgeon: Kinsinger, Arta Bruce, MD;  Location: [place];  Service: General;  Laterality: N/A;   LAPAROSCOPIC NEPHRECTOMY, HAND ASSISTED Left 09/19/2017   Procedure: LEFT HAND ASSISTED LAPAROSCOPIC NEPHRECTOMY;  Surgeon: Lucas Mallow, MD;  Location: [facility];  Service: Urology;  Laterality: Left;    There were no vitals filed for this visit.   Subjective Assessment - 03/03/21 0940     Subjective [name] did an xray of my tailbone. The x-ray was negative. Coconut oil helping with the vaginal dryness    Patient Stated Goals reduce pain, improve balance, return to exercise    Currently in Pain? Yes  Pain Score 6     Pain Location Rectum    Pain Orientation Mid    Pain Descriptors / Indicators Pressure    Pain Type Chronic pain    Pain Onset More than a month ago    Pain Frequency Constant    Aggravating Factors  bowel movement, walking, sitting    Pain Relieving Factors laying down to rest    Multiple Pain Sites Yes    Pain Score 8    Pain Location Vagina    Pain Orientation Mid    Pain Descriptors / Indicators Heaviness;Other (Comment)   left > right   Pain Type Chronic pain    Pain Onset More than a month ago    Pain Frequency Constant    Aggravating Factors  walking, exercise    Pain Relieving Factors sit and rest                               [facility] Adult PT Treatment/Exercise -  03/03/21 0001       Self-Care   Self-Care Other Self-Care Comments    Other Self-Care Comments  educated and gave patient links for you tube pelvic floro meditation; educated patient on lymph drainage for the heaviness in her legs; educated patient about aquatic therapy and how it will be beneficial for her      Manual Therapy   Manual Therapy Soft tissue mobilization;Myofascial release    Manual therapy comments educated patient on how to perform manual work to the hip adductors, perinela area, along the gluteal fold, and lifting up the buttocks to start home program for lengthening the mucscles    Soft tissue mobilization manual work ot the lower abdominal scars and along the sides of the scar, lifting up of the intestines from the bladder, scar massage to loosen the tissue    Myofascial Release fascial release around the bladder, patient feels the urge to urinate when performing manaual work on the bladder                     PT Education - 03/03/21 1034     Education Details education on pool therapy and where it is; you tube videos for meditation and perineal massage    Person(s) Educated Patient    Methods Explanation;Demonstration;Verbal cues;Handout    Comprehension Returned demonstration;Verbalized understanding              PT Short Term Goals - 03/03/21 1042       PT SHORT TERM GOAL #1   Title indpendent with hip stretches; diaphragmatic breathing; and scar massage    Baseline needs to work on diaphragmatic breathing    Time 4    Period Weeks    Status On-going      PT SHORT TERM GOAL #2   Title understand correct toileting techniques to reduce straining with bowel movements and empty the bowel completely    Time 4    Period Weeks    Status On-going      PT SHORT TERM GOAL #3   Title able to fully empty her bladder due to the ability to relax her pelvic floor muscles and understands techniques to assist in emptying    Baseline doing better    Time  4    Period Weeks    Status On-going    Target Date 01/13/21  PT Long Term Goals - 12/16/20 2028       PT LONG TERM GOAL #1   Title independent with advanced HEP for improve strength with a gentle exercise routine    Time 12    Period Weeks    Status New    Target Date 03/10/21      PT LONG TERM GOAL #2   Title understand ways to manage her pain with breathing, body scanning, and medtiation    Time 12    Period Weeks    Status New    Target Date 03/10/21      PT LONG TERM GOAL #3   Title able to have a complete bowel movement every 2-3 days with pain decreased </= 1-2/10    Time 12    Period Weeks    Status New    Target Date 03/10/21      PT LONG TERM GOAL #4   Title urinary leakage with activity decreased >/= 50% due to the ability to fully relax the pelvic floor and contract >/= 3/5 strength    Time 12    Period Weeks    Status New    Target Date 03/10/21                   Plan - 03/03/21 1035     Clinical Impression Statement Patient is able to empty her bladder better. She feel the urge to urinate when the therapist presses suprapubically to perfrom manual work. Patient feels the coconut oil is helping her with the vaginal dryness. Her abdominal scar is softer and she is doing her home massage work. Patient is able to walk with less stiffness due to her doing her home stretches. She understands why she is to do meditation to reduce the muscle tightness. Patient will be doing aquatics program to lengthen, improve active range of motion and strength of her hips while helping her balance. She reports urinary leakag eand is wearing a pad. As her muscles lengthen this will help with her leakage. She has trouble kifting her buttocks off the bed due to the heviness she feels and strength of her hips. Patient will beneift from skilled therapy to reduce her pelvic pain so she will be able to exercise to improve her balance and core strength.     Personal Factors and Comorbidities Age;Comorbidity 3+;Fitness;Past/Current Experience;Time since onset of injury/illness/exacerbation    Comorbidities c-section x3; Hx of cervical cancer 11/24/1999; Hx of pulmonary emolus 10/09/2017; Hx o f renal cell cancer 09/19/2017; Lupus; insicional hernia repair 05/23/2019; radiacl lymphodenectomy 11/24/1999; cold knife cervical conizaaltion 10/07/1999    Examination-Activity Limitations Bed Mobility;Sit;Bend;Squat;Carry;Continence;Toileting;Lift;Locomotion Level;Transfers    Stability/Clinical Decision Making Evolving/Moderate complexity    Rehab Potential Good    PT Frequency 2x / week    PT Duration 12 weeks    PT Treatment/Interventions ADLs/Self Care Home Management;Aquatic Therapy;Electrical Stimulation;Therapeutic activities;Therapeutic exercise;Balance training;Neuromuscular re-education;Patient/family education;Manual techniques;Manual lymph drainage;Scar mobilization;Passive range of motion;Dry needling;Spinal Manipulations;Joint Manipulations    PT Next Visit Plan diaphragmatic breathing; pelvic floor meditation, pelvic floor manual work to lengthen the tissue, work around the coccyx and check mobitliy, work on toileting;  in aquatic therapy working on leg strength; hip flexibility;  and balance; Patient does not know how to swim;    PT Home Exercise Plan Access Code: [email or web address]    Consulted and Agree with Plan of Care Patient             Patient will benefit from skilled therapeutic intervention  in order to improve the following deficits and impairments:  Decreased endurance, Decreased scar mobility, Decreased activity tolerance, Decreased strength, Increased fascial restricitons, Pain, Decreased balance, Decreased mobility, Increased muscle spasms, Difficulty walking, Decreased coordination, Decreased range of motion  Visit Diagnosis: Muscle weakness (generalized)  Cramp and spasm  Urinary incontinence, unspecified type  Rectal  pain     Problem List Patient Active Problem List   Diagnosis Date Noted   Insulin resistance 12/09/2020   Stage 3a chronic kidney disease ([place]) 10/29/2020   OSA on CPAP 01/16/2019   Rectocele 12/04/2017   History of pulmonary embolus (PE) 10/09/2017   History of renal cell carcinoma 10/09/2017   H/O left nephrectomy 10/09/2017   Depression 10/09/2017   Anxiety 10/09/2017   RLS (restless legs syndrome) 01/24/2015   Morbid obesity ([place])- BMI 30+ with OSA 05/28/2014   SUI (stress urinary incontinence, female) 05/28/2014   Medication management 05/28/2014   Hypertension    Abnormal glucose (prediabetes)    Hyperlipidemia    Vitamin D deficiency    Environmental allergies 07/14/2006   Chronic constipation 07/14/2006   Lupus (systemic lupus erythematosus) ([place]) 07/14/2006    Earlie Counts, PT 03/03/21 10:44 AM  [facility] [address] [place], [place], [address] Phone: [phone]   Fax:  [phone]  Name: Voula Waln MRN: [id number] Date of Birth: 01/31/1956

## 2021-03-04 ENCOUNTER — Ambulatory Visit: Payer: BC Managed Care – PPO | Attending: Internal Medicine | Admitting: Physical Therapy

## 2021-03-04 ENCOUNTER — Encounter: Payer: Self-pay | Admitting: Physical Therapy

## 2021-03-04 DIAGNOSIS — K6289 Other specified diseases of anus and rectum: Secondary | ICD-10-CM | POA: Insufficient documentation

## 2021-03-04 DIAGNOSIS — M6281 Muscle weakness (generalized): Secondary | ICD-10-CM | POA: Insufficient documentation

## 2021-03-04 DIAGNOSIS — R252 Cramp and spasm: Secondary | ICD-10-CM | POA: Insufficient documentation

## 2021-03-04 DIAGNOSIS — R32 Unspecified urinary incontinence: Secondary | ICD-10-CM | POA: Insufficient documentation

## 2021-03-04 NOTE — Therapy (Addendum)
[facility] @ [place], [place], [address] Phone: [phone]   Fax:  [phone]  Physical Therapy Treatment  Patient Details  Name: Beth [name] MRN: [id number] Date of Birth: March 20, 1956 Referring Provider (PT): Dr. [name]   Encounter Date: 03/04/2021   PT End of Session - 03/04/21 1156     Visit Number 4    Date for PT Re-Evaluation 04/17/21    Authorization Type BCBS    PT Start Time 0814   pt 10 min late   PT Stop Time 1215    PT Time Calculation (min) 19 min    Activity Tolerance Patient tolerated treatment well;No increased pain    Behavior During Therapy WFL for tasks assessed/performed             Past Medical History:  Diagnosis Date   Anxiety    Chronic constipation    followed by dr Carlean Purl   CKD (chronic kidney disease), stage III ([place])    Enterocele    History of cervical cancer    hx squamous cell cervical carcinoma,   s/p  radical hysterectomy 11-24-1999   History of MRSA infection    2005--- buttock abscess   History of pulmonary embolus (PE)    post op 10-09-2017 (surgery on 09-19-2017)  lower range pulmonary artery,  resolved and completed 6 month Eliquis   History of renal cell carcinoma    s/p  left nephrectomy 09-19-2017  (oncologist -- dr Alen Blew / urologsit-- dr bell)   History of seizure    10/ 2001  one episode per discharge note in epic, 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 (pt had just been dx with lupus);  (05-21-2019 per pt none since)   Incisional hernia    MDD (major depressive disorder)    OA (osteoarthritis)    OSA on CPAP    followed by dr dohmeier   Pre-diabetes    Rectocele    RLS (restless legs syndrome) 01/24/2015   Per sleep study 05/2014    Solitary right kidney    acquired 09-19-2017 s/p left nephrectomy   SUI (stress urinary incontinence, female)    05-21-2019  improved with therapy   Systemic lupus  erythematosus ([place])    rheumotologist--- dr Amil Amen (lov 03-19-2019 in epic)   Vitamin D deficiency    Wears glasses     Past Surgical History:  Procedure Laterality Date   CESAREAN SECTION  x3   last one 1980   COLD KNIFE CERVICAL CONIZATION   10/07/1999  @[facility]    COLONOSCOPY     negative per pt, Dr. Alice Reichert - [facility]   EXPLORATORY LAPAROTOMY MODIFIED RADICAL HYSTERECTOMY WITH PELVIC LYMPHADENECTOMY AND RESECTION MECKEL'S DIVERTICULUM  11-24-1999 dr Aldean Ast  @[facility]    INCISIONAL HERNIA REPAIR N/A 05/23/2019   Procedure: LAPAROSCOPIC INCISIONAL HERNIA REPAIR WITH [place];  Surgeon: Kinsinger, Arta Bruce, MD;  Location: [place];  Service: General;  Laterality: N/A;   LAPAROSCOPIC NEPHRECTOMY, HAND ASSISTED Left 09/19/2017   Procedure: LEFT HAND ASSISTED LAPAROSCOPIC NEPHRECTOMY;  Surgeon: Lucas Mallow, MD;  Location: [facility];  Service: Urology;  Laterality: Left;    There were no vitals filed for this visit.   Subjective Assessment - 03/04/21 1159     Subjective I was rushing to get to the pool today    Currently in Pain? Yes    Pain Score 8  Treatment: Patient seen for aquatic therapy today.  Treatment took place in water 2.5-4 feet deep depending upon activity.  Pt entered the pool via steps, slow step to step with mild use of hand rails. Pt requires buoyancy of water for support and to offload joints with strengthening exercises.  Water temp 94 degrees F.  Seated water bench with 75% submersion Pt performed seated LE AROM exercises 20x in all planes, concurrent pt education of water principles: no further treatment was provided as the fire alarm went off in the building and all patients and staff had to evacuate.                             PT Short Term Goals - 03/03/21 1042       PT SHORT TERM GOAL #1   Title indpendent with hip stretches; diaphragmatic breathing; and scar massage    Baseline needs to work on diaphragmatic  breathing    Time 4    Period Weeks    Status On-going      PT SHORT TERM GOAL #2   Title understand correct toileting techniques to reduce straining with bowel movements and empty the bowel completely    Time 4    Period Weeks    Status On-going      PT SHORT TERM GOAL #3   Title able to fully empty her bladder due to the ability to relax her pelvic floor muscles and understands techniques to assist in emptying    Baseline doing better    Time 4    Period Weeks    Status On-going    Target Date 01/13/21                      Plan - 03/04/21 1446     Clinical Impression Statement Pt was 15 min late to first aquatic session, then before treatment was able to progress much the fire alarm went off and we had to evacuate the building. pt was educated in water principles and performed her seated LE AROM exercises only. tretament was unable to be resumed.    Personal Factors and Comorbidities Age;Comorbidity 3+;Fitness;Past/Current Experience;Time since onset of injury/illness/exacerbation    Comorbidities c-section x3; Hx of cervical cancer 11/24/1999; Hx of pulmonary emolus 10/09/2017; Hx o f renal cell cancer 09/19/2017; Lupus; insicional hernia repair 05/23/2019; radiacl lymphodenectomy 11/24/1999; cold knife cervical conizaaltion 10/07/1999    Examination-Activity Limitations Bed Mobility;Sit;Bend;Squat;Carry;Continence;Toileting;Lift;Locomotion Level;Transfers    Examination-Participation Restrictions Cleaning;Community Activity    Stability/Clinical Decision Making Evolving/Moderate complexity    Rehab Potential Good    PT Frequency 2x / week    PT Duration 12 weeks    PT Treatment/Interventions ADLs/Self Care Home Management;Aquatic Therapy;Electrical Stimulation;Therapeutic activities;Therapeutic exercise;Balance training;Neuromuscular re-education;Patient/family education;Manual techniques;Manual lymph drainage;Scar mobilization;Passive range of motion;Dry needling;Spinal  Manipulations;Joint Manipulations    PT Next Visit Plan diaphragmatic breathing; pelvic floor meditation, pelvic floor manual work to lengthen the tissue, work around the coccyx and check mobitliy, work on toileting;  in aquatic therapy working on leg strength; hip flexibility;  and balance; Patient does not know how to swim;    PT Home Exercise Plan Access Code: [email or web address]    DUKGURKYH and Agree with Plan of Care Patient             Patient will benefit from skilled therapeutic intervention in order to improve the following deficits and impairments:  Decreased endurance, Decreased scar mobility, Decreased activity  tolerance, Decreased strength, Increased fascial restricitons, Pain, Decreased balance, Decreased mobility, Increased muscle spasms, Difficulty walking, Decreased coordination, Decreased range of motion  Visit Diagnosis: Muscle weakness (generalized)  Cramp and spasm  Urinary incontinence, unspecified type  Rectal pain     Problem List Patient Active Problem List   Diagnosis Date Noted   Insulin resistance 12/09/2020   Stage 3a chronic kidney disease ([place]) 10/29/2020   OSA on CPAP 01/16/2019   Rectocele 12/04/2017   History of pulmonary embolus (PE) 10/09/2017   History of renal cell carcinoma 10/09/2017   H/O left nephrectomy 10/09/2017   Depression 10/09/2017   Anxiety 10/09/2017   RLS (restless legs syndrome) 01/24/2015   Morbid obesity ([place])- BMI 30+ with OSA 05/28/2014   SUI (stress urinary incontinence, female) 05/28/2014   Medication management 05/28/2014   Hypertension    Abnormal glucose (prediabetes)    Hyperlipidemia    Vitamin D deficiency    Environmental allergies 07/14/2006   Chronic constipation 07/14/2006   Lupus (systemic lupus erythematosus) ([place]) 07/14/2006    Shonna Deiter, PTA 03/04/2021, 2:51 PM  [facility] @ [place], [place], [address] Phone: [phone]    Fax:  [phone]  Name: Beth [name] MRN: [id number] Date of Birth: 22-Aug-1955

## 2021-03-05 ENCOUNTER — Encounter: Payer: BC Managed Care – PPO | Admitting: Physical Therapy

## 2021-03-10 ENCOUNTER — Encounter: Payer: Self-pay | Admitting: Physical Therapy

## 2021-03-10 ENCOUNTER — Encounter: Payer: BC Managed Care – PPO | Admitting: Physical Therapy

## 2021-03-10 ENCOUNTER — Other Ambulatory Visit: Payer: Self-pay

## 2021-03-10 ENCOUNTER — Encounter: Payer: Self-pay | Admitting: Obstetrics and Gynecology

## 2021-03-10 ENCOUNTER — Ambulatory Visit (INDEPENDENT_AMBULATORY_CARE_PROVIDER_SITE_OTHER): Payer: BC Managed Care – PPO | Admitting: Obstetrics and Gynecology

## 2021-03-10 VITALS — BP 125/78 | HR 73 | Wt 201.0 lb

## 2021-03-10 DIAGNOSIS — M6281 Muscle weakness (generalized): Secondary | ICD-10-CM

## 2021-03-10 DIAGNOSIS — R252 Cramp and spasm: Secondary | ICD-10-CM

## 2021-03-10 DIAGNOSIS — M62838 Other muscle spasm: Secondary | ICD-10-CM

## 2021-03-10 DIAGNOSIS — R32 Unspecified urinary incontinence: Secondary | ICD-10-CM

## 2021-03-10 DIAGNOSIS — K6289 Other specified diseases of anus and rectum: Secondary | ICD-10-CM

## 2021-03-10 MED ORDER — TRIAMCINOLONE ACETONIDE 40 MG/ML IJ SUSP
40.0000 mg | Freq: Once | INTRAMUSCULAR | Status: AC
  Administered 2021-03-10: 40 mg via INTRAMUSCULAR

## 2021-03-10 MED ORDER — BUPIVACAINE HCL 0.25 % IJ SOLN
9.0000 mL | Freq: Once | INTRAMUSCULAR | Status: AC
  Administered 2021-03-10: 9 mL

## 2021-03-10 NOTE — Therapy (Signed)
[place] at [facility] [address], [place], [place], [address] Phone: [phone]   Fax:  [phone]  Physical Therapy Treatment  Patient Details  Name: Beth [name] MRN: [id number] Date of Birth: [birth year] Referring Provider (PT): Dr. [name]   Encounter Date: 03/10/2021   PT End of Session - 03/10/21 1056     Visit Number 5    Date for PT Re-Evaluation 04/17/21    Authorization Type BC[employer]    PT Start Time 1030    PT Stop Time 1120    PT Time Calculation (min) 50 min    Activity Tolerance Patient tolerated treatment well;No increased pain    Behavior During Therapy WFL for tasks assessed/performed             Past Medical History:  Diagnosis Date   Anxiety    Chronic constipation    followed by dr Carlean Purl   CKD (chronic kidney disease), stage III ([place])    Enterocele    History of cervical cancer    hx squamous cell cervical carcinoma,   s/p  radical hysterectomy 11-24-1999   History of MRSA infection    2005--- buttock abscess   History of pulmonary embolus (PE)    post op 10-09-2017 (surgery on 09-19-2017)  lower range pulmonary artery,  resolved and completed 6 month Eliquis   History of renal cell carcinoma    s/p  left nephrectomy 09-19-2017  (oncologist -- dr Alen Blew / urologsit-- dr bell)   History of seizure    10/ 2001  one episode per discharge note in epic, 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 (pt had just been dx with lupus);  (05-21-2019 per pt none since)   Incisional hernia    MDD (major depressive disorder)    OA (osteoarthritis)    OSA on CPAP    followed by dr dohmeier   Pre-diabetes    Rectocele    RLS (restless legs syndrome) 01/24/2015   Per sleep study 05/2014    Solitary right kidney    acquired 09-19-2017 s/p left nephrectomy   SUI (stress urinary incontinence, female)    05-21-2019  improved with therapy   Systemic lupus erythematosus  ([place])    rheumotologist--- dr Amil Amen (lov 03-19-2019 in epic)   Vitamin D deficiency    Wears glasses     Past Surgical History:  Procedure Laterality Date   CESAREAN SECTION  x3   last one 1980   COLD KNIFE CERVICAL CONIZATION   10/07/1999  @[facility]    COLONOSCOPY     negative per pt, Dr. Alice Reichert - [facility]   EXPLORATORY LAPAROTOMY MODIFIED RADICAL HYSTERECTOMY WITH PELVIC LYMPHADENECTOMY AND RESECTION MECKEL'S DIVERTICULUM  11-24-1999 dr Aldean Ast  @[facility]    INCISIONAL HERNIA REPAIR N/A 05/23/2019   Procedure: LAPAROSCOPIC INCISIONAL HERNIA REPAIR WITH MESH;  Surgeon: Kinsinger, Arta Bruce, MD;  Location: [facility];  Service: General;  Laterality: N/A;   LAPAROSCOPIC NEPHRECTOMY, HAND ASSISTED Left 09/19/2017   Procedure: LEFT HAND ASSISTED LAPAROSCOPIC NEPHRECTOMY;  Surgeon: Lucas Mallow, MD;  Location: [facility];  Service: Urology;  Laterality: Left;    There were no vitals filed for this visit.   Subjective Assessment - 03/10/21 1035     Subjective I have less pain.    Patient Stated Goals reduce pain, improve balance, return to exercise    Currently in Pain? Yes    Pain Score 5     Pain Location Rectum  Pain Orientation Mid    Pain Descriptors / Indicators Pressure    Pain Type Chronic pain    Pain Onset More than a month ago    Pain Frequency Constant    Aggravating Factors  bowel movement, walking, sitting    Pain Relieving Factors laying down to rest    Multiple Pain Sites Yes    Pain Score 8    Pain Location Vagina    Pain Orientation Mid    Pain Descriptors / Indicators Heaviness;Other (Comment)   left>right   Pain Type Chronic pain    Pain Onset More than a month ago    Pain Frequency Constant    Aggravating Factors  walking, exercise    Pain Relieving Factors sit and rest                            Pelvic Floor Special Questions - 03/10/21 0001     Pelvic Floor Internal Exam Patient confirms identification and approves PT to  assess and treat the pelvic floor    Exam Type Rectal               [facility] Adult PT Treatment/Exercise - 03/10/21 0001       Neuro Re-ed    Neuro Re-ed Details  Pelvic floor meditation to relax the body and change the brain with how it thinks of pain; diaphragmatic breathing to expanding the lower rib cage and having the air go into the pelvic floor      Manual Therapy   Manual Therapy Internal Pelvic Floor    Internal Pelvic Floor manual work on thelevator ani, coccygeus, around the coccyx, obturator internist with one finger internally and other externally and monitoring for pian no more than 3/10 in left sidely                     PT Education - 03/10/21 1056     Education Details You tube video for meditation and relax the muscles    Person(s) Educated Patient    Methods Explanation;Demonstration;Other (comment)   sent you tube video link   Comprehension Returned demonstration;Verbalized understanding              PT Short Term Goals - 03/10/21 1125       PT SHORT TERM GOAL #1   Title indpendent with hip stretches; diaphragmatic breathing; and scar massage    Time 4    Period Weeks    Status Achieved      PT SHORT TERM GOAL #2   Title understand correct toileting techniques to reduce straining with bowel movements and empty the bowel completely    Time 4    Period Weeks    Status On-going      PT SHORT TERM GOAL #3   Title able to fully empty her bladder due to the ability to relax her pelvic floor muscles and understands techniques to assist in emptying    Baseline doing better    Time 4    Period Weeks    Status On-going    Target Date 01/13/21               PT Long Term Goals - 12/16/20 2028       PT LONG TERM GOAL #1   Title independent with advanced HEP for improve strength with a gentle exercise routine    Time 12    Period Weeks  Status New    Target Date 03/10/21      PT LONG TERM GOAL #2   Title understand ways to  manage her pain with breathing, body scanning, and medtiation    Time 12    Period Weeks    Status New    Target Date 03/10/21      PT LONG TERM GOAL #3   Title able to have a complete bowel movement every 2-3 days with pain decreased </= 1-2/10    Time 12    Period Weeks    Status New    Target Date 03/10/21      PT LONG TERM GOAL #4   Title urinary leakage with activity decreased >/= 50% due to the ability to fully relax the pelvic floor and contract >/= 3/5 strength    Time 12    Period Weeks    Status New    Target Date 03/10/21                   Plan - 03/10/21 1057     Clinical Impression Statement Patient rectal pain decreased from 8/10 to 3/10. Patient is now able to perform diaphragmatic breathing and have the air go iinto to pelvic floor. She has injections into the levator muscle prior to therapy and this helped her muscles relax further with the manual work. After the manual work she was able to walk easier and had less pressure. the coccyx was able tomove wiht greater ease. Patient will benefit from skilled therapy to reduce her muscle spasms to reduce rectal pain and vaginal heaviness.    Personal Factors and Comorbidities Age;Comorbidity 3+;Fitness;Past/Current Experience;Time since onset of injury/illness/exacerbation    Comorbidities c-section x3; Hx of cervical cancer 11/24/1999; Hx of pulmonary emolus 10/09/2017; Hx o f renal cell cancer 09/19/2017; Lupus; insicional hernia repair 05/23/2019; radiacl lymphodenectomy 11/24/1999; cold knife cervical conizaaltion 10/07/1999    Examination-Activity Limitations Bed Mobility;Sit;Bend;Squat;Carry;Continence;Toileting;Lift;Locomotion Level;Transfers    Examination-Participation Restrictions Cleaning;Community Activity    Stability/Clinical Decision Making Evolving/Moderate complexity    Rehab Potential Good    PT Frequency 2x / week    PT Duration 12 weeks    PT Treatment/Interventions ADLs/Self Care Home  Management;Aquatic Therapy;Electrical Stimulation;Therapeutic activities;Therapeutic exercise;Balance training;Neuromuscular re-education;Patient/family education;Manual techniques;Manual lymph drainage;Scar mobilization;Passive range of motion;Dry needling;Spinal Manipulations;Joint Manipulations    PT Next Visit Plan pelvic floor manual work to lengthen the tissue, work around the coccyx and check mobitliy, work on toileting, check measurements for hip extension and external rotation, ask if she is able to empty her bladder easier;  in aquatic therapy working on leg strength; hip flexibility;  and balance; Patient does not know how to swim;    PT Home Exercise Plan Access Code: [email or web address]    Consulted and Agree with Plan of Care Patient             Patient will benefit from skilled therapeutic intervention in order to improve the following deficits and impairments:  Decreased endurance, Decreased scar mobility, Decreased activity tolerance, Decreased strength, Increased fascial restricitons, Pain, Decreased balance, Decreased mobility, Increased muscle spasms, Difficulty walking, Decreased coordination, Decreased range of motion  Visit Diagnosis: Muscle weakness (generalized)  Cramp and spasm  Urinary incontinence, unspecified type  Rectal pain     Problem List Patient Active Problem List   Diagnosis Date Noted   Insulin resistance 12/09/2020   Stage 3a chronic kidney disease ([place]) 10/29/2020   OSA on CPAP 01/16/2019   Rectocele 12/04/2017   History of pulmonary  embolus (PE) 10/09/2017   History of renal cell carcinoma 10/09/2017   H/O left nephrectomy 10/09/2017   Depression 10/09/2017   Anxiety 10/09/2017   RLS (restless legs syndrome) 01/24/2015   Morbid obesity ([place])- BMI 30+ with OSA 05/28/2014   SUI (stress urinary incontinence, female) 05/28/2014   Medication management 05/28/2014   Hypertension    Abnormal glucose (prediabetes)    Hyperlipidemia    Vitamin D  deficiency    Environmental allergies 07/14/2006   Chronic constipation 07/14/2006   Lupus (systemic lupus erythematosus) ([place]) 07/14/2006    Earlie Counts, PT 03/10/21 11:27 AM  [facility] at [facility] [address] [place], [place], [address] Phone: [phone]   Fax:  [phone]  Name: Beth [name] MRN: [id number] Date of Birth: [birth year]

## 2021-03-10 NOTE — Progress Notes (Signed)
Ms. Beth [name] is a 65 y.o. female who presents for levator trigger point injection, Series #1  Previously prescribed Gemtesa for OAB symptoms. She did not pick it up and does not want medication at this time.  Vitals:   03/10/21 0930  BP: 125/78  Pulse: 73     Indication(s): Levator spasm.   Informed Consent:  The alternatives, risks and benefits of the procedure were explained to the patient. Risks including, but not limited to discomfort, pain, bleeding, infection, injury to nearby structures, inability to perform the procedure, failure of the procedure were discussed.  All questions were answered and the patient elected to proceed.  Procedure:   The patient was positioned in dorsal lithotomy position.  The vaginal tissues were prepped with Hibiclens solution.  An injection of 10cc of a mixture of 90% anesthetic (0.25% Bupivacaine) and 10% 40mg /ml Triamcinolone acetomide (Kenalog) was performed in multiple in the bilateral levator muscles, a total of 6 injection sites.  Pressure was held over bleeding areas until good hemostasis was achieved.   The patient tolerated the procedure well with no apparent complications.  [name], MD

## 2021-03-12 ENCOUNTER — Encounter: Payer: BC Managed Care – PPO | Admitting: Physical Therapy

## 2021-03-15 ENCOUNTER — Other Ambulatory Visit: Payer: Self-pay | Admitting: Adult Health

## 2021-03-16 NOTE — Progress Notes (Signed)
Ms. Beth [name] is a 65 y.o. female who presents for levator trigger point injection, Series #2  She feels slight improvement in her pain from last session.   Vitals:   03/17/21 1042  BP: 137/79  Pulse: 76    Indication(s): Levator spasm.   Informed Consent:  The alternatives, risks and benefits of the procedure were explained to the patient. Risks including, but not limited to discomfort, pain, bleeding, infection, injury to nearby structures, inability to perform the procedure, failure of the procedure were discussed.  All questions were answered and the patient elected to proceed.  Procedure:   The patient was positioned in dorsal lithotomy position.  The vaginal tissues were prepped with Hibiclens solution.  An injection of 10cc of a mixture of 90% anesthetic (0.25% Bupivacaine) and 10% 40mg /ml Triamcinolone acetomide (Kenalog) was performed in multiple in the bilateral levator muscles, a total of 5 injection sites.  Pressure was held over bleeding areas until good hemostasis was achieved.   The patient tolerated the procedure well with no apparent complications.  [name], MD

## 2021-03-17 ENCOUNTER — Encounter: Payer: BC Managed Care – PPO | Attending: Obstetrics and Gynecology | Admitting: Physical Therapy

## 2021-03-17 ENCOUNTER — Encounter: Payer: Self-pay | Admitting: Obstetrics and Gynecology

## 2021-03-17 ENCOUNTER — Ambulatory Visit (INDEPENDENT_AMBULATORY_CARE_PROVIDER_SITE_OTHER): Payer: BC Managed Care – PPO | Admitting: Obstetrics and Gynecology

## 2021-03-17 ENCOUNTER — Other Ambulatory Visit: Payer: Self-pay

## 2021-03-17 ENCOUNTER — Encounter: Payer: Self-pay | Admitting: Physical Therapy

## 2021-03-17 VITALS — BP 137/79 | HR 76

## 2021-03-17 DIAGNOSIS — R252 Cramp and spasm: Secondary | ICD-10-CM | POA: Diagnosis present

## 2021-03-17 DIAGNOSIS — R32 Unspecified urinary incontinence: Secondary | ICD-10-CM | POA: Diagnosis present

## 2021-03-17 DIAGNOSIS — M6281 Muscle weakness (generalized): Secondary | ICD-10-CM | POA: Insufficient documentation

## 2021-03-17 DIAGNOSIS — K6289 Other specified diseases of anus and rectum: Secondary | ICD-10-CM | POA: Insufficient documentation

## 2021-03-17 DIAGNOSIS — M62838 Other muscle spasm: Secondary | ICD-10-CM | POA: Diagnosis not present

## 2021-03-17 MED ORDER — BUPIVACAINE HCL 0.25 % IJ SOLN
9.0000 mL | Freq: Once | INTRAMUSCULAR | Status: AC
  Administered 2021-03-17: 9 mL

## 2021-03-17 MED ORDER — TRIAMCINOLONE ACETONIDE 40 MG/ML IJ SUSP
40.0000 mg | Freq: Once | INTRAMUSCULAR | Status: AC
  Administered 2021-03-17: 40 mg via INTRAMUSCULAR

## 2021-03-17 NOTE — Patient Instructions (Addendum)
Toileting Techniques for Bowel Movements    An Evacuation/Defecation Plan   Here are the 4 basic points:  Lean forward enough for your elbows to rest on your knees Support your feet on the floor or use a low stool if your feet don't touch the floor  Push out your belly as if you have swallowed a beach ball--you should feel a widening of your waist. "Belly Big, Belly Hard" Open and relax your pelvic floor muscles, rather than tightening around the anus  While you are sitting on the toilet pay attention to the following areas: Jaw and mouth position- relaxed not clenched Angle of your hips - leaning slightly forward Whether your feet touch the ground or not - should be flat and supported Arm placement - rest against your thighs Spine position - flat back Waist Breathing - exhale as you push (like blowing up a balloon or try using other sounds such as ahhhh, shhhhh, ohhhh or grrrrrrr) Beulah Gandy - hard and tight as you push Anus (opening of the anal canal) - relaxed and open as you push Anus - Tighten and lift pulling the muscle back in after you are done or if taking a break  If you are not successful after 10-15 minutes, try again later.  Avoid negative self-talk about your toileting experience.   Read this for more details and ask your PT if you need suggestions for adjustments or limitations:  Sitting on the toilet  a) Make sure your feet are supported - flat on the floor or step stool b) Many people find it effective to lean forward or raise their knees.  Propping your feet on a step stool (Squatty Potty is a brand name) can help the muscles around the anus to relax  c) When you lean forward, place your forearms on your thighs for support  Relaxing Breathe deeply and slowly in through your nose and out through your mouth. To become aware of how to relax your muscles, contracting and releasing muscles can be helpful.  Pull your pelvic floor muscles in tightly by using the image  of holding back gas, or closing around the anus (visualize making a circle smaller) and lifting the anus up and in.  Then release the muscles and your anus should drop down and feel open. Repeat 5 times ending with the feeling of relaxation. Keep your pelvic floor muscles relaxed; let your belly bulge out. The digestive tract starts at the mouth and ends at the anal opening, so be sure to relax both ends of the tube.  Place your tongue on the roof of your mouth with your teeth separated.  This helps relax your mouth and will help to relax the anus at the same time.  Emptying (defecation) a) Keep your pelvic floor and sphincter relaxed, then bulge your anal muscles.  Make the anal opening wide.  b) Stick your belly out as if you have swallowed a beach ball. c) Make your belly wall hard using your belly muscles while continuing to breathe. Doing this makes it easier to open your anus. d) Breath out and give a grunt (or try using other sounds such as ahhhh, shhhhh, ohhhh or grrrrrrr). e)  Can also try to act as if you are blowing up a balloon as you push    4) Finishing a) As you finish your bowel movement, pull the pelvic floor muscles up and in.  This will leave your anus in the proper place rather than remaining pushed  out and down. If you leave your anus pushed out and down, it will start to feel as though that is normal and give you incorrect signals about needing to have a bowel movement.   Earlie Counts, PT [facility] [address] [place], [place] [address] W: [phone] [email or web address]

## 2021-03-17 NOTE — Therapy (Signed)
[place] at [facility] [address] [place], [place], [address] Phone: [phone]   Fax:  [phone]  Physical Therapy Treatment  Patient Details  Name: Rhiana Morash MRN: [id number] Date of Birth: [birth year] Referring Provider (PT): Dr. [name]   Encounter Date: 03/17/2021   PT End of Session - 03/17/21 1231     Visit Number 6    Date for PT Re-Evaluation 04/17/21    Authorization Type [employer]    PT Start Time 1130    PT Stop Time 1220    PT Time Calculation (min) 50 min    Activity Tolerance Patient tolerated treatment well;No increased pain    Behavior During Therapy WFL for tasks assessed/performed             Past Medical History:  Diagnosis Date   Anxiety    Chronic constipation    followed by dr Carlean Purl   CKD (chronic kidney disease), stage III ([place])    Enterocele    History of cervical cancer    hx squamous cell cervical carcinoma,   s/p  radical hysterectomy 11-24-1999   History of MRSA infection    2005--- buttock abscess   History of pulmonary embolus (PE)    post op 10-09-2017 (surgery on 09-19-2017)  lower range pulmonary artery,  resolved and completed 6 month Eliquis   History of renal cell carcinoma    s/p  left nephrectomy 09-19-2017  (oncologist -- dr Alen Blew / urologsit-- dr bell)   History of seizure    10/ 2001  one episode per discharge note in epic, 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 (pt had just been dx with lupus);  (05-21-2019 per pt none since)   Incisional hernia    MDD (major depressive disorder)    OA (osteoarthritis)    OSA on CPAP    followed by dr dohmeier   Pre-diabetes    Rectocele    RLS (restless legs syndrome) 01/24/2015   Per sleep study 05/2014    Solitary right kidney    acquired 09-19-2017 s/p left nephrectomy   SUI (stress urinary incontinence, female)    05-21-2019  improved with therapy   Systemic lupus erythematosus  ([place])    rheumotologist--- dr Amil Amen (lov 03-19-2019 in epic)   Vitamin D deficiency    Wears glasses     Past Surgical History:  Procedure Laterality Date   CESAREAN SECTION  x3   last one 1980   COLD KNIFE CERVICAL CONIZATION   10/07/1999  @[facility]    COLONOSCOPY     negative per pt, Dr. Alice Reichert - [facility]   EXPLORATORY LAPAROTOMY MODIFIED RADICAL HYSTERECTOMY WITH PELVIC LYMPHADENECTOMY AND RESECTION MECKEL'S DIVERTICULUM  11-24-1999 dr Aldean Ast  @[facility]    INCISIONAL HERNIA REPAIR N/A 05/23/2019   Procedure: LAPAROSCOPIC INCISIONAL HERNIA REPAIR WITH MESH;  Surgeon: Kinsinger, Arta Bruce, MD;  Location: [facility];  Service: General;  Laterality: N/A;   LAPAROSCOPIC NEPHRECTOMY, HAND ASSISTED Left 09/19/2017   Procedure: LEFT HAND ASSISTED LAPAROSCOPIC NEPHRECTOMY;  Surgeon: Lucas Mallow, MD;  Location: [facility];  Service: Urology;  Laterality: Left;    There were no vitals filed for this visit.   Subjective Assessment - 03/17/21 1133     Subjective I just saw Dr. Wannetta Sender. I feel a slight difference. It may take 1-3 times more. I still feel like I can walk better. Rectal pain is decreased by 25%. Not as bad when sitting down. Using the metamucil daily.  I have a bowel movement daily. Easier to empty her bladder and easier to control the urinary leakage better. Patient is able to get tot the bathroom in time. wears 2 pads per day now.    Patient Stated Goals reduce pain, improve balance, return to exercise    Currently in Pain? Yes    Pain Score 2     Pain Location Rectum    Pain Orientation Mid    Pain Descriptors / Indicators Sore    Pain Type Chronic pain    Pain Onset More than a month ago    Pain Frequency Constant    Aggravating Factors  bowel movement, walking, sitting is less    Pain Relieving Factors laying down ot rest    Multiple Pain Sites Yes    Pain Score 5    Pain Location Vagina    Pain Orientation Mid    Pain Descriptors / Indicators Heaviness   left  greater than right   Pain Type Chronic pain    Pain Onset More than a month ago    Pain Frequency Constant    Aggravating Factors  walking and exercise    Pain Relieving Factors sit and rest                               [facility] Adult PT Treatment/Exercise - 03/17/21 0001       Self-Care   Self-Care Other Self-Care Comments    Other Self-Care Comments  educated patient on supplements that soften stool to reduce pain with bowel movements      Therapeutic Activites    Therapeutic Activities Other Therapeutic Activities    Other Therapeutic Activities correct toileting technique using the squatty potty, correct breathing and relaxing shoulders      Neuro Re-ed    Neuro Re-ed Details  diaphragmatic breathing to prepare for pooping. Needed verbal cues to fill the abdomen due to her contracting and lifting her chest.      Manual Therapy   Manual Therapy Soft tissue mobilization;Myofascial release    Soft tissue mobilization to bilatearl gluteal, coccygeus, levator ani, around the SI joint and posterio to the greater trochanter    Myofascial Release using the suction cup to tbilateral gluteal and aournd the hips in prone; fascial work alon gthe umbilicus, alon gthe lower abdominal to rleease the tissue                     PT Education - 03/17/21 1230     Education Details educated patient on toileting and using other forms of supplements to soften her stool to reduce her pain.    Person(s) Educated Patient    Methods Explanation;Demonstration;Handout    Comprehension Verbalized understanding;Returned demonstration              PT Short Term Goals - 03/17/21 1235       PT SHORT TERM GOAL #2   Title understand correct toileting techniques to reduce straining with bowel movements and empty the bowel completely    Time 4    Period Weeks    Status Achieved      PT SHORT TERM GOAL #3   Title able to fully empty her bladder due to the ability to  relax her pelvic floor muscles and understands techniques to assist in emptying    Time 4    Period Weeks    Status Achieved  PT Long Term Goals - 12/16/20 2028       PT LONG TERM GOAL #1   Title independent with advanced HEP for improve strength with a gentle exercise routine    Time 12    Period Weeks    Status New    Target Date 03/10/21      PT LONG TERM GOAL #2   Title understand ways to manage her pain with breathing, body scanning, and medtiation    Time 12    Period Weeks    Status New    Target Date 03/10/21      PT LONG TERM GOAL #3   Title able to have a complete bowel movement every 2-3 days with pain decreased </= 1-2/10    Time 12    Period Weeks    Status New    Target Date 03/10/21      PT LONG TERM GOAL #4   Title urinary leakage with activity decreased >/= 50% due to the ability to fully relax the pelvic floor and contract >/= 3/5 strength    Time 12    Period Weeks    Status New    Target Date 03/10/21                   Plan - 03/17/21 1231     Clinical Impression Statement Patient reports she is able to walk to the commode without leaking urine. She is able to empty her bladder better. She is now able to use 2 pads in 24 hours. Patient reports her pain is decreased to 2/10 compared to 5/10 when she started therapy toeday. Patient is able to walk with increased in hip extension. She realizes when she is clenching her buttocks and knows how to release them. Patient had no pain in umbilicus after tissue releasing. Patient will benefit from skilled therapy to reduce her muscle spasms to reduce rectal pain and vaginal heaviness.    Personal Factors and Comorbidities Age;Comorbidity 3+;Fitness;Past/Current Experience;Time since onset of injury/illness/exacerbation    Comorbidities c-section x3; Hx of cervical cancer 11/24/1999; Hx of pulmonary emolus 10/09/2017; Hx o f renal cell cancer 09/19/2017; Lupus; insicional hernia repair  05/23/2019; radiacl lymphodenectomy 11/24/1999; cold knife cervical conizaaltion 10/07/1999    Examination-Activity Limitations Bed Mobility;Sit;Bend;Squat;Carry;Continence;Toileting;Lift;Locomotion Level;Transfers    Examination-Participation Restrictions Cleaning;Community Activity    Stability/Clinical Decision Making Evolving/Moderate complexity    Rehab Potential Good    PT Frequency 2x / week    PT Duration 12 weeks    PT Treatment/Interventions ADLs/Self Care Home Management;Aquatic Therapy;Electrical Stimulation;Therapeutic activities;Therapeutic exercise;Balance training;Neuromuscular re-education;Patient/family education;Manual techniques;Manual lymph drainage;Scar mobilization;Passive range of motion;Dry needling;Spinal Manipulations;Joint Manipulations    PT Next Visit Plan pelvic floor manual work to lengthen the tissue, work around the coccyx and check mobitliy, , check measurements for hip extension and external rotation,  in aquatic therapy working on leg strength; hip flexibility;  and balance; Patient does not know how to swim;    PT Home Exercise Plan Access Code: [email or web address]    Consulted and Agree with Plan of Care Patient             Patient will benefit from skilled therapeutic intervention in order to improve the following deficits and impairments:  Decreased endurance, Decreased scar mobility, Decreased activity tolerance, Decreased strength, Increased fascial restricitons, Pain, Decreased balance, Decreased mobility, Increased muscle spasms, Difficulty walking, Decreased coordination, Decreased range of motion  Visit Diagnosis: Muscle weakness (generalized)  Cramp and spasm  Urinary incontinence, unspecified type  Rectal pain     Problem List Patient Active Problem List   Diagnosis Date Noted   Insulin resistance 12/09/2020   Stage 3a chronic kidney disease ([place]) 10/29/2020   OSA on CPAP 01/16/2019   Rectocele 12/04/2017   History of pulmonary embolus (PE)  10/09/2017   History of renal cell carcinoma 10/09/2017   H/O left nephrectomy 10/09/2017   Depression 10/09/2017   Anxiety 10/09/2017   RLS (restless legs syndrome) 01/24/2015   Morbid obesity ([place])- BMI 30+ with OSA 05/28/2014   SUI (stress urinary incontinence, female) 05/28/2014   Medication management 05/28/2014   Hypertension    Abnormal glucose (prediabetes)    Hyperlipidemia    Vitamin D deficiency    Environmental allergies 07/14/2006   Chronic constipation 07/14/2006   Lupus (systemic lupus erythematosus) ([place]) 07/14/2006    Earlie Counts, PT 03/17/21 12:37 PM  [facility] at [facility] [address] [place], [place], [address] Phone: [phone]   Fax:  [phone]  Name: Naviyah Schaffert MRN: [id number] Date of Birth: 10-Jan-1956

## 2021-03-17 NOTE — Progress Notes (Signed)
Complete Physical  Assessment and Plan:   Encounter for Annual Physical Exam with abnormal findings Due annually  Health Maintenance reviewed Healthy lifestyle reviewed and goals set Schedule mammogram PAP with GYN  Essential hypertension - continue medications, DASH diet, exercise and monitor at home. Call if greater than 130/80.  -     CBC with Differential/Platelet -     COMPLETE METABOLIC PANEL WITH GFR -     TSH -     Urinalysis, Routine w reflex microscopic -     Microalbumin / creatinine urine ratio  OSA (obstructive sleep apnea) Sleep apnea- continue CPAP, CPAP is helping with daytime fatigue, weight loss still advised.   History of renal cancer S/p nephrectomy and doing well [name] follows  H/O left nephrectomy Well healed and doing well Has very small ventral hernia, monitoring only   Systemic lupus erythematosus, unspecified SLE type, unspecified organ involvement status ([place]) Continue meds and follow up; [name]  Abnormal glucose Discussed disease progression and risks Discussed diet/exercise, weight management and risk modification -     Hemoglobin A1c  Hyperlipidemia, unspecified hyperlipidemia type check lipids decrease fatty foods increase activity.  -     Lipid panel  Vitamin D deficiency -     VITAMIN D 25 Hydroxy (Vit-D Deficiency, Fractures)  Morbid Obesity- BMI 30+ with OSA - follow up 3 months for progress monitoring - increase veggies, decrease carbs - lack benefit with phentermine - now on ozempic, tolerating, increase to 1 mg/week with close follow up - long discussion about weight loss, diet, and exercise  Medication management -     Magnesium  RLS (restless legs syndrome) Increase walking  Recurrent major depressive disorder, in partial remission (HCC) - continue medications, stress management techniques discussed, increase water, good sleep hygiene discussed, increase exercise, and increase veggies.   Anxiety Continue  meds, trying gabapentin with good results Try lower dose 200 mg and compare to 300 mg  History of pulmonary embolus (PE) Provoked, off anticoagulant at this time though patient reminds high risk with lupus, will monitor closely.   Rectocele/stress incontinence  She is following with [name] Doing pelvic floor PT/injections  Orders Placed This Encounter  Procedures   CBC with Differential/Platelet   COMPLETE METABOLIC PANEL WITH GFR   Magnesium   Lipid panel   TSH   Hemoglobin A1c   VITAMIN D 25 Hydroxy (Vit-D Deficiency, Fractures)   Microalbumin / creatinine urine ratio   Urinalysis, Routine w reflex microscopic   EKG 12-Lead    Discussed med's effects and SE's. Screening labs and tests as requested with regular follow-up as recommended. Future Appointments  Date Time Provider [place]  03/24/2021 11:30 AM [name], PT [facility]  03/25/2021  8:00 AM [name], PTA [facility] None  03/25/2021 10:40 AM [name], MD [facility]  03/31/2021  9:20 AM [name], MD [facility]  03/31/2021 10:30 AM [name], PT [facility]  04/01/2021  8:00 AM [name], PTA [facility] None  04/14/2021  9:20 AM [name], MD [facility]  04/14/2021 10:30 AM [name], PT [facility]  04/17/2021  1:00 PM [name], PTA [facility] None  04/22/2021 10:40 AM [name], MD [facility]  10/01/2021  8:00 AM [name], NP [facility] None  03/18/2022  3:00 PM [name], NP [facility] None    HPI 65 y.o. AA female patient presents for complete physical.  She has Environmental  allergies; Chronic constipation; Lupus (systemic lupus erythematosus) ([place]); Hypertension; Abnormal glucose (prediabetes); Hyperlipidemia; Vitamin D deficiency; Morbid obesity ([place])- BMI 30+ with OSA; SUI (stress urinary incontinence, female); Medication management; RLS (restless legs syndrome); History of pulmonary embolus (PE);  History of renal cell carcinoma; H/O left nephrectomy; Depression; Anxiety; Rectocele; OSA on CPAP; Stage 3a chronic kidney disease (HCC); and Insulin resistance on their problem list.  She is single, 3 grown sons, 5 grandchildren, 1 great grandchild. Thinking about moving to [place] where son lives once she retires, but plans to work 2 more years, enjoys her job.   She has rectocele, stress incontinence, pelvic pain episodes, chronic constipation. Suspected element of pelvic dysfunction, was recently referred to a local uro/gyn Dr. [name] by [name] for her input and repeat pelvic floor PT. She has been on linzess, miralax for constipation. Doing injections, ? botox.   She has SLE and follows with [name], recently changed therapy from plaquenil to imuran and notes improvement with this med. 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   She has depression and anxiety, on lexapro/wellbutrin, was following with [name].  She is feeling that she is doing better with her anxiety. Recently tapered off of xanax and taking gabapentin with good results, had HA initially but seems to be resolving.   She had sleep study in 2020 showing OSA, on CPAP with restorative sleep.   BMI is Body mass index is 37.51 kg/m.,  Exercises limited by pelvic discomfort Formerly on phentermine/topamax, lack of benefit and stopped.  Reports 1 gallon water Yogurt with nuts and protein powder Salad with chicken or beans No fried foods Chicken with veggies for dinner  Admits not a lot of intentional exercises, pelvic/rectal sx worse with lifting.  Wt Readings from Last 3 Encounters:  03/18/21 201 lb 12.8 oz (91.5 kg)  03/10/21 201 lb (91.2 kg)  02/27/21 201 lb (91.2 kg)    Patient denies chest pain, shortness of breath, dizziness. BP: 120/78   Patient's cholesterol is diet controlled. The patient's cholesterol last visit was  Lab Results  Component Value Date   CHOL 194  10/29/2020   HDL 28 (L) 10/29/2020   LDLCALC 130 (H) 10/29/2020   TRIG 224 (H) 10/29/2020   CHOLHDL 6.9 (H) 10/29/2020   The patient has been working on diet and exercise for prediabetes, newly on ozempic tolerating 0.5 mg weekly f, denies changes in vision, polys, and paresthesias. Last A1C in office was Lab Results  Component Value Date   HGBA1C 6.0 (H) 12/18/2020   Vitamin D deficiency, on vitamin d Lab Results  Component Value Date   VD25OH 46 12/18/2020   She has had CKD III following kidney cancer; She underwent left laparoscopic nephrectomy by Dr. Link Snuffer on 09/19/2017 for left renal mass and carcinoma, follows annually.  Lab Results  Component Value Date   GFRNONAA 64 10/29/2020   GFRNONAA 51 (L) 05/27/2020   GFRNONAA 54 (L) 02/15/2020     Current Medications:   Current Outpatient Medications (Endocrine & Metabolic):    JJOACZYSAYT,0.16 or 0.5MG /DOS, (OZEMPIC, 0.25 OR 0.5 MG/DOSE,) 2 MG/1.5ML SOPN, Inject 0.5 mg into skin of stomach for sugar and insulin resistance.    Current Outpatient Medications (Analgesics):    acetaminophen (TYLENOL) 500 MG tablet, Take 1,000 mg by mouth every 6 (six) hours as needed for mild pain.   Current Outpatient Medications (Other):    ALPRAZolam (XANAX) 0.5 MG tablet, TAKE 1/2-1 TABLET 1-2 TIMES A DAY  ONLY IF NEEDED FOR PANIC/ANXIETY ATTACK. LIMIT TO 5 DAYS/WEEK TO AVOID ADDICTION & DEMENTIA   azaTHIOprine (IMURAN) 50 MG tablet, Take 50 mg by mouth 2 (two) times daily.   buPROPion (WELLBUTRIN XL) 150 MG 24 hr tablet, TAKE 1 TABLET EVERY MORNING FOR MOOD, FOCUS & CONCENTRATION   Cholecalciferol (VITAMIN D) 2000 UNITS CAPS, Take 1 capsule by mouth daily.   escitalopram (LEXAPRO) 20 MG tablet, TAKE 1.5 TABLETS (30 MG) DAILY FOR MOOD & CHRONIC ANXIETY   Magnesium 500 MG TABS, Take 500 mg by mouth daily.    gabapentin (NEURONTIN) 100 MG capsule, Take 2 cap at night for sleep and anxiety.  Health Maintenance:   Immunization History   Administered Date(s) Administered   Moderna SARS-COV2 Booster Vaccination 03/26/2020   Moderna Sars-Covid-2 Vaccination 07/26/2019, 08/28/2019   Pneumococcal Polysaccharide-23 01/07/2016   Td 05/19/2003   Tdap 05/23/2013   TDAP: 2015 Pneumovax: 2017 Prevnar DUE today  Flu vaccine: declines Shingrix: declines  Covid 19: 2/2, moderna, + booster  Pap: 2016 neg Boyd/Richardson, NP, recommended get by GYN this year MGM: 02/2020- category C- neg, schedule for this year DEXA: 2007 normal  Colonoscopy: Dr. Alice Reichert in [place] 10/2013, 10 years, now sees Dr. Silvano Rusk  EGD:N/A  Last eye: Dr. Lanell Persons, last 2022, glasses Last dental: 2022, goes q6m  Patient Care Team: [name], MD as PCP - General (Internal Medicine) Signe Colt, MD as Referring Physician (Obstetrics and Gynecology) Kristeen Miss, MD as Consulting Physician (Neurosurgery) Hennie Duos, MD as Consulting Physician (Rheumatology) Efrain Sella, MD as Referring Physician (Internal Medicine) Ara Kussmaul, MD as Consulting Physician (Ophthalmology) Bjorn Loser, MD as Consulting Physician (Urology)  Medical History:  Past Medical History:  Diagnosis Date   Anxiety    Chronic constipation    followed by dr Carlean Purl   CKD (chronic kidney disease), stage III ([place])    Enterocele    History of cervical cancer    hx squamous cell cervical carcinoma,   s/p  radical hysterectomy 11-24-1999   History of MRSA infection    2005--- buttock abscess   History of pulmonary embolus (PE)    post op 10-09-2017 (surgery on 09-19-2017)  lower range pulmonary artery,  resolved and completed 6 month Eliquis   History of renal cell carcinoma    s/p  left nephrectomy 09-19-2017  (oncologist -- dr Alen Blew / urologsit-- dr bell)   History of seizure    10/ 2001  one episode per discharge note in epic, 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 (pt had just been dx with lupus);   (05-21-2019 per pt none since)   Incisional hernia    MDD (major depressive disorder)    OA (osteoarthritis)    OSA on CPAP    followed by dr dohmeier   Pre-diabetes    Rectocele    RLS (restless legs syndrome) 01/24/2015   Per sleep study 05/2014    Solitary right kidney    acquired 09-19-2017 s/p left nephrectomy   SUI (stress urinary incontinence, female)    05-21-2019  improved with therapy   Systemic lupus erythematosus ([place])    rheumotologist--- dr Amil Amen (lov 03-19-2019 in epic)   Vitamin D deficiency    Wears glasses    Allergies Allergies  Allergen Reactions   Bactrim [Sulfamethoxazole-Trimethoprim]     GI upset   Cymbalta [Duloxetine Hcl]     Sweating   Acyclovir And Related Itching    SURGICAL HISTORY She  has  a past surgical history that includes Cesarean section (x3   last one 1980); Laparoscopic nephrectomy, hand assisted (Left, 09/19/2017); Colonoscopy; EXPLORATORY LAPAROTOMY MODIFIED RADICAL HYSTERECTOMY WITH PELVIC LYMPHADENECTOMY AND RESECTION MECKEL'S DIVERTICULUM (11-24-1999 dr Aldean Ast  @[facility] ); COLD KNIFE CERVICAL CONIZATION  (10/07/1999  @[facility] ); and Incisional hernia repair (N/A, 05/23/2019). FAMILY HISTORY Her family history includes Heart disease in her mother; Hyperlipidemia in her mother; Hypertension in her mother. SOCIAL HISTORY She  reports that she has never smoked. She has never used smokeless tobacco. She reports that she does not drink alcohol and does not use drugs.  Review of Systems  Constitutional: Negative.  Negative for chills and fever.  HENT: Negative.    Eyes: Negative.   Respiratory: Negative.    Cardiovascular: Negative.   Gastrointestinal:  Positive for constipation (better with linzess). Negative for abdominal pain, blood in stool, diarrhea, heartburn, melena, nausea and vomiting.  Genitourinary:  Positive for frequency and urgency.  Musculoskeletal:  Positive for back pain (bilateral lower back, hips, has improved). Negative for  falls, joint pain, myalgias and neck pain.  Skin: Negative.  Negative for rash.  Neurological:  Negative for dizziness, tingling, tremors and headaches.  Endo/Heme/Allergies: Negative.   Psychiatric/Behavioral:  Negative for depression and memory loss. The patient is not nervous/anxious and does not have insomnia.    Physical Exam: Estimated body mass index is 37.51 kg/m as calculated from the following:   Height as of this encounter: 5' 1.5" (1.562 m).   Weight as of this encounter: 201 lb 12.8 oz (91.5 kg). Vitals:   03/18/21 1508  BP: 120/78  Pulse: 86  Temp: (!) 97.5 F (36.4 C)  SpO2: 96%   General Appearance: Well nourished, in no apparent distress. Eyes: PERRLA, EOMs, conjunctiva no swelling or erythema Sinuses: No Frontal/maxillary tenderness ENT/Mouth: Ext aud canals clear, normal light reflex with TMs without erythema, bulging.  Good dentition. No erythema, swelling, or exudate on post pharynx. Tonsils not swollen or erythematous. Hearing normal. Crowded mouth.  Neck: Supple, thyroid normal. No bruits Respiratory: Respiratory effort normal, BS equal bilaterally without rales, rhonchi, wheezing or stridor. Cardio: RRR without murmurs, rubs or gallops. Brisk peripheral pulses without edema.  Chest: symmetric, with normal excursions and percussion. Breasts: breasts appear normal, no suspicious masses, no skin or nipple changes or axillary nodes. Abdomen: Soft, obese +BS. Non tender, no guarding, rebound, masses, or organomegaly. Very small ventral hernia along well healed vertical abdominal scar, with bearing down only, non-tender.  Lymphatics: Left supraclavicular fullness on the left with mobile nontender nodule. Non tender without lymphadenopathy.  Genitourinary: defer Musculoskeletal: Full ROM all peripheral extremities,5/5 strength, and normal gait. Skin: Warm, dry without rashes, lesions, ecchymosis.  Neuro: Cranial nerves intact, reflexes equal bilaterally. Normal  muscle tone, no cerebellar symptoms. Sensation intact to monofilament bilateral lower legs but decreased diffusely to bilateral knees.  Psych: Awake and oriented X 3, normal affect, Insight and Judgment appropriate.   EKG: WNL, no ST changes.    Izora Ribas, NP 5:10 PM [facility]

## 2021-03-18 ENCOUNTER — Ambulatory Visit (INDEPENDENT_AMBULATORY_CARE_PROVIDER_SITE_OTHER): Payer: BC Managed Care – PPO | Admitting: Adult Health

## 2021-03-18 ENCOUNTER — Encounter: Payer: Self-pay | Admitting: Adult Health

## 2021-03-18 VITALS — BP 120/78 | HR 86 | Temp 97.5°F | Ht 61.5 in | Wt 201.8 lb

## 2021-03-18 DIAGNOSIS — E559 Vitamin D deficiency, unspecified: Secondary | ICD-10-CM

## 2021-03-18 DIAGNOSIS — Z136 Encounter for screening for cardiovascular disorders: Secondary | ICD-10-CM

## 2021-03-18 DIAGNOSIS — Z131 Encounter for screening for diabetes mellitus: Secondary | ICD-10-CM

## 2021-03-18 DIAGNOSIS — Z9989 Dependence on other enabling machines and devices: Secondary | ICD-10-CM

## 2021-03-18 DIAGNOSIS — Z79899 Other long term (current) drug therapy: Secondary | ICD-10-CM

## 2021-03-18 DIAGNOSIS — I10 Essential (primary) hypertension: Secondary | ICD-10-CM

## 2021-03-18 DIAGNOSIS — N1831 Chronic kidney disease, stage 3a: Secondary | ICD-10-CM

## 2021-03-18 DIAGNOSIS — Z1329 Encounter for screening for other suspected endocrine disorder: Secondary | ICD-10-CM

## 2021-03-18 DIAGNOSIS — Z13220 Encounter for screening for lipoid disorders: Secondary | ICD-10-CM | POA: Diagnosis not present

## 2021-03-18 DIAGNOSIS — M329 Systemic lupus erythematosus, unspecified: Secondary | ICD-10-CM

## 2021-03-18 DIAGNOSIS — Z1389 Encounter for screening for other disorder: Secondary | ICD-10-CM | POA: Diagnosis not present

## 2021-03-18 DIAGNOSIS — Z0001 Encounter for general adult medical examination with abnormal findings: Secondary | ICD-10-CM

## 2021-03-18 DIAGNOSIS — Z9109 Other allergy status, other than to drugs and biological substances: Secondary | ICD-10-CM

## 2021-03-18 DIAGNOSIS — R7309 Other abnormal glucose: Secondary | ICD-10-CM

## 2021-03-18 DIAGNOSIS — F419 Anxiety disorder, unspecified: Secondary | ICD-10-CM

## 2021-03-18 DIAGNOSIS — E8881 Metabolic syndrome: Secondary | ICD-10-CM

## 2021-03-18 DIAGNOSIS — Z85528 Personal history of other malignant neoplasm of kidney: Secondary | ICD-10-CM

## 2021-03-18 DIAGNOSIS — Z0000 Encounter for general adult medical examination without abnormal findings: Secondary | ICD-10-CM | POA: Diagnosis not present

## 2021-03-18 DIAGNOSIS — F3341 Major depressive disorder, recurrent, in partial remission: Secondary | ICD-10-CM

## 2021-03-18 DIAGNOSIS — G4733 Obstructive sleep apnea (adult) (pediatric): Secondary | ICD-10-CM

## 2021-03-18 DIAGNOSIS — Z23 Encounter for immunization: Secondary | ICD-10-CM | POA: Diagnosis not present

## 2021-03-18 DIAGNOSIS — E785 Hyperlipidemia, unspecified: Secondary | ICD-10-CM

## 2021-03-18 MED ORDER — OZEMPIC (1 MG/DOSE) 4 MG/3ML ~~LOC~~ SOPN: 1.0000 mg | PEN_INJECTOR | SUBCUTANEOUS | 1 refills | Status: DC

## 2021-03-18 MED ORDER — GABAPENTIN 100 MG PO CAPS: ORAL_CAPSULE | ORAL | 0 refills | Status: DC

## 2021-03-18 NOTE — Patient Instructions (Addendum)
Beth [name] , Thank you for taking time to come for your Annual Wellness Visit. I appreciate your ongoing commitment to your health goals. Please review the following plan we discussed and let me know if I can assist you in the future.   These are the goals we discussed:  Goals      Blood Pressure < 130/80     DIET - EAT MORE FRUITS AND VEGETABLES     Aim for 5-7+ 1/2 cup servings of fresh fruits and veggies daily  Beans daily  Whole grains Nuts/seeds     Weight (lb) < 195 lb (88.5 kg)        This is a list of the screening recommended for you and due dates:  Health Maintenance  Topic Date Due   Zoster (Shingles) Vaccine (1 of 2) Never done   Pneumonia Vaccine (2 - PCV) 01/06/2017   Pap Smear  07/05/2019   COVID-19 Vaccine (3 - Moderna risk series) 04/23/2020   DEXA scan (bone density measurement)  06/01/2020   Mammogram  02/27/2022   Tetanus Vaccine  05/24/2023   Colon Cancer Screening  11/07/2023   Hepatitis C Screening: USPSTF Recommendation to screen - Ages 18-79 yo.  Completed   HIV Screening  Completed   HPV Vaccine  Aged Out   Flu Shot  Discontinued      Know what a healthy weight is for you (roughly BMI <25) and aim to maintain this  Aim for 7+ servings of fruits and vegetables daily  65-80+ fluid ounces of water or unsweet tea for healthy kidneys  Limit to max 1 drink of alcohol per day; avoid smoking/tobacco  Limit animal fats in diet for cholesterol and heart health - choose grass fed whenever available  Avoid highly processed foods, and foods high in saturated/trans fats  Aim for low stress - take time to unwind and care for your mental health  Aim for 150 min of moderate intensity exercise weekly for heart health, and weights twice weekly for bone health  Aim for 7-9 hours of sleep daily     High-Fiber Eating Plan Fiber, also called dietary fiber, is a type of carbohydrate. It is found foods such as fruits, vegetables, whole grains, and beans. A  high-fiber diet can have many health benefits. Your health care provider may recommend a high-fiber diet to help: Prevent constipation. Fiber can make your bowel movements more regular. Lower your cholesterol. Relieve the following conditions: Inflammation of veins in the anus (hemorrhoids). Inflammation of specific areas of the digestive tract (uncomplicated diverticulosis). A problem of the large intestine, also called the colon, that sometimes causes pain and diarrhea (irritable bowel syndrome, or IBS). Prevent overeating as part of a weight-loss plan. Prevent heart disease, type 2 diabetes, and certain cancers. What are tips for following this plan? Reading food labels  Check the nutrition facts label on food products for the amount of dietary fiber. Choose foods that have 5 grams of fiber or more per serving. The goals for recommended daily fiber intake include: Men (age 71 or younger): 34-38 g. Men (over age 66): 28-34 g. Women (age 87 or younger): 25-28 g. Women (over age 56): 22-25 g. Your daily fiber goal is _____________ g. Shopping Choose whole fruits and vegetables instead of processed forms, such as apple juice or applesauce. Choose a wide variety of high-fiber foods such as avocados, lentils, oats, and kidney beans. Read the nutrition facts label of the foods you choose. Be aware of foods  with added fiber. These foods often have high sugar and sodium amounts per serving. Cooking Use whole-grain flour for baking and cooking. Cook with brown rice instead of white rice. Meal planning Start the day with a breakfast that is high in fiber, such as a cereal that contains 5 g of fiber or more per serving. Eat breads and cereals that are made with whole-grain flour instead of refined flour or white flour. Eat brown rice, bulgur wheat, or millet instead of white rice. Use beans in place of meat in soups, salads, and pasta dishes. Be sure that half of the grains you eat each day are  whole grains. General information You can get the recommended daily intake of dietary fiber by: Eating a variety of fruits, vegetables, grains, nuts, and beans. Taking a fiber supplement if you are not able to take in enough fiber in your diet. It is better to get fiber through food than from a supplement. Gradually increase how much fiber you consume. If you increase your intake of dietary fiber too quickly, you may have bloating, cramping, or gas. Drink plenty of water to help you digest fiber. Choose high-fiber snacks, such as berries, raw vegetables, nuts, and popcorn. What foods should I eat? Fruits Berries. Pears. Apples. Oranges. Avocado. Prunes and raisins. Dried figs. Vegetables Sweet potatoes. Spinach. Kale. Artichokes. Cabbage. Broccoli. Cauliflower. Green peas. Carrots. Squash. Grains Whole-grain breads. Multigrain cereal. Oats and oatmeal. Brown rice. Barley. Bulgur wheat. [place]. Quinoa. Bran muffins. Popcorn. Rye wafer crackers. Meats and other proteins Navy beans, kidney beans, and pinto beans. Soybeans. Split peas. Lentils. Nuts and seeds. Dairy Fiber-fortified yogurt. Beverages Fiber-fortified soy milk. Fiber-fortified orange juice. Other foods Fiber bars. The items listed above may not be a complete list of recommended foods and beverages. Contact a dietitian for more information. What foods should I avoid? Fruits Fruit juice. Cooked, strained fruit. Vegetables Fried potatoes. Canned vegetables. Well-cooked vegetables. Grains White bread. Pasta made with refined flour. White rice. Meats and other proteins Fatty cuts of meat. Fried chicken or fried fish. Dairy Milk. Yogurt. Cream cheese. Sour cream. Fats and oils Butters. Beverages Soft drinks. Other foods Cakes and pastries. The items listed above may not be a complete list of foods and beverages to avoid. Talk with your dietitian about what choices are best for you. Summary Fiber is a type of  carbohydrate. It is found in foods such as fruits, vegetables, whole grains, and beans. A high-fiber diet has many benefits. It can help to prevent constipation, lower blood cholesterol, aid weight loss, and reduce your risk of heart disease, diabetes, and certain cancers. Increase your intake of fiber gradually. Increasing fiber too quickly may cause cramping, bloating, and gas. Drink plenty of water while you increase the amount of fiber you consume. The best sources of fiber include whole fruits and vegetables, whole grains, nuts, seeds, and beans. This information is not intended to replace advice given to you by your health care provider. Make sure you discuss any questions you have with your health care provider. Document Revised: 09/06/2019 Document Reviewed: 09/06/2019 Elsevier Patient Education  2022 [employer].

## 2021-03-19 ENCOUNTER — Encounter: Payer: BC Managed Care – PPO | Admitting: Physical Therapy

## 2021-03-19 LAB — MICROALBUMIN / CREATININE URINE RATIO
Creatinine, Urine: 103 mg/dL (ref 20–275)
Microalb Creat Ratio: 26 mcg/mg creat (ref <30)
Microalb, Ur: 2.7 mg/dL

## 2021-03-19 LAB — CBC WITH DIFFERENTIAL/PLATELET
Absolute Monocytes: 826 cells/uL (ref 200–950)
Basophils Absolute: 10 cells/uL (ref 0–200)
Basophils Relative: 0.2 %
Eosinophils Absolute: 31 cells/uL (ref 15–500)
Eosinophils Relative: 0.6 %
HCT: 41.3 % (ref 35.0–45.0)
Hemoglobin: 13.8 g/dL (ref 11.7–15.5)
Lymphs Abs: 1403 cells/uL (ref 850–3900)
MCH: 30.1 pg (ref 27.0–33.0)
MCHC: 33.4 g/dL (ref 32.0–36.0)
MCV: 90 fL (ref 80.0–100.0)
MPV: 8.9 fL (ref 7.5–12.5)
Monocytes Relative: 16.2 %
Neutro Abs: 2831 cells/uL (ref 1500–7800)
Neutrophils Relative %: 55.5 %
Platelets: 307 10*3/uL (ref 140–400)
RBC: 4.59 10*6/uL (ref 3.80–5.10)
RDW: 13 % (ref 11.0–15.0)
Total Lymphocyte: 27.5 %
WBC: 5.1 10*3/uL (ref 3.8–10.8)

## 2021-03-19 LAB — HEMOGLOBIN A1C
Hgb A1c MFr Bld: 6.1 % of total Hgb — ABNORMAL HIGH (ref <5.7)
Mean Plasma Glucose: 128 mg/dL
eAG (mmol/L): 7.1 mmol/L

## 2021-03-19 LAB — COMPLETE METABOLIC PANEL WITH GFR
AG Ratio: 1.1 (calc) (ref 1.0–2.5)
ALT: 15 U/L (ref 6–29)
AST: 16 U/L (ref 10–35)
Albumin: 4 g/dL (ref 3.6–5.1)
Alkaline phosphatase (APISO): 57 U/L (ref 37–153)
BUN: 23 mg/dL (ref 7–25)
CO2: 24 mmol/L (ref 20–32)
Calcium: 9.3 mg/dL (ref 8.6–10.4)
Chloride: 104 mmol/L (ref 98–110)
Creat: 1.05 mg/dL (ref 0.50–1.05)
Globulin: 3.5 g/dL (calc) (ref 1.9–3.7)
Glucose, Bld: 72 mg/dL (ref 65–99)
Potassium: 4.4 mmol/L (ref 3.5–5.3)
Sodium: 137 mmol/L (ref 135–146)
Total Bilirubin: 0.3 mg/dL (ref 0.2–1.2)
Total Protein: 7.5 g/dL (ref 6.1–8.1)
eGFR: 59 mL/min/{1.73_m2} — ABNORMAL LOW (ref >=60)

## 2021-03-19 LAB — LIPID PANEL
Cholesterol: 197 mg/dL (ref <200)
HDL: 43 mg/dL — ABNORMAL LOW (ref >=50)
LDL Cholesterol (Calc): 130 mg/dL (calc) — ABNORMAL HIGH
Non-HDL Cholesterol (Calc): 154 mg/dL (calc) — ABNORMAL HIGH (ref <130)
Total CHOL/HDL Ratio: 4.6 (calc) (ref <5.0)
Triglycerides: 128 mg/dL (ref <150)

## 2021-03-19 LAB — TSH: TSH: 1.31 mIU/L (ref 0.40–4.50)

## 2021-03-19 LAB — MICROSCOPIC MESSAGE

## 2021-03-19 LAB — URINALYSIS, ROUTINE W REFLEX MICROSCOPIC
Bilirubin Urine: NEGATIVE
Glucose, UA: NEGATIVE
Hgb urine dipstick: NEGATIVE
Hyaline Cast: NONE SEEN /LPF
Ketones, ur: NEGATIVE
Nitrite: NEGATIVE
Protein, ur: NEGATIVE
Specific Gravity, Urine: 1.02 (ref 1.001–1.035)
Yeast: NONE SEEN /HPF
pH: 6.5 (ref 5.0–8.0)

## 2021-03-19 LAB — MAGNESIUM: Magnesium: 1.9 mg/dL (ref 1.5–2.5)

## 2021-03-19 LAB — VITAMIN D 25 HYDROXY (VIT D DEFICIENCY, FRACTURES): Vit D, 25-Hydroxy: 72 ng/mL (ref 30–100)

## 2021-03-20 ENCOUNTER — Other Ambulatory Visit (HOSPITAL_COMMUNITY): Payer: Self-pay | Admitting: Urology

## 2021-03-20 ENCOUNTER — Other Ambulatory Visit: Payer: Self-pay

## 2021-03-20 ENCOUNTER — Ambulatory Visit (HOSPITAL_COMMUNITY)
Admission: RE | Admit: 2021-03-20 | Discharge: 2021-03-20 | Disposition: A | Discharge status: Home / Self Care | Payer: BC Managed Care – PPO | Source: Ambulatory Visit | Attending: Urology | Admitting: Urology

## 2021-03-20 DIAGNOSIS — C642 Malignant neoplasm of left kidney, except renal pelvis: Secondary | ICD-10-CM

## 2021-03-23 NOTE — Progress Notes (Signed)
Ms. Beth [name] is a 65 y.o. female who presents for levator trigger point injection, Series #3  She feels the pain has reduced, has been feeling well.   Vitals:   03/25/21 1058  BP: (!) 156/82  Pulse: 76     Indication(s): Levator spasm.   Informed Consent:  The alternatives, risks and benefits of the procedure were explained to the patient. Risks including, but not limited to discomfort, pain, bleeding, infection, injury to nearby structures, inability to perform the procedure, failure of the procedure were discussed.  All questions were answered and the patient elected to proceed.  Procedure:   The patient was positioned in dorsal lithotomy position.  The vaginal tissues were prepped with Hibiclens solution.  An injection of 10cc of a mixture of 90% anesthetic (0.25% Bupivacaine) and 10% 40mg /ml Triamcinolone acetomide (Kenalog) was performed in multiple in the bilateral levator muscles, a total of 5 injection sites.  Pressure was held over bleeding areas until good hemostasis was achieved.   The patient tolerated the procedure well with no apparent complications.  [name], MD

## 2021-03-24 ENCOUNTER — Encounter: Payer: BC Managed Care – PPO | Admitting: Physical Therapy

## 2021-03-24 ENCOUNTER — Encounter: Payer: Self-pay | Admitting: Physical Therapy

## 2021-03-24 ENCOUNTER — Other Ambulatory Visit: Payer: Self-pay

## 2021-03-24 DIAGNOSIS — M6281 Muscle weakness (generalized): Secondary | ICD-10-CM

## 2021-03-24 DIAGNOSIS — R252 Cramp and spasm: Secondary | ICD-10-CM

## 2021-03-24 DIAGNOSIS — R32 Unspecified urinary incontinence: Secondary | ICD-10-CM

## 2021-03-24 DIAGNOSIS — K6289 Other specified diseases of anus and rectum: Secondary | ICD-10-CM

## 2021-03-24 NOTE — Therapy (Signed)
[place] at [facility] [address], [place], [place], [address] Phone: [phone]   Fax:  [phone]  Physical Therapy Treatment  Patient Details  Name: Mykell Genao MRN: [id number] Date of Birth: 1955/09/10 Referring Provider (PT): Dr. [name]   Encounter Date: 03/24/2021   PT End of Session - 03/24/21 1207     Visit Number 7    Date for PT Re-Evaluation 04/17/21    Authorization Type BCBS    PT Start Time 1130    PT Stop Time 1215    PT Time Calculation (min) 45 min    Activity Tolerance Patient tolerated treatment well;No increased pain    Behavior During Therapy WFL for tasks assessed/performed             Past Medical History:  Diagnosis Date   Anxiety    Chronic constipation    followed by dr Carlean Purl   CKD (chronic kidney disease), stage III ([place])    Enterocele    History of cervical cancer    hx squamous cell cervical carcinoma,   s/p  radical hysterectomy 11-24-1999   History of MRSA infection    2005--- buttock abscess   History of pulmonary embolus (PE)    post op 10-09-2017 (surgery on 09-19-2017)  lower range pulmonary artery,  resolved and completed 6 month Eliquis   History of renal cell carcinoma    s/p  left nephrectomy 09-19-2017  (oncologist -- dr Alen Blew / urologsit-- dr bell)   History of seizure    10/ 2001  one episode per discharge note in epic, 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 (pt had just been dx with lupus);  (05-21-2019 per pt none since)   Incisional hernia    MDD (major depressive disorder)    OA (osteoarthritis)    OSA on CPAP    followed by dr dohmeier   Pre-diabetes    Rectocele    RLS (restless legs syndrome) 01/24/2015   Per sleep study 05/2014    Solitary right kidney    acquired 09-19-2017 s/p left nephrectomy   SUI (stress urinary incontinence, female)    05-21-2019  improved with therapy   Systemic lupus erythematosus  ([place])    rheumotologist--- dr Amil Amen (lov 03-19-2019 in epic)   Vitamin D deficiency    Wears glasses     Past Surgical History:  Procedure Laterality Date   CESAREAN SECTION  x3   last one 1980   COLD KNIFE CERVICAL CONIZATION   10/07/1999  @[facility]    COLONOSCOPY     negative per pt, Dr. Alice Reichert - [facility]   EXPLORATORY LAPAROTOMY MODIFIED RADICAL HYSTERECTOMY WITH PELVIC LYMPHADENECTOMY AND RESECTION MECKEL'S DIVERTICULUM  11-24-1999 dr Aldean Ast  @[facility]    [place] N/A 05/23/2019   Procedure: LAPAROSCOPIC [place];  Surgeon: Kinsinger, Arta Bruce, MD;  Location: [facility];  Service: General;  Laterality: N/A;   LAPAROSCOPIC NEPHRECTOMY, HAND ASSISTED Left 09/19/2017   Procedure: LEFT HAND ASSISTED LAPAROSCOPIC NEPHRECTOMY;  Surgeon: Lucas Mallow, MD;  Location: [facility];  Service: Urology;  Laterality: Left;    There were no vitals filed for this visit.   Subjective Assessment - 03/24/21 1138     Subjective I am feeling better. Patient feels she is walking better. The heaviness in the pelvis is 40% better. Rectum is not as bad. I am not going to the pool due to being too much. When th estool gets tto  the entrance there is increased rectal pain and increased pain in legs. MOre heaviness on the left side of the vagina.    Patient Stated Goals reduce pain, improve balance, return to exercise    Currently in Pain? Yes    Pain Score 5     Pain Location Rectum    Pain Orientation Mid    Pain Descriptors / Indicators Sore    Pain Type Chronic pain    Pain Onset More than a month ago    Pain Frequency Constant    Aggravating Factors  when bowel in the rectum; sitting, walking    Pain Relieving Factors laying down to rest    Multiple Pain Sites Yes    Pain Score 5    Pain Location Vagina    Pain Orientation Mid    Pain Descriptors / Indicators Heaviness    Pain Type Chronic pain    Pain Onset More than a month ago    Pain Frequency  Constant    Aggravating Factors  walking and exercise    Pain Relieving Factors sit and rest                [facility] PT Assessment - 03/24/21 0001       PROM   Right Hip Extension 10    Right Hip External Rotation  65    Left Hip Extension 10    Left Hip External Rotation  65                        Pelvic Floor Special Questions - 03/24/21 0001     Pelvic Floor Internal Exam Patient confirms identification and approves PT to assess and treat the pelvic floor    Exam Type Vaginal    Strength weak squeeze, no lift               [facility] Adult PT Treatment/Exercise - 03/24/21 0001       Lumbar Exercises: Stretches   Active Hamstring Stretch Right;Left;1 rep;30 seconds    Active Hamstring Stretch Limitations sitting    Piriformis Stretch Right;Left;1 rep;30 seconds    Piriformis Stretch Limitations sitting      Knee/Hip Exercises: Standing   Hip Abduction Stengthening;Right;Left;2 sets;10 reps    Abduction Limitations foot on towel sliding outward      Manual Therapy   Manual Therapy Internal Pelvic Floor    Internal Pelvic Floor manual work to bilateral levator ani and obturator internist and ischial spine, and along the rectum vaginally                     PT Education - 03/24/21 1223     Education Details Access Code: [email or web address]    Person(s) Educated Patient    Methods Explanation;Demonstration;Verbal cues;Handout    Comprehension Returned demonstration;Verbalized understanding              PT Short Term Goals - 03/17/21 1235       PT SHORT TERM GOAL #2   Title understand correct toileting techniques to reduce straining with bowel movements and empty the bowel completely    Time 4    Period Weeks    Status Achieved      PT SHORT TERM GOAL #3   Title able to fully empty her bladder due to the ability to relax her pelvic floor muscles and understands techniques to assist in emptying    Time 4    Period Weeks  Status  Achieved               PT Long Term Goals - 03/24/21 1144       PT LONG TERM GOAL #1   Title independent with advanced HEP for improve strength with a gentle exercise routine    Time 12    Period Weeks    Status On-going      PT LONG TERM GOAL #2   Title understand ways to manage her pain with breathing, body scanning, and medtiation    Baseline patient is using them and it helps    Time 12    Period Weeks    Status Achieved      PT LONG TERM GOAL #3   Title able to have a complete bowel movement every 2-3 days with pain decreased </= 1-2/10    Baseline bowel movement daily that is a hard stool and pain level 5/10    Time 12    Period Weeks    Status On-going      PT LONG TERM GOAL #4   Title urinary leakage with activity decreased >/= 50% due to the ability to fully relax the pelvic floor and contract >/= 3/5 strength    Baseline urinary leakage is better and 40% better, still wears a pad, gets up 2 times per night    Time 12    Period Weeks    Status On-going                   Plan - 03/24/21 1140     Clinical Impression Statement Patient was able to walk with a faster pace after therapy. Her pain level decreased to 3/10 from 5/10 after manual therapy. Patient has increased hip extension to 10 degre and bilateral hip external rotation increased to 65 degrees. Patient reports her urianry leakage has decreased by 40%. She only gets up in the middle of the night 2 times compared to 4 times. Patinet is having a bowel movement daily but they are hard and pin is 5/10. She is using the body scanning and meditation to assist with pain management. Patient will benefit from skilled therapy to reduce her muscle spasms to reduce rectal pain and vaginal heaviness.    Personal Factors and Comorbidities Age;Comorbidity 3+;Fitness;Past/Current Experience;Time since onset of injury/illness/exacerbation    Comorbidities c-section x3; Hx of cervical cancer 11/24/1999; Hx of  pulmonary emolus 10/09/2017; Hx o f renal cell cancer 09/19/2017; Lupus; insicional hernia repair 05/23/2019; radiacl lymphodenectomy 11/24/1999; cold knife cervical conizaaltion 10/07/1999    Examination-Activity Limitations Bed Mobility;Sit;Bend;Squat;Carry;Continence;Toileting;Lift;Locomotion Level;Transfers    Examination-Participation Restrictions Cleaning;Community Activity    Stability/Clinical Decision Making Evolving/Moderate complexity    Rehab Potential Good    PT Frequency 2x / week    PT Duration 12 weeks    PT Treatment/Interventions ADLs/Self Care Home Management;Aquatic Therapy;Electrical Stimulation;Therapeutic activities;Therapeutic exercise;Balance training;Neuromuscular re-education;Patient/family education;Manual techniques;Manual lymph drainage;Scar mobilization;Passive range of motion;Dry needling;Spinal Manipulations;Joint Manipulations    PT Next Visit Plan internal work vaginally; work on modified lunge for SI stabiltiy    PT Home Exercise Plan Access Code: [email or web address]    Consulted and Agree with Plan of Care Patient             Patient will benefit from skilled therapeutic intervention in order to improve the following deficits and impairments:  Decreased endurance, Decreased scar mobility, Decreased activity tolerance, Decreased strength, Increased fascial restricitons, Pain, Decreased balance, Decreased mobility, Increased muscle spasms, Difficulty walking, Decreased coordination, Decreased range  of motion  Visit Diagnosis: Cramp and spasm  Muscle weakness (generalized)  Urinary incontinence, unspecified type  Rectal pain     Problem List Patient Active Problem List   Diagnosis Date Noted   Insulin resistance 12/09/2020   Stage 3a chronic kidney disease ([place]) 10/29/2020   OSA on CPAP 01/16/2019   Rectocele 12/04/2017   History of pulmonary embolus (PE) 10/09/2017   History of renal cell carcinoma 10/09/2017   H/O left nephrectomy 10/09/2017   Depression  10/09/2017   Anxiety 10/09/2017   RLS (restless legs syndrome) 01/24/2015   Morbid obesity ([place])- BMI 30+ with OSA 05/28/2014   SUI (stress urinary incontinence, female) 05/28/2014   Medication management 05/28/2014   Hypertension    Abnormal glucose (prediabetes)    Hyperlipidemia    Vitamin D deficiency    Environmental allergies 07/14/2006   Chronic constipation 07/14/2006   Lupus (systemic lupus erythematosus) ([place]) 07/14/2006    Earlie Counts, PT 03/24/21 12:31 PM  [facility] at [facility] [address], [place], [place], [address] Phone: [phone]   Fax:  [phone]  Name: Kianna Billet MRN: [id number] Date of Birth: Mar 11, 1956

## 2021-03-24 NOTE — Patient Instructions (Signed)
Access Code: [email or web address] URL: [email or web address] Date: 03/24/2021 Prepared by: Earlie Counts  Exercises Supine Hamstring Stretch - 1 x daily - 7 x weekly - 1 sets - 2 reps - 30 sec hold Supine Single Knee to Chest Stretch - 1 x daily - 7 x weekly - 1 sets - 2 reps - 30 sec hold Supine Piriformis Stretch with Leg Straight - 1 x daily - 7 x weekly - 1 sets - 2 reps - 30 sec hold Gastroc Stretch on Wall - 1 x daily - 7 x weekly - 1 sets - 2 reps - 30 sec hold Standing Thoracic Spine Stretch - 1 x daily - 7 x weekly - 1 sets - 2 reps - 30 sec hold Standing Hip Abduction with Unilateral Counter Support - 1 x daily - 3 x weekly - 3 sets - 10 reps Sit to Stand Without Arm Support - 1 x daily - 3 x weekly - 1 sets - 10 reps Seated Hamstring Stretch - 1 x daily - 7 x weekly - 1 sets - 2 reps - 30 sec hold Seated Piriformis Stretch with Trunk Bend - 1 x daily - 7 x weekly - 1 sets - 2 reps - 30 sec hold  Earlie Counts, PT [facility] [place] [address], [place], [place] [address] W: [phone] [email or web address]

## 2021-03-25 ENCOUNTER — Encounter: Payer: Self-pay | Admitting: Obstetrics and Gynecology

## 2021-03-25 ENCOUNTER — Ambulatory Visit: Payer: BC Managed Care – PPO | Admitting: Physical Therapy

## 2021-03-25 ENCOUNTER — Ambulatory Visit (INDEPENDENT_AMBULATORY_CARE_PROVIDER_SITE_OTHER): Payer: BC Managed Care – PPO | Admitting: Obstetrics and Gynecology

## 2021-03-25 VITALS — BP 156/82 | HR 76 | Wt 201.0 lb

## 2021-03-25 DIAGNOSIS — M62838 Other muscle spasm: Secondary | ICD-10-CM | POA: Diagnosis not present

## 2021-03-25 MED ORDER — TRIAMCINOLONE ACETONIDE 40 MG/ML IJ SUSP
40.0000 mg | Freq: Once | INTRAMUSCULAR | Status: AC
Start: 2021-03-25 — End: 2021-03-25
  Administered 2021-03-25: 40 mg via INTRAMUSCULAR

## 2021-03-25 MED ORDER — BUPIVACAINE HCL 0.25 % IJ SOLN
9.0000 mL | Freq: Once | INTRAMUSCULAR | Status: AC
Start: 2021-03-25 — End: 2021-03-25
  Administered 2021-03-25: 9 mL

## 2021-03-26 ENCOUNTER — Encounter: Payer: BC Managed Care – PPO | Admitting: Physical Therapy

## 2021-03-29 ENCOUNTER — Other Ambulatory Visit: Payer: Self-pay | Admitting: Adult Health

## 2021-03-31 ENCOUNTER — Ambulatory Visit (INDEPENDENT_AMBULATORY_CARE_PROVIDER_SITE_OTHER): Payer: BC Managed Care – PPO | Admitting: Obstetrics and Gynecology

## 2021-03-31 ENCOUNTER — Encounter: Payer: Self-pay | Admitting: Obstetrics and Gynecology

## 2021-03-31 ENCOUNTER — Other Ambulatory Visit: Payer: Self-pay

## 2021-03-31 ENCOUNTER — Encounter: Payer: Self-pay | Admitting: Physical Therapy

## 2021-03-31 ENCOUNTER — Encounter: Payer: BC Managed Care – PPO | Admitting: Physical Therapy

## 2021-03-31 VITALS — BP 149/84 | HR 81

## 2021-03-31 DIAGNOSIS — M6281 Muscle weakness (generalized): Secondary | ICD-10-CM

## 2021-03-31 DIAGNOSIS — M62838 Other muscle spasm: Secondary | ICD-10-CM | POA: Diagnosis not present

## 2021-03-31 DIAGNOSIS — R32 Unspecified urinary incontinence: Secondary | ICD-10-CM

## 2021-03-31 DIAGNOSIS — R252 Cramp and spasm: Secondary | ICD-10-CM

## 2021-03-31 DIAGNOSIS — K6289 Other specified diseases of anus and rectum: Secondary | ICD-10-CM

## 2021-03-31 MED ORDER — TRIAMCINOLONE ACETONIDE 40 MG/ML IJ SUSP
40.0000 mg | Freq: Once | INTRAMUSCULAR | Status: AC
  Administered 2021-03-31: 40 mg via INTRAMUSCULAR

## 2021-03-31 MED ORDER — BUPIVACAINE HCL 0.25 % IJ SOLN
9.0000 mL | Freq: Once | INTRAMUSCULAR | Status: AC
  Administered 2021-03-31: 9 mL

## 2021-03-31 NOTE — Patient Instructions (Signed)
Access Code: [email or web address] URL: [email or web address] Date: 03/31/2021 Prepared by: Earlie Counts  Exercises Lunge with Counter Support - 1 x daily - 5 x weekly - 1 sets - 5 reps Earlie Counts, PT [facility] [address], [place], [place] [address] W: [phone] [email or web address]

## 2021-03-31 NOTE — Therapy (Signed)
[place] at [facility] [address] [place], [place], [address] Phone: [phone]   Fax:  [phone]  Physical Therapy Treatment  Patient Details  Name: Beth [name] MRN: [id number] Date of Birth: 10/05/1955 Referring Provider (PT): Dr. [name]   Encounter Date: 03/31/2021   PT End of Session - 03/31/21 1046     Visit Number 8    Date for PT Re-Evaluation 04/17/21    Authorization Type BCBS    PT Start Time 1030    PT Stop Time 1120    PT Time Calculation (min) 50 min    Activity Tolerance Patient tolerated treatment well;No increased pain    Behavior During Therapy WFL for tasks assessed/performed             Past Medical History:  Diagnosis Date   Anxiety    Chronic constipation    followed by dr Carlean Purl   CKD (chronic kidney disease), stage III ([place])    Enterocele    History of cervical cancer    hx squamous cell cervical carcinoma,   s/p  radical hysterectomy 11-24-1999   History of MRSA infection    2005--- buttock abscess   History of pulmonary embolus (PE)    post op 10-09-2017 (surgery on 09-19-2017)  lower range pulmonary artery,  resolved and completed 6 month Eliquis   History of renal cell carcinoma    s/p  left nephrectomy 09-19-2017  (oncologist -- dr Alen Blew / urologsit-- dr bell)   History of seizure    10/ 2001  one episode per discharge note in epic, 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 (pt had just been dx with lupus);  (05-21-2019 per pt none since)   Incisional hernia    MDD (major depressive disorder)    OA (osteoarthritis)    OSA on CPAP    followed by dr dohmeier   Pre-diabetes    Rectocele    RLS (restless legs syndrome) 01/24/2015   Per sleep study 05/2014    Solitary right kidney    acquired 09-19-2017 s/p left nephrectomy   SUI (stress urinary incontinence, female)    05-21-2019  improved with therapy   Systemic lupus erythematosus  ([place])    rheumotologist--- dr Amil Amen (lov 03-19-2019 in epic)   Vitamin D deficiency    Wears glasses     Past Surgical History:  Procedure Laterality Date   CESAREAN SECTION  x3   last one 1980   COLD KNIFE CERVICAL CONIZATION   10/07/1999  @[facility]    COLONOSCOPY     negative per pt, Dr. Alice Reichert - [facility]   EXPLORATORY LAPAROTOMY MODIFIED RADICAL HYSTERECTOMY WITH PELVIC LYMPHADENECTOMY AND RESECTION MECKEL'S DIVERTICULUM  11-24-1999 dr Aldean Ast  @[facility]    INCISIONAL HERNIA REPAIR N/A 05/23/2019   Procedure: LAPAROSCOPIC INCISIONAL HERNIA REPAIR WITH MESH;  Surgeon: Kinsinger, Arta Bruce, MD;  Location: [facility];  Service: General;  Laterality: N/A;   LAPAROSCOPIC NEPHRECTOMY, HAND ASSISTED Left 09/19/2017   Procedure: LEFT HAND ASSISTED LAPAROSCOPIC NEPHRECTOMY;  Surgeon: Lucas Mallow, MD;  Location: [facility];  Service: Urology;  Laterality: Left;    There were no vitals filed for this visit.   Subjective Assessment - 03/31/21 1035     Subjective The injections feel [name] by the end of the day. I feel a difference.    Patient Stated Goals reduce pain, improve balance, return to exercise  Pelvic Floor Special Questions - 03/31/21 0001     Pad use pad fo rjust in case    Urinary frequency 2 times    Pelvic Floor Internal Exam Patient confirms identification and approves PT to assess and treat the pelvic floor    Exam Type Vaginal               [facility] Adult PT Treatment/Exercise - 03/31/21 0001       Knee/Hip Exercises: Standing   Other Standing Knee Exercises standing lunge 10x each side with verbal cues to not let knee past the toes and one hand on the counter      Manual Therapy   Manual Therapy Internal Pelvic Floor    Internal Pelvic Floor manual work to the levatoa ani and obturator internist wiht hip movement to lengthen the tissue and reduce the trigger points                     PT Education -  03/31/21 1120     Education Details Access Code: [email or web address]    Person(s) Educated Patient    Methods Explanation;Demonstration;Verbal cues;Handout    Comprehension Returned demonstration;Verbalized understanding              PT Short Term Goals - 03/31/21 1040       PT SHORT TERM GOAL #1   Title indpendent with hip stretches; diaphragmatic breathing; and scar massage    Time 4    Period Weeks    Status Achieved    Target Date 01/13/21      PT SHORT TERM GOAL #2   Title understand correct toileting techniques to reduce straining with bowel movements and empty the bowel completely    Time 4    Period Weeks    Status Achieved      PT SHORT TERM GOAL #3   Title able to fully empty her bladder due to the ability to relax her pelvic floor muscles and understands techniques to assist in emptying    Time 4    Period Weeks    Status Achieved    Target Date 01/13/21               PT Long Term Goals - 03/31/21 1040       PT LONG TERM GOAL #1   Title independent with advanced HEP for improve strength with a gentle exercise routine    Time 12    Period Weeks    Status On-going      PT LONG TERM GOAL #2   Title understand ways to manage her pain with breathing, body scanning, and medtiation    Time 12    Period Weeks    Status Achieved      PT LONG TERM GOAL #3   Title able to have a complete bowel movement every 2-3 days with pain decreased </= 1-2/10    Baseline BM 1x per day; pain level is 4/10; Type 4    Time 12    Period Weeks    Status On-going      PT LONG TERM GOAL #4   Title urinary leakage with activity decreased >/= 50% due to the ability to fully relax the pelvic floor and contract >/= 3/5 strength    Baseline urinary leakage is [name] and 50% [name], still wears a pad just in case, gets up 2 times per night    Time 12    Period Weeks    Status On-going  Plan - 03/31/21 1036     Clinical Impression Statement Patient  reports she has a stronger stream of urine. Patient reports her urinary leakage is 50% [name] and wears a pad for just in case. Patient is having type 4 bowel movements daily and uses the Smooth move tea. Patient rectal pain is 50% [name]. Patient reports her pelvic floor heaviness is improved by 50%. Pelvic floor strength is 3/5 but slow to release after contraction. Good elongation of pelvic floor muscles with manual work.  Patient will benefit from skilled therapy to reduce her muscle spasms to reduce rectal and vaginal heaviness.    Personal Factors and Comorbidities Age;Comorbidity 3+;Fitness;Past/Current Experience;Time since onset of injury/illness/exacerbation    Comorbidities c-section x3; Hx of cervical cancer 11/24/1999; Hx of pulmonary emolus 10/09/2017; Hx o f renal cell cancer 09/19/2017; Lupus; insicional hernia repair 05/23/2019; radiacl lymphodenectomy 11/24/1999; cold knife cervical conizaaltion 10/07/1999    Examination-Activity Limitations Bed Mobility;Sit;Bend;Squat;Carry;Continence;Toileting;Lift;Locomotion Level;Transfers    Examination-Participation Restrictions Cleaning;Community Activity    Stability/Clinical Decision Making Evolving/Moderate complexity    Rehab Potential Good    PT Frequency 2x / week    PT Duration 12 weeks    PT Treatment/Interventions ADLs/Self Care Home Management;Aquatic Therapy;Electrical Stimulation;Therapeutic activities;Therapeutic exercise;Balance training;Neuromuscular re-education;Patient/family education;Manual techniques;Manual lymph drainage;Scar mobilization;Passive range of motion;Dry needling;Spinal Manipulations;Joint Manipulations    PT Next Visit Plan internal work vaginally; balance exercise; educate on vaginal wand so she can do her own tissue work    PT Home Exercise Plan Access Code: [email or web address]    Consulted and Agree with Plan of Care Patient             Patient will benefit from skilled therapeutic intervention in order to improve the  following deficits and impairments:  Decreased endurance, Decreased scar mobility, Decreased activity tolerance, Decreased strength, Increased fascial restricitons, Pain, Decreased balance, Decreased mobility, Increased muscle spasms, Difficulty walking, Decreased coordination, Decreased range of motion  Visit Diagnosis: Cramp and spasm  Muscle weakness (generalized)  Urinary incontinence, unspecified type  Rectal pain     Problem List Patient Active Problem List   Diagnosis Date Noted   Insulin resistance 12/09/2020   Stage 3a chronic kidney disease ([place]) 10/29/2020   OSA on CPAP 01/16/2019   Rectocele 12/04/2017   History of pulmonary embolus (PE) 10/09/2017   History of renal cell carcinoma 10/09/2017   H/O left nephrectomy 10/09/2017   Depression 10/09/2017   Anxiety 10/09/2017   RLS (restless legs syndrome) 01/24/2015   Morbid obesity ([place])- BMI 30+ with OSA 05/28/2014   SUI (stress urinary incontinence, female) 05/28/2014   Medication management 05/28/2014   Hypertension    Abnormal glucose (prediabetes)    Hyperlipidemia    Vitamin D deficiency    Environmental allergies 07/14/2006   Chronic constipation 07/14/2006   Lupus (systemic lupus erythematosus) ([place]) 07/14/2006    Earlie Counts, PT 03/31/21 11:22 AM  [facility] [place] [address] [place], [place], [address] Phone: [phone]   Fax:  [phone]  Name: Beth [name] MRN: [id number] Date of Birth: 01-Jun-1955

## 2021-03-31 NOTE — Progress Notes (Signed)
Ms. Yanin Muhlestein is a 65 y.o. female who presents for levator trigger point injection, Series #4  Feels she has the most benefit from pain reduction with same day of injection, then wears off some.   Vitals:   03/31/21 0920  BP: (!) 149/84  Pulse: 81     Indication(s): Levator spasm.   Informed Consent:  The alternatives, risks and benefits of the procedure were explained to the patient. Risks including, but not limited to discomfort, pain, bleeding, infection, injury to nearby structures, inability to perform the procedure, failure of the procedure were discussed.  All questions were answered and the patient elected to proceed.  Procedure:   The patient was positioned in dorsal lithotomy position.  The vaginal tissues were prepped with Hibiclens solution.  An injection of 10cc of a mixture of 90% anesthetic (0.25% Bupivacaine) and 10% 40mg /ml Triamcinolone acetomide (Kenalog) was performed in multiple in the bilateral levator muscles, a total of 5 injection sites.  Pressure was held over bleeding areas until good hemostasis was achieved.   The patient tolerated the procedure well with no apparent complications.  Return 1 week for repeat injection  [name], MD

## 2021-04-01 ENCOUNTER — Ambulatory Visit: Payer: Self-pay | Admitting: Physical Therapy

## 2021-04-02 ENCOUNTER — Encounter: Payer: BC Managed Care – PPO | Admitting: Physical Therapy

## 2021-04-13 NOTE — Progress Notes (Signed)
Ms. Beth [name] is a 65 y.o. female who presents for levator trigger point injection, Series #5  Feels she has the most benefit from pain reduction with same day of injection. Feels she has improvement from PT.   Vitals:   04/14/21 0920  BP: (!) 150/83  Pulse: 73     Indication(s): Levator spasm.   Informed Consent:  The alternatives, risks and benefits of the procedure were explained to the patient. Risks including, but not limited to discomfort, pain, bleeding, infection, injury to nearby structures, inability to perform the procedure, failure of the procedure were discussed.  All questions were answered and the patient elected to proceed.  Procedure:   The patient was positioned in dorsal lithotomy position.  The vaginal tissues were prepped with Hibiclens solution.  An injection of 10cc of a mixture of 90% anesthetic (0.25% Bupivacaine) and 10% 40mg /ml Triamcinolone acetomide (Kenalog) was performed in multiple in the bilateral levator muscles, a total of 6 injection sites.  Pressure was held over bleeding areas until good hemostasis was achieved.   The patient tolerated the procedure well with no apparent complications.  Return 1 week for repeat injection  [name], MD

## 2021-04-14 ENCOUNTER — Encounter: Payer: BC Managed Care – PPO | Admitting: Physical Therapy

## 2021-04-14 ENCOUNTER — Encounter: Payer: Self-pay | Admitting: Physical Therapy

## 2021-04-14 ENCOUNTER — Other Ambulatory Visit: Payer: Self-pay

## 2021-04-14 ENCOUNTER — Ambulatory Visit (INDEPENDENT_AMBULATORY_CARE_PROVIDER_SITE_OTHER): Payer: BC Managed Care – PPO | Admitting: Obstetrics and Gynecology

## 2021-04-14 ENCOUNTER — Encounter: Payer: Self-pay | Admitting: Obstetrics and Gynecology

## 2021-04-14 VITALS — BP 150/83 | HR 73 | Wt 193.0 lb

## 2021-04-14 DIAGNOSIS — M6281 Muscle weakness (generalized): Secondary | ICD-10-CM | POA: Diagnosis not present

## 2021-04-14 DIAGNOSIS — M62838 Other muscle spasm: Secondary | ICD-10-CM | POA: Diagnosis not present

## 2021-04-14 DIAGNOSIS — R32 Unspecified urinary incontinence: Secondary | ICD-10-CM

## 2021-04-14 DIAGNOSIS — R252 Cramp and spasm: Secondary | ICD-10-CM

## 2021-04-14 NOTE — Therapy (Addendum)
[place] at [facility] [address], [place], [place], [address] Phone: [phone]   Fax:  [phone]  Physical Therapy Treatment  Patient Details  Name: Beth [name] MRN: [id number] Date of Birth: [birth year] Referring Provider (PT): Dr. [name]   Encounter Date: 04/14/2021   PT End of Session - 04/14/21 1106     Visit Number 9    Date for PT Re-Evaluation 07/10/21    Authorization Type BCBS    PT Start Time 0950    PT Stop Time 1100    PT Time Calculation (min) 70 min    Activity Tolerance Patient tolerated treatment well;No increased pain    Behavior During Therapy WFL for tasks assessed/performed             Past Medical History:  Diagnosis Date   Anxiety    Chronic constipation    followed by dr Carlean Purl   CKD (chronic kidney disease), stage III ([place])    Enterocele    History of cervical cancer    hx squamous cell cervical carcinoma,   s/p  radical hysterectomy 11-24-1999   History of MRSA infection    2005--- buttock abscess   History of pulmonary embolus (PE)    post op 10-09-2017 (surgery on 09-19-2017)  lower range pulmonary artery,  resolved and completed 6 month Eliquis   History of renal cell carcinoma    s/p  left nephrectomy 09-19-2017  (oncologist -- dr Alen Blew / urologsit-- dr bell)   History of seizure    10/ 2001  one episode per discharge note in epic, 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 (pt had just been dx with lupus);  (05-21-2019 per pt none since)   Incisional hernia    MDD (major depressive disorder)    OA (osteoarthritis)    OSA on CPAP    followed by dr dohmeier   Pre-diabetes    Rectocele    RLS (restless legs syndrome) 01/24/2015   Per sleep study 05/2014    Solitary right kidney    acquired 09-19-2017 s/p left nephrectomy   SUI (stress urinary incontinence, female)    05-21-2019  improved with therapy   Systemic lupus erythematosus  ([place])    rheumotologist--- dr Amil Amen (lov 03-19-2019 in epic)   Vitamin D deficiency    Wears glasses     Past Surgical History:  Procedure Laterality Date   CESAREAN SECTION  x3   last one 1980   COLD KNIFE CERVICAL CONIZATION   10/07/1999  _0    COLONOSCOPY     negative per pt, Dr. Alice Reichert - [facility]   EXPLORATORY LAPAROTOMY MODIFIED RADICAL HYSTERECTOMY WITH PELVIC LYMPHADENECTOMY AND RESECTION MECKEL'S DIVERTICULUM  11-24-1999 dr Aldean Ast  _1    INCISIONAL HERNIA REPAIR N/A 05/23/2019   Procedure: LAPAROSCOPIC [place];  Surgeon: Kinsinger, Arta Bruce, MD;  Location: [facility];  Service: General;  Laterality: N/A;   LAPAROSCOPIC NEPHRECTOMY, HAND ASSISTED Left 09/19/2017   Procedure: LEFT HAND ASSISTED LAPAROSCOPIC NEPHRECTOMY;  Surgeon: Lucas Mallow, MD;  Location: [facility];  Service: Urology;  Laterality: Left;    There were no vitals filed for this visit.   Subjective Assessment - 04/14/21 0958     Subjective Not feeling pain that is 1-2/10. I still feel the heaviness on the left side. When the bowel movement is at the entrance she has the pain in the left side. AFter the bowel movement thee is a relief. Does  not feel like she empties her stool fully. She takes multiple things to have a bowel movement. Patinet pain is 90% better but still has pressure in the rectal area. Pressure is 75% better.    Patient Stated Goals reduce pain, improve balance, return to exercise    Currently in Pain? No/denies                [facility] PT Assessment - 04/14/21 0001       Assessment   Medical Diagnosis M62.838 levator ani spasm    Referring Provider (PT) Dr. [name]    Onset Date/Surgical Date --   1 year ago   Prior Therapy PT 2 times [facility]      Precautions   Precautions Other (comment)    Precaution Comments reneal cell cancer      Restrictions   Weight Bearing Restrictions No      [place] residence      Prior Function   Level of Independence Independent    Vocation Full time employment    Geneticist, molecular with sitting      Cognition   Overall Cognitive Status Within Functional Limits for tasks assessed      Observation/Other Assessments   Skin Integrity abdominal scars have decreased mobility      AROM   Lumbar Extension decreased by 25%      PROM   Right Hip Extension 10    Right Hip External Rotation  70    Left Hip Extension 10    Left Hip External Rotation  70      Palpation   Palpation comment tenderness located in the lower abdominals; imporved mobility of abdominal scars                        Pelvic Floor Special Questions - 04/14/21 0001     Urinary Leakage No    Fecal incontinence No   strain to have a bowel movement   Strength weak squeeze, no lift               [facility] Adult PT Treatment/Exercise - 04/14/21 0001       Self-Care   Self-Care Other Self-Care Comments    Other Self-Care Comments  discussed with patient on how to build a squatty potty to use at home to assist in toileting and bring her knees higher than her hip.; educated patinet on pelvic wand to work on her muscles and for management after therapy. educated patient on how to sit on a prickly ball to massage the pelvic floor msucle.      Manual Therapy   Manual Therapy Soft tissue mobilization;Myofascial release    Manual therapy comments to assess for dry needling    Soft tissue mobilization manual work to [employer] the gluteals, piriformis, hip rotators, coccygeus, and levator ani    Myofascial Release using the suction cup to release fascia throughout the gluteals and levator ani              Trigger Point Dry Needling - 04/14/21 0001     Consent Given? Yes    Education Handout Provided Yes    Muscles Treated Back/Hip Gluteus maximus;Gluteus medius;Gluteus minimus;Piriformis    Other Dry  Needling coccygeus    Gluteus Minimus Response Twitch response elicited;Palpable increased muscle length    Gluteus Medius Response Twitch response elicited;Palpable increased muscle length    Gluteus  Maximus Response Twitch response elicited;Palpable increased muscle length    Piriformis Response Palpable increased muscle length;Twitch response elicited                   PT Education - 04/14/21 1109     Education Details educated patient on dry needling    Person(s) Educated Patient    Methods Explanation;Handout    Comprehension Verbalized understanding              PT Short Term Goals - 03/31/21 1040       PT SHORT TERM GOAL #1   Title indpendent with hip stretches; diaphragmatic breathing; and scar massage    Time 4    Period Weeks    Status Achieved    Target Date 01/13/21      PT SHORT TERM GOAL #2   Title understand correct toileting techniques to reduce straining with bowel movements and empty the bowel completely    Time 4    Period Weeks    Status Achieved      PT SHORT TERM GOAL #3   Title able to fully empty her bladder due to the ability to relax her pelvic floor muscles and understands techniques to assist in emptying    Time 4    Period Weeks    Status Achieved    Target Date 01/13/21               PT Long Term Goals - 04/14/21 1109       PT LONG TERM GOAL #1   Title independent with advanced HEP for improve strength with a gentle exercise routine    Baseline continues to learn    Time 12    Period Weeks    Status On-going      PT LONG TERM GOAL #2   Title understand ways to manage her pain with breathing, body scanning, and medtiation    Baseline patient is using them and it helps    Time 12    Period Weeks    Status Achieved      PT LONG TERM GOAL #3   Title able to have a complete bowel movement every 2-3 days with pain decreased </= 1-2/10    Baseline BM 1x per day; pain level is 4/10; Type 4 but has to strain    Time  12    Period Weeks    Status On-going      PT LONG TERM GOAL #4   Title urinary leakage with activity decreased >/= 50% due to the ability to fully relax the pelvic floor and contract >/= 3/5 strength    Time 12    Period Weeks    Status Achieved                   Plan - 04/14/21 1110     Clinical Impression Statement Patient reports her rectal pain is 90% better. She has pelvic floor pressure that has reduced by 75%. she strains to have a bowel mvement. The stool will get stuck at the entrance and she will have disomfort on theleft side of the anus until the stool is released. Pelvic floor strength is 2/5. She has difficulty with relaxation of the pelvic floor. She will be looking at a stool to put under her feet to keep her knees higher than her hips. Patient was educated on using a vaginal wand to wrok on her trigger points located in the pelvic floor and home and  a way to manage her pain. Lumbar ROM for extension has increased by 25%. She has increased bilateral hip extension to 10 degrees and external rotation to 70 degrees. Patient is able to walk with improve fluid gait. Patient reports no urinary leakage. Patinet responded well to the dry needling and muscles elongated nicely. Patient will benefit from skilled therapy to reduce her muscle spasms to reduce the rectal and vaginal heaviness.    Personal Factors and Comorbidities Age;Comorbidity 3+;Fitness;Past/Current Experience;Time since onset of injury/illness/exacerbation    Comorbidities c-section x3; Hx of cervical cancer 11/24/1999; Hx of pulmonary emolus 10/09/2017; Hx o f renal cell cancer 09/19/2017; Lupus; insicional hernia repair 05/23/2019; radiacl lymphodenectomy 11/24/1999; cold knife cervical conizaaltion 10/07/1999    Examination-Activity Limitations Bed Mobility;Sit;Bend;Squat;Carry;Continence;Toileting;Lift;Locomotion Level;Transfers    Examination-Participation Restrictions Cleaning;Community Activity     Stability/Clinical Decision Making Evolving/Moderate complexity    Rehab Potential Good    PT Frequency 1x / week    PT Duration 12 weeks    PT Treatment/Interventions ADLs/Self Care Home Management;Aquatic Therapy;Electrical Stimulation;Therapeutic activities;Therapeutic exercise;Balance training;Neuromuscular re-education;Patient/family education;Manual techniques;Manual lymph drainage;Scar mobilization;Passive range of motion;Dry needling;Spinal Manipulations;Joint Manipulations    PT Next Visit Plan dry needling to the hamstring and possible gluteal; work on pelvic floor relaxation while on commode; dry needling to abdominal scar and reduce pain in lower abdominal area, abdominal bracing    PT Home Exercise Plan Access Code: [email or web address]    Recommended Other Services sent MD renewal    Consulted and Agree with Plan of Care Patient             Patient will benefit from skilled therapeutic intervention in order to improve the following deficits and impairments:  Decreased endurance, Decreased scar mobility, Decreased activity tolerance, Decreased strength, Increased fascial restricitons, Pain, Decreased balance, Decreased mobility, Increased muscle spasms, Difficulty walking, Decreased coordination, Decreased range of motion  Visit Diagnosis: Cramp and spasm - Plan: PT plan of care cert/re-cert  Muscle weakness (generalized) - Plan: PT plan of care cert/re-cert  Urinary incontinence, unspecified type - Plan: PT plan of care cert/re-cert     Problem List Patient Active Problem List   Diagnosis Date Noted   Insulin resistance 12/09/2020   Stage 3a chronic kidney disease ([place]) 10/29/2020   OSA on CPAP 01/16/2019   Rectocele 12/04/2017   History of pulmonary embolus (PE) 10/09/2017   History of renal cell carcinoma 10/09/2017   H/O left nephrectomy 10/09/2017   Depression 10/09/2017   Anxiety 10/09/2017   RLS (restless legs syndrome) 01/24/2015   Morbid obesity ([place])- BMI 30+ with  OSA 05/28/2014   SUI (stress urinary incontinence, female) 05/28/2014   Medication management 05/28/2014   Hypertension    Abnormal glucose (prediabetes)    Hyperlipidemia    Vitamin D deficiency    Environmental allergies 07/14/2006   Chronic constipation 07/14/2006   Lupus (systemic lupus erythematosus) ([place]) 07/14/2006    Earlie Counts, PT 04/14/21 11:19 AM  [place] [facility] [address] [place], [place], [address] Phone: [phone]   Fax:  [phone]  Name: Beth [name] MRN: [id number] Date of Birth: 13-Apr-1956  PHYSICAL THERAPY DISCHARGE SUMMARY  Visits from Start of Care: 9  Current functional level related to goals / functional outcomes: See above. Patient did not return to therapy after 04/14/2021.    Remaining deficits: See above.    Education / Equipment: HEP   Patient agrees to discharge. Patient goals were partially met. Patient is being discharged due to not returning  since the last visit. Thank you for the referral. Earlie Counts, PT 10/13/21 10:15 AM

## 2021-04-14 NOTE — Patient Instructions (Signed)

## 2021-04-15 ENCOUNTER — Ambulatory Visit: Payer: BC Managed Care – PPO | Admitting: Obstetrics and Gynecology

## 2021-04-15 DIAGNOSIS — M62838 Other muscle spasm: Secondary | ICD-10-CM | POA: Diagnosis not present

## 2021-04-15 MED ORDER — BUPIVACAINE HCL 0.25 % IJ SOLN
9.0000 mL | Freq: Once | INTRAMUSCULAR | Status: AC
  Administered 2021-04-14: 9 mL

## 2021-04-15 MED ORDER — TRIAMCINOLONE ACETONIDE 40 MG/ML IJ SUSP
40.0000 mg | Freq: Once | INTRAMUSCULAR | Status: AC
  Administered 2021-04-15: 40 mg via INTRAMUSCULAR

## 2021-04-15 NOTE — Addendum Note (Signed)
Addended by: [name] on: 04/15/2021 11:15 AM   Modules accepted: Orders

## 2021-04-16 ENCOUNTER — Encounter: Payer: BC Managed Care – PPO | Admitting: Physical Therapy

## 2021-04-17 ENCOUNTER — Ambulatory Visit: Payer: Self-pay | Admitting: Physical Therapy

## 2021-04-21 NOTE — Progress Notes (Signed)
Ms. Marcus Groll is a 65 y.o. female who presents for levator trigger point injection, Series #6  Pain has been improved now longer term. Has seen good improvement with her incontinence as well. Her pads are dry. Not taking Gemtesa.   Vitals:   04/22/21 1030  BP: 132/83  Pulse: 76    Indication(s): Levator spasm.   Informed Consent:  The alternatives, risks and benefits of the procedure were explained to the patient. Risks including, but not limited to discomfort, pain, bleeding, infection, injury to nearby structures, inability to perform the procedure, failure of the procedure were discussed.  All questions were answered and the patient elected to proceed.  Procedure:   The patient was positioned in dorsal lithotomy position.  The vaginal tissues were prepped with Hibiclens solution.  An injection of 10cc of a mixture of 90% anesthetic (0.25% Bupivacaine) and 10% 40mg /ml Triamcinolone acetomide (Kenalog) was performed in multiple in the bilateral levator muscles, a total of 5 injection sites.  Pressure was held over bleeding areas until good hemostasis was achieved.   The patient tolerated the procedure well with no apparent complications.  Return 1 month for follow up  [name], MD

## 2021-04-22 ENCOUNTER — Encounter: Payer: Self-pay | Admitting: Obstetrics and Gynecology

## 2021-04-22 ENCOUNTER — Ambulatory Visit (INDEPENDENT_AMBULATORY_CARE_PROVIDER_SITE_OTHER): Payer: BC Managed Care – PPO | Admitting: Obstetrics and Gynecology

## 2021-04-22 ENCOUNTER — Other Ambulatory Visit: Payer: Self-pay

## 2021-04-22 VITALS — BP 132/83 | HR 76

## 2021-04-22 DIAGNOSIS — M62838 Other muscle spasm: Secondary | ICD-10-CM | POA: Diagnosis not present

## 2021-04-22 MED ORDER — BUPIVACAINE HCL 0.25 % IJ SOLN
9.0000 mL | Freq: Once | INTRAMUSCULAR | Status: AC
Start: 2021-04-22 — End: 2021-04-22
  Administered 2021-04-22: 9 mL

## 2021-04-22 MED ORDER — TRIAMCINOLONE ACETONIDE 40 MG/ML IJ SUSP
40.0000 mg | Freq: Once | INTRAMUSCULAR | Status: AC
Start: 2021-04-22 — End: 2021-04-22
  Administered 2021-04-22: 40 mg via INTRAMUSCULAR

## 2021-05-13 ENCOUNTER — Other Ambulatory Visit: Payer: Self-pay | Admitting: Nurse Practitioner

## 2021-05-13 ENCOUNTER — Other Ambulatory Visit: Payer: Self-pay | Admitting: Adult Health

## 2021-05-13 DIAGNOSIS — F419 Anxiety disorder, unspecified: Secondary | ICD-10-CM

## 2021-05-13 MED ORDER — ALPRAZOLAM 0.5 MG PO TABS: ORAL_TABLET | ORAL | 0 refills | Status: DC

## 2021-05-13 MED ORDER — ALPRAZOLAM 0.5 MG PO TABS
ORAL_TABLET | ORAL | 0 refills | Status: DC
Start: 2021-05-13 — End: 2021-06-17

## 2021-05-26 ENCOUNTER — Other Ambulatory Visit: Payer: Self-pay | Admitting: Adult Health

## 2021-05-29 ENCOUNTER — Encounter: Payer: Self-pay | Admitting: Adult Health

## 2021-06-16 ENCOUNTER — Encounter: Payer: Self-pay | Admitting: Adult Health

## 2021-06-17 ENCOUNTER — Other Ambulatory Visit: Payer: Self-pay | Admitting: Nurse Practitioner

## 2021-06-17 DIAGNOSIS — F419 Anxiety disorder, unspecified: Secondary | ICD-10-CM

## 2021-06-18 MED ORDER — ALPRAZOLAM 0.5 MG PO TABS: ORAL_TABLET | ORAL | 0 refills | Status: DC

## 2021-06-19 ENCOUNTER — Other Ambulatory Visit: Payer: Self-pay | Admitting: Adult Health

## 2021-06-19 ENCOUNTER — Encounter: Payer: Self-pay | Admitting: Adult Health

## 2021-06-19 DIAGNOSIS — F419 Anxiety disorder, unspecified: Secondary | ICD-10-CM

## 2021-06-19 MED ORDER — VIBEGRON 75 MG PO TABS: 1.0000 | ORAL_TABLET | Freq: Every day | ORAL | 1 refills | Status: DC

## 2021-06-20 ENCOUNTER — Other Ambulatory Visit: Payer: Self-pay | Admitting: Adult Health

## 2021-06-20 MED ORDER — VIBEGRON 75 MG PO TABS: 1.0000 | ORAL_TABLET | Freq: Every day | ORAL | 1 refills | Status: DC

## 2021-06-29 ENCOUNTER — Encounter: Payer: Self-pay | Admitting: Obstetrics and Gynecology

## 2021-06-29 ENCOUNTER — Ambulatory Visit: Payer: BC Managed Care – PPO | Admitting: Obstetrics and Gynecology

## 2021-06-29 ENCOUNTER — Other Ambulatory Visit: Payer: Self-pay

## 2021-06-29 VITALS — BP 117/75 | HR 76

## 2021-06-29 DIAGNOSIS — M62838 Other muscle spasm: Secondary | ICD-10-CM | POA: Diagnosis not present

## 2021-06-29 DIAGNOSIS — N816 Rectocele: Secondary | ICD-10-CM

## 2021-06-29 DIAGNOSIS — K5909 Other constipation: Secondary | ICD-10-CM | POA: Diagnosis not present

## 2021-06-29 MED ORDER — CYCLOBENZAPRINE HCL 5 MG PO TABS: 5.0000 mg | ORAL_TABLET | Freq: Three times a day (TID) | ORAL | 2 refills | Status: DC | PRN

## 2021-06-29 NOTE — Progress Notes (Signed)
[facility] Return Visit  SUBJECTIVE  History of Present Illness: Beth [name] is a 66 y.o. female seen in follow-up for pelvic pain/ levator spasm and OAB.   She completed a series of trigger point injections. This did not give her long relief. She completed her PT and had improved but pain is back again. Has pelvic floor pressure, around the rectum, and on the left side specifically with walking. She is doing pelvic floor massage with the wand about 3 times per week. Having bowel movements only 2-3 times per week. She is taking metamucil 2-3 times per day. She feels the stool get stuck in the vagina when she is constipated.   Has been on Gemtesa for OAB- she had stopped it then symptoms returned. Has been helping with the leakage.    Past Medical History: Patient  has a past medical history of Anxiety, Chronic constipation, CKD (chronic kidney disease), stage III ([place]), Enterocele, History of cervical cancer, History of MRSA infection, History of pulmonary embolus (PE), History of renal cell carcinoma, History of seizure, Incisional hernia, MDD (major depressive disorder), OA (osteoarthritis), OSA on CPAP, Pre-diabetes, Rectocele, RLS (restless legs syndrome) (01/24/2015), Solitary right kidney, SUI (stress urinary incontinence, female), Systemic lupus erythematosus ([name]), Vitamin D deficiency, and Wears glasses.   Past Surgical History: She  has a past surgical history that includes Cesarean section (x3   last one 1980); Laparoscopic nephrectomy, hand assisted (Left, 09/19/2017); Colonoscopy; EXPLORATORY LAPAROTOMY MODIFIED RADICAL HYSTERECTOMY WITH PELVIC LYMPHADENECTOMY AND RESECTION MECKEL'S DIVERTICULUM (11-24-1999 dr Aldean Ast  @[facility] ); COLD KNIFE CERVICAL CONIZATION  (10/07/1999  @[facility] ); and Incisional hernia repair (N/A, 05/23/2019).   Medications: She has a current medication list which includes the following prescription(s): acetaminophen, alprazolam, azathioprine, bupropion,  vitamin d, cyclobenzaprine, escitalopram, gabapentin, magnesium, ozempic (1 mg/dose), and vibegron.   Allergies: Patient is allergic to bactrim [sulfamethoxazole-trimethoprim], cymbalta [duloxetine hcl], and acyclovir and related.   Social History: Patient  reports that she has never smoked. She has never used smokeless tobacco. She reports that she does not drink alcohol and does not use drugs.      OBJECTIVE     Physical Exam: Vitals:   06/29/21 1051  BP: 117/75  Pulse: 76   Gen: No apparent distress, A&O x 3.  Detailed Urogynecologic Evaluation:  Normal external genitalia. Digital exam showed point tenderness along the levator muscles which reproduces her pain/ pressure.  Speculum exam shows normal vaginal mucosa.  Rectal exam has good sphincter tone, no masses.   POP-Q  -3                                            Aa   -3                                           Ba  -8.5                                              C   3  Gh  4                                            Pb  9                                            tvl   -1.5                                            Ap  -1.5                                            Bp                                                 D      ASSESSMENT AND PLAN    Beth [name] is a 66 y.o. with:  1. Prolapse of posterior vaginal wall   2. Levator spasm   3. Chronic constipation     Posterior vaginal wall prolapse - We discussed that she has mild posterior vaginal wall prolapse but that it is not the cause of her pelvic pressure/ pain.  - It may be contributing to stool getting "stuck" but she also has hard stools and constipation.  - Would not recommend surgery for this at this time, but should work on pelvic floor muscle spasm and constipation prior to pursing surgery (posterior repair).   2. Levator spasm - Has tried pelvic floor trigger point injections and pelvic  PT - Previously prescribed vaginal valium but was unable to take at the same time as her xanax.  -will prescribe flexeril 5mg  to take nightly for muscle spasm  3. Constipation - For constipation, we reviewed the importance of a better bowel regimen.  We also discussed the importance of avoiding chronic straining, as it can exacerbate her pelvic floor symptoms; we discussed treating constipation and straining prior to surgery, as postoperative straining can lead to damage to the repair and recurrence of symptoms. - - We discussed therapy with daily miralax and metamucil.   Return 1 month to review progress  [name], MD

## 2021-06-29 NOTE — Patient Instructions (Addendum)
Take flexeril every day at night for muscle spasm.   Take fiber (metamucil) plus miralax once daily.

## 2021-07-05 ENCOUNTER — Encounter: Payer: Self-pay | Admitting: Adult Health

## 2021-07-29 ENCOUNTER — Encounter: Payer: Self-pay | Admitting: Obstetrics and Gynecology

## 2021-07-29 ENCOUNTER — Ambulatory Visit: Payer: BC Managed Care – PPO | Admitting: Obstetrics and Gynecology

## 2021-07-29 ENCOUNTER — Other Ambulatory Visit: Payer: Self-pay

## 2021-07-29 VITALS — BP 162/82 | HR 93

## 2021-07-29 DIAGNOSIS — K5909 Other constipation: Secondary | ICD-10-CM | POA: Diagnosis not present

## 2021-07-29 DIAGNOSIS — N816 Rectocele: Secondary | ICD-10-CM

## 2021-07-29 DIAGNOSIS — M62838 Other muscle spasm: Secondary | ICD-10-CM

## 2021-07-29 DIAGNOSIS — N3281 Overactive bladder: Secondary | ICD-10-CM

## 2021-07-29 NOTE — Progress Notes (Signed)
[facility] ?Return Visit ? ?SUBJECTIVE  ?History of Present Illness: ?Beth [name] is a 66 y.o. female seen in follow-up for pelvic pain/ levator spasm and OAB.  ? ?Still has the pressure feeling, especially around the rectum. She is working on some pelvic floor stretches at home. She feels that overall is 30-40% better.  ? ?Started the miralax and is having a bowel movement once a day.  ? ?Has been on Gemtesa for OAB- has improvement with the urgency but sometimes feels she is straining to urinate.  ? ? ?Past Medical History: ?Patient  has a past medical history of Anxiety, Chronic constipation, CKD (chronic kidney disease), stage III ([place]), Enterocele, History of cervical cancer, History of MRSA infection, History of pulmonary embolus (PE), History of renal cell carcinoma, History of seizure, Incisional hernia, MDD (major depressive disorder), OA (osteoarthritis), OSA on CPAP, Pre-diabetes, Rectocele, RLS (restless legs syndrome) (01/24/2015), Solitary right kidney, SUI (stress urinary incontinence, female), Systemic lupus erythematosus ([place]), Vitamin D deficiency, and Wears glasses.  ? ?Past Surgical History: ?She  has a past surgical history that includes Cesarean section (x3   last one 1980); Laparoscopic nephrectomy, hand assisted (Left, 09/19/2017); Colonoscopy; EXPLORATORY LAPAROTOMY MODIFIED RADICAL HYSTERECTOMY WITH PELVIC LYMPHADENECTOMY AND RESECTION MECKEL'S DIVERTICULUM (11-24-1999 dr Aldean Ast  '@[facility] ); COLD KNIFE CERVICAL CONIZATION  (10/07/1999  '@[facility] ); and Incisional hernia repair (N/A, 05/23/2019).  ? ?Medications: ?She has a current medication list which includes the following prescription(s): acetaminophen, alprazolam, azathioprine, bupropion, vitamin d, cyclobenzaprine, escitalopram, gabapentin, magnesium, ozempic (1 mg/dose), and vibegron.  ? ?Allergies: ?Patient is allergic to bactrim [sulfamethoxazole-trimethoprim], cymbalta [duloxetine hcl], and acyclovir and related.  ? ?Social  History: ?Patient  reports that she has never smoked. She has never used smokeless tobacco. She reports that she does not drink alcohol and does not use drugs.  ?  ?  ?OBJECTIVE  ?  ? ?Physical Exam: ?Vitals:  ? 07/29/21 0951  ?BP: (!) 162/82  ?Pulse: 93  ? ? ?Gen: No apparent distress, A&O x 3. ? ?Prior exam:  ?Normal external genitalia. Digital exam showed point tenderness along the levator muscles which reproduces her pain/ pressure.  ?Speculum exam shows normal vaginal mucosa.  ?Rectal exam has good sphincter tone, no masses.  ?POP-Q ?  ?-3  ?                                          Aa   ?-3 ?                                          Ba   ?-8.5  ?                                            C  ?  ?3  ?                                          Gh   ?4  ?  Pb   ?9  ?                                          tvl  ?  ?-1.5  ?                                          Ap   ?-1.5  ?                                          Bp   ?   ?                                            D  ?  ? ?  ? ?ASSESSMENT AND PLAN  ?  ?Ms. Dewalt is a 66 y.o. with:  ?1. Prolapse of posterior vaginal wall   ?2. Levator spasm   ?3. Chronic constipation   ?4. Overactive bladder   ? ? ?- Has had good improvement with both constipation and overactive bladder symptoms.  ?- For her pelvic floor pressure and discomfort, we discussed posterior vaginal prolapse repair, and also reviewed that will not improve her pelvic floor muscle spasm. She had some improvement with the trigger point injections, but this did not last for her. We reviewed the option of pelvic floor muscle botox. She would like this to be done together with the prolapse repair in the OR.  ?- We will work on Biochemist, clinical and call to schedule her procedures.  ? ? ? ?[name], MD ? ? ? ?

## 2021-07-30 ENCOUNTER — Other Ambulatory Visit: Payer: Self-pay | Admitting: Adult Health

## 2021-07-31 ENCOUNTER — Other Ambulatory Visit: Payer: Self-pay | Admitting: Adult Health

## 2021-07-31 DIAGNOSIS — Z79899 Other long term (current) drug therapy: Secondary | ICD-10-CM

## 2021-07-31 DIAGNOSIS — F419 Anxiety disorder, unspecified: Secondary | ICD-10-CM

## 2021-07-31 DIAGNOSIS — F3341 Major depressive disorder, recurrent, in partial remission: Secondary | ICD-10-CM

## 2021-08-03 ENCOUNTER — Encounter: Payer: Self-pay | Admitting: Adult Health

## 2021-08-04 ENCOUNTER — Other Ambulatory Visit: Payer: Self-pay | Admitting: Adult Health

## 2021-08-04 ENCOUNTER — Encounter: Payer: Self-pay | Admitting: Obstetrics and Gynecology

## 2021-08-04 MED ORDER — GABAPENTIN 300 MG PO CAPS: ORAL_CAPSULE | ORAL | 3 refills | Status: DC

## 2021-08-13 ENCOUNTER — Encounter (INDEPENDENT_AMBULATORY_CARE_PROVIDER_SITE_OTHER): Payer: Self-pay

## 2021-08-26 ENCOUNTER — Encounter: Payer: Self-pay | Admitting: Adult Health

## 2021-08-28 ENCOUNTER — Encounter: Payer: BC Managed Care – PPO | Admitting: Obstetrics and Gynecology

## 2021-09-01 ENCOUNTER — Other Ambulatory Visit: Payer: Self-pay | Admitting: Adult Health

## 2021-09-01 DIAGNOSIS — F419 Anxiety disorder, unspecified: Secondary | ICD-10-CM

## 2021-09-01 NOTE — Progress Notes (Signed)
FOLLOW UP 3 MONTHS ? ?Assessment and Plan: ? ? ?Essential hypertension ?Monitor blood pressure at home; call if consistently over 130/80 ?Reviewed DASH diet.   ?Reminder to go to the ER if any CP, SOB, nausea, dizziness, severe HA, changes vision/speech, left arm numbness and tingling and jaw pain. ? ?Hyperlipidemia, unspecified hyperlipidemia type ?Managed by lifestyle modification at this time ?LDL goal <100 ?Decrease cholesterol, saturated fat; increase soluble fiber ?increase activity.  ?-     Lipid panel ? ?Systemic lupus erythematosus, unspecified SLE type, unspecified organ involvement status ([place]) ?[name] following; doing well on imuran ? ?Abnormal glucose (prediabetes) ?Discussed disease and risks ?Discussed diet/exercise, weight management  ?Check A1C q2m CMP for glucose otherwise ? ?CKD III ([place] ?Increase fluids, avoid NSAIDS, monitor sugars ?Check renal functions at each visit ?      -     CMP/GFR ? ?[place] - BMI 30+ with OSA  ?- follow up 3 months for progress monitoring ?- long discussion about weight loss, diet, and exercise ?- has failed phentermine, topiramate, wellbutrin ?- she did try semaglutide, didn't perceive benefit and stopped ?- concern with inadequate caloric intake - this explains her lack of benefit with appetite reducing medications ?- strategies for chocolate/evening sugar cravings reviewed ?- discussed metabolism basics; discussed need for sufficient intake, prioritize protein, after recovery from pelvic surgery recommend pelvic PT followed by PT guided resistance exercise regimen ?- schedule follow up 3 months ? ?Medication management ?-     Magnesium ? ?Recurrent major depressive disorder, in partial remission (HCC)/xanax ?- continue medications, try to limit xanax use  ?stress management techniques discussed, increase water, good sleep hygiene discussed, increase exercise, and increase veggies.  ?  ?Vitamin D deficiency ?Continue supplementation to maintain  goal of 70-100 ?Taking Vitamin D  IU daily ?Defer vitamin D level ? ? ? ?Further disposition pending results if labs check today. Discussed med's effects and SE's.   ?Over 30 minutes of face to face interview, exam, counseling, chart review, and critical decision making was performed.  ? ? ?Future Appointments  ?Date Time Provider [place] ?09/03/2021 11:40 AM SJaquita Folds [facility] ?10/21/2021 11:20 AM SJaquita Folds [facility] ?03/18/2022  3:00 PM CLiane Comber NP [facility] None  ? ? ?HPI ?66y.o. AA female patient presents for 3 month follow up on htn, hyperlipidemia, glucose, morbid obesity and vitamin D Def.  ? ?She has rectocele, stress incontinence, pelvic pain episodes, chronic constipation. Suspected element of pelvic dysfunction, was recently referred to a local uro/gyn Dr. MSherlene Shamsby Dr. GCarlean Purlfor her input and repeat pelvic floor PT. She has been on linzess, miralax for constipation. She has upcoming posterior rectocele repair planned 09/10/2021.  ? ?She has SLE and follows with Dr. BAmil Amen switched from from plaquenil to imuran and notes improvement with this med.  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  ? ?She has depression and anxiety, on lexapro/wellbutrin, was following with Dr. LArdath Sax  She is feeling that she is doing better with her anxiety. Reports xanax PRN, 3 days a week.  ? ?BMI is Body mass index is 38.29 kg/m?., she is working on diet.  ?Exercises limited by pelvic discomfort.  ?Formerly on phentermine/topamax, lack of benefit and stopped. ?Recently on ozempic up to 1 mg/week, lack of benefit and stopped ?Today presents with food tracking app showing caloric goal of 1460, some days (weekends) consuming <500 cal, reports does measure and log  all foods, eats very little on the weekends on occasion   ?Reports 1 gallon water ?Yogurt with nuts and protein powder ?Salad with chicken or beans ?No fried foods ?Chicken with  veggies for dinner  ?She admits will crave chocolate in the evenings, will consume to excess, 3-4 ice cream sandwhiches or snickers bars when she does consume (not daily) ?Wt Readings from Last 3 Encounters:  ?09/02/21 206 lb (93.4 kg)  ?04/14/21 193 lb (87.5 kg)  ?03/25/21 201 lb (91.2 kg)  ? ?She has not been checking BP at home but notes elevated at last few OVs, today their BP is BP: 130/72 ? She does not workout. She denies chest pain, shortness of breath, dizziness. ? ?Patient's cholesterol is currently managed by lifestyle. The patient's cholesterol last visit was  ?Lab Results  ?Component Value Date  ? CHOL 197 03/18/2021  ? HDL 43 (L) 03/18/2021  ? LDLCALC 130 (H) 03/18/2021  ? TRIG 128 03/18/2021  ? CHOLHDL 4.6 03/18/2021  ? ?The patient has been working on diet for prediabetes, denies changes in vision, polys, and paresthesias. She was started on ozempic but she reports stopped several weeks back due to lack of perceived benefit for appetite ?Last A1C in office was ?Lab Results  ?Component Value Date  ? HGBA1C 6.1 (H) 03/18/2021  ? ?She underwent left laparoscopic nephrectomy by Dr. Link Snuffer on 09/19/2017 for left renal mass and carcinoma, followed by provoked PE, off of anticoagulation. She now has CKD III that is stable.  ?Lab Results  ?Component Value Date  ? EGFR 59 (L) 03/18/2021  ? ?Vitamin D deficiency, on vitamin D ?Lab Results  ?Component Value Date  ? VD25OH 72 03/18/2021  ? ? ? ? ?Current Medications:  ? ?Current Outpatient Medications (Endocrine & Metabolic):  ?  Semaglutide, 1 MG/DOSE, (OZEMPIC, 1 MG/DOSE,) 4 MG/3ML SOPN, Inject 1 mg into the skin once a week. ? ? ? ?Current Outpatient Medications (Analgesics):  ?  acetaminophen (TYLENOL) 500 MG tablet, Take 1,000 mg by mouth every 6 (six) hours as needed for mild pain. ? ? ?Current Outpatient Medications (Other):  ?  ALPRAZolam (XANAX) 0.5 MG tablet, TAKE 1/2-1 TABLET 1-2 TIMES A DAY ONLY IF NEEDED FOR PANIC/ANXIETY ATTACK. LIMIT TO MAX 5  DAYS/WEEK TO AVOID ADDICTION & DEMENTIA. CONTINUE TO TAPER DOWN ON USE WITH GOAL TO STOP THIS MEDICATION. ?  azaTHIOprine (IMURAN) 50 MG tablet, Take 50 mg by mouth 2 (two) times daily. ?  buPROPion (WELLBUTRIN XL) 150 MG 24 hr tablet, TAKE 1 TABLET EVERY MORNING FOR MOOD, FOCUS & CONCENTRATION ?  Cholecalciferol (VITAMIN D) 2000 UNITS CAPS, Take 1 capsule by mouth daily. ?  escitalopram (LEXAPRO) 20 MG tablet, TAKE 1.5 TABLETS (30 MG) DAILY FOR MOOD & CHRONIC ANXIETY ?  gabapentin (NEURONTIN) 300 MG capsule, TAKE 1-2 CAPS AT NIGHT AS NEEDED FOR ANXIETY, PAIN AND SLEEP. ?  Magnesium 500 MG TABS, Take 500 mg by mouth daily.  ? ? ?Medical History:  ?Past Medical History:  ?Diagnosis Date  ? Anxiety   ? Chronic constipation   ? followed by dr Carlean Purl  ? CKD (chronic kidney disease), stage III ([place])   ? Enterocele   ? History of cervical cancer   ? hx squamous cell cervical carcinoma,   s/p  radical hysterectomy 11-24-1999  ? History of MRSA infection   ? 2005--- buttock abscess  ? History of pulmonary embolus (PE)   ? post op 10-09-2017 (surgery on 09-19-2017)  lower range pulmonary artery,  resolved and completed 6 month Eliquis  ? History of renal cell carcinoma   ? s/p  left nephrectomy 09-19-2017  (oncologist -- dr Alen Blew / urologsit-- dr bell)  ? History of seizure   ? 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 Incisional hernia   ? MDD (major depressive disorder)   ? OA (osteoarthritis)   ? OSA on CPAP   ? followed by dr dohmeier  ? Pre-diabetes   ? Rectocele   ? RLS (restless legs syndrome) 01/24/2015  ? Per sleep study 05/2014   ? Solitary right kidney   ? acquired 09-19-2017 s/p left nephrectomy  ? SUI (stress urinary incontinence, female)   ? 05-21-2019  improved with therapy  ? Systemic lupus erythematosus ([place])   ? rheumotologist--- dr Amil Amen (lov 03-19-2019 in epic)   ? Vitamin D deficiency   ? Wears glasses   ? ?Allergies ?Allergies  ?Allergen Reactions  ? Bactrim [Sulfamethoxazole-Trimethoprim]   ?  GI upset  ? Cymbalta [Duloxetine Hcl]   ?  Sweating  ? Acyclovir And Re

## 2021-09-02 ENCOUNTER — Encounter: Payer: Self-pay | Admitting: Adult Health

## 2021-09-02 ENCOUNTER — Other Ambulatory Visit: Payer: Self-pay | Admitting: Adult Health

## 2021-09-02 ENCOUNTER — Ambulatory Visit: Payer: BC Managed Care – PPO | Admitting: Adult Health

## 2021-09-02 DIAGNOSIS — G4733 Obstructive sleep apnea (adult) (pediatric): Secondary | ICD-10-CM | POA: Diagnosis not present

## 2021-09-02 DIAGNOSIS — E785 Hyperlipidemia, unspecified: Secondary | ICD-10-CM

## 2021-09-02 DIAGNOSIS — I10 Essential (primary) hypertension: Secondary | ICD-10-CM | POA: Diagnosis not present

## 2021-09-02 DIAGNOSIS — F419 Anxiety disorder, unspecified: Secondary | ICD-10-CM

## 2021-09-02 DIAGNOSIS — R7309 Other abnormal glucose: Secondary | ICD-10-CM

## 2021-09-02 DIAGNOSIS — Z9989 Dependence on other enabling machines and devices: Secondary | ICD-10-CM

## 2021-09-02 DIAGNOSIS — N1831 Chronic kidney disease, stage 3a: Secondary | ICD-10-CM

## 2021-09-02 DIAGNOSIS — Z79899 Other long term (current) drug therapy: Secondary | ICD-10-CM | POA: Diagnosis not present

## 2021-09-02 DIAGNOSIS — M329 Systemic lupus erythematosus, unspecified: Secondary | ICD-10-CM

## 2021-09-02 MED ORDER — ALPRAZOLAM 0.5 MG PO TABS: ORAL_TABLET | ORAL | 0 refills | Status: DC

## 2021-09-03 ENCOUNTER — Encounter: Payer: Self-pay | Admitting: Obstetrics and Gynecology

## 2021-09-03 ENCOUNTER — Ambulatory Visit (INDEPENDENT_AMBULATORY_CARE_PROVIDER_SITE_OTHER): Payer: BC Managed Care – PPO | Admitting: Obstetrics and Gynecology

## 2021-09-03 ENCOUNTER — Other Ambulatory Visit: Payer: Self-pay | Admitting: Adult Health

## 2021-09-03 VITALS — BP 132/86 | HR 74

## 2021-09-03 DIAGNOSIS — N816 Rectocele: Secondary | ICD-10-CM | POA: Diagnosis not present

## 2021-09-03 DIAGNOSIS — M62838 Other muscle spasm: Secondary | ICD-10-CM

## 2021-09-03 DIAGNOSIS — Z01818 Encounter for other preprocedural examination: Secondary | ICD-10-CM

## 2021-09-03 DIAGNOSIS — E785 Hyperlipidemia, unspecified: Secondary | ICD-10-CM

## 2021-09-03 LAB — LIPID PANEL
Cholesterol: 193 mg/dL (ref <200)
HDL: 36 mg/dL — ABNORMAL LOW (ref >=50)
LDL Cholesterol (Calc): 124 mg/dL (calc) — ABNORMAL HIGH
Non-HDL Cholesterol (Calc): 157 mg/dL (calc) — ABNORMAL HIGH (ref <130)
Total CHOL/HDL Ratio: 5.4 (calc) — ABNORMAL HIGH (ref <5.0)
Triglycerides: 210 mg/dL — ABNORMAL HIGH (ref <150)

## 2021-09-03 LAB — CBC WITH DIFFERENTIAL/PLATELET
Absolute Monocytes: 488 cells/uL (ref 200–950)
Basophils Absolute: 12 cells/uL (ref 0–200)
Basophils Relative: 0.3 %
Eosinophils Absolute: 78 cells/uL (ref 15–500)
Eosinophils Relative: 2 %
HCT: 42.1 % (ref 35.0–45.0)
Hemoglobin: 13.8 g/dL (ref 11.7–15.5)
Lymphs Abs: 1073 cells/uL (ref 850–3900)
MCH: 30 pg (ref 27.0–33.0)
MCHC: 32.8 g/dL (ref 32.0–36.0)
MCV: 91.5 fL (ref 80.0–100.0)
MPV: 9.1 fL (ref 7.5–12.5)
Monocytes Relative: 12.5 %
Neutro Abs: 2250 cells/uL (ref 1500–7800)
Neutrophils Relative %: 57.7 %
Platelets: 290 10*3/uL (ref 140–400)
RBC: 4.6 10*6/uL (ref 3.80–5.10)
RDW: 12.5 % (ref 11.0–15.0)
Total Lymphocyte: 27.5 %
WBC: 3.9 10*3/uL (ref 3.8–10.8)

## 2021-09-03 LAB — COMPLETE METABOLIC PANEL WITH GFR
AG Ratio: 1.2 (calc) (ref 1.0–2.5)
ALT: 13 U/L (ref 6–29)
AST: 16 U/L (ref 10–35)
Albumin: 4.1 g/dL (ref 3.6–5.1)
Alkaline phosphatase (APISO): 54 U/L (ref 37–153)
BUN: 18 mg/dL (ref 7–25)
CO2: 25 mmol/L (ref 20–32)
Calcium: 10.3 mg/dL (ref 8.6–10.4)
Chloride: 105 mmol/L (ref 98–110)
Creat: 1.01 mg/dL (ref 0.50–1.05)
Globulin: 3.5 g/dL (calc) (ref 1.9–3.7)
Glucose, Bld: 90 mg/dL (ref 65–99)
Potassium: 4.4 mmol/L (ref 3.5–5.3)
Sodium: 139 mmol/L (ref 135–146)
Total Bilirubin: 0.4 mg/dL (ref 0.2–1.2)
Total Protein: 7.6 g/dL (ref 6.1–8.1)
eGFR: 61 mL/min/{1.73_m2} (ref >=60)

## 2021-09-03 LAB — MAGNESIUM: Magnesium: 1.8 mg/dL (ref 1.5–2.5)

## 2021-09-03 LAB — TSH: TSH: 0.8 mIU/L (ref 0.40–4.50)

## 2021-09-03 LAB — HEMOGLOBIN A1C
Hgb A1c MFr Bld: 6.1 % of total Hgb — ABNORMAL HIGH (ref <5.7)
Mean Plasma Glucose: 128 mg/dL
eAG (mmol/L): 7.1 mmol/L

## 2021-09-03 MED ORDER — ROSUVASTATIN CALCIUM 5 MG PO TABS: ORAL_TABLET | ORAL | 3 refills | Status: AC

## 2021-09-03 MED ORDER — OXYCODONE HCL 5 MG PO TABS: 5.0000 mg | ORAL_TABLET | ORAL | 0 refills | Status: DC | PRN

## 2021-09-03 MED ORDER — ACETAMINOPHEN 500 MG PO TABS: 500.0000 mg | ORAL_TABLET | Freq: Four times a day (QID) | ORAL | 0 refills | Status: DC | PRN

## 2021-09-03 MED ORDER — IBUPROFEN 600 MG PO TABS: 600.0000 mg | ORAL_TABLET | Freq: Four times a day (QID) | ORAL | 0 refills | Status: DC | PRN

## 2021-09-03 NOTE — Progress Notes (Signed)
[facility] ?Pre-Operative visit ? ?Subjective ?Chief Complaint: Beth [name] presents for a preoperative encounter.  ? ?History of Present Illness: ?Beth [name] is a 66 y.o. female who presents for preoperative visit.  She is scheduled to undergo Exam under anesthesia, posterior repair, pelvic floor trigger point injections with botox on 09/10/21.  Her symptoms include vaginal pressure, and she was was found to have Stage I anterior, Stage I posterior, Stage II apical prolapse. ? ? ?Past Medical History:  ?Diagnosis Date  ? Anxiety   ? Chronic constipation   ? followed by dr Carlean Purl  ? CKD (chronic kidney disease), stage III ([place])   ? Enterocele   ? History of cervical cancer   ? hx squamous cell cervical carcinoma,   s/p  radical hysterectomy 11-24-1999  ? History of MRSA infection   ? 2005--- buttock abscess  ? History of pulmonary embolus (PE)   ? post op 10-09-2017 (surgery on 09-19-2017)  lower range pulmonary artery,  resolved and completed 6 month Eliquis  ? History of renal cell carcinoma   ? s/p  left nephrectomy 09-19-2017  (oncologist -- dr Alen Blew / urologsit-- dr bell)  ? History of seizure   ? 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 Incisional hernia   ? MDD (major depressive disorder)   ? OA (osteoarthritis)   ? OSA on CPAP   ? followed by dr dohmeier  ? Pre-diabetes   ? Rectocele   ? RLS (restless legs syndrome) 01/24/2015  ? Per sleep study 05/2014   ? Solitary right kidney   ? acquired 09-19-2017 s/p left nephrectomy  ? SUI (stress urinary incontinence, female)   ? 05-21-2019  improved with therapy  ? Systemic lupus erythematosus ([place])   ? rheumotologist--- dr Amil Amen (lov 03-19-2019 in epic)  ? Vitamin D deficiency   ? Wears glasses   ?  ? ?Past Surgical History:  ?Procedure Laterality Date  ? CESAREAN SECTION  x3   last one 1980  ? COLD  KNIFE CERVICAL CONIZATION   10/07/1999  '@[facility]'$   ? COLONOSCOPY    ? negative per pt, Dr. Alice Reichert - [facility]  ? EXPLORATORY LAPAROTOMY MODIFIED RADICAL HYSTERECTOMY WITH PELVIC LYMPHADENECTOMY AND RESECTION MECKEL'S DIVERTICULUM  11-24-1999 dr Aldean Ast  '@[facility]'$   ? [place] N/A 05/23/2019  ? Procedure: LAPAROSCOPIC INCISIONAL HERNIA REPAIR WITH MESH;  Surgeon: Kinsinger, Arta Bruce, MD;  [facility]: [facility];  Service: General;  Laterality: N/A;  ? LAPAROSCOPIC NEPHRECTOMY, HAND ASSISTED Left 09/19/2017  ? Procedure: LEFT HAND ASSISTED LAPAROSCOPIC NEPHRECTOMY;  Surgeon: Lucas Mallow, MD;  Location: [facility];  Service: Urology;  Laterality: Left;  ? ? ?is allergic to bactrim [sulfamethoxazole-trimethoprim], cymbalta [duloxetine hcl], and acyclovir and related.  ? ?Family History  ?Problem Relation Age of Onset  ? Hyperlipidemia Mother   ? Hypertension Mother   ? Heart disease Mother   ? ? ?Social History  ? ?Tobacco Use  ? Smoking status: Never  ? Smokeless tobacco: Never  ?Vaping Use  ? Vaping Use: Never used  ?Substance Use Topics  ? Alcohol use: No  ? Drug use: Never  ? ? ? ?Review of Systems was negative for a full 10 system review except as noted in the History of Present Illness. ? ? ?Current Outpatient Medications:  ?  acetaminophen (TYLENOL) 500 MG tablet,  Take 1,000 mg by mouth every 6 (six) hours as needed for mild pain., Disp: , Rfl:  ?  ALPRAZolam (XANAX) 0.5 MG tablet, TAKE 1/2-1 TABLET 1-2 TIMES A DAY ONLY IF NEEDED FOR PANIC/ANXIETY ATTACK. LIMIT TO MAX 5 DAYS/WEEK TO AVOID ADDICTION & DEMENTIA. CONTINUE TO TAPER DOWN ON USE WITH GOAL TO STOP THIS MEDICATION. Strength: 0.5 mg, Disp: 40 tablet, Rfl: 0 ?  ALPRAZolam (XANAX) 0.5 MG tablet, TAKE 1/2-1 TABLET 1-2 TIMES A DAY ONLY IF NEEDED FOR PANIC/ANXIETY ATTACK. LIMIT TO MAX 5 DAYS/WEEK TO AVOID ADDICTION & DEMENTIA. CONTINUE TO TAPER DOWN ON USE WITH GOAL TO STOP THIS MEDICATION. Strength: 0.5 mg, Disp: 40 tablet, Rfl: 0 ?   azaTHIOprine (IMURAN) 50 MG tablet, Take 50 mg by mouth 2 (two) times daily., Disp: , Rfl:  ?  buPROPion (WELLBUTRIN XL) 150 MG 24 hr tablet, TAKE 1 TABLET EVERY MORNING FOR MOOD, FOCUS & CONCENTRATION, Disp: 90 tablet, Rfl: 1 ?  Cholecalciferol (VITAMIN D) 2000 UNITS CAPS, Take 1 capsule by mouth daily., Disp: , Rfl:  ?  escitalopram (LEXAPRO) 20 MG tablet, TAKE 1.5 TABLETS (30 MG) DAILY FOR MOOD & CHRONIC ANXIETY, Disp: 135 tablet, Rfl: 1 ?  gabapentin (NEURONTIN) 300 MG capsule, TAKE 1-2 CAPS AT NIGHT AS NEEDED FOR ANXIETY, PAIN AND SLEEP., Disp: 180 capsule, Rfl: 3 ?  Magnesium 500 MG TABS, Take 500 mg by mouth daily. , Disp: , Rfl:  ?  rosuvastatin (CRESTOR) 5 MG tablet, Take 1 tab daily for cholesterol to reduce risk of heart attack/stroke., Disp: 90 tablet, Rfl: 3  ? ?Objective ?Vitals:  ? 09/03/21 1357  ?BP: 132/86  ?Pulse: 74  ? ? ?Gen: NAD ?CV: S1 S2 RRR ?Lungs: Clear to auscultation bilaterally ?Abd: soft, nontender ? ? ?Previous Pelvic Exam showed: ?POP-Q ?  ?-3  ?                                          Aa   ?-3 ?                                          Ba   ?-8.5  ?                                            C  ?  ?3  ?                                          Gh   ?4  ?                                          Pb   ?9  ?                                          tvl  ?  ?-1.5  ?  Ap   ?-1.5  ?                                          Bp   ?   ?                                            D  ?  ? ? ? ? ?Assessment/ Plan ? ?Assessment: ?The patient is a 66 y.o. year old scheduled to undergo Exam under anesthesia, posterior repair, pelvic floor trigger point injections with botox. Verbal consent was obtained for these procedures. ? ?Plan: ?General Surgical Consent: ?The patient has previously been counseled on alternative treatments, and the decision by the patient and provider was to proceed with the procedure listed above. ? ?For all procedures, there are risks  of bleeding, infection, damage to surrounding organs including but not limited to bowel, bladder, blood vessels, ureters and nerves, and need for further surgery if an injury were to occur. These risks are all low with minimally invasive surgery.  ? ?There are risks of numbness and weakness at any body site or buttock/rectal pain.  It is possible that baseline pain can be worsened by surgery, either with or without mesh. If surgery is vaginal, there is also a low risk of possible conversion to laparoscopy or open abdominal incision where indicated. Very rare risks include blood transfusion, blood clot, heart attack, pneumonia, or death.  ? ?There is also a risk of short-term postoperative urinary retention with need to use a catheter. About half of patients need to go home from surgery with a catheter, which is then later removed in the office. The risk of long-term need for a catheter is very low. There is also a risk of worsening of overactive bladder.  ? ? Prolapse (with or without mesh): ?Risk factors for surgical failure  include things that put pressure on your pelvis and the surgical repair, including obesity, chronic cough, and heavy lifting or straining (including lifting children or adults, straining on the toilet, or lifting heavy objects such as furniture or anything weighing >25 lbs. Risks of recurrence is 20-30% with vaginal native tissue repair and a less than 10% with sacrocolpopexy with mesh.   ?We discussed consent for blood products. Risks for blood transfusion include allergic reactions, other reactions that can affect different body organs and managed accordingly, transmission of infectious diseases such as HIV or Hepatitis. However, the blood is screened. Patient consents for blood products. ? ?Pre-operative instructions:  No labs needed ? ?Cathter use: Patient will go home with foley if needed after post-operative voiding trial. ? ?Post-operative instructions:  She was provided with specific  post-operative instructions, including precautions and signs/symptoms for which we would recommend contacting us, in addition to daytime and after-hours contact phone numbers. This was provided on a handout.  ?

## 2021-09-04 ENCOUNTER — Other Ambulatory Visit: Payer: Self-pay | Admitting: Obstetrics and Gynecology

## 2021-09-04 ENCOUNTER — Other Ambulatory Visit: Payer: Self-pay | Admitting: Nurse Practitioner

## 2021-09-04 ENCOUNTER — Encounter: Payer: Self-pay | Admitting: Obstetrics and Gynecology

## 2021-09-06 ENCOUNTER — Encounter (INDEPENDENT_AMBULATORY_CARE_PROVIDER_SITE_OTHER): Payer: Self-pay

## 2021-09-08 ENCOUNTER — Other Ambulatory Visit: Payer: Self-pay

## 2021-09-08 ENCOUNTER — Encounter (HOSPITAL_BASED_OUTPATIENT_CLINIC_OR_DEPARTMENT_OTHER): Payer: Self-pay | Admitting: Obstetrics and Gynecology

## 2021-09-08 NOTE — Progress Notes (Signed)
Spoke w/ via phone for pre-op interview: patient ?Lab needs dos: none ?Lab results: CMP and CBCD 09/02/21; EKG 03/18/21; Echo 10/10/2017 EF 50-55% ?COVID test: patient states asymptomatic no test needed. ?Arrive at 1045 09/10/21 ?NPO after MN except clear liquids.Clear liquids from MN until 0945 ?Med rec completed. ?Medications to take morning of surgery: Lexapro, Wellbutrin, and Gemtesa; Xanax PRN ?Diabetic medication: NA ?Patient instructed no nail polish to be worn day of surgery. ?Patient instructed to bring photo id and insurance card day of surgery. ?Patient aware to have Driver (ride ) / caregiver for 24 hours after surgery. (Son, Darryl, to drive) ?Patient Special Instructions: NA ?Pre-Op special Istructions: NA ?Patient verbalized understanding of instructions that were given at this phone interview. ?Patient denies shortness of breath, chest pain, fever, cough at this phone interview.  ?

## 2021-09-08 NOTE — Progress Notes (Signed)
Submitted PA for Oxycodone '5mg'$  15 tab on Cover my Meds. ?Key: [id number] ?PA Case ID: [id number] ?Rx- [id number] ?Outcome: PENDING  ?

## 2021-09-09 ENCOUNTER — Encounter: Payer: Self-pay | Admitting: Obstetrics and Gynecology

## 2021-09-09 NOTE — Progress Notes (Signed)
Submitted PA for Oxycodone '5mg'$  on Cover my Meds. ?PA Case ID: [id number]  ? ?Status: Approved  ?prior Auth: Coverage Start Date:09/08/2021; Coverage End Date:10/08/2021  ?

## 2021-09-10 ENCOUNTER — Other Ambulatory Visit: Payer: Self-pay

## 2021-09-10 ENCOUNTER — Encounter (HOSPITAL_BASED_OUTPATIENT_CLINIC_OR_DEPARTMENT_OTHER)
Admission: RE | Disposition: A | Discharge status: Home / Self Care | Payer: Self-pay | Source: Home / Self Care | Attending: Obstetrics and Gynecology

## 2021-09-10 ENCOUNTER — Ambulatory Visit (HOSPITAL_BASED_OUTPATIENT_CLINIC_OR_DEPARTMENT_OTHER): Payer: BC Managed Care – PPO | Admitting: Anesthesiology

## 2021-09-10 ENCOUNTER — Ambulatory Visit (HOSPITAL_BASED_OUTPATIENT_CLINIC_OR_DEPARTMENT_OTHER)
Admission: RE | Admit: 2021-09-10 | Discharge: 2021-09-10 | Disposition: A | Discharge status: Home / Self Care | Payer: BC Managed Care – PPO | Attending: Obstetrics and Gynecology | Admitting: Obstetrics and Gynecology

## 2021-09-10 ENCOUNTER — Encounter (HOSPITAL_BASED_OUTPATIENT_CLINIC_OR_DEPARTMENT_OTHER): Payer: Self-pay | Admitting: Obstetrics and Gynecology

## 2021-09-10 DIAGNOSIS — N816 Rectocele: Secondary | ICD-10-CM

## 2021-09-10 DIAGNOSIS — M62838 Other muscle spasm: Secondary | ICD-10-CM | POA: Insufficient documentation

## 2021-09-10 DIAGNOSIS — E669 Obesity, unspecified: Secondary | ICD-10-CM | POA: Diagnosis not present

## 2021-09-10 DIAGNOSIS — N842 Polyp of vagina: Secondary | ICD-10-CM | POA: Insufficient documentation

## 2021-09-10 DIAGNOSIS — G4730 Sleep apnea, unspecified: Secondary | ICD-10-CM | POA: Insufficient documentation

## 2021-09-10 HISTORY — PX: RECTOCELE REPAIR: SHX761

## 2021-09-10 SURGERY — COLPORRHAPHY, POSTERIOR, FOR RECTOCELE REPAIR
Anesthesia: General

## 2021-09-10 MED ORDER — PHENYLEPHRINE 80 MCG/ML (10ML) SYRINGE FOR IV PUSH (FOR BLOOD PRESSURE SUPPORT)
PREFILLED_SYRINGE | INTRAVENOUS | Status: AC
  Filled 2021-09-10: qty 10

## 2021-09-10 MED ORDER — DEXAMETHASONE SODIUM PHOSPHATE 10 MG/ML IJ SOLN
INTRAMUSCULAR | Status: DC | PRN
  Administered 2021-09-10: 5 mg via INTRAVENOUS

## 2021-09-10 MED ORDER — SODIUM CHLORIDE (PF) 0.9 % IJ SOLN
INTRAMUSCULAR | Status: DC | PRN
  Administered 2021-09-10: 5 mL

## 2021-09-10 MED ORDER — EPHEDRINE SULFATE-NACL 50-0.9 MG/10ML-% IV SOSY
PREFILLED_SYRINGE | INTRAVENOUS | Status: DC | PRN
  Administered 2021-09-10: 10 mg via INTRAVENOUS

## 2021-09-10 MED ORDER — LIDOCAINE 2% (20 MG/ML) 5 ML SYRINGE
INTRAMUSCULAR | Status: DC | PRN
Start: 2021-09-10 — End: 2021-09-10
  Administered 2021-09-10: 60 mg via INTRAVENOUS

## 2021-09-10 MED ORDER — EPHEDRINE 5 MG/ML INJ
INTRAVENOUS | Status: AC
  Filled 2021-09-10: qty 5

## 2021-09-10 MED ORDER — 0.9 % SODIUM CHLORIDE (POUR BTL) OPTIME
TOPICAL | Status: DC | PRN
  Administered 2021-09-10: 500 mL

## 2021-09-10 MED ORDER — ONDANSETRON HCL 4 MG/2ML IJ SOLN
INTRAMUSCULAR | Status: DC | PRN
  Administered 2021-09-10: 4 mg via INTRAVENOUS

## 2021-09-10 MED ORDER — MIDAZOLAM HCL 5 MG/5ML IJ SOLN
INTRAMUSCULAR | Status: DC | PRN
  Administered 2021-09-10: 2 mg via INTRAVENOUS

## 2021-09-10 MED ORDER — HEPARIN SODIUM (PORCINE) 5000 UNIT/ML IJ SOLN
INTRAMUSCULAR | Status: DC | PRN
  Administered 2021-09-10: 5000 [IU] via SUBCUTANEOUS

## 2021-09-10 MED ORDER — ONABOTULINUMTOXINA 100 UNITS IJ SOLR
INTRAMUSCULAR | Status: DC | PRN
  Administered 2021-09-10: 100 [IU] via INTRAMUSCULAR

## 2021-09-10 MED ORDER — CEFAZOLIN SODIUM-DEXTROSE 2-4 GM/100ML-% IV SOLN
2.0000 g | INTRAVENOUS | Status: AC
  Administered 2021-09-10: 2 g via INTRAVENOUS

## 2021-09-10 MED ORDER — LIDOCAINE-EPINEPHRINE 1 %-1:100000 IJ SOLN
INTRAMUSCULAR | Status: DC | PRN
  Administered 2021-09-10: 4 mL

## 2021-09-10 MED ORDER — PROPOFOL 10 MG/ML IV BOLUS
INTRAVENOUS | Status: AC
  Filled 2021-09-10: qty 20

## 2021-09-10 MED ORDER — DEXAMETHASONE SODIUM PHOSPHATE 10 MG/ML IJ SOLN
INTRAMUSCULAR | Status: AC
  Filled 2021-09-10: qty 1

## 2021-09-10 MED ORDER — AMISULPRIDE (ANTIEMETIC) 5 MG/2ML IV SOLN: 10.0000 mg | Freq: Once | INTRAVENOUS | Status: DC | PRN

## 2021-09-10 MED ORDER — PROPOFOL 10 MG/ML IV BOLUS
INTRAVENOUS | Status: DC | PRN
  Administered 2021-09-10: 200 mg via INTRAVENOUS

## 2021-09-10 MED ORDER — ONDANSETRON HCL 4 MG/2ML IJ SOLN
INTRAMUSCULAR | Status: AC
  Filled 2021-09-10: qty 2

## 2021-09-10 MED ORDER — LACTATED RINGERS IV SOLN: INTRAVENOUS | Status: DC

## 2021-09-10 MED ORDER — ONDANSETRON HCL 4 MG/2ML IJ SOLN: 4.0000 mg | Freq: Once | INTRAMUSCULAR | Status: DC | PRN

## 2021-09-10 MED ORDER — ACETAMINOPHEN 500 MG PO TABS
ORAL_TABLET | ORAL | Status: AC
  Filled 2021-09-10: qty 2

## 2021-09-10 MED ORDER — FENTANYL CITRATE (PF) 100 MCG/2ML IJ SOLN
INTRAMUSCULAR | Status: DC | PRN
  Administered 2021-09-10: 50 ug via INTRAVENOUS

## 2021-09-10 MED ORDER — LIDOCAINE HCL (PF) 2 % IJ SOLN
INTRAMUSCULAR | Status: AC
  Filled 2021-09-10: qty 5

## 2021-09-10 MED ORDER — FENTANYL CITRATE (PF) 100 MCG/2ML IJ SOLN
INTRAMUSCULAR | Status: AC
  Filled 2021-09-10: qty 2

## 2021-09-10 MED ORDER — FENTANYL CITRATE (PF) 100 MCG/2ML IJ SOLN: 25.0000 ug | INTRAMUSCULAR | Status: DC | PRN

## 2021-09-10 MED ORDER — KETOROLAC TROMETHAMINE 15 MG/ML IJ SOLN: 15.0000 mg | Freq: Once | INTRAMUSCULAR | Status: DC | PRN

## 2021-09-10 MED ORDER — MIDAZOLAM HCL 2 MG/2ML IJ SOLN
INTRAMUSCULAR | Status: AC
  Filled 2021-09-10: qty 2

## 2021-09-10 MED ORDER — ACETAMINOPHEN 500 MG PO TABS
1000.0000 mg | ORAL_TABLET | Freq: Once | ORAL | Status: AC
Start: 2021-09-10 — End: 2021-09-10
  Administered 2021-09-10: 1000 mg via ORAL

## 2021-09-10 MED ORDER — HEPARIN SODIUM (PORCINE) 5000 UNIT/ML IJ SOLN
INTRAMUSCULAR | Status: AC
  Filled 2021-09-10: qty 1

## 2021-09-10 MED ORDER — WHITE PETROLATUM EX OINT
TOPICAL_OINTMENT | CUTANEOUS | Status: AC
  Filled 2021-09-10: qty 5

## 2021-09-10 MED ORDER — PHENYLEPHRINE 80 MCG/ML (10ML) SYRINGE FOR IV PUSH (FOR BLOOD PRESSURE SUPPORT)
PREFILLED_SYRINGE | INTRAVENOUS | Status: DC | PRN
  Administered 2021-09-10: 80 ug via INTRAVENOUS
  Administered 2021-09-10 (×2): 160 ug via INTRAVENOUS

## 2021-09-10 MED ORDER — CEFAZOLIN SODIUM-DEXTROSE 2-4 GM/100ML-% IV SOLN
INTRAVENOUS | Status: AC
  Filled 2021-09-10: qty 100

## 2021-09-10 SURGICAL SUPPLY — 40 items
AGENT HMST KT MTR STRL THRMB (HEMOSTASIS)
BLADE CLIPPER SENSICLIP SURGIC (BLADE) ×2 IMPLANT
BLADE SURG 15 STRL LF DISP TIS (BLADE) ×1 IMPLANT
BLADE SURG 15 STRL SS (BLADE) ×2
DECANTER SPIKE VIAL GLASS SM (MISCELLANEOUS) IMPLANT
DEVICE CAPIO SLIM SINGLE (INSTRUMENTS) IMPLANT
ELECT REM PT RETURN 9FT ADLT (ELECTROSURGICAL)
ELECTRODE REM PT RTRN 9FT ADLT (ELECTROSURGICAL) IMPLANT
GAUZE 4X4 16PLY ~~LOC~~+RFID DBL (SPONGE) ×2 IMPLANT
GLOVE BIO SURGEON STRL SZ 6 (GLOVE) ×2 IMPLANT
GLOVE BIOGEL PI IND STRL 6.5 (GLOVE) ×1 IMPLANT
GLOVE BIOGEL PI IND STRL 7.0 (GLOVE) ×1 IMPLANT
GLOVE BIOGEL PI INDICATOR 6.5 (GLOVE) ×1
GLOVE BIOGEL PI INDICATOR 7.0 (GLOVE) ×1
GLOVE SURG ENC MOIS LTX SZ6 (GLOVE) ×2 IMPLANT
GLOVE SURG UNDER POLY LF SZ6.5 (GLOVE) ×2 IMPLANT
GOWN STRL REUS W/TWL LRG LVL3 (GOWN DISPOSABLE) ×2 IMPLANT
HIBICLENS CHG 4% 4OZ BTL (MISCELLANEOUS) ×2 IMPLANT
HOLDER FOLEY CATH W/STRAP (MISCELLANEOUS) ×2 IMPLANT
KIT TURNOVER CYSTO (KITS) ×2 IMPLANT
MANIFOLD NEPTUNE II (INSTRUMENTS) ×2 IMPLANT
NDL MAYO 6 CRC TAPER PT (NEEDLE) IMPLANT
NEEDLE HYPO 22GX1.5 SAFETY (NEEDLE) ×2 IMPLANT
NEEDLE MAYO 6 CRC TAPER PT (NEEDLE) IMPLANT
NS IRRIG 1000ML POUR BTL (IV SOLUTION) ×2 IMPLANT
PACK CYSTO (CUSTOM PROCEDURE TRAY) ×2 IMPLANT
PACK VAGINAL WOMENS (CUSTOM PROCEDURE TRAY) ×2 IMPLANT
RETRACTOR LONE STAR DISPOSABLE (INSTRUMENTS) ×2 IMPLANT
RETRACTOR STAY HOOK 5MM (MISCELLANEOUS) ×2 IMPLANT
SET IRRIG Y TYPE TUR BLADDER L (SET/KITS/TRAYS/PACK) IMPLANT
SURGIFLO W/THROMBIN 8M KIT (HEMOSTASIS) IMPLANT
SUT ABS MONO DBL WITH NDL 48IN (SUTURE) IMPLANT
SUT VIC AB 0 CT1 27 (SUTURE)
SUT VIC AB 0 CT1 27XBRD ANTBC (SUTURE) IMPLANT
SUT VIC AB 2-0 SH 27 (SUTURE)
SUT VIC AB 2-0 SH 27XBRD (SUTURE) IMPLANT
SUT VICRYL 2-0 SH 8X27 (SUTURE) ×2 IMPLANT
SYR BULB EAR ULCER 3OZ GRN STR (SYRINGE) ×2 IMPLANT
TOWEL OR 17X26 10 PK STRL BLUE (TOWEL DISPOSABLE) ×2 IMPLANT
TRAY FOLEY W/BAG SLVR 14FR LF (SET/KITS/TRAYS/PACK) ×2 IMPLANT

## 2021-09-10 NOTE — Discharge Instructions (Addendum)
POST OPERATIVE INSTRUCTIONS ? ?General Instructions ?Recovery (not bed rest) will last approximately 6 weeks ?Walking is encouraged, but refrain from strenuous exercise/ housework/ heavy lifting. ?No lifting >10lbs  ?Nothing in the vagina- NO intercourse, tampons or douching ?Bathing:  Do not submerge in water (NO swimming, bath, hot tub, etc) until after your postop visit. You can shower starting the day after surgery.  ?No driving until you are not taking narcotic pain medicine and until your pain is well enough controlled that you can slam on the breaks or make sudden movements if needed.  ? ?Taking your medications ?Please take your acetaminophen and ibuprofen on a schedule for the first 48 hours. Take 600mg ibuprofen, then take 500mg acetaminophen 3 hours later, then continue to alternate ibuprofen and acetaminophen. That way you are taking each type of medication every 6 hours. ?Take the prescribed narcotic (oxycodone, tramadol, etc) as needed, with a maximum being every 4 hours.  ?Take a stool softener daily to keep your stools soft and preventing you from straining. If you have diarrhea, you decrease your stool softener. This is explained more below. We have prescribed you Miralax. ? ?Reasons to Call the Nurse (see last page for phone numbers) ?Heavy Bleeding (changing your pad every 1-2 hours) ?Persistent nausea/vomiting ?Fever (100.4 degrees or more) ?Incision problems (pus or other fluid coming out, redness, warmth, increased pain) ? ?Things to Expect After Surgery ?Mild to Moderate pain is normal during the first day or two after surgery. If prescribed, take Ibuprofen or Tylenol first and use the stronger medicine for ?break-through? pain. You can overlap these medicines because they work differently.  ? ?Constipation  ? ?To Prevent Constipation:  Eat a well-balanced diet including protein, grains, fresh fruit and vegetables.  Drink plenty of fluids. Walk regularly.  Depending on specific instructions  from your physician: take Miralax daily and additionally you can add a stool softener (colace/ docusate) and fiber supplement. Continue as long as you're on pain medications.  ? ?To Treat Constipation:  If you do not have a bowel movement in 2 days after surgery, you can take 2 Tbs of Milk of Magnesia 1-2 times a day until you have a bowel movement. If diarrhea occurs, decrease the amount or stop the laxative. If no results with Milk of Magnesia, you can drink a bottle of magnesium citrate which you can purchase over the counter. ? ?Fatigue:  This is a normal response to surgery and will improve with time.  Plan frequent rest periods throughout the day. ? ?Gas Pain:  This is very common but can also be very painful! Drink warm liquids such as herbal teas, bouillon or soup. Walking will help you pass more gas.  Mylicon or Gas-X can be taken over the counter. ? ?Leaking Urine:  Varying amounts of leakage may occur after surgery.  This should improve with time. Your bladder needs at least 3 months to recover from surgery. If you leak after surgery, be sure to mention this to your doctor at your post-op visit. If you were taking medications for overactive bladder prior to surgery, be sure to restart the medications immediately after surgery. ? ?Incisions: If you have incisions on your abdomen, the skin glue will dissolve on its own over time. It is ok to gently rinse with soap and water over these incisions but do not scrub. ? ?Catheter ?Approximately 50% of patients are unable to urinate after surgery and need to go home with a catheter. This allows your bladder to   rest so it can return to full function. If you go home with a catheter, the office will call to set up a voiding trial a few days after surgery. For most patients, by this visit, they are able to urinate on their own. Long term catheter use is rare.   Return to Work  As work demands and recovery times vary widely, it is hard to predict when you will want  to return to work. If you have a desk job with no strenuous physical activity, and if you would like to return sooner than generally recommended, discuss this with your provider or call our office.   Post op concerns  For non-emergent issues, please call the Urogynecology Nurse. Please leave a message and someone will contact you within one business day.  You can also send a message through MyChart.   AFTER HOURS (After 5:00 PM and on weekends):  For urgent matters that cannot wait until the next business day. Call our office [phone] and connect to the doctor on call.  Please reserve this for important issues.   **FOR ANY TRUE EMERGENCY ISSUES CALL 911 OR GO TO THE NEAREST EMERGENCY ROOM.** Please inform our office or the doctor on call of any emergency.     APPOINTMENTS: Call [phone]    Post Anesthesia Home Care Instructions  Activity: Get plenty of rest for the remainder of the day. A responsible individual must stay with you for 24 hours following the procedure.  For the next 24 hours, DO NOT: -Drive a car -Operate machinery -Drink alcoholic beverages -Take any medication unless instructed by your physician -Make any legal decisions or sign important papers.  Meals: Start with liquid foods such as gelatin or soup. Progress to regular foods as tolerated. Avoid greasy, spicy, heavy foods. If nausea and/or vomiting occur, drink only clear liquids until the nausea and/or vomiting subsides. Call your physician if vomiting continues.  Special Instructions/Symptoms: Your throat may feel dry or sore from the anesthesia or the breathing tube placed in your throat during surgery. If this causes discomfort, gargle with warm salt water. The discomfort should disappear within 24 hours.      

## 2021-09-10 NOTE — Transfer of Care (Addendum)
Immediate Anesthesia Transfer of Care Note ? ?Patient: Beth [name] ? ?Procedure(s) Performed: Procedure(s) (LRB): ?POSTERIOR REPAIR (RECTOCELE) (N/A) ? ?Patient Location: PACU ? ?Anesthesia Type: General ? ?Level of Consciousness: awake, alert  and oriented ? ?Airway & Oxygen Therapy: Patient Spontanous Breathing  ? ?Post-op Assessment: Report given to PACU RN and Post -op Vital signs reviewed and stable ? ?Post vital signs: Reviewed and stable ? ?Complications: No apparent anesthesia complicationsI ? ?Last Vitals:  ?Vitals Value Taken Time  ?BP 142/77 09/10/21 1300  ?Temp 37.1 ?C 09/10/21 1300  ?Pulse 79 09/10/21 1303  ?Resp 13 09/10/21 1303  ?SpO2 100 % 09/10/21 1303  ?Vitals shown include unvalidated device data. ? ?Last Pain:  ?Vitals:  ? 09/10/21 1010  ?TempSrc: Oral  ?   ? ?  ? ?Complications: No notable events documented. ?

## 2021-09-10 NOTE — Anesthesia Procedure Notes (Signed)
Procedure Name: LMA Insertion ?Date/Time: 09/10/2021 12:05 PM ?Performed by: Schrupp Blocker, CRNA ?Pre-anesthesia Checklist: Patient identified, Emergency Drugs available, Suction available and Patient being monitored ?Patient Re-evaluated:Patient Re-evaluated prior to induction ?Oxygen Delivery Method: Circle System Utilized ?Preoxygenation: Pre-oxygenation with 100% oxygen ?Induction Type: IV induction ?Ventilation: Mask ventilation without difficulty ?LMA: LMA inserted ?LMA Size: 4.0 ?Number of attempts: 1 ?Airway Equipment and Method: Bite block ?Placement Confirmation: positive ETCO2 ?Tube secured with: Tape ?Dental Injury: Teeth and Oropharynx as per pre-operative assessment  ? ? ? ? ?

## 2021-09-10 NOTE — Anesthesia Preprocedure Evaluation (Addendum)
Anesthesia Evaluation  ?Patient identified by MRN, date of birth, ID band ?Patient awake ? ? ? ?Reviewed: ?Allergy & Precautions, NPO status , Patient's Chart, lab work & pertinent test results ? ?Airway ?Mallampati: II ? ?TM Distance: >3 FB ?Neck ROM: Full ? ? ? Dental ? ?(+) Upper Dentures ?  ?Pulmonary ?sleep apnea and Continuous Positive Airway Pressure Ventilation , PE ?  ?Pulmonary exam normal ? ? ? ? ? ? ? Cardiovascular ?negative cardio ROS ?Normal cardiovascular exam ? ?ECG: NSR, rate 70 ?  ?Neuro/Psych ?PSYCHIATRIC DISORDERS Anxiety Depression negative neurological ROS ?   ? GI/Hepatic ?negative GI ROS, Neg liver ROS,   ?Endo/Other  ?negative endocrine ROS ? Renal/GU ?Renal InsufficiencyRenal disease  ? ?  ?Musculoskeletal ? ?(+) Arthritis ,  ? Abdominal ?(+) + obese,   ?Peds ? Hematology ?negative hematology ROS ?(+)   ?Anesthesia Other Findings ?posterior prolapse; other muscle spasm ? Reproductive/Obstetrics ? ?  ? ? ? ? ? ? ? ? ? ? ? ? ? ?  ?  ? ? ? ? ? ? ? ?Anesthesia Physical ?Anesthesia Plan ? ?ASA: 2 ? ?Anesthesia Plan: General  ? ?Post-op Pain Management:   ? ?Induction: Intravenous ? ?PONV Risk Score and Plan: 3 and Ondansetron, Dexamethasone, Midazolam and Treatment may vary due to age or medical condition ? ?Airway Management Planned:  ? ?Additional Equipment:  ? ?Intra-op Plan:  ? ?Post-operative Plan: Extubation in OR ? ?Informed Consent: I have reviewed the patients History and Physical, chart, labs and discussed the procedure including the risks, benefits and alternatives for the proposed anesthesia with the patient or authorized representative who has indicated his/her understanding and acceptance.  ? ? ? ?Dental advisory given ? ?Plan Discussed with: CRNA ? ?Anesthesia Plan Comments:   ? ? ? ? ? ? ?Anesthesia Quick Evaluation ? ?

## 2021-09-10 NOTE — H&P (Signed)
[facility] ?Pre-Operative H&P ? ?Subjective ?Chief Complaint: Beth [name] presents for a preoperative encounter.  ? ?History of Present Illness: ?Beth [name] is a 66 y.o. female who presents for preoperative visit.  She is scheduled to undergo Exam under anesthesia, posterior repair, pelvic floor trigger point injections with botox on 09/10/21.  Her symptoms include vaginal pressure, and she was was found to have Stage I anterior, Stage I posterior, Stage II apical prolapse. ? ? ?Past Medical History:  ?Diagnosis Date  ? Anxiety   ? Chronic constipation   ? followed by dr Carlean Purl  ? CKD (chronic kidney disease), stage III ([place])   ? Enterocele   ? History of cervical cancer   ? hx squamous cell cervical carcinoma,   s/p  radical hysterectomy 11-24-1999  ? History of MRSA infection   ? 2005--- buttock abscess  ? History of pulmonary embolus (PE)   ? post op 10-09-2017 (surgery on 09-19-2017)  lower range pulmonary artery,  resolved and completed 6 month Eliquis  ? History of renal cell carcinoma   ? s/p  left nephrectomy 09-19-2017  (oncologist -- dr Alen Blew / urologsit-- dr bell)  ? History of seizure   ? 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 Incisional hernia   ? MDD (major depressive disorder)   ? OA (osteoarthritis)   ? OSA on CPAP   ? followed by dr dohmeier  ? Pre-diabetes   ? Rectocele   ? RLS (restless legs syndrome) 01/24/2015  ? Per sleep study 05/2014   ? Solitary right kidney   ? acquired 09-19-2017 s/p left nephrectomy  ? SUI (stress urinary incontinence, female)   ? 05-21-2019  improved with therapy  ? Systemic lupus erythematosus ([place])   ? rheumotologist--- dr Amil Amen (lov 03-19-2019 in epic)  ? Systemic lupus erythematosus ([place])   ? Vitamin D deficiency   ? Wears glasses   ?  ? ?Past Surgical History:  ?Procedure Laterality Date  ?  CESAREAN SECTION  x3   last one 1980  ? COLD KNIFE CERVICAL CONIZATION   10/07/1999  '@[facility]'$   ? COLONOSCOPY    ? negative per pt, Dr. Alice Reichert - [facility]  ? EXPLORATORY LAPAROTOMY MODIFIED RADICAL HYSTERECTOMY WITH PELVIC LYMPHADENECTOMY AND RESECTION MECKEL'S DIVERTICULUM  11-24-1999 dr Aldean Ast  '@[facility]'$   ? [place] N/A 05/23/2019  ? Procedure: LAPAROSCOPIC INCISIONAL HERNIA REPAIR WITH MESH;  Surgeon: Kinsinger, Arta Bruce, MD;  Location: [facility];  Service: General;  Laterality: N/A;  ? LAPAROSCOPIC NEPHRECTOMY, HAND ASSISTED Left 09/19/2017  ? Procedure: LEFT HAND ASSISTED LAPAROSCOPIC NEPHRECTOMY;  Surgeon: Lucas Mallow, MD;  Location: [facility];  Service: Urology;  Laterality: Left;  ? ? ?is allergic to bactrim [sulfamethoxazole-trimethoprim], cymbalta [duloxetine hcl], and acyclovir and related.  ? ?Family History  ?Problem Relation Age of Onset  ? Hyperlipidemia Mother   ? Hypertension Mother   ? Heart disease Mother   ? ? ?Social History  ? ?Tobacco Use  ? Smoking status: Never  ? Smokeless tobacco: Never  ?Vaping Use  ? Vaping Use: Never used  ?Substance Use Topics  ? Alcohol use: No  ? Drug use: Never  ? ? ? ?Review of Systems was negative for a full 10 system review except as noted in the History of Present Illness. ? ? ?Current Facility-Administered Medications:  ?  ceFAZolin (ANCEF) IVPB 2g/100 mL premix, 2 g, Intravenous, On Call to OR, [name], MD ?  lactated ringers infusion, , Intravenous, Continuous, Myrtie Soman, MD, Last Rate: 10 mL/hr at 09/10/21 1049, New Bag at 09/10/21 1049  ? ?Objective ?Vitals:  ? 09/10/21 1010  ?BP: (!) 143/80  ?Pulse: 78  ?Resp: 17  ?Temp: 98 ?F (36.7 ?C)  ?SpO2: 97%  ? ? ?Gen: NAD ?CV: S1 S2 RRR ?Lungs: Clear to auscultation bilaterally ?Abd: soft, nontender ? ? ?Previous Pelvic Exam showed: ?POP-Q ?  ?-3  ?                                          Aa   ?-3 ?                                          Ba   ?-8.5  ?                                             C  ?  ?3  ?                                          Gh   ?4  ?                                          Pb   ?9  ?                                          tvl  ?  ?-1.5  ?                                          Ap   ?-1.5  ?                                          Bp   ?   ?                                            D  ?  ? ? ? ? ?Assessment/ Plan ? ?The patient is a 66 y.o. year old scheduled to undergo Exam under anesthesia, posterior repair, pelvic floor trigger point injections with botox.  ? ?[name], MD ? ? ? ?

## 2021-09-10 NOTE — Op Note (Signed)
Operative Note ? ?Preoperative Diagnosis: posterior vaginal prolapse and pelvic floor muscle spasm ? ?Postoperative Diagnosis: same ? ?Procedures performed:  ?Posterior repair, pelvic floor trigger point injection with botox ? ?Attending Surgeon: [name], MD ? ?Anesthesia: General LMA ? ?Findings: 1. 0.5cm vaginal polyp on posterior left vaginal wall ? 2. Stage II posterior vaginal prolapse ?  ? ?Specimens:  ?ID Type Source Tests Collected by Time Destination  ?1 : VAGINAL POLYP Tissue Soft Tissue, Other SURGICAL PATHOLOGY [name], MD 09/10/2021 1223   ? ? ?Estimated blood loss: 50 mL ? ?IV fluids: 100 mL ? ?Urine output: 300 mL ? ?Complications: none ? ?Procedure in Detail: ?After informed consent was obtained, the patient was taken to the operating room where general anesthesia was induced. She was placed in dorsal lithotomy position, taking care to avoid any traction of the extremities and prepped and draped in the usual sterile fashion. A self-retaining lonestar retractor was placed using four elastic blue stays.  After a foley catheter was inserted into the urethra. ?  ?A 0.5cm vaginal wall polyp was noted on the posterior vaginal wall and removed with metzenbaums and sent to pathology.  Two Allis clamps were placed in the midline of the posterior vaginal wall defect.  1% lidocaine with epinephrine was injected into the vaginal mucosa. A vertical incision was made between these clamps with a 15 blade scalpel.  The rectovaginal septum was then dissected off the vaginal mucosa bilaterally.  No enterocele was noted.  The rectovaginal septum was then reapproximated with plicating sutures of 2-0 Vicryl.  After placement of the first plication stitch two fingers were inserted into the vaginal to confirm adequate caliber.  The last distal stitch incorporated the perineal body in a U stitch fashion.  After plication, the excess vaginal mucosa was trimmed and the vaginal mucosa was reapproximated  using 2-0 Vicryl sutures in a running fashion.  Good hemostasis was noted.  Vaginal packing was not placed.   ? ?100 units of botox was reconstituted in 25m of injectable saline. Digital palpation of the levator and obturator internus muscles was performed and the botox was injected along these muscles at the location of trigger points bilaterally, with approximately 4 injections on each side. Hemostasis was again noted. ? ?A rectal examination was normal and confirmed no sutures within the rectum.  The patient tolerated the procedure well.  She was awakened from anesthesia and transferred to the recovery room in stable condition. All counts were correct x 2.   ? ?MJaquita Folds MD ? ? ?

## 2021-09-11 ENCOUNTER — Telehealth: Payer: Self-pay | Admitting: Obstetrics and Gynecology

## 2021-09-11 NOTE — Telephone Encounter (Signed)
Beth [name] underwent posterior repair, vaginal trigger point injections with botox on 09/11/21.  ? ?She did not have a voiding trial.  ? ?She was discharged without a catheter. Please call her for a routine post op check. ? ?[name], MD ? ?

## 2021-09-11 NOTE — Anesthesia Postprocedure Evaluation (Signed)
Anesthesia Post Note ? ?Patient: Beth [name] ? ?Procedure(s) Performed: POSTERIOR REPAIR (RECTOCELE) ? ?  ? ?Patient location during evaluation: PACU ?Anesthesia Type: General ?Level of consciousness: awake ?Pain management: pain level controlled ?Vital Signs Assessment: post-procedure vital signs reviewed and stable ?Respiratory status: spontaneous breathing, nonlabored ventilation, respiratory function stable and patient connected to nasal cannula oxygen ?Cardiovascular status: blood pressure returned to baseline and stable ?Postop Assessment: no apparent nausea or vomiting ?Anesthetic complications: no ? ? ?No notable events documented. ? ?Last Vitals:  ?Vitals:  ? 09/10/21 1330 09/10/21 1509  ?BP: 112/61 (!) 166/81  ?Pulse: 74 87  ?Resp: 18 15  ?Temp: 36.5 ?C 36.6 ?C  ?SpO2: 95% 96%  ?  ?Last Pain:  ?Vitals:  ? 09/10/21 1509  ?TempSrc:   ?PainSc: 0-No pain  ? ? ?  ?  ?  ?  ?  ?  ? ?Mickle Campton P Corwyn Vora ? ? ? ? ?

## 2021-09-14 ENCOUNTER — Encounter (HOSPITAL_BASED_OUTPATIENT_CLINIC_OR_DEPARTMENT_OTHER): Payer: Self-pay | Admitting: Obstetrics and Gynecology

## 2021-09-14 LAB — SURGICAL PATHOLOGY

## 2021-09-14 NOTE — Telephone Encounter (Signed)
Attempt made to contact Beth [name] is a 66 y.o. female re: Post op call ?Pt was not available.  ?LM on the VM for the patient to call me back.  ?

## 2021-09-15 ENCOUNTER — Ambulatory Visit: Payer: BC Managed Care – PPO | Admitting: Family Medicine

## 2021-09-16 ENCOUNTER — Ambulatory Visit: Payer: BC Managed Care – PPO | Admitting: Adult Health

## 2021-09-16 ENCOUNTER — Encounter: Payer: Self-pay | Admitting: *Deleted

## 2021-10-01 ENCOUNTER — Encounter: Payer: Self-pay | Admitting: Adult Health

## 2021-10-01 ENCOUNTER — Ambulatory Visit: Payer: BC Managed Care – PPO | Admitting: Family Medicine

## 2021-10-02 ENCOUNTER — Other Ambulatory Visit: Payer: Self-pay | Admitting: Nurse Practitioner

## 2021-10-02 DIAGNOSIS — F419 Anxiety disorder, unspecified: Secondary | ICD-10-CM

## 2021-10-05 MED ORDER — ALPRAZOLAM 0.5 MG PO TABS: ORAL_TABLET | ORAL | 0 refills | Status: DC

## 2021-10-06 ENCOUNTER — Encounter: Payer: Self-pay | Admitting: Adult Health

## 2021-10-06 ENCOUNTER — Ambulatory Visit (INDEPENDENT_AMBULATORY_CARE_PROVIDER_SITE_OTHER): Payer: BC Managed Care – PPO | Admitting: Adult Health

## 2021-10-06 VITALS — BP 120/78 | Temp 96.9°F | Wt 209.0 lb

## 2021-10-06 DIAGNOSIS — L282 Other prurigo: Secondary | ICD-10-CM | POA: Diagnosis not present

## 2021-10-06 MED ORDER — TRIAMCINOLONE ACETONIDE 0.5 % EX CREA: 1.0000 "application " | TOPICAL_CREAM | Freq: Two times a day (BID) | CUTANEOUS | 2 refills | Status: DC

## 2021-10-06 NOTE — Progress Notes (Signed)
Assessment and Plan:  Beth [name] was seen today for rash.  Diagnoses and all orders for this visit:  Pruritic rash Vague, very subtle; ? Subtle lesions could be insect bites though distribution atypical; will try 1-2 weeks of BID topical steroid, monitor. Keep log if recurrent any possible triggers, monitor home for insects or other local irritants.  -     triamcinolone cream (KENALOG) 0.5 %; Apply 1 application. topically 2 (two) times daily.  Discussed med's effects and SE's.   Over 20 minutes of exam, counseling, chart review, and critical decision making was performed.   Future Appointments  Date Time Provider [place]  10/21/2021 11:20 AM [name], MD [facility]  03/18/2022  3:00 PM [name], NP [facility] None    ------------------------------------------------------------------------------------------------------------------   HPI BP 120/78   Temp (!) 96.9 F (36.1 C)   Wt 209 lb (94.8 kg)   BMI 34.78 kg/m  66 y.o.female AA with lupus well controlled on imuran ([name]) presents for evaluation of pruritic rash to her left shoulder.   She reports noted itching to left shoulder area about 6 weeks ago, no atypical exposures, travel, new meds, supplements, detergents or hygiene products. She unsure what the rash looked like, but was scratching with back scratcher. Denies discharge or local pain. Reports sx did improve without intervention after about 3 weeks, but then seemed to restart about 1 week ago. Hasn't tried anything. No other areas.    Past Medical History:  Diagnosis Date   Anxiety    Chronic constipation    followed by dr Carlean Purl   CKD (chronic kidney disease), stage III ([place])    Enterocele    History of cervical cancer    hx squamous cell cervical carcinoma,   s/p  radical hysterectomy 11-24-1999   History of MRSA infection    2005--- buttock abscess   History of pulmonary embolus (PE)    post op 10-09-2017 (surgery on  09-19-2017)  lower range pulmonary artery,  resolved and completed 6 month Eliquis   History of renal cell carcinoma    s/p  left nephrectomy 09-19-2017  (oncologist -- dr Alen Blew / urologsit-- dr bell)   History of seizure    10/ 2001  one episode per discharge note in epic, 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 (pt had just been dx with lupus);  (05-21-2019 per pt none since)   Incisional hernia    MDD (major depressive disorder)    OA (osteoarthritis)    OSA on CPAP    followed by dr dohmeier   Pre-diabetes    Rectocele    RLS (restless legs syndrome) 01/24/2015   Per sleep study 05/2014    Solitary right kidney    acquired 09-19-2017 s/p left nephrectomy   SUI (stress urinary incontinence, female)    05-21-2019  improved with therapy   Systemic lupus erythematosus ([place])    rheumotologist--- dr Amil Amen (lov 03-19-2019 in epic)   Systemic lupus erythematosus ([place])    Vitamin D deficiency    Wears glasses      Allergies  Allergen Reactions   Bactrim [Sulfamethoxazole-Trimethoprim]     GI upset   Cymbalta [Duloxetine Hcl]     Sweating   Acyclovir And Related Itching    Current Outpatient Medications on File Prior to Visit  Medication Sig   acetaminophen (TYLENOL) 500 MG tablet Take 1,000 mg by mouth every 6 (six) hours as needed for mild pain.   ALPRAZolam (XANAX) 0.5 MG  tablet TAKE 1/2-1 TABLET 1-2 TIMES A DAY ONLY IF NEEDED FOR PANIC/ANXIETY ATTACK. LIMIT TO MAX 5 DAYS/WEEK TO AVOID ADDICTION & DEMENTIA. CONTINUE TO TAPER DOWN ON USE WITH GOAL TO STOP THIS MEDICATION. Strength: 0.5 mg   azaTHIOprine (IMURAN) 50 MG tablet Take 50 mg by mouth 2 (two) times daily.   buPROPion (WELLBUTRIN XL) 150 MG 24 hr tablet TAKE 1 TABLET EVERY MORNING FOR MOOD, FOCUS & CONCENTRATION   Cholecalciferol (VITAMIN D) 2000 UNITS CAPS Take 1 capsule by mouth daily.   escitalopram (LEXAPRO) 20 MG tablet TAKE 1.5 TABLETS (30 MG) DAILY FOR MOOD & CHRONIC ANXIETY    gabapentin (NEURONTIN) 300 MG capsule TAKE 1-2 CAPS AT NIGHT AS NEEDED FOR ANXIETY, PAIN AND SLEEP.   GEMTESA 75 MG TABS TAKE 1 TABLET BY MOUTH EVERY DAY   ibuprofen (ADVIL) 600 MG tablet Take 1 tablet (600 mg total) by mouth every 6 (six) hours as needed. (Patient taking differently: Take 600 mg by mouth every 6 (six) hours as needed. Ordered for post-op)   Magnesium 500 MG TABS Take 500 mg by mouth daily.    rosuvastatin (CRESTOR) 5 MG tablet Take 1 tab daily for cholesterol to reduce risk of heart attack/stroke.   acetaminophen (TYLENOL) 500 MG tablet Take 1 tablet (500 mg total) by mouth every 6 (six) hours as needed (pain).   ALPRAZolam (XANAX) 0.5 MG tablet TAKE 1/2-1 TABLET 1-2 TIMES A DAY ONLY IF NEEDED FOR PANIC/ANXIETY ATTACK. LIMIT TO MAX 5 DAYS/WEEK TO AVOID ADDICTION & DEMENTIA. CONTINUE TO TAPER DOWN ON USE WITH GOAL TO STOP THIS MEDICATION. Strength: 0.5 mg   oxyCODONE (OXY IR/ROXICODONE) 5 MG immediate release tablet Take 1 tablet (5 mg total) by mouth every 4 (four) hours as needed for severe pain. (Patient taking differently: Take 5 mg by mouth every 4 (four) hours as needed for severe pain. Hasn't started yet-(for post-op))   No current facility-administered medications on file prior to visit.    ROS: all negative except above.   Physical Exam:  BP 120/78   Temp (!) 96.9 F (36.1 C)   Wt 209 lb (94.8 kg)   BMI 34.78 kg/m   General Appearance: Well nourished, in no apparent distress. Eyes: PERRLA, conjunctiva no swelling or erythema ENT/Mouth: Ext aud canals clear, normal light reflex with TMs without erythema, bulging.  Good dentition. No erythema, swelling, or exudate on post pharynx. Tonsils not swollen or erythematous. Hearing normal.  Neck: Supple Respiratory: Respiratory effort normal Cardio: Appears well perfused Lymphatics: Non tender without lymphadenopathy.  Musculoskeletal: no obvious deformity; normal gait.  Skin: Warm, dry; she has approx 10 cm x 8 cm  area to left shoulder/upper back, dark brown post inflammatory type discoloration, no texture or clear rash, ? 1-2 very faintly erythematous subtle raised areas within.  Neuro: Normal muscle tone Psych: Awake and oriented X 3, normal affect, Insight and Judgment appropriate.   Izora Ribas, NP 10:01 AM [facility]

## 2021-10-16 ENCOUNTER — Encounter: Payer: Self-pay | Admitting: Adult Health

## 2021-10-20 ENCOUNTER — Other Ambulatory Visit: Payer: Self-pay | Admitting: Adult Health

## 2021-10-20 MED ORDER — WEGOVY 0.25 MG/0.5ML ~~LOC~~ SOAJ: 0.2500 mg | SUBCUTANEOUS | 0 refills | Status: DC

## 2021-10-21 ENCOUNTER — Other Ambulatory Visit (HOSPITAL_COMMUNITY)
Admission: RE | Admit: 2021-10-21 | Discharge: 2021-10-21 | Disposition: A | Discharge status: Home / Self Care | Payer: BC Managed Care – PPO | Source: Ambulatory Visit | Attending: Obstetrics and Gynecology | Admitting: Obstetrics and Gynecology

## 2021-10-21 ENCOUNTER — Ambulatory Visit (INDEPENDENT_AMBULATORY_CARE_PROVIDER_SITE_OTHER): Payer: BC Managed Care – PPO | Admitting: Obstetrics and Gynecology

## 2021-10-21 ENCOUNTER — Encounter: Payer: Self-pay | Admitting: Obstetrics and Gynecology

## 2021-10-21 VITALS — BP 121/74 | HR 77

## 2021-10-21 DIAGNOSIS — N393 Stress incontinence (female) (male): Secondary | ICD-10-CM

## 2021-10-21 DIAGNOSIS — N3281 Overactive bladder: Secondary | ICD-10-CM

## 2021-10-21 DIAGNOSIS — N898 Other specified noninflammatory disorders of vagina: Secondary | ICD-10-CM | POA: Diagnosis not present

## 2021-10-21 MED ORDER — SOLIFENACIN SUCCINATE 5 MG PO TABS: 5.0000 mg | ORAL_TABLET | Freq: Every day | ORAL | 5 refills | Status: DC

## 2021-10-21 NOTE — Progress Notes (Signed)
[facility]  Date of Visit: 10/21/2021  History of Present Illness: Beth [name] is a 66 y.o. female scheduled today for a post-operative visit.   Surgery: s/p Posterior repair, pelvic floor trigger point injection with botox on 09/10/21  Postoperative course has been uncomplicated.   Today she reports that once she started walking more then she started feeling some heaviness in her pelvis, but not pain. Has been walking about 1-1.5 miles.  She is using a lot of pads. When she has leakage, it comes with coughing or bending or with urgency. SUI = UUI. Gemtesa not really working all that well for her anymore.   UTI in the last 6 weeks? No  Pain? No  Vaginal bulge? Not sure because she feels the heaviness.  Stress incontinence: Yes  Urgency/frequency: Yes  Urge incontinence: Yes  Voiding dysfunction: No  Bowel issues: No - has been having a bowel movement a day without straining. Not really needing the miralax.   Subjective Success: Do you usually have a bulge or something falling out that you can see or feel in the vaginal area? No  Retreatment Success: Any retreatment with surgery or pessary for any compartment? No    Medications: She has a current medication list which includes the following prescription(s): solifenacin, acetaminophen, alprazolam, azathioprine, bupropion, vitamin d, escitalopram, gabapentin, gemtesa, ibuprofen, magnesium, rosuvastatin, wegovy, and triamcinolone cream.   Allergies: Patient is allergic to bactrim [sulfamethoxazole-trimethoprim], cymbalta [duloxetine hcl], and acyclovir and related.   Physical Exam: BP 121/74   Pulse 77    Pelvic Examination: Vagina: Incisions healing well. Sutures are not present at incision line and there is not granulation tissue. Speculum exam showed white vaginal discharge, aptima swab obtained. Mild tenderness on palpation of the suture knot on posterior vagina. Mild tenderness on palpation of pelvic floor muscles on  the left.   POP-Q: POP-Q  -3                                            Aa   -3                                           Ba  -8                                              C   3                                            Gh  4.5                                            Pb  8                                            tvl   -  3                                            Ap  -3                                            Bp  -8                                              D   --------------------------------------------------  Assessment and Plan:  1. Overactive bladder   2. Vaginal discharge   3. SUI (stress urinary incontinence, female)     - Healing well overall, no evidence of prolapse.  - We discussed options for SUI. For treatment of stress urinary incontinence,  non-surgical options include expectant management, weight loss, physical therapy, as well as a pessary.  Surgical options include a midurethral sling, and transurethral injection of a bulking agent. She does not want treatment at this time.  - She would like to try another medication for OAB. Has used myrbetriq in the past. Vesicare '5mg'$  daily sent to pharmacy.  - Discussed avoidance of heavy lifting and straining long term to reduce the risk of recurrence.  - aptima swab sent today  Follow up 6 weeks  [name], MD

## 2021-10-22 LAB — CERVICOVAGINAL ANCILLARY ONLY
Bacterial Vaginitis (gardnerella): NEGATIVE
Candida Glabrata: NEGATIVE
Candida Vaginitis: POSITIVE — AB
Comment: NEGATIVE
Comment: NEGATIVE
Comment: NEGATIVE

## 2021-10-22 MED ORDER — FLUCONAZOLE 150 MG PO TABS: 150.0000 mg | ORAL_TABLET | Freq: Once | ORAL | 0 refills | Status: AC

## 2021-10-22 NOTE — Addendum Note (Signed)
Addended by: [name] on: 10/22/2021 02:30 PM   Modules accepted: Orders

## 2021-10-28 ENCOUNTER — Other Ambulatory Visit: Payer: Self-pay

## 2021-10-28 MED ORDER — WEGOVY 0.25 MG/0.5ML ~~LOC~~ SOAJ: 0.2500 mg | SUBCUTANEOUS | 0 refills | Status: DC

## 2021-11-02 ENCOUNTER — Other Ambulatory Visit: Payer: Self-pay | Admitting: Nurse Practitioner

## 2021-11-02 DIAGNOSIS — F419 Anxiety disorder, unspecified: Secondary | ICD-10-CM

## 2021-11-03 ENCOUNTER — Other Ambulatory Visit: Payer: Self-pay

## 2021-11-03 MED ORDER — WEGOVY 0.25 MG/0.5ML ~~LOC~~ SOAJ: 0.2500 mg | SUBCUTANEOUS | 0 refills | Status: DC

## 2021-11-20 ENCOUNTER — Encounter: Payer: Self-pay | Admitting: Nurse Practitioner

## 2021-11-20 ENCOUNTER — Other Ambulatory Visit: Payer: Self-pay | Admitting: Nurse Practitioner

## 2021-11-20 DIAGNOSIS — F419 Anxiety disorder, unspecified: Secondary | ICD-10-CM

## 2021-11-21 MED ORDER — ALPRAZOLAM 0.5 MG PO TABS: ORAL_TABLET | ORAL | 0 refills | Status: DC

## 2021-11-23 ENCOUNTER — Other Ambulatory Visit: Payer: Self-pay | Admitting: Nurse Practitioner

## 2021-11-23 DIAGNOSIS — F419 Anxiety disorder, unspecified: Secondary | ICD-10-CM

## 2021-11-27 ENCOUNTER — Other Ambulatory Visit: Payer: Self-pay | Admitting: Nurse Practitioner

## 2021-11-27 ENCOUNTER — Encounter: Payer: Self-pay | Admitting: Nurse Practitioner

## 2021-11-27 DIAGNOSIS — B370 Candidal stomatitis: Secondary | ICD-10-CM

## 2021-11-27 MED ORDER — NYSTATIN 100000 UNIT/ML MT SUSP: OROMUCOSAL | 0 refills | Status: DC

## 2021-11-27 NOTE — Progress Notes (Unsigned)
Assessment and Plan: Beth [name] was seen today for follow-up.  Diagnoses and all orders for this visit:  Essential hypertension - continue DASH diet, exercise and monitor at home. Call if greater than 130/80.   Morbid obesity ([place])- BMI 30+ with OSA Fair life protein shakes Eat more frequently - try not to go more than 6 hours without protein Aim for 90 grams of protein a day- 30 breakfast/30 lunch 30 dinner Try to keep net carbs less than 50 Net Carbs=Total Carbs-fiber- sugar alcohols Exercise heartrate 120-140(fat burning zone)- walking 20-30 minutes 4 days a week - Advised to call around different to find where Beth [name] is in stock, once she finds will call prescription to that pharmacy - Follow up in 4 weeks from start of [facility]      Further disposition pending results of labs. Discussed med's effects and SE's.   Over 30 minutes of exam, counseling, chart review, and critical decision making was performed.   Future Appointments  Date Time Provider [place]  12/04/2021 11:20 AM [name], MD [facility]  03/18/2022  3:00 PM Darrol Jump, NP [facility] None    ------------------------------------------------------------------------------------------------------------------   HPI BP 140/82   Temp 97.9 F (36.6 C)   Ht 5' 1.5" (1.562 m)   Wt 210 lb 12.8 oz (95.6 kg)   BMI 39.19 kg/m    66 y.o.female presents for weight loss follow up. She has been prescribed [facility] 0.'25mg'$  but has not been able to get from the pharmacy as it is on backorder.   BMI is Body mass index is 39.19 kg/m., she has been working on diet and exercise. She is watching simple carbs and saturated fats.  Has not done any exercise as of yet.  Intermittent fasting until 12 then has sandwich or vegetables and dinner is usually veg and meat Wt Readings from Last 3 Encounters:  12/01/21 210 lb 12.8 oz (95.6 kg)  10/06/21 209 lb (94.8 kg)  09/10/21 203 lb 11.2 oz (92.4 kg)    Bp is well  controlled without medication. Denies headaches, chest pain, shortness of breath and dizziness  BP Readings from Last 3 Encounters:  12/01/21 140/82  10/21/21 121/74  10/06/21 120/78       Past Medical History:  Diagnosis Date   Anxiety    Chronic constipation    followed by dr Carlean Purl   CKD (chronic kidney disease), stage III ([place])    Enterocele    History of cervical cancer    hx squamous cell cervical carcinoma,   s/p  radical hysterectomy 11-24-1999   History of MRSA infection    2005--- buttock abscess   History of pulmonary embolus (PE)    post op 10-09-2017 (surgery on 09-19-2017)  lower range pulmonary artery,  resolved and completed 6 month Eliquis   History of renal cell carcinoma    s/p  left nephrectomy 09-19-2017  (oncologist -- dr Alen Blew / urologsit-- dr bell)   History of seizure    10/ 2001  one episode per discharge note in epic, 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 (pt had just been dx with lupus);  (05-21-2019 per pt none since)   Incisional hernia    MDD (major depressive disorder)    OA (osteoarthritis)    OSA on CPAP    followed by dr dohmeier   Pre-diabetes    Rectocele    RLS (restless legs syndrome) 01/24/2015   Per sleep study 05/2014    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   SUI (stress urinary incontinence, female)    05-21-2019  improved with therapy   Systemic lupus erythematosus (Beth [name])    rheumotologist--- dr Amil Amen (lov 03-19-2019 in epic)   Systemic lupus erythematosus (Beth [name])    Vitamin D deficiency    Wears glasses      Allergies  Allergen Reactions   Bactrim [Sulfamethoxazole-Trimethoprim]     GI upset   Cymbalta [Duloxetine Hcl]     Sweating   Acyclovir And Related Itching    Current Outpatient Medications on File Prior to Visit  Medication Sig   acetaminophen (TYLENOL) 500 MG tablet Take 1,000 mg by mouth every 6 (six) hours as needed for mild pain.   ALPRAZolam  (XANAX) 0.5 MG tablet TAKE 1/2-1 TABLET 1-2 TIMES A DAY ONLY IF NEEDED FOR PANIC/ANXIETY ATTACK. LIMIT TO MAX 5 DAYS/WEEK TO AVOID ADDICTION & DEMENTIA. CONTINUE TO TAPER DOWN ON USE WITH GOAL TO STOP THIS MEDICATION. STRENGTH: 0.5 MG   azaTHIOprine (IMURAN) 50 MG tablet Take 50 mg by mouth 2 (two) times daily.   buPROPion (WELLBUTRIN XL) 150 MG 24 hr tablet TAKE 1 TABLET EVERY MORNING FOR MOOD, FOCUS & CONCENTRATION   Cholecalciferol (VITAMIN D) 2000 UNITS CAPS Take 1 capsule by mouth daily.   escitalopram (LEXAPRO) 20 MG tablet TAKE 1.5 TABLETS (30 MG) DAILY FOR MOOD & CHRONIC ANXIETY   gabapentin (NEURONTIN) 300 MG capsule TAKE 1-2 CAPS AT NIGHT AS NEEDED FOR ANXIETY, PAIN AND SLEEP.   GEMTESA 75 MG TABS TAKE 1 TABLET BY MOUTH EVERY DAY   ibuprofen (ADVIL) 600 MG tablet Take 1 tablet (600 mg total) by mouth every 6 (six) hours as needed. (Patient taking differently: Take 600 mg by mouth every 6 (six) hours as needed. Ordered for post-op)   Magnesium 500 MG TABS Take 500 mg by mouth daily.    nystatin (MYCOSTATIN) 100000 UNIT/ML suspension 5 ml four times a day, retain in mouth as long as possible (Swish and Spit).  Use for 48 hours after symptoms resolve.   rosuvastatin (CRESTOR) 5 MG tablet Take 1 tab daily for cholesterol to reduce risk of heart attack/stroke.   Semaglutide-Weight Management (WEGOVY) 0.25 MG/0.5ML SOAJ Inject 0.25 mg into the skin once a week.   solifenacin (VESICARE) 5 MG tablet Take 1 tablet (5 mg total) by mouth daily.   triamcinolone cream (KENALOG) 0.5 % Apply 1 application. topically 2 (two) times daily.   No current facility-administered medications on file prior to visit.    ROS: all negative except above.   Physical Exam:  BP 140/82   Temp 97.9 F (36.6 C)   Ht 5' 1.5" (1.562 m)   Wt 210 lb 12.8 oz (95.6 kg)   BMI 39.19 kg/m   General Appearance: Morbidly obese pleasant female, in no apparent distress. Eyes: PERRLA, EOMs, conjunctiva no swelling or  erythema Sinuses: No Frontal/maxillary tenderness ENT/Mouth: Ext aud canals clear, TMs without erythema, bulging. No erythema, swelling, or exudate on post pharynx.  Tonsils not swollen or erythematous. Hearing normal.  Neck: Supple, thyroid normal.  Respiratory: Respiratory effort normal, BS equal bilaterally without rales, rhonchi, wheezing or stridor.  Cardio: RRR with no MRGs. Brisk peripheral pulses without edema.  Abdomen: Soft, + BS.  Non tender, no guarding, rebound, hernias, masses. Lymphatics: Non tender without lymphadenopathy.  Musculoskeletal: Full ROM, 5/5 strength, normal gait.  Skin: Warm, dry without rashes, lesions, ecchymosis.  Neuro: Cranial nerves intact. Normal muscle tone, no cerebellar symptoms. Sensation intact.  Psych: Awake and oriented X 3, normal affect, Insight and Judgment appropriate.     Alycia Rossetti, NP 9:08 AM [facility]

## 2021-12-01 ENCOUNTER — Encounter: Payer: Self-pay | Admitting: Nurse Practitioner

## 2021-12-01 ENCOUNTER — Ambulatory Visit (INDEPENDENT_AMBULATORY_CARE_PROVIDER_SITE_OTHER): Payer: BC Managed Care – PPO | Admitting: Nurse Practitioner

## 2021-12-01 VITALS — BP 140/82 | HR 72 | Temp 97.9°F | Ht 61.5 in | Wt 210.8 lb

## 2021-12-01 DIAGNOSIS — I10 Essential (primary) hypertension: Secondary | ICD-10-CM | POA: Diagnosis not present

## 2021-12-01 NOTE — Patient Instructions (Signed)
Fair life protein shakes Eat more frequently - try not to go more than 6 hours without protein Aim for 90 grams of protein a day- 30 breakfast/30 lunch 30 dinner Try to keep net carbs less than 50 Net Carbs=Total Carbs-fiber- sugar alcohols Exercise heartrate 120-140(fat burning zone)- walking 20-30 minutes 4 days a week  

## 2021-12-04 ENCOUNTER — Encounter: Payer: Self-pay | Admitting: Obstetrics and Gynecology

## 2021-12-04 ENCOUNTER — Ambulatory Visit: Payer: BC Managed Care – PPO | Admitting: Obstetrics and Gynecology

## 2021-12-04 VITALS — BP 131/79 | HR 71

## 2021-12-04 DIAGNOSIS — N393 Stress incontinence (female) (male): Secondary | ICD-10-CM

## 2021-12-04 DIAGNOSIS — N3281 Overactive bladder: Secondary | ICD-10-CM | POA: Diagnosis not present

## 2021-12-04 DIAGNOSIS — K5904 Chronic idiopathic constipation: Secondary | ICD-10-CM | POA: Diagnosis not present

## 2021-12-04 NOTE — Progress Notes (Signed)
[facility] Return Visit  SUBJECTIVE  History of Present Illness: Beth [name] is a 66 y.o. female seen in follow-up for mixed incontinence. Plan at last visit was to start vesicare '5mg'$  since [place] had not been working well for her. The vesicare has decreased her bladder leakage overall but still having leakage with bending over. She has been thinking about her options and wants to try a pessary. She also sometimes is feeling a pressure/ vaginal bulge and thinks maybe the prolapse has come back. She is wondering if a pessary will also help this.   Has been using the mirlax daily and has a BM once a day. Has not been taking the linzess- too potent and sometimes has accidents  Surgery: s/p Posterior repair, pelvic floor trigger point injection with botox on 09/10/21.  Past Medical History: Patient  has a past medical history of Anxiety, Chronic constipation, CKD (chronic kidney disease), stage III ([place]), Enterocele, History of cervical cancer, History of MRSA infection, History of pulmonary embolus (PE), History of renal cell carcinoma, History of seizure, Incisional hernia, MDD (major depressive disorder), OA (osteoarthritis), OSA on CPAP, Pre-diabetes, Rectocele, RLS (restless legs syndrome) (01/24/2015), Solitary right kidney, SUI (stress urinary incontinence, female), Systemic lupus erythematosus ([place]), Systemic lupus erythematosus ([place]), Vitamin D deficiency, and Wears glasses.   Past Surgical History: She  has a past surgical history that includes Cesarean section (x3   last one 1980); Laparoscopic nephrectomy, hand assisted (Left, 09/19/2017); Colonoscopy; EXPLORATORY LAPAROTOMY MODIFIED RADICAL HYSTERECTOMY WITH PELVIC LYMPHADENECTOMY AND RESECTION MECKEL'S DIVERTICULUM (11-24-1999 dr Aldean Ast  '@[facility]'$ ); COLD KNIFE CERVICAL CONIZATION  (10/07/1999  '@[facility]'$ ); Incisional hernia repair (N/A, 05/23/2019); and Rectocele repair (N/A, 09/10/2021).   Medications: She has a current medication  list which includes the following prescription(s): acetaminophen, alprazolam, azathioprine, bupropion, vitamin d, escitalopram, gabapentin, gemtesa, ibuprofen, magnesium, nystatin, rosuvastatin, wegovy, solifenacin, and triamcinolone cream.   Allergies: Patient is allergic to bactrim [sulfamethoxazole-trimethoprim], cymbalta [duloxetine hcl], and acyclovir and related.   Social History: Patient  reports that she has never smoked. She has never used smokeless tobacco. She reports that she does not drink alcohol and does not use drugs.      OBJECTIVE     Physical Exam: Vitals:   12/04/21 1125  BP: 131/79  Pulse: 71   Gen: No apparent distress, A&O x 3.  Detailed Urogynecologic Evaluation:  Normal external genitalia. CST negative. Speculum exam revealed normal vaginal mucosa. On palpation of pelvic floor muscles, no tenderness. Hard ball of stool noted in rectum and this was tender to palpation.   POP-Q  -3                                            Aa   -3                                           Ba  -8                                              C   2.5  Gh  5                                            Pb  8                                            tvl   -3                                            Ap  -3                                            Bp                                                 D   She was fit with a 2-1/4in incontinence ring pessary. It was comfortable, fit well, and stayed in placed with strong cough, valsalva and bending.      ASSESSMENT AND PLAN    Ms. Beth [name] is a 66 y.o. with:  1. SUI (stress urinary incontinence, female)   2. Overactive bladder   3. Chronic idiopathic constipation    - Fit with a 2-1/4 in incontinence ring. She will keep it in place until next visit.  - For OAB, continue with the vesicare '5mg'$  - She will follow up again with Dr Carlean Purl regarding her constipation.    Return 1 month for follow up  [name], MD  Time spent: I spent 25 minutes dedicated to the care of this patient on the date of this encounter to include pre-visit review of records, face-to-face time with the patient and post visit documentation. Additional time was spent on the pessary fitting.

## 2021-12-09 ENCOUNTER — Encounter: Payer: Self-pay | Admitting: Nurse Practitioner

## 2021-12-10 ENCOUNTER — Ambulatory Visit: Payer: BC Managed Care – PPO | Admitting: Nurse Practitioner

## 2021-12-10 ENCOUNTER — Encounter: Payer: Self-pay | Admitting: Nurse Practitioner

## 2021-12-10 VITALS — BP 142/74 | HR 72 | Temp 97.5°F | Ht 61.5 in | Wt 212.4 lb

## 2021-12-10 DIAGNOSIS — I10 Essential (primary) hypertension: Secondary | ICD-10-CM | POA: Diagnosis not present

## 2021-12-10 DIAGNOSIS — H109 Unspecified conjunctivitis: Secondary | ICD-10-CM

## 2021-12-10 DIAGNOSIS — B9689 Other specified bacterial agents as the cause of diseases classified elsewhere: Secondary | ICD-10-CM | POA: Diagnosis not present

## 2021-12-10 MED ORDER — GENTAMICIN SULFATE 0.3 % OP SOLN: 1.0000 [drp] | Freq: Three times a day (TID) | OPHTHALMIC | 0 refills | Status: AC

## 2021-12-10 NOTE — Patient Instructions (Addendum)
Apply garamycin drop 1 drop into each eye three times a day for 5-7 days.  If no improvement notify the office   Bacterial Conjunctivitis, Adult Bacterial conjunctivitis is an infection of the clear membrane that covers the white part of the eye and the inner surface of the eyelid (conjunctiva). When the blood vessels in the conjunctiva become inflamed, the eye becomes red or pink. The eye often feels irritated or itchy. Bacterial conjunctivitis spreads easily from person to person (is contagious). It also spreads easily from one eye to the other eye. What are the causes? This condition is caused by bacteria. You may get the infection if you come into close contact with: A person who is infected with the bacteria. Items that are contaminated with the bacteria, such as a face towel, contact lens solution, or eye makeup. What increases the risk? You are more likely to develop this condition if: You are exposed to other people who have the infection. You wear contact lenses. You have a sinus infection. You have had a recent eye injury or surgery. You have a weak body defense system (immune system). You have a medical condition that causes dry eyes. What are the signs or symptoms? Symptoms of this condition include: Thick, yellowish discharge from the eye. This may turn into a crust on the eyelid overnight and cause your eyelids to stick together. Tearing or watery eyes. Itchy eyes. Burning feeling in your eyes. Eye redness. Swollen eyelids. Blurred vision. How is this diagnosed? This condition is diagnosed based on your symptoms and medical history. Your health care provider may also take a sample of discharge from your eye to find the cause of your infection. How is this treated? This condition may be treated with: Antibiotic eye drops or ointment to clear the infection more quickly and prevent the spread of infection to others. Antibiotic medicines taken by mouth (orally) to treat  infections that do not respond to drops or ointments or that last longer than 10 days. Cool, wet cloths (cool compresses) placed on the eyes. Artificial tears applied 2-6 times a day. Follow these instructions at home: Medicines Take or apply your antibiotic medicine as told by your health care provider. Do not stop using the antibiotic, even if your condition improves, unless directed by your health care provider. Take or apply over-the-counter and prescription medicines only as told by your health care provider. Be very careful to avoid touching the edge of your eyelid with the eye-drop bottle or the ointment tube when you apply medicines to the affected eye. This will keep you from spreading the infection to your other eye or to other people. Managing discomfort Gently wipe away any drainage from your eye with a warm, wet washcloth or a cotton ball. Apply a clean, cool compress to your eye for 10-20 minutes, 3-4 times a day. General instructions Do not wear contact lenses until the inflammation is gone and your health care provider says it is safe to wear them again. Ask your health care provider how to sterilize or replace your contact lenses before you use them again. Wear glasses until you can resume wearing contact lenses. Avoid wearing eye makeup until the inflammation is gone. Throw away any old eye cosmetics that may be contaminated. Change or wash your pillowcase every day. Do not share towels or washcloths. This may spread the infection. Wash your hands often with soap and water for at least 20 seconds and especially before touching your face or eyes. Use paper  towels to dry your hands. Avoid touching or rubbing your eyes. Do not drive or use heavy machinery if your vision is blurred. Contact a health care provider if: You have a fever. Your symptoms do not get better after 10 days. Get help right away if: You have a fever and your symptoms suddenly get worse. You have severe pain  when you move your eye. You have facial pain, redness, or swelling. You have a sudden loss of vision. Summary Bacterial conjunctivitis is an infection of the clear membrane that covers the white part of the eye and the inner surface of the eyelid (conjunctiva). Bacterial conjunctivitis spreads easily from eye to eye and from person to person (is contagious). Wash your hands often with soap and water for at least 20 seconds and especially before touching your face or eyes. Use paper towels to dry your hands. Take or apply your antibiotic medicine as told by your health care provider. Do not stop using the antibiotic even if your condition improves. Contact a health care provider if you have a fever or if your symptoms do not get better after 10 days. Get help right away if you have a sudden loss of vision. This information is not intended to replace advice given to you by your health care provider. Make sure you discuss any questions you have with your health care provider. Document Revised: 08/13/2020 Document Reviewed: 08/13/2020 Elsevier Patient Education  [place].

## 2021-12-10 NOTE — Progress Notes (Signed)
Assessment and Plan:  Beth [name] was seen today for acute visit.  Diagnoses and all orders for this visit:  Bacterial conjunctivitis of both eyes Use gentamicin drops as directed and stay out of work until Monday due to contagious nature -     gentamicin (GARAMYCIN) 0.3 % ophthalmic solution; Place 1 drop into both eyes 3 (three) times daily for 10 days.  Essential hypertension - continue medications, DASH diet, exercise and monitor at home. Call if greater than 130/80.  Go to the ER if any chest pain, shortness of breath, nausea, dizziness, severe HA, changes vision/speech       Further disposition pending results of labs. Discussed med's effects and SE's.   Over 30 minutes of exam, counseling, chart review, and critical decision making was performed.   Future Appointments  Date Time Provider [place]  01/06/2022 10:30 AM Alycia Rossetti, NP [facility] None  01/13/2022 11:40 AM [name], MD [facility]  03/18/2022  3:00 PM Darrol Jump, NP [facility] None    ------------------------------------------------------------------------------------------------------------------   HPI BP (!) 142/74   Pulse 72   Temp (!) 97.5 F (36.4 C)   Ht 5' 1.5" (1.562 m)   Wt 212 lb 6.4 oz (96.3 kg)   BMI 39.48 kg/m   66 y.o.female presents for  approx a week she has noticed swelling and redness in right eye, then the next day the left eye is red and irritated. Has white crusting of eyes in the morning after a night of sleep   She is currently not on  medication and BP has been well controlled.  Denies headaches, chest pain, shortness of breath and dizziness. BP Readings from Last 3 Encounters:  12/10/21 (!) 142/74  12/04/21 131/79  12/01/21 140/82    Past Medical History:  Diagnosis Date   Anxiety    Chronic constipation    followed by dr Carlean Purl   CKD (chronic kidney disease), stage III ([place])    Enterocele    History of cervical cancer    hx squamous cell  cervical carcinoma,   s/p  radical hysterectomy 11-24-1999   History of MRSA infection    2005--- buttock abscess   History of pulmonary embolus (PE)    post op 10-09-2017 (surgery on 09-19-2017)  lower range pulmonary artery,  resolved and completed 6 month Eliquis   History of renal cell carcinoma    s/p  left nephrectomy 09-19-2017  (oncologist -- dr Alen Blew / urologsit-- dr bell)   History of seizure    10/ 2001  one episode per discharge note in epic, 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 (pt had just been dx with lupus);  (05-21-2019 per pt none since)   Incisional hernia    MDD (major depressive disorder)    OA (osteoarthritis)    OSA on CPAP    followed by dr dohmeier   Pre-diabetes    Rectocele    RLS (restless legs syndrome) 01/24/2015   Per sleep study 05/2014    Solitary right kidney    acquired 09-19-2017 s/p left nephrectomy   SUI (stress urinary incontinence, female)    05-21-2019  improved with therapy   Systemic lupus erythematosus ([place])    rheumotologist--- dr Amil Amen (lov 03-19-2019 in epic)   Systemic lupus erythematosus ([place])    Vitamin D deficiency    Wears glasses      Allergies  Allergen Reactions   Bactrim [Sulfamethoxazole-Trimethoprim]     GI upset   Cymbalta [Duloxetine  Hcl]     Sweating   Acyclovir And Related Itching    Current Outpatient Medications on File Prior to Visit  Medication Sig   acetaminophen (TYLENOL) 500 MG tablet Take 1,000 mg by mouth every 6 (six) hours as needed for mild pain.   ALPRAZolam (XANAX) 0.5 MG tablet TAKE 1/2-1 TABLET 1-2 TIMES A DAY ONLY IF NEEDED FOR PANIC/ANXIETY ATTACK. LIMIT TO MAX 5 DAYS/WEEK TO AVOID ADDICTION & DEMENTIA. CONTINUE TO TAPER DOWN ON USE WITH GOAL TO STOP THIS MEDICATION. STRENGTH: 0.5 MG   azaTHIOprine (IMURAN) 50 MG tablet Take 50 mg by mouth 2 (two) times daily.   buPROPion (WELLBUTRIN XL) 150 MG 24 hr tablet TAKE 1 TABLET EVERY MORNING FOR MOOD, FOCUS &  CONCENTRATION   Cholecalciferol (VITAMIN D) 2000 UNITS CAPS Take 1 capsule by mouth daily.   escitalopram (LEXAPRO) 20 MG tablet TAKE 1.5 TABLETS (30 MG) DAILY FOR MOOD & CHRONIC ANXIETY   gabapentin (NEURONTIN) 300 MG capsule TAKE 1-2 CAPS AT NIGHT AS NEEDED FOR ANXIETY, PAIN AND SLEEP.   GEMTESA 75 MG TABS TAKE 1 TABLET BY MOUTH EVERY DAY   ibuprofen (ADVIL) 600 MG tablet Take 1 tablet (600 mg total) by mouth every 6 (six) hours as needed. (Patient taking differently: Take 600 mg by mouth every 6 (six) hours as needed. Ordered for post-op)   Magnesium 500 MG TABS Take 500 mg by mouth daily.    rosuvastatin (CRESTOR) 5 MG tablet Take 1 tab daily for cholesterol to reduce risk of heart attack/stroke.   solifenacin (VESICARE) 5 MG tablet Take 1 tablet (5 mg total) by mouth daily.   triamcinolone cream (KENALOG) 0.5 % Apply 1 application. topically 2 (two) times daily.   nystatin (MYCOSTATIN) 100000 UNIT/ML suspension 5 ml four times a day, retain in mouth as long as possible (Swish and Spit).  Use for 48 hours after symptoms resolve. (Patient not taking: Reported on 12/10/2021)   Semaglutide-Weight Management (WEGOVY) 0.25 MG/0.5ML SOAJ Inject 0.25 mg into the skin once a week. (Patient not taking: Reported on 12/10/2021)   No current facility-administered medications on file prior to visit.    ROS: all negative except above.   Physical Exam:  BP (!) 142/74   Pulse 72   Temp (!) 97.5 F (36.4 C)   Ht 5' 1.5" (1.562 m)   Wt 212 lb 6.4 oz (96.3 kg)   BMI 39.48 kg/m   General Appearance: Well nourished, in no apparent distress. Eyes: PERRLA, EOMs, conjunctiva erythematous with white crusting d/c at medial canthus Sinuses: No Frontal/maxillary tenderness ENT/Mouth: Ext aud canals clear, TMs without erythema, bulging. No erythema, swelling, or exudate on post pharynx.  Tonsils not swollen or erythematous. Hearing normal.  Neck: Supple, thyroid normal.  Respiratory: Respiratory effort  normal, BS equal bilaterally without rales, rhonchi, wheezing or stridor.  Cardio: RRR with no MRGs. Brisk peripheral pulses without edema.  Abdomen: Soft, + BS.  Non tender, no guarding, rebound, hernias, masses. Lymphatics: Non tender without lymphadenopathy.  Musculoskeletal: Full ROM, 5/5 strength, normal gait.  Skin: Warm, dry without rashes, lesions, ecchymosis.  Neuro: Cranial nerves intact. Normal muscle tone, no cerebellar symptoms. Sensation intact.  Psych: Awake and oriented X 3, normal affect, Insight and Judgment appropriate.     Alycia Rossetti, NP 3:26 PM [facility]

## 2021-12-21 ENCOUNTER — Encounter: Payer: Self-pay | Admitting: Nurse Practitioner

## 2021-12-28 ENCOUNTER — Other Ambulatory Visit: Payer: Self-pay | Admitting: Internal Medicine

## 2021-12-29 ENCOUNTER — Other Ambulatory Visit: Payer: Self-pay | Admitting: Internal Medicine

## 2022-01-06 ENCOUNTER — Encounter: Payer: Self-pay | Admitting: Nurse Practitioner

## 2022-01-06 ENCOUNTER — Ambulatory Visit: Payer: BC Managed Care – PPO | Admitting: Nurse Practitioner

## 2022-01-11 NOTE — Progress Notes (Unsigned)
Assessment and Plan: Beth [name] was seen today for follow-up.  Diagnoses and all orders for this visit:  Essential hypertension - continue medications, DASH diet, exercise and monitor at home. Call if greater than 130/80.   -     CBC with Differential/Platelet  Morbid obesity ([place]) Long discussion about weight loss, diet, and exercise Recommended diet heavy in fruits and veggies and low in animal meats, cheeses, and dairy products, appropriate calorie intake Patient will work on increasing activity and decreasing saturated fats and simple carbs Follow up at next visit -     TSH  Hyperlipidemia, unspecified hyperlipidemia type Continue Rosuvastatin 5 mg, diet and exercise -     COMPLETE METABOLIC PANEL WITH GFR -     Lipid panel -     TSH  Abnormal glucose Continue diet and exercise  Medication management -     CBC with Differential/Platelet -     COMPLETE METABOLIC PANEL WITH GFR -     Lipid panel -     TSH       Further disposition pending results of labs. Discussed med's effects and SE's.   Over 30 minutes of exam, counseling, chart review, and critical decision making was performed.   Future Appointments  Date Time Provider [place]  01/13/2022 11:40 AM [name], MD [facility]  03/18/2022  3:00 PM Darrol Jump, NP [facility] None    ------------------------------------------------------------------------------------------------------------------   HPI BP 124/78   Pulse 66   Temp (!) 97.5 F (36.4 C)   Ht 5' 1.5" (1.562 m)   Wt 208 lb 12.8 oz (94.7 kg)   SpO2 96%   BMI 38.81 kg/m   66 y.o.female 3 month follow up of abnormal glucose, HTN, HLD, Obestiy and presents for reevaluation of red eyes. She did gentamicin but did not affect the redness of her eyes.  Previously thought she had covid but was negative. She does have an appointment with the eye doctor but not until 03/09/22.  Her eyes are much less red today   She has been coughing  only at night. Occasionally when she eats she will get an epigastric pain. Occurs more with bread or meat.   BP's have been controlled at home without medication.  Denies headaches, chest pain, shortness of breath and dizziness.  BP Readings from Last 3 Encounters:  01/12/22 124/78  12/10/21 (!) 142/74  12/04/21 131/79    BMI is Body mass index is 38.81 kg/m., she has been working on diet and exercise. Wt Readings from Last 3 Encounters:  01/12/22 208 lb 12.8 oz (94.7 kg)  12/10/21 212 lb 6.4 oz (96.3 kg)  12/01/21 210 lb 12.8 oz (95.6 kg)    She is currently on Rosuvastatin 5 mg , cholesterol is not currently at goal .  Denies myalgaias Lab Results  Component Value Date   CHOL 193 09/02/2021   HDL 36 (L) 09/02/2021   LDLCALC 124 (H) 09/02/2021   TRIG 210 (H) 09/02/2021   CHOLHDL 5.4 (H) 09/02/2021   She does have abnormal glucose currently controlled with diet and exercise Lab Results  Component Value Date   HGBA1C 6.1 (H) 09/02/2021       Past Medical History:  Diagnosis Date   Anxiety    Chronic constipation    followed by dr Carlean Purl   CKD (chronic kidney disease), stage III ([place])    Enterocele    History of cervical cancer    hx squamous cell cervical carcinoma,   s/p  radical hysterectomy 11-24-1999   History of MRSA infection    2005--- buttock abscess   History of pulmonary embolus (PE)    post op 10-09-2017 (surgery on 09-19-2017)  lower range pulmonary artery,  resolved and completed 6 month Eliquis   History of renal cell carcinoma    s/p  left nephrectomy 09-19-2017  (oncologist -- dr Alen Blew / urologsit-- dr bell)   History of seizure    10/ 2001  one episode per discharge note in epic, 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 (pt had just been dx with lupus);  (05-21-2019 per pt none since)   Incisional hernia    MDD (major depressive disorder)    OA (osteoarthritis)    OSA on CPAP    followed by dr dohmeier   Pre-diabetes     Rectocele    RLS (restless legs syndrome) 01/24/2015   Per sleep study 05/2014    Solitary right kidney    acquired 09-19-2017 s/p left nephrectomy   SUI (stress urinary incontinence, female)    05-21-2019  improved with therapy   Systemic lupus erythematosus ([place])    rheumotologist--- dr Amil Amen (lov 03-19-2019 in epic)   Systemic lupus erythematosus ([place])    Vitamin D deficiency    Wears glasses      Allergies  Allergen Reactions   Bactrim [Sulfamethoxazole-Trimethoprim]     GI upset   Cymbalta [Duloxetine Hcl]     Sweating   Acyclovir And Related Itching    Current Outpatient Medications on File Prior to Visit  Medication Sig   acetaminophen (TYLENOL) 500 MG tablet Take 1,000 mg by mouth every 6 (six) hours as needed for mild pain.   ALPRAZolam (XANAX) 0.5 MG tablet TAKE 1/2-1 TABLET 1-2 TIMES A DAY ONLY IF NEEDED FOR PANIC/ANXIETY ATTACK. LIMIT TO MAX 5 DAYS/WEEK TO AVOID ADDICTION & DEMENTIA. CONTINUE TO TAPER DOWN ON USE WITH GOAL TO STOP THIS MEDICATION. STRENGTH: 0.5 MG   azaTHIOprine (IMURAN) 50 MG tablet Take 50 mg by mouth 2 (two) times daily.   buPROPion (WELLBUTRIN XL) 150 MG 24 hr tablet TAKE 1 TABLET EVERY MORNING FOR MOOD, FOCUS & CONCENTRATION   Cholecalciferol (VITAMIN D) 2000 UNITS CAPS Take 1 capsule by mouth daily.   escitalopram (LEXAPRO) 20 MG tablet TAKE 1.5 TABLETS (30 MG) DAILY FOR MOOD & CHRONIC ANXIETY   gabapentin (NEURONTIN) 300 MG capsule TAKE 1-2 CAPS AT NIGHT AS NEEDED FOR ANXIETY, PAIN AND SLEEP.   ibuprofen (ADVIL) 600 MG tablet Take 1 tablet (600 mg total) by mouth every 6 (six) hours as needed. (Patient taking differently: Take 600 mg by mouth every 6 (six) hours as needed. Ordered for post-op)   Magnesium 500 MG TABS Take 500 mg by mouth daily.    rosuvastatin (CRESTOR) 5 MG tablet Take 1 tab daily for cholesterol to reduce risk of heart attack/stroke.   solifenacin (VESICARE) 5 MG tablet Take 1 tablet (5 mg total) by mouth daily.   No  current facility-administered medications on file prior to visit.    ROS: all negative except above.   Physical Exam:  BP 124/78   Pulse 66   Temp (!) 97.5 F (36.4 C)   Ht 5' 1.5" (1.562 m)   Wt 208 lb 12.8 oz (94.7 kg)   SpO2 96%   BMI 38.81 kg/m   General Appearance: Well nourished, in no apparent distress. Eyes: PERRLA, EOMs, conjunctiva no swelling or erythema Sinuses: No Frontal/maxillary tenderness ENT/Mouth: Ext aud canals clear, TMs  without erythema, bulging. No erythema, swelling, or exudate on post pharynx.  Tonsils not swollen or erythematous. Hearing normal.  Neck: Supple, thyroid normal.  Respiratory: Respiratory effort normal, BS equal bilaterally without rales, rhonchi, wheezing or stridor.  Cardio: RRR with no MRGs. Brisk peripheral pulses without edema.  Abdomen: Soft, + BS.  Non tender, no guarding, rebound, hernias, masses. Lymphatics: Non tender without lymphadenopathy.  Musculoskeletal: Full ROM, 5/5 strength, normal gait.  Skin: Warm, dry without rashes, lesions, ecchymosis.  Neuro: Cranial nerves intact. Normal muscle tone, no cerebellar symptoms. Sensation intact.  Psych: Awake and oriented X 3, normal affect, Insight and Judgment appropriate.     Alycia Rossetti, NP 10:55 AM [facility]

## 2022-01-12 ENCOUNTER — Ambulatory Visit (INDEPENDENT_AMBULATORY_CARE_PROVIDER_SITE_OTHER): Payer: BC Managed Care – PPO | Admitting: Nurse Practitioner

## 2022-01-12 ENCOUNTER — Encounter: Payer: Self-pay | Admitting: Nurse Practitioner

## 2022-01-12 VITALS — BP 124/78 | HR 66 | Temp 97.5°F | Ht 61.5 in | Wt 208.8 lb

## 2022-01-12 DIAGNOSIS — Z79899 Other long term (current) drug therapy: Secondary | ICD-10-CM

## 2022-01-12 DIAGNOSIS — R7309 Other abnormal glucose: Secondary | ICD-10-CM

## 2022-01-12 DIAGNOSIS — E785 Hyperlipidemia, unspecified: Secondary | ICD-10-CM

## 2022-01-12 DIAGNOSIS — I10 Essential (primary) hypertension: Secondary | ICD-10-CM

## 2022-01-13 ENCOUNTER — Encounter: Payer: Self-pay | Admitting: Obstetrics and Gynecology

## 2022-01-13 ENCOUNTER — Ambulatory Visit: Payer: BC Managed Care – PPO | Admitting: Obstetrics and Gynecology

## 2022-01-13 VITALS — BP 126/78 | HR 82

## 2022-01-13 DIAGNOSIS — N393 Stress incontinence (female) (male): Secondary | ICD-10-CM

## 2022-01-13 DIAGNOSIS — N3281 Overactive bladder: Secondary | ICD-10-CM | POA: Diagnosis not present

## 2022-01-13 LAB — COMPLETE METABOLIC PANEL WITH GFR
AG Ratio: 1.1 (calc) (ref 1.0–2.5)
ALT: 20 U/L (ref 6–29)
AST: 25 U/L (ref 10–35)
Albumin: 4 g/dL (ref 3.6–5.1)
Alkaline phosphatase (APISO): 44 U/L (ref 37–153)
BUN/Creatinine Ratio: 18 (calc) (ref 6–22)
BUN: 21 mg/dL (ref 7–25)
CO2: 26 mmol/L (ref 20–32)
Calcium: 9.9 mg/dL (ref 8.6–10.4)
Chloride: 107 mmol/L (ref 98–110)
Creat: 1.17 mg/dL — ABNORMAL HIGH (ref 0.50–1.05)
Globulin: 3.5 g/dL (calc) (ref 1.9–3.7)
Glucose, Bld: 78 mg/dL (ref 65–99)
Potassium: 5 mmol/L (ref 3.5–5.3)
Sodium: 140 mmol/L (ref 135–146)
Total Bilirubin: 0.4 mg/dL (ref 0.2–1.2)
Total Protein: 7.5 g/dL (ref 6.1–8.1)
eGFR: 51 mL/min/{1.73_m2} — ABNORMAL LOW (ref >=60)

## 2022-01-13 LAB — CBC WITH DIFFERENTIAL/PLATELET
Absolute Monocytes: 507 cells/uL (ref 200–950)
Basophils Absolute: 21 cells/uL (ref 0–200)
Basophils Relative: 0.7 %
Eosinophils Absolute: 90 cells/uL (ref 15–500)
Eosinophils Relative: 3 %
HCT: 40.4 % (ref 35.0–45.0)
Hemoglobin: 13.2 g/dL (ref 11.7–15.5)
Lymphs Abs: 921 cells/uL (ref 850–3900)
MCH: 30.3 pg (ref 27.0–33.0)
MCHC: 32.7 g/dL (ref 32.0–36.0)
MCV: 92.7 fL (ref 80.0–100.0)
MPV: 9.7 fL (ref 7.5–12.5)
Monocytes Relative: 16.9 %
Neutro Abs: 1461 cells/uL — ABNORMAL LOW (ref 1500–7800)
Neutrophils Relative %: 48.7 %
Platelets: 213 10*3/uL (ref 140–400)
RBC: 4.36 10*6/uL (ref 3.80–5.10)
RDW: 13.5 % (ref 11.0–15.0)
Total Lymphocyte: 30.7 %
WBC: 3 10*3/uL — ABNORMAL LOW (ref 3.8–10.8)

## 2022-01-13 LAB — LIPID PANEL
Cholesterol: 132 mg/dL (ref <200)
HDL: 30 mg/dL — ABNORMAL LOW (ref >=50)
LDL Cholesterol (Calc): 79 mg/dL (calc)
Non-HDL Cholesterol (Calc): 102 mg/dL (calc) (ref <130)
Total CHOL/HDL Ratio: 4.4 (calc) (ref <5.0)
Triglycerides: 136 mg/dL (ref <150)

## 2022-01-13 LAB — TSH: TSH: 1.32 mIU/L (ref 0.40–4.50)

## 2022-01-13 NOTE — Progress Notes (Signed)
[name] Return Visit  SUBJECTIVE  History of Present Illness: Beth [name] is a 66 y.o. female seen in follow-up for mixed incontinence.  Last visit a pessary was placed for leakage with bending and this has helped her symptoms. Vesicare has been working well. She is leaking about twice per day with urgency, which is an improvement.   Feeling more pressure and heaviness and a shifting in the pelvis (not the pessary shifting). She does feel that she had some improvement in these symptoms with the botox but now has mostly returned.   Now taking magnesium twice a day and having regular bowel movements.   Surgery: s/p Posterior repair, pelvic floor trigger point injection with botox on 09/10/21.  Past Medical History: Patient  has a past medical history of Anxiety, Chronic constipation, CKD (chronic kidney disease), stage III ([place]), Enterocele, History of cervical cancer, History of MRSA infection, History of pulmonary embolus (PE), History of renal cell carcinoma, History of seizure, Incisional hernia, MDD (major depressive disorder), OA (osteoarthritis), OSA on CPAP, Pre-diabetes, Rectocele, RLS (restless legs syndrome) (01/24/2015), Solitary right kidney, SUI (stress urinary incontinence, female), Systemic lupus erythematosus ([place]), Systemic lupus erythematosus ([place]), Vitamin D deficiency, and Wears glasses.   Past Surgical History: She  has a past surgical history that includes Cesarean section (x3   last one 1980); Laparoscopic nephrectomy, hand assisted (Left, 09/19/2017); Colonoscopy; EXPLORATORY LAPAROTOMY MODIFIED RADICAL HYSTERECTOMY WITH PELVIC LYMPHADENECTOMY AND RESECTION MECKEL'S DIVERTICULUM (11-24-1999 dr Aldean Ast  '@[facility]'$ ); COLD KNIFE CERVICAL CONIZATION  (10/07/1999  '@[facility]'$ ); Incisional hernia repair (N/A, 05/23/2019); and Rectocele repair (N/A, 09/10/2021).   Medications: She has a current medication list which includes the following prescription(s): acetaminophen,  alprazolam, azathioprine, bupropion, vitamin d, escitalopram, gabapentin, ibuprofen, magnesium, rosuvastatin, and solifenacin.   Allergies: Patient is allergic to bactrim [sulfamethoxazole-trimethoprim], cymbalta [duloxetine hcl], and acyclovir and related.   Social History: Patient  reports that she has never smoked. She has never used smokeless tobacco. She reports that she does not drink alcohol and does not use drugs.      OBJECTIVE     Physical Exam: Vitals:   01/13/22 1153  BP: 126/78  Pulse: 82    Gen: No apparent distress, A&O x 3.  Detailed Urogynecologic Evaluation:  Normal external genitalia. CST negative. Pessary removed. Speculum exam revealed normal vaginal mucosa without lesion. Tenderness on palpation of pelvic floor muscles. No prolapse noted with split speculum exam. Pessary replaced.   POPQ (12/04/21) POP-Q   -3                                            Aa   -3                                           Ba   -8                                              C    2.5  Gh   5                                            Pb   8                                            tvl    -3                                            Ap   -3                                            Bp                                                  D        ASSESSMENT AND PLAN    Beth [name] is a 66 y.o. with:  1. Overactive bladder   2. SUI (stress urinary incontinence, female)     - Continue with a 2-1/4 in incontinence ring. - For OAB, continue with the vesicare '5mg'$ . Discussed increasing dose but she is concerned about worsening constipation.  - We discussed that she did not get very long term relief from the pelvic floor botox injections (abt 3 months) so may not be useful to repeat injections. Has also tried bupivacaine/ kenalog trigger point injections, vaginal valium and Pelvic PT and had short term relief with all, but  nothing longer term. Advised that she can restart physical therapy again and she will consider this. Also considering repeating botox (she did pay out of pocket for the botox cost).  - No signs of prolapse on today's exam.  - She is also considering a second opinion- advised that we can send referral/ records if needed.   Return 4 months for pessary check.   [name], MD  Time spent: I spent 30 minutes dedicated to the care of this patient on the date of this encounter to include pre-visit review of records, face-to-face time with the patient and post visit documentation.

## 2022-02-01 ENCOUNTER — Telehealth: Payer: Self-pay

## 2022-02-01 ENCOUNTER — Other Ambulatory Visit: Payer: Self-pay | Admitting: Nurse Practitioner

## 2022-02-01 DIAGNOSIS — F419 Anxiety disorder, unspecified: Secondary | ICD-10-CM

## 2022-02-01 MED ORDER — ALPRAZOLAM 0.5 MG PO TABS: ORAL_TABLET | ORAL | 0 refills | Status: DC

## 2022-02-01 NOTE — Telephone Encounter (Signed)
Alprazolam refill request 

## 2022-02-17 ENCOUNTER — Encounter: Payer: Self-pay | Admitting: Nurse Practitioner

## 2022-02-17 ENCOUNTER — Encounter: Payer: BC Managed Care – PPO | Admitting: Adult Health

## 2022-03-05 ENCOUNTER — Other Ambulatory Visit: Payer: Self-pay | Admitting: Physician Assistant

## 2022-03-05 DIAGNOSIS — Z0000 Encounter for general adult medical examination without abnormal findings: Secondary | ICD-10-CM

## 2022-03-08 ENCOUNTER — Encounter: Payer: Self-pay | Admitting: Obstetrics and Gynecology

## 2022-03-08 ENCOUNTER — Encounter: Payer: Self-pay | Admitting: *Deleted

## 2022-03-08 NOTE — Telephone Encounter (Signed)
Pt was contacted and added tot he schedule

## 2022-03-11 ENCOUNTER — Other Ambulatory Visit (HOSPITAL_COMMUNITY)
Admission: RE | Admit: 2022-03-11 | Discharge: 2022-03-11 | Disposition: A | Discharge status: Home / Self Care | Payer: BC Managed Care – PPO | Source: Ambulatory Visit | Attending: Obstetrics and Gynecology | Admitting: Obstetrics and Gynecology

## 2022-03-11 ENCOUNTER — Encounter: Payer: Self-pay | Admitting: Obstetrics and Gynecology

## 2022-03-11 ENCOUNTER — Ambulatory Visit (INDEPENDENT_AMBULATORY_CARE_PROVIDER_SITE_OTHER): Payer: BC Managed Care – PPO | Admitting: Obstetrics and Gynecology

## 2022-03-11 VITALS — BP 188/92 | HR 74

## 2022-03-11 DIAGNOSIS — N898 Other specified noninflammatory disorders of vagina: Secondary | ICD-10-CM | POA: Diagnosis present

## 2022-03-11 DIAGNOSIS — K5904 Chronic idiopathic constipation: Secondary | ICD-10-CM | POA: Diagnosis not present

## 2022-03-11 DIAGNOSIS — N3281 Overactive bladder: Secondary | ICD-10-CM

## 2022-03-11 MED ORDER — SOLIFENACIN SUCCINATE 10 MG PO TABS: 10.0000 mg | ORAL_TABLET | Freq: Every day | ORAL | 11 refills | Status: DC

## 2022-03-11 MED ORDER — GLYCERIN (ADULT) 2 G RE SUPP: 1.0000 | RECTAL | 0 refills | Status: DC | PRN

## 2022-03-11 NOTE — Patient Instructions (Signed)
Can use biotene mouthwash for dry mouth as needed

## 2022-03-11 NOTE — Progress Notes (Signed)
[facility] Return Visit  SUBJECTIVE  History of Present Illness: Sabria Florido is a 66 y.o. female seen in follow-up for mixed incontinence.  She feels the pessary has been helping her symptoms. She has not been able to get it out over the last month, she did feel it last week but could not get it. Vesicare is overall working well but waking up increasingly at night.  Has been having dry mouth.   Has not had a BM in about a month. She is taking miralax. Has taken Linzess the last few days without relief.   Surgery: s/p Posterior repair, pelvic floor trigger point injection with botox on 09/10/21.  Past Medical History: Patient  has a past medical history of Anxiety, Chronic constipation, CKD (chronic kidney disease), stage III ([place]), Enterocele, History of cervical cancer, History of MRSA infection, History of pulmonary embolus (PE), History of renal cell carcinoma, History of seizure, Incisional hernia, MDD (major depressive disorder), OA (osteoarthritis), OSA on CPAP, Pre-diabetes, Rectocele, RLS (restless legs syndrome) (01/24/2015), Solitary right kidney, SUI (stress urinary incontinence, female), Systemic lupus erythematosus ([place]), Systemic lupus erythematosus ([place]), Vitamin D deficiency, and Wears glasses.   Past Surgical History: She  has a past surgical history that includes Cesarean section (x3   last one 1980); Laparoscopic nephrectomy, hand assisted (Left, 09/19/2017); Colonoscopy; EXPLORATORY LAPAROTOMY MODIFIED RADICAL HYSTERECTOMY WITH PELVIC LYMPHADENECTOMY AND RESECTION MECKEL'S DIVERTICULUM (11-24-1999 dr Aldean Ast  '@[facility]'$ ); COLD KNIFE CERVICAL CONIZATION  (10/07/1999  '@[facility] ); Incisional hernia repair (N/A, 05/23/2019); and Rectocele repair (N/A, 09/10/2021).   Medications: She has a current medication list which includes the following prescription(s): acetaminophen, alprazolam, azathioprine, bupropion, vitamin d, escitalopram, gabapentin, glycerin adult, ibuprofen,  magnesium, rosuvastatin, and solifenacin.   Allergies: Patient is allergic to bactrim [sulfamethoxazole-trimethoprim], cymbalta [duloxetine hcl], and acyclovir and related.   Social History: Patient  reports that she has never smoked. She has never used smokeless tobacco. She reports that she does not drink alcohol and does not use drugs.      OBJECTIVE     Physical Exam: Vitals:   03/11/22 0824  BP: (!) 188/92  Pulse: 74     Gen: No apparent distress, A&O x 3.  Detailed Urogynecologic Evaluation:  Normal external genitalia. No pessary noted. Speculum exam revealed normal vaginal mucosa and confirmed no pessary present. White vaginal discharge present, aptima swab obtained.  New 2-1/4in incontinence ring pessary placed (Lot# [id number]). Also placed string to help with removal.   POPQ (12/04/21) POP-Q   -3                                            Aa   -3                                           Ba   -8                                              C    2.5  Gh   5                                            Pb   8                                            tvl    -3                                            Ap   -3                                            Bp                                                  D        ASSESSMENT AND PLAN    Ms. Dangerfield is a 66 y.o. with:  1. Overactive bladder   2. Chronic idiopathic constipation   3. Vaginal discharge      - Was not aware that pessary fell out. Replaced with new 2-1/4 in incontinence ring. If she is not comfortable removing on her own, will have her come q3 months for cleanings.  - For OAB, will increase dose of vesicare to '10mg'$ . If dry mouth increases, then we can reduce the dose again. Advised her to use biotene mouth wash.  - Provided with Rx for glycerin suppositories to use if she has not success with linzess for BM.   Return 3 months for pessary check.    [name], MD

## 2022-03-12 LAB — CERVICOVAGINAL ANCILLARY ONLY
Bacterial Vaginitis (gardnerella): NEGATIVE
Candida Glabrata: NEGATIVE
Candida Vaginitis: POSITIVE — AB
Comment: NEGATIVE
Comment: NEGATIVE
Comment: NEGATIVE

## 2022-03-12 MED ORDER — FLUCONAZOLE 150 MG PO TABS: 150.0000 mg | ORAL_TABLET | Freq: Once | ORAL | 0 refills | Status: AC

## 2022-03-12 NOTE — Addendum Note (Signed)
Addended by: [name] on: 03/12/2022 04:58 PM   Modules accepted: Orders

## 2022-03-18 ENCOUNTER — Encounter: Payer: BC Managed Care – PPO | Admitting: Nurse Practitioner

## 2022-03-23 ENCOUNTER — Ambulatory Visit (HOSPITAL_COMMUNITY)
Admission: RE | Admit: 2022-03-23 | Discharge: 2022-03-23 | Disposition: A | Discharge status: Home / Self Care | Payer: BC Managed Care – PPO | Source: Ambulatory Visit | Attending: Urology | Admitting: Urology

## 2022-03-23 ENCOUNTER — Other Ambulatory Visit (HOSPITAL_COMMUNITY): Payer: Self-pay | Admitting: Urology

## 2022-03-23 DIAGNOSIS — C642 Malignant neoplasm of left kidney, except renal pelvis: Secondary | ICD-10-CM | POA: Insufficient documentation

## 2022-03-24 ENCOUNTER — Other Ambulatory Visit: Payer: Self-pay

## 2022-03-24 DIAGNOSIS — F3341 Major depressive disorder, recurrent, in partial remission: Secondary | ICD-10-CM

## 2022-03-24 DIAGNOSIS — Z79899 Other long term (current) drug therapy: Secondary | ICD-10-CM

## 2022-03-24 DIAGNOSIS — F419 Anxiety disorder, unspecified: Secondary | ICD-10-CM

## 2022-03-24 MED ORDER — ESCITALOPRAM OXALATE 20 MG PO TABS: ORAL_TABLET | ORAL | 1 refills | Status: DC

## 2022-03-24 MED ORDER — BUPROPION HCL ER (XL) 150 MG PO TB24: ORAL_TABLET | ORAL | 1 refills | Status: AC

## 2022-04-06 ENCOUNTER — Emergency Department (HOSPITAL_COMMUNITY): Payer: BC Managed Care – PPO

## 2022-04-06 ENCOUNTER — Other Ambulatory Visit: Payer: Self-pay

## 2022-04-06 ENCOUNTER — Emergency Department (HOSPITAL_COMMUNITY)
Admission: EM | Admit: 2022-04-06 | Discharge: 2022-04-06 | Disposition: A | Discharge status: Home / Self Care | Payer: BC Managed Care – PPO | Attending: Emergency Medicine | Admitting: Emergency Medicine

## 2022-04-06 DIAGNOSIS — K219 Gastro-esophageal reflux disease without esophagitis: Secondary | ICD-10-CM | POA: Diagnosis not present

## 2022-04-06 DIAGNOSIS — R1310 Dysphagia, unspecified: Secondary | ICD-10-CM | POA: Diagnosis present

## 2022-04-06 DIAGNOSIS — K5900 Constipation, unspecified: Secondary | ICD-10-CM | POA: Insufficient documentation

## 2022-04-06 LAB — CBC
HCT: 44.8 % (ref 36.0–46.0)
Hemoglobin: 14.1 g/dL (ref 12.0–15.0)
MCH: 29.4 pg (ref 26.0–34.0)
MCHC: 31.5 g/dL (ref 30.0–36.0)
MCV: 93.5 fL (ref 80.0–100.0)
Platelets: 247 10*3/uL (ref 150–400)
RBC: 4.79 MIL/uL (ref 3.87–5.11)
RDW: 14.6 % (ref 11.5–15.5)
WBC: 3.8 10*3/uL — ABNORMAL LOW (ref 4.0–10.5)
nRBC: 0 % (ref 0.0–0.2)

## 2022-04-06 LAB — COMPREHENSIVE METABOLIC PANEL
ALT: 25 U/L (ref 0–44)
AST: 31 U/L (ref 15–41)
Albumin: 4.1 g/dL (ref 3.5–5.0)
Alkaline Phosphatase: 59 U/L (ref 38–126)
Anion gap: 8 (ref 5–15)
BUN: 22 mg/dL (ref 8–23)
CO2: 24 mmol/L (ref 22–32)
Calcium: 9.7 mg/dL (ref 8.9–10.3)
Chloride: 106 mmol/L (ref 98–111)
Creatinine, Ser: 1.19 mg/dL — ABNORMAL HIGH (ref 0.44–1.00)
GFR, Estimated: 50 mL/min — ABNORMAL LOW (ref >60)
Glucose, Bld: 131 mg/dL — ABNORMAL HIGH (ref 70–99)
Potassium: 4.4 mmol/L (ref 3.5–5.1)
Sodium: 138 mmol/L (ref 135–145)
Total Bilirubin: 0.6 mg/dL (ref 0.3–1.2)
Total Protein: 8.8 g/dL — ABNORMAL HIGH (ref 6.5–8.1)

## 2022-04-06 LAB — LIPASE, BLOOD: Lipase: 45 U/L (ref 11–51)

## 2022-04-06 MED ORDER — PANTOPRAZOLE SODIUM 20 MG PO TBEC: 20.0000 mg | DELAYED_RELEASE_TABLET | Freq: Two times a day (BID) | ORAL | 0 refills | Status: DC

## 2022-04-06 MED ORDER — PANTOPRAZOLE SODIUM 40 MG PO TBEC
40.0000 mg | DELAYED_RELEASE_TABLET | Freq: Once | ORAL | Status: AC
  Administered 2022-04-06: 40 mg via ORAL
  Filled 2022-04-06: qty 1

## 2022-04-06 NOTE — ED Provider Notes (Signed)
[facility] DEPT Provider Note   CSN: [id number] Arrival date & time: 04/06/22  5956     History  Chief Complaint  Patient presents with   Constipation    Beth [name] is a 66 y.o. female.   Constipation    Patient presents to the ED for 2 separate complaints.  Patient states she has been having some discomfort in her chest with swallowing.  She does not feel like food is getting stuck but has had a burning sensation in addition to the uncomfortable sensation when she swallows.  She does not have any vomiting.  She does not have any abdominal pain.  She is not having any chest pain or shortness of breath.  Patient also has been feeling constipated.  She had not had a normal bowel movement around 10 days.  Patient had tried over-the-counter medications at home.  She ended up trying some castor oil as well and prior to my evaluation she ended up having normal bowel movements and feels like that has resolved.  Home Medications Prior to Admission medications   Medication Sig Start Date End Date Taking? Authorizing Provider  pantoprazole (PROTONIX) 20 MG tablet Take 1 tablet (20 mg total) by mouth 2 (two) times daily for 10 days. 04/06/22 04/16/22 Yes Dorie Rank, MD  acetaminophen (TYLENOL) 500 MG tablet Take 1,000 mg by mouth every 6 (six) hours as needed for mild pain.    [provider]  ALPRAZolam (XANAX) 0.5 MG tablet TAKE 1/2-1 TABLET 1-2 TIMES A DAY ONLY IF NEEDED FOR PANIC/ANXIETY ATTACK. LIMIT TO MAX 5 DAYS/WEEK TO AVOID ADDICTION & DEMENTIA. CONTINUE TO TAPER DOWN ON USE WITH GOAL TO STOP THIS MEDICATION. STRENGTH: 0.5 MG 02/01/22   Darrol Jump, NP  azaTHIOprine (IMURAN) 50 MG tablet Take 50 mg by mouth 2 (two) times daily. 06/26/20   [provider]  buPROPion (WELLBUTRIN XL) 150 MG 24 hr tablet TAKE 1 TABLET EVERY MORNING FOR MOOD, FOCUS & CONCENTRATION 03/24/22   Alycia Rossetti, NP  Cholecalciferol (VITAMIN D) 2000 UNITS  CAPS Take 1 capsule by mouth daily.    [provider]  escitalopram (LEXAPRO) 20 MG tablet TAKE 1.5 TABLETS (30 MG) DAILY FOR MOOD & CHRONIC ANXIETY 03/24/22   Alycia Rossetti, NP  gabapentin (NEURONTIN) 300 MG capsule TAKE 1-2 CAPS AT NIGHT AS NEEDED FOR ANXIETY, PAIN AND SLEEP. 08/04/21   [name], NP  glycerin adult 2 g suppository Place 1 suppository rectally as needed for constipation. 03/11/22   [name], MD  ibuprofen (ADVIL) 600 MG tablet Take 1 tablet (600 mg total) by mouth every 6 (six) hours as needed. Patient taking differently: Take 600 mg by mouth every 6 (six) hours as needed. Ordered for post-op 09/03/21   [name], MD  Magnesium 500 MG TABS Take 500 mg by mouth daily.     [provider]  rosuvastatin (CRESTOR) 5 MG tablet Take 1 tab daily for cholesterol to reduce risk of heart attack/stroke. 09/03/21   [name], NP  solifenacin (VESICARE) 10 MG tablet Take 1 tablet (10 mg total) by mouth daily. 03/11/22   [name], MD      Allergies    Bactrim [sulfamethoxazole-trimethoprim], Cymbalta [duloxetine hcl], and Acyclovir and related    Review of Systems   Review of Systems  Gastrointestinal:  Positive for constipation.    Physical Exam Updated Vital Signs BP (!) 140/82   Pulse 86   Temp 98.1 F (36.7  C) (Oral)   Resp 19   Ht 1.562 m (5' 1.5")   Wt 91.2 kg   SpO2 100%   BMI 37.36 kg/m  Physical Exam Vitals and nursing note reviewed.  Constitutional:      General: She is not in acute distress.    Appearance: She is well-developed.  HENT:     Head: Normocephalic and atraumatic.     Right Ear: External ear normal.     Left Ear: External ear normal.  Eyes:     General: No scleral icterus.       Right eye: No discharge.        Left eye: No discharge.     Conjunctiva/sclera: Conjunctivae normal.  Neck:     Trachea: No tracheal deviation.  Cardiovascular:     Rate and Rhythm: Normal rate and  regular rhythm.  Pulmonary:     Effort: Pulmonary effort is normal. No respiratory distress.     Breath sounds: Normal breath sounds. No stridor. No wheezing or rales.  Abdominal:     General: Bowel sounds are normal. There is no distension.     Palpations: Abdomen is soft.     Tenderness: There is no abdominal tenderness. There is no guarding or rebound.  Musculoskeletal:        General: No tenderness or deformity.     Cervical back: Neck supple.  Skin:    General: Skin is warm and dry.     Findings: No rash.  Neurological:     General: No focal deficit present.     Mental Status: She is alert.     Cranial Nerves: No cranial nerve deficit (no facial droop, extraocular movements intact, no slurred speech).     Sensory: No sensory deficit.     Motor: No abnormal muscle tone or seizure activity.     Coordination: Coordination normal.  Psychiatric:        Mood and Affect: Mood normal.     ED Results / Procedures / Treatments   Labs (all labs ordered are listed, but only abnormal results are displayed) Labs Reviewed  COMPREHENSIVE METABOLIC PANEL - Abnormal; Notable for the following components:      Result Value   Glucose, Bld 131 (*)    Creatinine, Ser 1.19 (*)    Total Protein 8.8 (*)    GFR, Estimated 50 (*)    All other components within normal limits  CBC - Abnormal; Notable for the following components:   WBC 3.8 (*)    All other components within normal limits  LIPASE, BLOOD  URINALYSIS, ROUTINE W REFLEX MICROSCOPIC    EKG None  Radiology DG Chest 2 View  Result Date: 04/06/2022 CLINICAL DATA:  Dysphagia. EXAM: CHEST - 2 VIEW COMPARISON:  Chest x-ray dated March 23, 2022. FINDINGS: The heart size and mediastinal contours are within normal limits. Both lungs are clear. The visualized skeletal structures are unremarkable. IMPRESSION: No active cardiopulmonary disease. Electronically Signed   By: Titus Dubin M.D.   On: 04/06/2022 18:10     Procedures Procedures    Medications Ordered in ED Medications  pantoprazole (PROTONIX) EC tablet 40 mg (40 mg Oral Given 04/06/22 1838)    ED Course/ Medical Decision Making/ A&P Clinical Course as of 04/06/22 1932  Tue Apr 06, 2022  1747 Pt had a CT scan abd pelvis nov 11 that showed increasing size of lymph nodes [JK]  1820 Chest x-ray without acute findings [JK]  1931 Laboratory tests unremarkable.  Decreased  white blood cell, unchanged.  Metabolic panel lipase normal. [JK]    Clinical Course User Index [JK] Dorie Rank, MD                           Medical Decision Making Problems Addressed: Constipation, unspecified constipation type: acute illness or injury Gastroesophageal reflux disease, unspecified whether esophagitis present: acute illness or injury  Amount and/or Complexity of Data Reviewed Labs: ordered. Decision-making details documented in ED Course. Radiology: ordered and independent interpretation performed.  Risk Prescription drug management.   Patient presented to ED with complaints of some discomfort with swallowing.  She has no oropharyngeal lesions.  Symptoms are suggestive of possible GERD she has had some burning discomfort.  Patient's not having any vomiting.  She is not having abdominal pain.  No findings to suggest hepatitis pancreatitis or bowel obstruction.  Patient had been complaining of some constipation but this all resolved prior to my evaluation with over-the-counter laxatives.  Patient had a recent CT scan that showed lymphadenopathy.  She has plans to follow-up on that.  At this time I suspect her symptoms are related to acid reflux.  We will try a course of antacids.  Outpatient follow-up with GI.        Final Clinical Impression(s) / ED Diagnoses Final diagnoses:  Gastroesophageal reflux disease, unspecified whether esophagitis present  Constipation, unspecified constipation type    Rx / DC Orders ED Discharge Orders           Ordered    pantoprazole (PROTONIX) 20 MG tablet  2 times daily        04/06/22 1850              Dorie Rank, MD 04/06/22 1932

## 2022-04-06 NOTE — Discharge Instructions (Signed)
Continue your current regimen for your constipation.  Take The antacid as prescribed.  Consider following up with a GI doctor or your primary care doctor for further evaluation

## 2022-04-06 NOTE — ED Triage Notes (Signed)
Pt states she had not had a BM for about 10 days. States she has tried suppositories with no relief. States today she tried castor oil. Pt had BM about 10 minutes ago. Pt reports still feeling like pain in her back after swallowing.

## 2022-04-12 ENCOUNTER — Telehealth: Payer: Self-pay

## 2022-04-12 NOTE — Progress Notes (Signed)
[place] [phone]   [phone]  INITIAL CONSULTATION:  Patient Care Team: Olen Cordial, PA as PCP - General (Physician Assistant) Signe Colt, MD as Referring Physician (Obstetrics and Gynecology) Kristeen Miss, MD as Consulting Physician (Neurosurgery) Hennie Duos, MD as Consulting Physician (Rheumatology) Efrain Sella, MD as Referring Physician (Internal Medicine) Ara Kussmaul, MD as Consulting Physician (Ophthalmology) Bjorn Loser, MD as Consulting Physician (Urology)  CHIEF COMPLAINTS/PURPOSE OF CONSULTATION:  Lymphadenopathy  HISTORY OF PRESENTING ILLNESS:  Beth [name] 66 y.o. female with medical history significant for cervical cancer, renal cell carcinoma, pulmonary embolus, lupus and OSA presents to the diagnostic clinic for new pelvic lymphadenopathy. She is accompanied by her friend for this visit.   On review of the previous records, Ms. Patron underwent surveillance CT scan on 04/01/2022 due to history of pT1b renal cell carcinoma status post laparascopic nephrectomy on 09/19/2017. Findings revealed increased size of bilateral external iliac and inguinal lymph nodes.   On exam today, Ms. Molden reports having some fatigue but she is able to complete all her daily activities on her own. She has lost approximately six pounds over the last 3 months. The weight loss was intentional and achieved through diet modifications. She denies nausea, vomiting or abdominal pain. She was experiencing constipation but that has improved with caster oil. She plans to replace caster oil with another stool softener. She does complain of rectal pain, even when she is not having a bowel movement. She denies easy bruising or signs of active bleeding. She denies fevers, chills, sweats, shortness of breath, chest pain or cough. She has no other complaints. Rest of the 10 point ROS is below.   MEDICAL  HISTORY:  Past Medical History:  Diagnosis Date   Anxiety    Chronic constipation    followed by dr Carlean Purl   CKD (chronic kidney disease), stage III ([place])    Enterocele    History of cervical cancer    hx squamous cell cervical carcinoma,   s/p  radical hysterectomy 11-24-1999   History of MRSA infection    2005--- buttock abscess   History of pulmonary embolus (PE)    post op 10-09-2017 (surgery on 09-19-2017)  lower range pulmonary artery,  resolved and completed 6 month Eliquis   History of renal cell carcinoma    s/p  left nephrectomy 09-19-2017  (oncologist -- dr Alen Blew / urologsit-- dr bell)   History of seizure    10/ 2001  one episode per discharge note in epic, 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 (pt had just been dx with lupus);  (05-21-2019 per pt none since)   Incisional hernia    MDD (major depressive disorder)    OA (osteoarthritis)    OSA on CPAP    followed by dr dohmeier   Pre-diabetes    Rectocele    RLS (restless legs syndrome) 01/24/2015   Per sleep study 05/2014    Solitary right kidney    acquired 09-19-2017 s/p left nephrectomy   SUI (stress urinary incontinence, female)    05-21-2019  improved with therapy   Systemic lupus erythematosus ([place])    rheumotologist--- dr Amil Amen (lov 03-19-2019 in epic)   Systemic lupus erythematosus ([place])    Vitamin D deficiency    Wears glasses     SURGICAL HISTORY: Past Surgical History:  Procedure Laterality Date   CESAREAN SECTION  x3   last one [place]  CONIZATION   10/07/1999  _0    COLONOSCOPY     negative per pt, Dr. Alice Reichert - [facility]   EXPLORATORY LAPAROTOMY MODIFIED RADICAL HYSTERECTOMY WITH PELVIC LYMPHADENECTOMY AND RESECTION MECKEL'S DIVERTICULUM  11-24-1999 dr Aldean Ast  _1    [place] N/A 05/23/2019   Procedure: LAPAROSCOPIC INCISIONAL HERNIA REPAIR WITH MESH;  Surgeon: Kinsinger, Arta Bruce, MD;  Location: [facility];   Service: General;  Laterality: N/A;   LAPAROSCOPIC NEPHRECTOMY, HAND ASSISTED Left 09/19/2017   Procedure: LEFT HAND ASSISTED LAPAROSCOPIC NEPHRECTOMY;  Surgeon: Lucas Mallow, MD;  Location[facility];  Service: Urology;  Laterality: Left;   RECTOCELE REPAIR N/A 09/10/2021   Procedure: POSTERIOR REPAIR (RECTOCELE);  Surgeon: [name], MD;  Location: [facility];  Service: Gynecology;  Laterality: N/A;    SOCIAL HISTORY: Social History   Socioeconomic History   Marital status: Single    Spouse name: Not on file   Number of children: 3   Years of education: Not on file   Highest education level: Not on file  Occupational History   Occupation: Network engineer  Tobacco Use   Smoking status: Never   Smokeless tobacco: Never  Vaping Use   Vaping Use: Never used  Substance and Sexual Activity   Alcohol use: No   Drug use: Never   Sexual activity: Not Currently  Other Topics Concern   Not on file  Social History Narrative   Single - 3 children   Secretary [employer]   Never smoker, No ETOH/drugs   Social Determinants of Radio broadcast assistant Strain: Not on file  Food Insecurity: Not on file  Transportation Needs: Not on file  Physical Activity: Not on file  Stress: Not on file  Social Connections: Not on file  Intimate Partner Violence: Not on file    FAMILY HISTORY: Family History  Problem Relation Age of Onset   Hyperlipidemia Mother    Hypertension Mother    Heart disease Mother     ALLERGIES:  is allergic to bactrim [sulfamethoxazole-trimethoprim], cymbalta [duloxetine hcl], and acyclovir and related.  MEDICATIONS:  Current Outpatient Medications  Medication Sig Dispense Refill   acetaminophen (TYLENOL) 500 MG tablet Take 1,000 mg by mouth every 6 (six) hours as needed for mild pain.     ALPRAZolam (XANAX) 0.5 MG tablet TAKE 1/2-1 TABLET 1-2 TIMES A DAY ONLY IF NEEDED FOR PANIC/ANXIETY ATTACK. LIMIT TO MAX 5 DAYS/WEEK TO AVOID ADDICTION  & DEMENTIA. CONTINUE TO TAPER DOWN ON USE WITH GOAL TO STOP THIS MEDICATION. STRENGTH: 0.5 MG 40 tablet 0   azaTHIOprine (IMURAN) 50 MG tablet Take 50 mg by mouth 2 (two) times daily.     buPROPion (WELLBUTRIN XL) 150 MG 24 hr tablet TAKE 1 TABLET EVERY MORNING FOR MOOD, FOCUS & CONCENTRATION 90 tablet 1   Cholecalciferol (VITAMIN D) 2000 UNITS CAPS Take 1 capsule by mouth daily.     escitalopram (LEXAPRO) 20 MG tablet TAKE 1.5 TABLETS (30 MG) DAILY FOR MOOD & CHRONIC ANXIETY 135 tablet 1   gabapentin (NEURONTIN) 300 MG capsule TAKE 1-2 CAPS AT NIGHT AS NEEDED FOR ANXIETY, PAIN AND SLEEP. 180 capsule 3   glycerin adult 2 g suppository Place 1 suppository rectally as needed for constipation. 50 suppository 0   ibuprofen (ADVIL) 600 MG tablet Take 1 tablet (600 mg total) by mouth every 6 (six) hours as needed. (Patient taking differently: Take 600 mg by mouth every 6 (six) hours as needed. Ordered for post-op)  30 tablet 0   Magnesium 500 MG TABS Take 500 mg by mouth daily.      pantoprazole (PROTONIX) 20 MG tablet Take 1 tablet (20 mg total) by mouth 2 (two) times daily for 10 days. 20 tablet 0   rosuvastatin (CRESTOR) 5 MG tablet Take 1 tab daily for cholesterol to reduce risk of heart attack/stroke. 90 tablet 3   solifenacin (VESICARE) 10 MG tablet Take 1 tablet (10 mg total) by mouth daily. 30 tablet 11   No current facility-administered medications for this visit.    REVIEW OF SYSTEMS:   Constitutional: ( - ) fevers, ( - )  chills , ( - ) night sweats Eyes: ( - ) blurriness of vision, ( - ) double vision, ( - ) watery eyes Ears, nose, mouth, throat, and face: ( - ) mucositis, ( - ) sore throat Respiratory: ( - ) cough, ( - ) dyspnea, ( - ) wheezes Cardiovascular: ( - ) palpitation, ( - ) chest discomfort, ( - ) lower extremity swelling Gastrointestinal:  ( - ) nausea, ( - ) heartburn, ( - ) change in bowel habits Skin: ( - ) abnormal skin rashes Lymphatics: ( - ) new lymphadenopathy, ( - )  easy bruising Neurological: ( - ) numbness, ( - ) tingling, ( - ) new weaknesses Behavioral/Psych: ( - ) mood change, ( - ) new changes  All other systems were reviewed with the patient and are negative.  PHYSICAL EXAMINATION: ECOG PERFORMANCE STATUS: 1 - Symptomatic but completely ambulatory  There were no vitals filed for this visit. There were no vitals filed for this visit.  GENERAL: well appearing female in NAD  SKIN: skin color, texture, turgor are normal, no rashes or significant lesions EYES: conjunctiva are pink and non-injected, sclera clear OROPHARYNX: no exudate, no erythema; lips, buccal mucosa, and tongue normal  NECK: supple, non-tender LYMPH:  no palpable lymphadenopathy in the cervical, axillary or supraclavicular lymph nodes.  LUNGS: clear to auscultation and percussion with normal breathing effort HEART: regular rate & rhythm and no murmurs and no lower extremity edema ABDOMEN: soft, non-tender, non-distended, normal bowel sounds Musculoskeletal: no cyanosis of digits and no clubbing  PSYCH: alert & oriented x 3, fluent speech NEURO: no focal motor/sensory deficits RECTAL: external hemorrhoids. No palpable mass appreciated.   LABORATORY DATA:  I have reviewed the data as listed    Latest Ref Rng & Units 04/06/2022    9:11 AM 01/12/2022   11:13 AM 09/02/2021   11:40 AM  CBC  WBC 4.0 - 10.5 K/uL 3.8  3.0  3.9   Hemoglobin 12.0 - 15.0 g/dL 14.1  13.2  13.8   Hematocrit 36.0 - 46.0 % 44.8  40.4  42.1   Platelets 150 - 400 K/uL 247  213  290        Latest Ref Rng & Units 04/06/2022    9:11 AM 01/12/2022   11:13 AM 09/02/2021   11:40 AM  CMP  Glucose 70 - 99 mg/dL 131  78  90   BUN 8 - 23 mg/dL _0 Creatinine 0.44 - 1.00 mg/dL 1.19  1.17  1.01   Sodium 135 - 145 mmol/L 138  140  139   Potassium 3.5 - 5.1 mmol/L 4.4  5.0  4.4   Chloride 98 - 111 mmol/L 106  107  105   CO2 22 - 32 mmol/L _1 Calcium 8.9 - 10.3  mg/dL 9.7  9.9  10.3   Total  Protein 6.5 - 8.1 g/dL 8.8  7.5  7.6   Total Bilirubin 0.3 - 1.2 mg/dL 0.6  0.4  0.4   Alkaline Phos 38 - 126 U/L 59     AST 15 - 41 U/L _0 ALT 0 - 44 U/L _1 RADIOGRAPHIC STUDIES: I have personally reviewed the radiological images as listed and agreed with the findings in the report. DG Chest 2 View  Result Date: 04/06/2022 CLINICAL DATA:  Dysphagia. EXAM: CHEST - 2 VIEW COMPARISON:  Chest x-ray dated March 23, 2022. FINDINGS: The heart size and mediastinal contours are within normal limits. Both lungs are clear. The visualized skeletal structures are unremarkable. IMPRESSION: No active cardiopulmonary disease. Electronically Signed   By: Titus Dubin M.D.   On: 04/06/2022 18:10   DG Chest 2 View  Result Date: 03/25/2022 CLINICAL DATA:  Neoplasm of the LEFT kidney. Coughing for 1 month. History of LEFT nephrectomy in May of 2019. EXAM: CHEST - 2 VIEW COMPARISON:  03/20/2021 FINDINGS: The heart size and mediastinal contours are within normal limits. Both lungs are clear. The visualized skeletal structures are unremarkable. IMPRESSION: No active cardiopulmonary disease. Electronically Signed   By: Nolon Nations M.D.   On: 03/25/2022 16:38    ASSESSMENT & PLAN Beth [name] is a 66 y.o. female who presents to the diagnostic clinic for evaluation of inguinal and iliac lymphadenopathy.   #Inguinal/iliac lymphadenopathy: --Underwent pelvic lymph node dissection in July 2001. Pathology revealed reactive process without evidence of malignancy. .  There is evidence of bilateral external iliac lymph nodes dating back as April 2019. --Most recent CT scan from 03/25/2022 showed increase in size of bilateral external iliac and inguinal lymph nodes.  --Labs today to check CBC, CMP, LDH, flow cytometry, sed rate and c-reactive protein.  --Need core needle biopsy to rule out malignancy. Sent request through interventional radiology.  #Stage pT1b Left renal cell  carcinoma: --Underwent radical nephrectomy on 09/19/2017. 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 Tumor was limited to kidney and all margins negative for tumor.   #Rectal pain: --Likely secondary to hemorrhoids. --Recommend follow up with gastroenterologist.   No orders of the defined types were placed in this encounter.   All questions were answered. The patient knows to call the clinic with any problems, questions or concerns.  I have spent a total of 60 minutes minutes of face-to-face and non-face-to-face time, preparing to see the patient, obtaining and/or reviewing separately obtained history, performing a medically appropriate examination, counseling and educating the patient, ordering tests/procedures, documenting clinical information in the electronic health record,and care coordination.   Dede Query, PA-C Department of Hematology/Oncology [place] at [facility] Phone: [phone]  Patient was seen with Dr. Lorenso Courier  I have read the above note and personally examined the patient. I agree with the assessment and plan as noted above.  Briefly Beth [name] is a 66 year old female with medical history significant for renal cell carcinoma early stage who presents for evaluation of inguinal lymphadenopathy.  At this time lymph nodes appear stable and most likely represent an indolent process, however given their large size and persistence would recommend pursuing biopsy in order to rule out a low-grade/indolent malignancy.  At this time her blood work has been unremarkable.  Would recommend ordering flow cytometry as well as inflammatory markers with ESR  and CRP to rule out laboratory conditions.  If no clear etiology can be discerned on the blood work would recommend pursuing a needle biopsy of 1 of these lesions.  The patient voiced understanding of the plan moving forward.   Ledell Peoples, MD Department of  Hematology/Oncology [place] at [facility] Phone: [phone] Pager: [phone] Email: [email or web address]

## 2022-04-12 NOTE — Telephone Encounter (Signed)
Left message for patient to call back and confirm her appointment on 11/28.

## 2022-04-13 ENCOUNTER — Inpatient Hospital Stay: Payer: BC Managed Care – PPO | Attending: Physician Assistant | Admitting: Physician Assistant

## 2022-04-13 ENCOUNTER — Encounter: Payer: Self-pay | Admitting: Physician Assistant

## 2022-04-13 ENCOUNTER — Inpatient Hospital Stay: Payer: BC Managed Care – PPO

## 2022-04-13 ENCOUNTER — Other Ambulatory Visit: Payer: Self-pay

## 2022-04-13 VITALS — BP 146/91 | HR 75 | Temp 97.5°F | Resp 16 | Wt 203.0 lb

## 2022-04-13 DIAGNOSIS — Z85528 Personal history of other malignant neoplasm of kidney: Secondary | ICD-10-CM | POA: Diagnosis not present

## 2022-04-13 DIAGNOSIS — R590 Localized enlarged lymph nodes: Secondary | ICD-10-CM | POA: Insufficient documentation

## 2022-04-13 DIAGNOSIS — R591 Generalized enlarged lymph nodes: Secondary | ICD-10-CM | POA: Diagnosis not present

## 2022-04-13 DIAGNOSIS — Z0000 Encounter for general adult medical examination without abnormal findings: Secondary | ICD-10-CM

## 2022-04-13 LAB — CMP (CANCER CENTER ONLY)
ALT: 17 U/L (ref 0–44)
AST: 20 U/L (ref 15–41)
Albumin: 4.3 g/dL (ref 3.5–5.0)
Alkaline Phosphatase: 45 U/L (ref 38–126)
Anion gap: 4 — ABNORMAL LOW (ref 5–15)
BUN: 14 mg/dL (ref 8–23)
CO2: 30 mmol/L (ref 22–32)
Calcium: 10.1 mg/dL (ref 8.9–10.3)
Chloride: 106 mmol/L (ref 98–111)
Creatinine: 1.17 mg/dL — ABNORMAL HIGH (ref 0.44–1.00)
GFR, Estimated: 51 mL/min — ABNORMAL LOW (ref >60)
Glucose, Bld: 87 mg/dL (ref 70–99)
Potassium: 4.6 mmol/L (ref 3.5–5.1)
Sodium: 140 mmol/L (ref 135–145)
Total Bilirubin: 0.4 mg/dL (ref 0.3–1.2)
Total Protein: 8.5 g/dL — ABNORMAL HIGH (ref 6.5–8.1)

## 2022-04-13 LAB — CBC WITH DIFFERENTIAL (CANCER CENTER ONLY)
Abs Immature Granulocytes: 0.01 10*3/uL (ref 0.00–0.07)
Basophils Absolute: 0 10*3/uL (ref 0.0–0.1)
Basophils Relative: 1 %
Eosinophils Absolute: 0 10*3/uL (ref 0.0–0.5)
Eosinophils Relative: 1 %
HCT: 42.4 % (ref 36.0–46.0)
Hemoglobin: 13.6 g/dL (ref 12.0–15.0)
Immature Granulocytes: 0 %
Lymphocytes Relative: 24 %
Lymphs Abs: 0.9 10*3/uL (ref 0.7–4.0)
MCH: 29.4 pg (ref 26.0–34.0)
MCHC: 32.1 g/dL (ref 30.0–36.0)
MCV: 91.6 fL (ref 80.0–100.0)
Monocytes Absolute: 0.7 10*3/uL (ref 0.1–1.0)
Monocytes Relative: 17 %
Neutro Abs: 2.2 10*3/uL (ref 1.7–7.7)
Neutrophils Relative %: 57 %
Platelet Count: 211 10*3/uL (ref 150–400)
RBC: 4.63 MIL/uL (ref 3.87–5.11)
RDW: 14.5 % (ref 11.5–15.5)
WBC Count: 3.8 10*3/uL — ABNORMAL LOW (ref 4.0–10.5)
nRBC: 0 % (ref 0.0–0.2)

## 2022-04-13 LAB — SEDIMENTATION RATE: Sed Rate: 37 mm/hr — ABNORMAL HIGH (ref 0–22)

## 2022-04-13 LAB — LACTATE DEHYDROGENASE: LDH: 146 U/L (ref 98–192)

## 2022-04-13 LAB — C-REACTIVE PROTEIN: CRP: 0.6 mg/dL (ref <1.0)

## 2022-04-14 ENCOUNTER — Other Ambulatory Visit: Payer: Self-pay

## 2022-04-14 ENCOUNTER — Ambulatory Visit
Admission: RE | Admit: 2022-04-14 | Discharge: 2022-04-14 | Disposition: A | Discharge status: Home / Self Care | Payer: Self-pay | Source: Ambulatory Visit | Attending: Physician Assistant | Admitting: Physician Assistant

## 2022-04-14 ENCOUNTER — Encounter: Payer: Self-pay | Admitting: General Practice

## 2022-04-14 DIAGNOSIS — Z85528 Personal history of other malignant neoplasm of kidney: Secondary | ICD-10-CM

## 2022-04-14 DIAGNOSIS — R591 Generalized enlarged lymph nodes: Secondary | ICD-10-CM

## 2022-04-14 NOTE — Progress Notes (Signed)
Suttle, Rosanne Ashing, MD  Allen Kell, NT; Dede Query T, PA-C Inguinal and iliac lymph nodes are prominent but grossly stable since 2019, strongly suggestive of benign, chronically reactive etiology.  No indication for biopsy.    PET CT could be considered for further evaluation if needed.  Dylan       Previous Messages    ----- Message ----- From: Allen Kell, NT Sent: 04/14/2022   1:18 PM EST To: Ir Procedure Requests Subject: CT Biopsy                                      Procedure:  CT Biopsy  Reason: inguinal and illiac lymphadenopathy. History of renal and cervical cancer  History: CT in PACS  Provider: Lincoln Brigham, PA-C  Contact:  [phone]

## 2022-04-15 ENCOUNTER — Encounter: Payer: Self-pay | Admitting: General Practice

## 2022-04-15 NOTE — Progress Notes (Signed)
Suttle, Rosanne Ashing, MD  Orson Slick, MD; Allen Kell, NT Dr. Lorenso Courier,  Seems reasonable, we can definitely sample the nodes easily.  We'll get her set up for ultrasound guided inguinal lymph node biopsy.  Thanks,  Dylan       Previous Messages    ----- Message ----- From: Orson Slick, MD Sent: 04/15/2022   9:10 AM EST To: Suzette Battiest, MD  Hey Dr. Serafina Royals,  This young lady that we referred her for biopsy of modestly enlarged inguinal lymph nodes.  It would be helpful to get this biopsy to help rule out slow-growing/indolent lymphomas and determine whether or not she needs to be followed by oncology clinic versus her PCP.  The patient is also quite anxious and I think a biopsy would help alleviate those concerns.  Please let me know if you think a biopsy could be feasible.  Best,  Armandina Gemma, MD Hematology/Oncology

## 2022-04-16 LAB — SURGICAL PATHOLOGY

## 2022-04-19 ENCOUNTER — Encounter: Payer: BC Managed Care – PPO | Admitting: Nurse Practitioner

## 2022-04-19 LAB — FLOW CYTOMETRY

## 2022-04-22 ENCOUNTER — Other Ambulatory Visit: Payer: Self-pay | Admitting: Radiology

## 2022-04-22 DIAGNOSIS — R591 Generalized enlarged lymph nodes: Secondary | ICD-10-CM

## 2022-04-23 ENCOUNTER — Other Ambulatory Visit: Payer: Self-pay | Admitting: Radiology

## 2022-04-23 ENCOUNTER — Other Ambulatory Visit: Payer: Self-pay | Admitting: Student

## 2022-04-23 NOTE — H&P (Signed)
Chief Complaint: Patient was seen in consultation today for image-guided lymph node biopsy  Referring Physician(s): Lincoln Brigham  Supervising Physician: {Supervising Physician:[id number]}  Patient Status: [facility] - Out-pt  History of Present Illness: Beth [name] is a 66 y.o. female with a medical history significant for cervical cancer, renal cell carcinoma (s/p left nephrectomy 2019), PE, Lupus and OSA. She presented to the [facility] ED 04/06/22 with complaints of constipation and acid reflux. Records obtained from an outside facility included a surveillance CT scan 04/01/22 due to her history of RCC. CT showed an increased size of bilateral external iliac and inguinal lymph nodes.   The patient was discharged home with GERD medication and she was referred to Hematology/Oncology. At the clinic visit the patient endorsed fatigue and a 6 lb intentional weight loss over the past three months. She has a history of a prior pelvic lymph node dissection July 2001 with pathology revealing a reactive process without evidence of malignancy.  Given her history of prior malignancies Interventional Radiology has been asked to evaluate this patient for an image-guided lymph node biopsy. Imaging reviewed and procedure approved by Dr. Serafina Royals.   Past Medical History:  Diagnosis Date   Anxiety    Chronic constipation    followed by dr Carlean Purl   CKD (chronic kidney disease), stage III ([place])    Enterocele    History of cervical cancer    hx squamous cell cervical carcinoma,   s/p  radical hysterectomy 11-24-1999   History of MRSA infection    2005--- buttock abscess   History of pulmonary embolus (PE)    post op 10-09-2017 (surgery on 09-19-2017)  lower range pulmonary artery,  resolved and completed 6 month Eliquis   History of renal cell carcinoma    s/p  left nephrectomy 09-19-2017  (oncologist -- dr Alen Blew / urologsit-- dr bell)   History of seizure    10/ 2001  one episode per discharge note in epic,  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 (pt had just been dx with lupus);  (05-21-2019 per pt none since)   Incisional hernia    MDD (major depressive disorder)    OA (osteoarthritis)    OSA on CPAP    followed by dr dohmeier   Pre-diabetes    Rectocele    RLS (restless legs syndrome) 01/24/2015   Per sleep study 05/2014    Solitary right kidney    acquired 09-19-2017 s/p left nephrectomy   SUI (stress urinary incontinence, female)    05-21-2019  improved with therapy   Systemic lupus erythematosus ([place])    rheumotologist--- dr Amil Amen (lov 03-19-2019 in epic)   Systemic lupus erythematosus ([place])    Vitamin D deficiency    Wears glasses     Past Surgical History:  Procedure Laterality Date   CESAREAN SECTION  x3   last one 1980   COLD KNIFE CERVICAL CONIZATION   10/07/1999  '@[facility]'$    COLONOSCOPY     negative per pt, Dr. Alice Reichert - [facility]   EXPLORATORY LAPAROTOMY MODIFIED RADICAL HYSTERECTOMY WITH PELVIC LYMPHADENECTOMY AND RESECTION MECKEL'S DIVERTICULUM  11-24-1999 dr Aldean Ast  '@[facility]'$    INCISIONAL HERNIA REPAIR N/A 05/23/2019   Procedure: LAPAROSCOPIC INCISIONAL HERNIA REPAIR WITH MESH;  Surgeon: Kinsinger, Arta Bruce, MD;  Location: [place];  Service: General;  Laterality: N/A;   LAPAROSCOPIC NEPHRECTOMY, HAND ASSISTED Left 09/19/2017   Procedure: LEFT HAND ASSISTED LAPAROSCOPIC NEPHRECTOMY;  Surgeon: Lucas Mallow, MD;  Location[facility];  Service:  Urology;  Laterality: Left;   RECTOCELE REPAIR N/A 09/10/2021   Procedure: POSTERIOR REPAIR (RECTOCELE);  Surgeon: [name], MD;  Location: [facility];  Service: Gynecology;  Laterality: N/A;    Allergies: Bactrim [sulfamethoxazole-trimethoprim], Cymbalta [duloxetine hcl], and Acyclovir and related  Medications: Prior to Admission medications   Medication Sig Start Date End Date Taking? Authorizing Provider  acetaminophen (TYLENOL) 500 MG tablet Take 500 mg by  mouth every 6 (six) hours as needed for mild pain.   Yes [provider]  buPROPion (WELLBUTRIN XL) 300 MG 24 hr tablet Take 300 mg by mouth daily.   Yes [provider]  Cholecalciferol (VITAMIN D) 2000 UNITS CAPS Take 1 capsule by mouth daily.   Yes [provider]  gabapentin (NEURONTIN) 300 MG capsule TAKE 1-2 CAPS AT NIGHT AS NEEDED FOR ANXIETY, PAIN AND SLEEP. 08/04/21  Yes [name], NP  Magnesium 500 MG TABS Take 500 mg by mouth daily.    Yes [provider]  rosuvastatin (CRESTOR) 5 MG tablet Take 1 tab daily for cholesterol to reduce risk of heart attack/stroke. 09/03/21  Yes [name], NP  traZODone (DESYREL) 50 MG tablet Take 50 mg by mouth at bedtime. 04/15/22  Yes [provider]  buPROPion (WELLBUTRIN XL) 150 MG 24 hr tablet TAKE 1 TABLET EVERY MORNING FOR MOOD, FOCUS & CONCENTRATION Patient not taking: Reported on 04/22/2022 03/24/22   Alycia Rossetti, NP     Family History  Problem Relation Age of Onset   Hyperlipidemia Mother    Hypertension Mother    Heart disease Mother     Social History   Socioeconomic History   Marital status: Single    Spouse name: Not on file   Number of children: 3   Years of education: Not on file   Highest education level: Not on file  Occupational History   Occupation: Network engineer  Tobacco Use   Smoking status: Never   Smokeless tobacco: Never  Vaping Use   Vaping Use: Never used  Substance and Sexual Activity   Alcohol use: No   Drug use: Never   Sexual activity: Not Currently  Other Topics Concern   Not on file  Social History Narrative   Single - 3 children   Secretary [employer]   Never smoker, No ETOH/drugs   Social Determinants of Health   Financial Resource Strain: Not on file  Food Insecurity: Not on file  Transportation Needs: Not on file  Physical Activity: Not on file  Stress: Not on file  Social Connections: Not on file    Review of Systems: A 12 point ROS  discussed and pertinent positives are indicated in the HPI above.  All other systems are negative.  Review of Systems  Vital Signs: There were no vitals taken for this visit.  Physical Exam  Imaging: DG Chest 2 View  Result Date: 04/06/2022 CLINICAL DATA:  Dysphagia. EXAM: CHEST - 2 VIEW COMPARISON:  Chest x-ray dated March 23, 2022. FINDINGS: The heart size and mediastinal contours are within normal limits. Both lungs are clear. The visualized skeletal structures are unremarkable. IMPRESSION: No active cardiopulmonary disease. Electronically Signed   By: Titus Dubin M.D.   On: 04/06/2022 18:10    Labs:  CBC: Recent Labs    09/02/21 1140 01/12/22 1113 04/06/22 0911 04/13/22 1301  WBC 3.9 3.0* 3.8* 3.8*  HGB 13.8 13.2 14.1 13.6  HCT 42.1 40.4 44.8 42.4  PLT 290 213 247 211  COAGS: No results for input(s): "INR", "APTT" in the last 8760 hours.  BMP: Recent Labs    09/02/21 1140 01/12/22 1113 04/06/22 0911 04/13/22 1301  NA 139 140 138 140  K 4.4 5.0 4.4 4.6  CL 105 107 106 106  CO2 '25 26 24 30  '$ GLUCOSE 90 78 131* 87  BUN '18 21 22 14  '$ CALCIUM 10.3 9.9 9.7 10.1  CREATININE 1.01 1.17* 1.19* 1.17*  GFRNONAA  --   --  50* 51*    LIVER FUNCTION TESTS: Recent Labs    09/02/21 1140 01/12/22 1113 04/06/22 0911 04/13/22 1301  BILITOT 0.4 0.4 0.6 0.4  AST '16 25 31 20  '$ ALT '13 20 25 17  '$ ALKPHOS  --   --  59 45  PROT 7.6 7.5 8.8* 8.5*  ALBUMIN  --   --  4.1 4.3    TUMOR MARKERS: No results for input(s): "AFPTM", "CEA", "CA199", "CHROMGRNA" in the last 8760 hours.  Assessment and Plan:  Lymphadenopathy; prior history of cervical cancer and renal cell carcinoma: Orland Jarred, 66 year old female, presents today to the [place] [facility] department for an image-guided lymph node biopsy.  Risks and benefits of lymph node biopsy were discussed with the patient and/or patient's family including, but not limited to bleeding, infection,  damage to adjacent structures or low yield requiring additional tests.  All of the questions were answered and there is agreement to proceed. She has been NPO.   Consent signed and in chart.  Thank you for this interesting consult.  I greatly enjoyed meeting Vernette Moise and look forward to participating in their care.  A copy of this report was sent to the requesting provider on this date.  Electronically Signed: Soyla Dryer, AGACNP-BC [phone] 04/23/2022, 8:43 AM   I spent a total of  30 Minutes   in face to face in clinical consultation, greater than 50% of which was counseling/coordinating care for image-guided lymph node biopsy

## 2022-04-26 ENCOUNTER — Other Ambulatory Visit: Payer: Self-pay

## 2022-04-26 ENCOUNTER — Ambulatory Visit (HOSPITAL_COMMUNITY)
Admission: RE | Admit: 2022-04-26 | Discharge: 2022-04-26 | Disposition: A | Discharge status: Home / Self Care | Payer: BC Managed Care – PPO | Source: Ambulatory Visit | Attending: Physician Assistant | Admitting: Physician Assistant

## 2022-04-26 DIAGNOSIS — R591 Generalized enlarged lymph nodes: Secondary | ICD-10-CM | POA: Insufficient documentation

## 2022-04-26 DIAGNOSIS — G4733 Obstructive sleep apnea (adult) (pediatric): Secondary | ICD-10-CM | POA: Diagnosis not present

## 2022-04-26 DIAGNOSIS — Z85528 Personal history of other malignant neoplasm of kidney: Secondary | ICD-10-CM | POA: Insufficient documentation

## 2022-04-26 DIAGNOSIS — K219 Gastro-esophageal reflux disease without esophagitis: Secondary | ICD-10-CM | POA: Insufficient documentation

## 2022-04-26 DIAGNOSIS — Z86711 Personal history of pulmonary embolism: Secondary | ICD-10-CM | POA: Insufficient documentation

## 2022-04-26 LAB — CBC
HCT: 27.6 % — ABNORMAL LOW (ref 36.0–46.0)
Hemoglobin: 8.7 g/dL — ABNORMAL LOW (ref 12.0–15.0)
MCH: 28.2 pg (ref 26.0–34.0)
MCHC: 31.5 g/dL (ref 30.0–36.0)
MCV: 89.6 fL (ref 80.0–100.0)
Platelets: 446 10*3/uL — ABNORMAL HIGH (ref 150–400)
RBC: 3.08 MIL/uL — ABNORMAL LOW (ref 3.87–5.11)
RDW: 24.2 % — ABNORMAL HIGH (ref 11.5–15.5)
WBC: 8.4 10*3/uL (ref 4.0–10.5)
nRBC: 0 % (ref 0.0–0.2)

## 2022-04-26 LAB — GLUCOSE, CAPILLARY: Glucose-Capillary: 128 mg/dL — ABNORMAL HIGH (ref 70–99)

## 2022-04-26 LAB — PROTIME-INR
INR: 1 (ref 0.8–1.2)
Prothrombin Time: 13.1 seconds (ref 11.4–15.2)

## 2022-04-26 MED ORDER — LIDOCAINE HCL (PF) 1 % IJ SOLN
4.0000 mL | Freq: Once | INTRAMUSCULAR | Status: AC
  Administered 2022-04-26: 4 mL via INTRADERMAL

## 2022-04-26 MED ORDER — SODIUM CHLORIDE 0.9 % IV SOLN: INTRAVENOUS | Status: DC

## 2022-04-26 MED ORDER — LIDOCAINE HCL (PF) 1 % IJ SOLN
INTRAMUSCULAR | Status: AC
  Filled 2022-04-26: qty 30

## 2022-04-26 NOTE — Procedures (Signed)
Interventional Radiology Procedure Note  Procedure: Ultrasound guided right inguinal lymph node biopsy  Findings: Please refer to procedural dictation for full description. 16 ga core x 5, samples split between saline and formalin.  Complications: none immediate  Estimated Blood Loss: < 5 mL  Recommendations: Follow up Pathology results.   Ruthann Cancer, MD

## 2022-04-27 LAB — SURGICAL PATHOLOGY

## 2022-05-03 ENCOUNTER — Telehealth: Payer: Self-pay | Admitting: Physician Assistant

## 2022-05-03 ENCOUNTER — Encounter: Payer: Self-pay | Admitting: Physician Assistant

## 2022-05-03 ENCOUNTER — Telehealth: Payer: Self-pay

## 2022-05-03 NOTE — Telephone Encounter (Signed)
Please try to reach patient to let her know that the lymph node biopsy was negative for malignancy. and we do not require any further workup for that. on the day of her biopsy, her hgb dropped significantly...can you figure what happened? any surgery/procedure? bleeding anywhere? menstrual cycle?    6:33 AM Please notify's Dr. Link Snuffer of biopsy results and no need for further workup.   He is her urologist  IT who made the referral to [place]  6:46 AM IT   Pt advised with VU for biopsy.  For drop in Hgb- No surgery/procedure, bleeding or abnormal menstrual cycle

## 2022-05-03 NOTE — Telephone Encounter (Signed)
Called patient per 12/18 secure chat to schedule labs. Patient leaving town for holidays. Patient scheduled for when she's back.

## 2022-05-06 ENCOUNTER — Ambulatory Visit
Admission: RE | Admit: 2022-05-06 | Discharge: 2022-05-06 | Disposition: A | Discharge status: Home / Self Care | Payer: BC Managed Care – PPO | Source: Ambulatory Visit | Attending: Physician Assistant | Admitting: Physician Assistant

## 2022-05-18 ENCOUNTER — Other Ambulatory Visit: Payer: Self-pay | Admitting: Physician Assistant

## 2022-05-18 DIAGNOSIS — D509 Iron deficiency anemia, unspecified: Secondary | ICD-10-CM

## 2022-05-25 ENCOUNTER — Ambulatory Visit: Payer: Self-pay | Admitting: Surgery

## 2022-05-25 ENCOUNTER — Other Ambulatory Visit: Payer: Self-pay

## 2022-05-25 ENCOUNTER — Inpatient Hospital Stay: Payer: BC Managed Care – PPO | Attending: Physician Assistant

## 2022-05-25 DIAGNOSIS — Z905 Acquired absence of kidney: Secondary | ICD-10-CM | POA: Diagnosis not present

## 2022-05-25 DIAGNOSIS — Z86711 Personal history of pulmonary embolism: Secondary | ICD-10-CM | POA: Diagnosis not present

## 2022-05-25 DIAGNOSIS — Z8541 Personal history of malignant neoplasm of cervix uteri: Secondary | ICD-10-CM | POA: Insufficient documentation

## 2022-05-25 DIAGNOSIS — R591 Generalized enlarged lymph nodes: Secondary | ICD-10-CM | POA: Insufficient documentation

## 2022-05-25 DIAGNOSIS — Z85528 Personal history of other malignant neoplasm of kidney: Secondary | ICD-10-CM | POA: Diagnosis present

## 2022-05-25 DIAGNOSIS — D509 Iron deficiency anemia, unspecified: Secondary | ICD-10-CM

## 2022-05-25 LAB — VITAMIN B12: Vitamin B-12: 491 pg/mL (ref 180–914)

## 2022-05-25 LAB — CBC WITH DIFFERENTIAL (CANCER CENTER ONLY)
Abs Immature Granulocytes: 0.01 10*3/uL (ref 0.00–0.07)
Basophils Absolute: 0 10*3/uL (ref 0.0–0.1)
Basophils Relative: 1 %
Eosinophils Absolute: 0.1 10*3/uL (ref 0.0–0.5)
Eosinophils Relative: 2 %
HCT: 38.5 % (ref 36.0–46.0)
Hemoglobin: 13 g/dL (ref 12.0–15.0)
Immature Granulocytes: 0 %
Lymphocytes Relative: 24 %
Lymphs Abs: 0.8 10*3/uL (ref 0.7–4.0)
MCH: 30.3 pg (ref 26.0–34.0)
MCHC: 33.8 g/dL (ref 30.0–36.0)
MCV: 89.7 fL (ref 80.0–100.0)
Monocytes Absolute: 0.5 10*3/uL (ref 0.1–1.0)
Monocytes Relative: 16 %
Neutro Abs: 1.8 10*3/uL (ref 1.7–7.7)
Neutrophils Relative %: 57 %
Platelet Count: 212 10*3/uL (ref 150–400)
RBC: 4.29 MIL/uL (ref 3.87–5.11)
RDW: 14.7 % (ref 11.5–15.5)
WBC Count: 3.3 10*3/uL — ABNORMAL LOW (ref 4.0–10.5)
nRBC: 0 % (ref 0.0–0.2)

## 2022-05-25 LAB — IRON AND IRON BINDING CAPACITY (CC-WL,HP ONLY)
Iron: 66 ug/dL (ref 28–170)
Saturation Ratios: 20 % (ref 10.4–31.8)
TIBC: 325 ug/dL (ref 250–450)
UIBC: 259 ug/dL (ref 148–442)

## 2022-05-25 LAB — CMP (CANCER CENTER ONLY)
ALT: 20 U/L (ref 0–44)
AST: 22 U/L (ref 15–41)
Albumin: 3.9 g/dL (ref 3.5–5.0)
Alkaline Phosphatase: 46 U/L (ref 38–126)
Anion gap: 6 (ref 5–15)
BUN: 18 mg/dL (ref 8–23)
CO2: 25 mmol/L (ref 22–32)
Calcium: 9.8 mg/dL (ref 8.9–10.3)
Chloride: 109 mmol/L (ref 98–111)
Creatinine: 0.94 mg/dL (ref 0.44–1.00)
GFR, Estimated: >60 mL/min (ref >60)
Glucose, Bld: 124 mg/dL — ABNORMAL HIGH (ref 70–99)
Potassium: 4.5 mmol/L (ref 3.5–5.1)
Sodium: 140 mmol/L (ref 135–145)
Total Bilirubin: 0.3 mg/dL (ref 0.3–1.2)
Total Protein: 7.6 g/dL (ref 6.5–8.1)

## 2022-05-25 LAB — RETIC PANEL
Immature Retic Fract: 15.4 % (ref 2.3–15.9)
RBC.: 4.3 MIL/uL (ref 3.87–5.11)
Retic Count, Absolute: 72.7 10*3/uL (ref 19.0–186.0)
Retic Ct Pct: 1.7 % (ref 0.4–3.1)
Reticulocyte Hemoglobin: 32.7 pg (ref >27.9)

## 2022-05-25 LAB — FERRITIN: Ferritin: 128 ng/mL (ref 11–307)

## 2022-05-25 LAB — SAMPLE TO BLOOD BANK

## 2022-05-25 LAB — FOLATE: Folate: 7.7 ng/mL (ref >5.9)

## 2022-05-26 ENCOUNTER — Ambulatory Visit: Payer: BC Managed Care – PPO | Admitting: Obstetrics and Gynecology

## 2022-05-27 ENCOUNTER — Telehealth: Payer: Self-pay

## 2022-05-27 NOTE — Telephone Encounter (Signed)
-----   Message from Lincoln Brigham, PA-C sent at 05/27/2022  2:16 PM EST ----- Please notify patient that her Hgb has resolved and rest of the workup was negative. No further evaluation is required at this time.    ----- Message ----- From: Buel Ream, [facility] [place] Sent: 05/25/2022   8:38 AM EST To: Lincoln Brigham, PA-C

## 2022-05-27 NOTE — Telephone Encounter (Signed)
LM for pt with results.  

## 2022-06-10 ENCOUNTER — Encounter: Payer: Self-pay | Admitting: Internal Medicine

## 2022-06-17 HISTORY — PX: EXCISIONAL HEMORRHOIDECTOMY: SHX1541

## 2022-06-22 ENCOUNTER — Ambulatory Visit (AMBULATORY_SURGERY_CENTER): Payer: BC Managed Care – PPO | Admitting: *Deleted

## 2022-06-22 VITALS — Ht 61.5 in | Wt 203.0 lb

## 2022-06-22 DIAGNOSIS — Z1211 Encounter for screening for malignant neoplasm of colon: Secondary | ICD-10-CM

## 2022-06-22 MED ORDER — PEG 3350-KCL-NA BICARB-NACL 420 G PO SOLR: 4000.0000 mL | Freq: Once | ORAL | 0 refills | Status: AC

## 2022-06-22 NOTE — Progress Notes (Signed)
No egg or soy allergy known to patient  No issues known to pt with past sedation with any surgeries or procedures Patient denies ever being told they had issues or difficulty with intubation  No FH of Malignant Hyperthermia Pt is not on diet pills Pt is not on  home 02  Pt is not on blood thinners  Pt has issues with constipation,2 day prep No A fib or A flutter Have any cardiac testing pending--no Pt instructed to use Singlecare.com or GoodRx for a price reduction on prep    Patient's chart reviewed by Osvaldo Angst CNRA prior to previsit and patient appropriate for the [place].  Previsit completed and red dot placed by patient's name on their procedure day (on provider's schedule).

## 2022-06-24 ENCOUNTER — Other Ambulatory Visit: Payer: Self-pay | Admitting: Surgery

## 2022-07-05 ENCOUNTER — Encounter: Payer: Self-pay | Admitting: *Deleted

## 2022-07-14 ENCOUNTER — Encounter: Payer: Self-pay | Admitting: Internal Medicine

## 2022-07-19 ENCOUNTER — Encounter: Payer: BC Managed Care – PPO | Admitting: Gastroenterology

## 2022-07-29 ENCOUNTER — Encounter: Payer: Self-pay | Admitting: Internal Medicine

## 2022-08-03 ENCOUNTER — Encounter: Payer: Self-pay | Admitting: Internal Medicine

## 2022-08-03 ENCOUNTER — Ambulatory Visit (AMBULATORY_SURGERY_CENTER): Payer: BC Managed Care – PPO | Admitting: Internal Medicine

## 2022-08-03 VITALS — BP 122/65 | HR 59 | Temp 98.0°F | Resp 16 | Ht 61.5 in | Wt 203.0 lb

## 2022-08-03 DIAGNOSIS — Z1211 Encounter for screening for malignant neoplasm of colon: Secondary | ICD-10-CM

## 2022-08-03 HISTORY — PX: COLONOSCOPY: SHX174

## 2022-08-03 MED ORDER — SODIUM CHLORIDE 0.9 % IV SOLN: 500.0000 mL | INTRAVENOUS | Status: DC

## 2022-08-03 NOTE — Progress Notes (Unsigned)
[facility] History and Physical   Primary Care Physician:  Olen Cordial, PA   Reason for Procedure:   screening  Plan:    colonoscopy     HPI: Gwendolin Pineault is a 67 y.o. female w/ hx hi tone pelvic floor dysfunction. She is having burning rectal pain after hemorrhoidectomy   Past Medical History:  Diagnosis Date   Anxiety    Cancer ([place])    cervical,kidney   Chronic constipation    followed by dr Carlean Purl   CKD (chronic kidney disease), stage III ([place])    Enterocele    History of cervical cancer    hx squamous cell cervical carcinoma,   s/p  radical hysterectomy 11-24-1999   History of MRSA infection    2005--- buttock abscess   History of pulmonary embolus ([place])    post op 10-09-2017 (surgery on 09-19-2017)  lower range pulmonary artery,  resolved and completed 6 month Eliquis   History of renal cell carcinoma    s/p  left nephrectomy 09-19-2017  (oncologist -- dr Alen Blew / urologsit-- dr bell)   History of seizure    10/ 2001  one episode per discharge note in epic, 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 (pt had just been dx with lupus);  (05-21-2019 per pt none since)   Hyperlipidemia    Incisional hernia    MDD (major depressive disorder)    OA (osteoarthritis)    OSA on CPAP    followed by dr dohmeier   Pre-diabetes    Rectocele    RLS (restless legs syndrome) 01/24/2015   Per sleep study 05/2014    Sleep apnea    wear c pap   Solitary right kidney    acquired 09-19-2017 s/p left nephrectomy   SUI (stress urinary incontinence, female)    05-21-2019  improved with therapy   Systemic lupus erythematosus ([place])    rheumotologist--- dr Amil Amen (lov 03-19-2019 in epic)   Systemic lupus erythematosus ([place])    Vitamin D deficiency    Wears glasses     Past Surgical History:  Procedure Laterality Date   CESAREAN SECTION  x3   last one 1980   COLD KNIFE CERVICAL CONIZATION   10/07/1999  @[facility]    COLONOSCOPY     negative  per pt, Dr. Alice Reichert - [facility]   EXPLORATORY LAPAROTOMY MODIFIED RADICAL HYSTERECTOMY WITH PELVIC LYMPHADENECTOMY AND RESECTION MECKEL'S DIVERTICULUM  11-24-1999 dr Aldean Ast  @[facility]    INCISIONAL HERNIA REPAIR N/A 05/23/2019   Procedure: LAPAROSCOPIC [place];  Surgeon: Kinsinger, Arta Bruce, MD;  Location: [facility];  Service: General;  Laterality: N/A;   LAPAROSCOPIC NEPHRECTOMY, HAND ASSISTED Left 09/19/2017   Procedure: LEFT HAND ASSISTED LAPAROSCOPIC NEPHRECTOMY;  Surgeon: Lucas Mallow, MD;  Location: [facility];  Service: Urology;  Laterality: Left;   RECTOCELE REPAIR N/A 09/10/2021   Procedure: POSTERIOR REPAIR (RECTOCELE);  Surgeon: [name], MD;  Location[facility];  Service: Gynecology;  Laterality: N/A;    Prior to Admission medications   Medication Sig Start Date End Date Taking? Authorizing Provider  acetaminophen (TYLENOL) 500 MG tablet Take 500 mg by mouth every 6 (six) hours as needed for mild pain.   Yes [provider]  azaTHIOprine (IMURAN) 50 MG tablet Take 50 mg by mouth 2 (two) times daily. 02/10/22  Yes [provider]  buPROPion (WELLBUTRIN XL) 150 MG 24 hr tablet TAKE 1 TABLET EVERY MORNING FOR MOOD, FOCUS &  CONCENTRATION 03/24/22  Yes Alycia Rossetti, NP  busPIRone (BUSPAR) 15 MG tablet Take 15 mg by mouth once. 02/10/22  Yes [provider]  Cholecalciferol (VITAMIN D) 2000 UNITS CAPS Take 1 capsule by mouth daily.   Yes [provider]  escitalopram (LEXAPRO) 20 MG tablet Take 20 mg by mouth daily. 06/07/22  Yes [provider]  gabapentin (NEURONTIN) 300 MG capsule TAKE 1-2 CAPS AT NIGHT AS NEEDED FOR ANXIETY, PAIN AND SLEEP. 08/04/21  Yes [name], NP  Magnesium 500 MG TABS Take 500 mg by mouth daily.    Yes [provider]  Magnesium Gluconate 27.5 MG TABS Take 500 mg by mouth once. 01/18/18  Yes [provider]  oxybutynin (DITROPAN-XL)  5 MG 24 hr tablet Take 5 mg by mouth at bedtime. 07/29/22  Yes [provider]  rosuvastatin (CRESTOR) 5 MG tablet Take 1 tab daily for cholesterol to reduce risk of heart attack/stroke. 09/03/21  Yes [name], NP  traZODone (DESYREL) 50 MG tablet Take 50 mg by mouth at bedtime. 04/15/22  Yes [provider]    Current Outpatient Medications  Medication Sig Dispense Refill   acetaminophen (TYLENOL) 500 MG tablet Take 500 mg by mouth every 6 (six) hours as needed for mild pain.     azaTHIOprine (IMURAN) 50 MG tablet Take 50 mg by mouth 2 (two) times daily.     buPROPion (WELLBUTRIN XL) 150 MG 24 hr tablet TAKE 1 TABLET EVERY MORNING FOR MOOD, FOCUS & CONCENTRATION 90 tablet 1   busPIRone (BUSPAR) 15 MG tablet Take 15 mg by mouth once.     Cholecalciferol (VITAMIN D) 2000 UNITS CAPS Take 1 capsule by mouth daily.     escitalopram (LEXAPRO) 20 MG tablet Take 20 mg by mouth daily.     gabapentin (NEURONTIN) 300 MG capsule TAKE 1-2 CAPS AT NIGHT AS NEEDED FOR ANXIETY, PAIN AND SLEEP. 180 capsule 3   Magnesium 500 MG TABS Take 500 mg by mouth daily.      Magnesium Gluconate 27.5 MG TABS Take 500 mg by mouth once.     oxybutynin (DITROPAN-XL) 5 MG 24 hr tablet Take 5 mg by mouth at bedtime.     rosuvastatin (CRESTOR) 5 MG tablet Take 1 tab daily for cholesterol to reduce risk of heart attack/stroke. 90 tablet 3   traZODone (DESYREL) 50 MG tablet Take 50 mg by mouth at bedtime.     Current Facility-Administered Medications  Medication Dose Route Frequency Provider Last Rate Last Admin   0.9 %  sodium chloride infusion  500 mL Intravenous Continuous Gatha Mayer, MD        Allergies as of 08/03/2022 - Review Complete 08/03/2022  Allergen Reaction Noted   Bactrim [sulfamethoxazole-trimethoprim]  05/18/2013   Cymbalta [duloxetine hcl]  05/18/2013   Acyclovir and related Itching 12/23/2015    Family History  Problem Relation Age of Onset   Hyperlipidemia Mother     Hypertension Mother    Heart disease Mother    Colon cancer Neg Hx    Colon polyps Neg Hx    Crohn's disease Neg Hx    Esophageal cancer Neg Hx    Rectal cancer Neg Hx    Stomach cancer Neg Hx    Ulcerative colitis Neg Hx     Social History   Socioeconomic History   Marital status: Single    Spouse name: Not on file   Number of children: 3   Years of education: Not on  file   Highest education level: Not on file  Occupational History   Occupation: Network engineer  Tobacco Use   Smoking status: Never   Smokeless tobacco: Never  Vaping Use   Vaping Use: Never used  Substance and Sexual Activity   Alcohol use: No   Drug use: Never   Sexual activity: Not Currently  Other Topics Concern   Not on file  Social History Narrative   Single - 3 children   Secretary [employer]   Never smoker, No ETOH/drugs   Social Determinants of Health   Financial Resource Strain: Not on file  Food Insecurity: Not on file  Transportation Needs: Not on file  Physical Activity: Not on file  Stress: Not on file  Social Connections: Not on file  Intimate Partner Violence: Not on file    Review of Systems:  All other review of systems negative except as mentioned in the HPI.  Physical Exam: Vital signs BP 139/76   Pulse 69   Temp 98 F (36.7 C) (Skin)   Resp 19   Ht 5' 1.5" (1.562 m)   Wt 203 lb (92.1 kg)   SpO2 100%   BMI 37.74 kg/m   General:   Alert,  Well-developed, well-nourished, pleasant and cooperative in NAD Lungs:  Clear throughout to auscultation.   Heart:  Regular rate and rhythm; no murmurs, clicks, rubs,  or gallops. Abdomen:  Soft, nontender and nondistended. Normal bowel sounds.   Neuro/Psych:  Alert and cooperative. Normal mood and affect. A and O x 3   @Dustin Burrill  Simonne Maffucci, MD, [facility] [phone] (pager) 08/03/2022 10:42 AM@

## 2022-08-03 NOTE — Op Note (Signed)
[facility] Patient Name: Beth [name] Procedure Date: 08/03/2022 10:38 AM MRN: [id number] Endoscopist: Gatha Mayer , MD, [id number] Age: 67 Referring MD:  Date of Birth: 23-Feb-1956 Gender: Female Account #: [id number] Procedure:                Colonoscopy Indications:              Screening for colorectal malignant neoplasm Medicines:                Monitored Anesthesia Care Procedure:                Pre-Anesthesia Assessment:                           - Prior to the procedure, a History and Physical                            was performed, and patient medications and                            allergies were reviewed. The patient's tolerance of                            previous anesthesia was also reviewed. The risks                            and benefits of the procedure and the sedation                            options and risks were discussed with the patient.                            All questions were answered, and informed consent                            was obtained. Prior Anticoagulants: The patient has                            taken no anticoagulant or antiplatelet agents. ASA                            Grade Assessment: III - A patient with severe                            systemic disease. After reviewing the risks and                            benefits, the patient was deemed in satisfactory                            condition to undergo the procedure.                           After obtaining informed consent, the colonoscope  was passed under direct vision. Throughout the                            procedure, the patient's blood pressure, pulse, and                            oxygen saturations were monitored continuously. The                            CF [id number] was introduced through the anus                            and advanced to the the cecum, identified by                            appendiceal  orifice and ileocecal valve. The                            colonoscopy was performed without difficulty. The                            patient tolerated the procedure well. The quality                            of the bowel preparation was good. The ileocecal                            valve, appendiceal orifice, and rectum were                            photographed. The bowel preparation used was                            GoLYTELY via split dose instruction. Scope In: 10:54:01 AM Scope Out: [id number] Scope Withdrawal Time: 0 hours 11 minutes 40 seconds  Total Procedure Duration: 0 hours 15 minutes 11 seconds  Findings:                 The perianal and digital rectal examinations were                            normal.                           There was evidence of a prior hemorrhoidectomy                            anastomosis in the rectum. This was characterized                            by healthy appearing mucosa.                           The exam was otherwise without abnormality on  direct and retroflexion views. Complications:            No immediate complications. Estimated Blood Loss:     Estimated blood loss: none. Impression:               - Hemorrhoidectomy anastomosis, characterized by                            healthy appearing mucosa.                           - The examination was otherwise normal on direct                            and retroflexion views.                           - No specimens collected. Recommendation:           - Patient has a contact number available for                            emergencies. The signs and symptoms of potential                            delayed complications were discussed with the                            patient. Return to normal activities tomorrow.                            Written discharge instructions were provided to the                            patient.                            - Resume previous diet.                           - Continue present medications.                           - No repeat colonoscopy due to current age (22                            years or older) and the absence of colonic polyps. Gatha Mayer, MD 08/03/2022 11:20:46 AM This report has been signed electronically.

## 2022-08-03 NOTE — Progress Notes (Signed)
Pt's states no medical or surgical changes since previsit or office visit. 

## 2022-08-03 NOTE — Patient Instructions (Addendum)
No polyps or cancer were seen.  No cause of your pain or constipation seen. Think much of this is due to pelvic floor dysfunction.  The leg symptoms need evaluation as we discussed so see your PCP.  I do not recommend routine repeat colonoscopy given lack of polyps now, in past and your age.  I appreciate the opportunity to care for you. Gatha Mayer, MD, [facility]   Discharge instructions given. Normal exam. Resume previous medications. YOU HAD AN ENDOSCOPIC PROCEDURE TODAY AT [facility]:   Refer to the procedure report that was given to you for any specific questions about what was found during the examination.  If the procedure report does not answer your questions, please call your gastroenterologist to clarify.  If you requested that your care partner not be given the details of your procedure findings, then the procedure report has been included in a sealed envelope for you to review at your convenience later.  YOU SHOULD EXPECT: Some feelings of bloating in the abdomen. Passage of more gas than usual.  Walking can help get rid of the air that was put into your GI tract during the procedure and reduce the bloating. If you had a lower endoscopy (such as a colonoscopy or flexible sigmoidoscopy) you may notice spotting of blood in your stool or on the toilet paper. If you underwent a bowel prep for your procedure, you may not have a normal bowel movement for a few days.  Please Note:  You might notice some irritation and congestion in your nose or some drainage.  This is from the oxygen used during your procedure.  There is no need for concern and it should clear up in a day or so.  SYMPTOMS TO REPORT IMMEDIATELY:  Following lower endoscopy (colonoscopy or flexible sigmoidoscopy):  Excessive amounts of blood in the stool  Significant tenderness or worsening of abdominal pains  Swelling of the abdomen that is new, acute  Fever of 100F or higher   For urgent or  emergent issues, a gastroenterologist can be reached at any hour by calling [phone]. Do not use MyChart messaging for urgent concerns.    DIET:  We do recommend a small meal at first, but then you may proceed to your regular diet.  Drink plenty of fluids but you should avoid alcoholic beverages for 24 hours.  ACTIVITY:  You should plan to take it easy for the rest of today and you should NOT DRIVE or use heavy machinery until tomorrow (because of the sedation medicines used during the test).    FOLLOW UP: Our staff will call the number listed on your records the next business day following your procedure.  We will call around 7:15- 8:00 am to check on you and address any questions or concerns that you may have regarding the information given to you following your procedure. If we do not reach you, we will leave a message.     If any biopsies were taken you will be contacted by phone or by letter within the next 1-3 weeks.  Please call us at [phone] if you have not heard about the biopsies in 3 weeks.    SIGNATURES/CONFIDENTIALITY: You and/or your care partner have signed paperwork which will be entered into your electronic medical record.  These signatures attest to the fact that that the information above on your After Visit Summary has been reviewed and is understood.  Full responsibility of the confidentiality of this discharge information  lies with you and/or your care-partner.

## 2022-08-03 NOTE — Progress Notes (Unsigned)
Uneventful anesthetic. Report to pacu rn. Vss. Care resumed by rn. 

## 2022-08-04 ENCOUNTER — Telehealth: Payer: Self-pay | Admitting: *Deleted

## 2022-08-04 NOTE — Telephone Encounter (Signed)
Left message on f/u call 

## 2022-08-05 ENCOUNTER — Other Ambulatory Visit: Payer: Self-pay | Admitting: Physician Assistant

## 2022-08-05 DIAGNOSIS — G5790 Unspecified mononeuropathy of unspecified lower limb: Secondary | ICD-10-CM

## 2022-08-25 ENCOUNTER — Encounter: Payer: Self-pay | Admitting: Family Medicine

## 2022-08-26 ENCOUNTER — Emergency Department (HOSPITAL_COMMUNITY)
Admission: EM | Admit: 2022-08-26 | Discharge: 2022-08-26 | Disposition: A | Discharge status: Home / Self Care | Payer: BC Managed Care – PPO | Attending: Emergency Medicine | Admitting: Emergency Medicine

## 2022-08-26 ENCOUNTER — Encounter (HOSPITAL_COMMUNITY): Payer: Self-pay

## 2022-08-26 DIAGNOSIS — H547 Unspecified visual loss: Secondary | ICD-10-CM | POA: Insufficient documentation

## 2022-08-26 DIAGNOSIS — E1122 Type 2 diabetes mellitus with diabetic chronic kidney disease: Secondary | ICD-10-CM | POA: Diagnosis not present

## 2022-08-26 DIAGNOSIS — H16002 Unspecified corneal ulcer, left eye: Secondary | ICD-10-CM | POA: Insufficient documentation

## 2022-08-26 DIAGNOSIS — I129 Hypertensive chronic kidney disease with stage 1 through stage 4 chronic kidney disease, or unspecified chronic kidney disease: Secondary | ICD-10-CM | POA: Diagnosis not present

## 2022-08-26 DIAGNOSIS — H5712 Ocular pain, left eye: Secondary | ICD-10-CM | POA: Diagnosis present

## 2022-08-26 DIAGNOSIS — N189 Chronic kidney disease, unspecified: Secondary | ICD-10-CM | POA: Diagnosis not present

## 2022-08-26 MED ORDER — TETRACAINE HCL 0.5 % OP SOLN
2.0000 [drp] | Freq: Once | OPHTHALMIC | Status: DC
  Filled 2022-08-26: qty 4

## 2022-08-26 MED ORDER — FLUORESCEIN SODIUM 1 MG OP STRP
1.0000 | ORAL_STRIP | Freq: Once | OPHTHALMIC | Status: AC
  Administered 2022-08-26: 1 via OPHTHALMIC
  Filled 2022-08-26: qty 1

## 2022-08-26 NOTE — ED Notes (Signed)
Visual Acuity Screening  Left eye 20/800  Right eye 20/400  Both eyes 20/100

## 2022-08-26 NOTE — Discharge Instructions (Addendum)
Please go immediately to the ophthalmologist office as listed above.  They would like to further evaluate you in the office today.  If you have any concerns about transporting yourself, please have someone else transport you or we may send you over via ambulance.

## 2022-08-26 NOTE — ED Triage Notes (Signed)
Pt c/o left eye pain since this mornign. Pt denies any contacts with pink eye. Eye is red and itchy. No relief with OTC eye drops.

## 2022-08-26 NOTE — ED Provider Notes (Signed)
[place] EMERGENCY DEPARTMENT AT [facility] Provider Note   CSN: [id number] Arrival date & time: 08/26/22  1035     History  Chief Complaint  Patient presents with   Eye Pain    Beth [name] is a 67 y.o. female with past medical history hypertension, hyperlipidemia, prediabetes, CKD who presents to the ED complaining of left eye redness and pain that started yesterday.  She states that at the time of onset she mostly noticed the pain and then it became increasingly swollen and painful.  She woke up this morning and noticed that she had decreased vision to the left eye.  She denies any associated injury or foreign body to the eye.  She denies any toxic exposure.  At baseline, she wears corrective lenses and her last eye examination was a little over a year ago.  She does not wear contact lenses.  She states that she has had some watery drainage from the eye but no other discharge.  She denies associated fever, chills, nausea, vomiting, chest pain, or shortness of breath.  Some recent upper respiratory symptoms including cough and congestion but this started before the issues with her eye.  She denies floaters, cuts in vision, diplopia.    Home Medications Prior to Admission medications   Medication Sig Start Date End Date Taking? Authorizing Provider  acetaminophen (TYLENOL) 500 MG tablet Take 500 mg by mouth every 6 (six) hours as needed for mild pain.    [provider]  azaTHIOprine (IMURAN) 50 MG tablet Take 50 mg by mouth 2 (two) times daily. 02/10/22   [provider]  buPROPion (WELLBUTRIN XL) 150 MG 24 hr tablet TAKE 1 TABLET EVERY MORNING FOR MOOD, FOCUS & CONCENTRATION 03/24/22   Raynelle Dick, NP  busPIRone (BUSPAR) 15 MG tablet Take 15 mg by mouth once. 02/10/22   [provider]  Cholecalciferol (VITAMIN D) 2000 UNITS CAPS Take 1 capsule by mouth daily.    [provider]  escitalopram (LEXAPRO) 20 MG tablet Take 20 mg by  mouth daily. 06/07/22   [provider]  gabapentin (NEURONTIN) 300 MG capsule TAKE 1-2 CAPS AT NIGHT AS NEEDED FOR ANXIETY, PAIN AND SLEEP. 08/04/21   Judd Gaudier, NP  Magnesium 500 MG TABS Take 500 mg by mouth daily.     [provider]  Magnesium Gluconate 27.5 MG TABS Take 500 mg by mouth once. 01/18/18   [provider]  oxybutynin (DITROPAN-XL) 5 MG 24 hr tablet Take 5 mg by mouth at bedtime. 07/29/22   [provider]  rosuvastatin (CRESTOR) 5 MG tablet Take 1 tab daily for cholesterol to reduce risk of heart attack/stroke. 09/03/21   Judd Gaudier, NP  traZODone (DESYREL) 50 MG tablet Take 50 mg by mouth at bedtime. 04/15/22   [provider]      Allergies    Bactrim [sulfamethoxazole-trimethoprim], Cymbalta [duloxetine hcl], and Acyclovir and related    Review of Systems   Review of Systems  All other systems reviewed and are negative.   Physical Exam Updated Vital Signs BP (!) 158/89 (BP Location: Right Arm)   Pulse 68   Temp 97.6 F (36.4 C) (Oral)   Resp 16   Ht 5\' 1"  (1.549 m)   Wt 92.1 kg   SpO2 99%   BMI 38.36 kg/m  Physical Exam Vitals and nursing note reviewed.  Constitutional:      General: She is not in acute distress.    Appearance: Normal appearance.  She is not ill-appearing or toxic-appearing.  HENT:     Head: Normocephalic and atraumatic.     Mouth/Throat:     Mouth: Mucous membranes are moist.  Eyes:     General: Lids are everted, no foreign bodies appreciated. Gaze aligned appropriately.        Right eye: No foreign body, discharge or hordeolum.        Left eye: No foreign body, discharge or hordeolum.     Intraocular pressure: Right eye pressure is 19 mmHg. Left eye pressure is 20 mmHg.     Extraocular Movements: Extraocular movements intact.     Right eye: No nystagmus.     Left eye: No nystagmus.     Conjunctiva/sclera:     Right eye: Right conjunctiva is injected (minimally). No exudate.    Left  eye: Left conjunctiva is injected (significantly). Exudate (watery) present.     Pupils:     Right eye: No fluorescein uptake.     Left eye: Fluorescein uptake (large circular area to inferomedial aspect of cornea) present.     Comments: Pupils of equal size bilaterally, round, reactive to light, no globe tenderness bilaterally, all visual fields grossly intact but patient does express blurred vision on left  Cardiovascular:     Rate and Rhythm: Normal rate and regular rhythm.     Heart sounds: No murmur heard. Pulmonary:     Effort: Pulmonary effort is normal.     Breath sounds: Normal breath sounds.  Abdominal:     General: Abdomen is flat.     Palpations: Abdomen is soft.     Tenderness: There is no abdominal tenderness.  Musculoskeletal:        General: Normal range of motion.     Cervical back: Normal range of motion and neck supple. No rigidity.     Right lower leg: No edema.     Left lower leg: No edema.  Skin:    General: Skin is warm and dry.     Capillary Refill: Capillary refill takes less than 2 seconds.  Neurological:     General: No focal deficit present.     Mental Status: She is alert and oriented to person, place, and time.     Cranial Nerves: No cranial nerve deficit.     Sensory: No sensory deficit.     Motor: No weakness.  Psychiatric:        Mood and Affect: Mood normal.        Behavior: Behavior normal.     ED Results / Procedures / Treatments   Labs (all labs ordered are listed, but only abnormal results are displayed) Labs Reviewed - No data to display  EKG None  Radiology No results found.  Procedures Procedures  Left eye 20/800   Right eye 20/400   Both eyes 20/100  Medications Ordered in ED Medications  fluorescein ophthalmic strip 1 strip (1 strip Both Eyes Given 08/26/22 1206)    ED Course/ Medical Decision Making/ A&P                           Medical Decision Making  Medical Decision Making:   Beth [name] is a 67 y.o.  female who presented to the ED today with eye pain, visual changes detailed above.    Patient's presentation is complicated by their history of corrective lens wearer, HLD, pre-DM, CKD.  Complete initial physical exam performed, notably the patient was in no  acute distress. She did have decreased visual acuity but all visual fields were grossly intact.  EOMI. Pupils equal, round, and reactive to light. Large grayish appearing lesion to inferomedial aspect of left cornea that with fluorescein staining does uptake. Globe non-tender and appears intact. Nonfocal neurologic exam otherwise. Cranial nerves intact. No evidence of other rash. No meningismus.  Reviewed and confirmed nursing documentation for past medical history, family history, social history.    Initial Assessment:   With the patient's presentation of eye pain, the emergent differential diagnosis for acute eye pain includes but is not limited to ocular ischemia, optic neuritis, temporal arteritis, sinusitis, neuralgia, migraine, acute angle closure glaucoma, eye trauma, uveitis, iritis, corneal abrasion/ulceration, and photokeratitis.  This is most consistent with an acute complicated illness  Initial Plan:  Eye exam as above Visual acuity Ophthalmology consult Objective evaluation as below reviewed   Consults: Case discussed with Dr. Dione BoozeGroat with ophthalmology due to concern for corneal ulcer, deteriorating vision, opthalmologic emergency. Photos sent over for review. Thinks unlikely pt has corneal ulcer but also concern for erosion, corneal hydrops, etc. Patient can be sent immediately over for office evaluation.  Final Assessment and Plan:   This is a 67 year old female who presents to ED with one day of left eye pain. Acute deteriorating vision onset this morning. On exam, pt has grayish appearing lesion to inferomedial aspect of left cornea. Significant conjunctival infection. IOP WNP. Remainder of eye exam as above grossly  unremarkable. Visual acuity as above with pt's corrective lenses. States significant change from baseline. No visual field cuts, cranial nerves intact, otherwise nonfocal neurologic exam. Low suspicion for acute CVA/TIA or other emergent neurologic condition given otherwise reassuring exam and symptoms localized to eye. No trauma or toxic exposure to eye. Globe non-tender and appears to intact. With findings, do have concern for corneal ulcer vs other emergent opthalmologic condition so ophthalmology consulted as above. Discussed consult and need for emergent evaluation with pt including risks of continued decreasing vision, visual loss, disability, etc. Pt expressed understanding of emergency of evaluation. Declined EMS transport and states she will go immediately to ophthalmology office as instructed for further evaluation. Strict ED return precautions given, all questions answered, stable at time of discharge.    Clinical Impression:  1. Corneal ulcer of left eye   2. Decreased visual acuity      Discharge    Final Clinical Impression(s) / ED Diagnoses Final diagnoses:  Corneal ulcer of left eye  Decreased visual acuity    Rx / DC Orders ED Discharge Orders     None         Richardson DoppGowens, Jonice Cerra L, PA-C 08/26/22 Lambert [id number]    Haviland, Julie, MD 08/27/22 [phone]

## 2022-09-04 ENCOUNTER — Ambulatory Visit
Admission: RE | Admit: 2022-09-04 | Discharge: 2022-09-04 | Disposition: A | Discharge status: Home / Self Care | Payer: BC Managed Care – PPO | Source: Ambulatory Visit | Attending: Physician Assistant | Admitting: Physician Assistant

## 2022-09-04 DIAGNOSIS — G5790 Unspecified mononeuropathy of unspecified lower limb: Secondary | ICD-10-CM

## 2022-10-26 ENCOUNTER — Ambulatory Visit: Payer: BC Managed Care – PPO | Admitting: Neurology

## 2022-10-31 ENCOUNTER — Other Ambulatory Visit: Payer: Self-pay | Admitting: Nurse Practitioner

## 2022-10-31 DIAGNOSIS — Z79899 Other long term (current) drug therapy: Secondary | ICD-10-CM

## 2022-10-31 DIAGNOSIS — F419 Anxiety disorder, unspecified: Secondary | ICD-10-CM

## 2023-02-01 ENCOUNTER — Encounter: Payer: Self-pay | Admitting: Neurology

## 2023-02-01 ENCOUNTER — Ambulatory Visit: Payer: BC Managed Care – PPO | Admitting: Neurology

## 2023-02-01 VITALS — BP 131/85 | HR 72 | Ht 62.5 in | Wt 208.0 lb

## 2023-02-01 DIAGNOSIS — G629 Polyneuropathy, unspecified: Secondary | ICD-10-CM

## 2023-02-01 DIAGNOSIS — N3281 Overactive bladder: Secondary | ICD-10-CM

## 2023-02-01 DIAGNOSIS — G834 Cauda equina syndrome: Secondary | ICD-10-CM

## 2023-02-01 DIAGNOSIS — R292 Abnormal reflex: Secondary | ICD-10-CM

## 2023-02-01 MED ORDER — ALPRAZOLAM 0.5 MG PO TABS: 0.5000 mg | ORAL_TABLET | Freq: Every evening | ORAL | 0 refills | Status: DC | PRN

## 2023-02-01 NOTE — Progress Notes (Signed)
[facility]  Provider:  Dr Vickey Huger Referring Provider: Ceasar Lund, PA Primary Care Physician:  Ceasar Lund, PA  Chief Complaint  Patient presents with   New Problem     Rm 1, here alone  Pt is here for neuropathy. Pt states she has burning/tingling sensation on lower legs. Pt states she has numbness on her pelvic area.     HPI:  Beth [name] is a 67 y.o. female and seen here upon referral from Dr. Fay Records for a Consultation/ Evaluation of Neuropathy-   She had first symptoms of neuropathy in 2004, related to lupus exacerbation.  Beth [name] follows with her new primary care provider Dwaine Deter at [facility] and in his note from March 22 of this year he reports that the patient has experienced discomfort in both legs that seems to be present throughout the day but gets exacerbated at night.  She describes tingling a feeling of pins and needle dysesthesias as well as numbness in both lower extremities and feet.  It is hard for her to tolerate clothing covering her feet and at night she feels that the discomfort is even worse when the blanket touches her feet and legs.  Both feet feel cold to touch. The numbness has left her also off balance and she feels her mobility is less stable but also this seems to be a little bit of strength loss when she climbs stairs or descends on stairs climbing up stairs is very hard and seems to be related to a weakness of the thigh..  She also feels that her knees are weaker, in pain. She feels heavy.   The patient has a history of rectocele of his rectal numbness, she has a history of anxiety depression, she was treated here for sleep apnea and still followed for CPAP use, has high cholesterol, she carries a diagnosis of lupus for over 20 years.  She has increase in urinary frequency up to 2 or 3 times at night maybe even another 3-4 times in daytime.  She does not have lower abdominal/pelvic or back pain.  She does have a history of  pulmonary emboli.  The patient never smoked.  In the past she has described ongoing low back pain and she states that when she stands up she experiences a heaviness in both legs radiating from her lower back.  Physical therapy has been ineffective her last hemoglobin A1c was 6.7 so she is newly diagnosed with diabetes type 2.  When I last saw her she had prediabetes as her last name diagnosis.  Sometimes when standing still her legs will feel increasingly weak and she will have to sit down.  She has not fallen but she does feel off balance.  Sometimes.me Has some sacral or sacroiliac pain as well.   Our goal is to find out if she has radiculopathy or peripheral neuropathy, it is not asymmetric.  Rectal numbness and a feeling of weakness below the waist.   Gabapentin 300 mg was causing swelling, puffiness and was reduced to 100 mg at night.       Beth [name] is a 67 y.o. female patient who is here for visit 02/01/2023 for new problem : NEUROPATHY . Has been seen  here for CPAP for OSA , followed last 3 visits with Shawnie Dapper, NP. Sleep and medical history:  Had CPAP for about 5 years now, compliant user. She has a history of left kidney cancer. Lupus, pre -diabetes, arthritis on Plaquenil.  PLMs were  described I her last sleep study. She had a PE in 10-09-2017 following nephrectomy. Nocturia - Dr Lorin Picket [place] is treating. She has a rectocele- has PT     .  Review of Systems: Out of a complete 14 system review, the patient complains of only the following symptoms, and all other reviewed systems are negative.  See above   Social History   Socioeconomic History   Marital status: Single    Spouse name: Not on file   Number of children: 3   Years of education: Not on file   Highest education level: Not on file  Occupational History   Occupation: Diplomatic Services operational officer  Tobacco Use   Smoking status: Never   Smokeless tobacco: Never  Vaping Use   Vaping status: Never Used  Substance and Sexual  Activity   Alcohol use: No   Drug use: Never   Sexual activity: Not Currently  Other Topics Concern   Not on file  Social History Narrative   Single - 3 children   Secretary [employer]   Never smoker, No ETOH/drugs   Social Determinants of Health   Financial Resource Strain: Not on file  Food Insecurity: No Food Insecurity (02/03/2022)   [facility] prior to 07/17/2022., [facility]   Hunger Vital Sign    Worried About Running Out of Food in the Last Year: Never true    Ran Out of Food in the Last Year: Never true  Transportation Needs: No Transportation Needs (02/03/2022)   [facility] prior to 07/17/2022., [facility] to [facility]17/[facility]   PRAPARE - Transportation    Lack of Transportation (Medical): No    Lack of Transportation (Non-Medical): No  Physical Activity: Not on file  Stress: Not on file  Social Connections: Unknown (09/29/2021)   Received from [facility], [facility]   Social Network    Social Network: Not on file  Intimate Partner Violence: Unknown (08/21/2021)   Received from [facility], [facility]   HITS    Physically Hurt: Not on file    Insult or Talk Down To: Not on file    Threaten Physical Harm: Not on file    Scream or Curse: Not on file    Family History  Problem Relation Age of Onset   Hyperlipidemia Mother    Hypertension Mother    Heart disease Mother    Colon cancer Neg Hx    Colon polyps Neg Hx    Crohn's disease Neg Hx    Esophageal cancer Neg Hx    Rectal cancer Neg Hx    Stomach cancer Neg Hx    Ulcerative colitis Neg Hx     Past Medical History:  Diagnosis Date   Anal condyloma    resected with hemorrhoidectomy 1/24   Anxiety    Cancer (HCC)    cervical,kidney   Chronic constipation    followed by dr  Leone Payor   CKD (chronic kidney disease), stage III (HCC)    Enterocele    History of cervical cancer    hx squamous cell cervical carcinoma,   s/p  radical hysterectomy 11-24-1999   History of MRSA infection    2005--- buttock abscess   History of pulmonary embolus (PE)    post op 10-09-2017 (surgery on 09-19-2017)  lower range pulmonary artery,  resolved  and completed 6 month Eliquis   History of renal cell carcinoma    s/p  left nephrectomy 09-19-2017  (oncologist -- dr Clelia Croft / urologsit-- dr bell)   History of seizure    10/ 2001  one episode per discharge note in epic, 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 (pt had just been dx with lupus);  (05-21-2019 per pt none since)   Hyperlipidemia    Incisional hernia    MDD (major depressive disorder)    OA (osteoarthritis)    OSA on CPAP    followed by dr Lamiracle Chaidez   Pre-diabetes    Rectocele    RLS (restless legs syndrome) 01/24/2015   Per sleep study 05/2014    Sleep apnea    wear c pap   Solitary right kidney    acquired 09-19-2017 s/p left nephrectomy   SUI (stress urinary incontinence, female)    05-21-2019  improved with therapy   Systemic lupus erythematosus (HCC)    rheumotologist--- dr Dierdre Forth (lov 03-19-2019 in epic)   Vitamin D deficiency    Wears glasses     Past Surgical History:  Procedure Laterality Date   CESAREAN SECTION  x3   last one 1980   COLD KNIFE CERVICAL CONIZATION   10/07/1999  @[facility]    COLONOSCOPY  08/03/2022   EXCISIONAL HEMORRHOIDECTOMY  06/2022   hemorrhoids + condyloma   EXPLORATORY LAPAROTOMY MODIFIED RADICAL HYSTERECTOMY WITH PELVIC LYMPHADENECTOMY AND RESECTION MECKEL'S DIVERTICULUM  11-24-1999 dr Loree Fee  @[facility]    INCISIONAL HERNIA REPAIR N/A 05/23/2019   Procedure: LAPAROSCOPIC INCISIONAL HERNIA REPAIR WITH MESH;  Surgeon: Kinsinger, De Blanch, MD;  Location: [facility] [place];  Service: General;  Laterality: N/A;   LAPAROSCOPIC NEPHRECTOMY, HAND  ASSISTED Left 09/19/2017   Procedure: LEFT HAND ASSISTED LAPAROSCOPIC NEPHRECTOMY;  Surgeon: Crista Elliot, MD;  Location: [facility];  Service: Urology;  Laterality: Left;   RECTOCELE REPAIR N/A 09/10/2021   Procedure: POSTERIOR REPAIR (RECTOCELE);  Surgeon: Marguerita Beards, MD;  Location: [facility];  Service: Gynecology;  Laterality: N/A;    Current Outpatient Medications  Medication Sig Dispense Refill   acetaminophen (TYLENOL) 500 MG tablet Take 500 mg by mouth every 6 (six) hours as needed for mild pain.     BENLYSTA 200 MG/ML SOAJ Inject into the skin.     buPROPion (WELLBUTRIN XL) 150 MG 24 hr tablet TAKE 1 TABLET EVERY MORNING FOR MOOD, FOCUS & CONCENTRATION 90 tablet 1   Cholecalciferol (VITAMIN D) 2000 UNITS CAPS Take 1 capsule by mouth daily.     escitalopram (LEXAPRO) 20 MG tablet TAKE 1.5 TABLETS (30 MG) DAILY FOR MOOD & CHRONIC ANXIETY 135 tablet 1   gabapentin (NEURONTIN) 300 MG capsule TAKE 1-2 CAPS AT NIGHT AS NEEDED FOR ANXIETY, PAIN AND SLEEP. 180 capsule 3   Magnesium 500 MG TABS Take 500 mg by mouth daily.      rosuvastatin (CRESTOR) 5 MG tablet Take 1 tab daily for cholesterol to reduce risk of heart attack/stroke. 90 tablet 3   traZODone (DESYREL) 50 MG tablet Take 50 mg by mouth at bedtime.     No current facility-administered medications for this visit.    Allergies as of 02/01/2023 - Review Complete 02/01/2023  Allergen Reaction Noted   Bactrim [sulfamethoxazole-trimethoprim]  05/18/2013   Cymbalta [duloxetine hcl]  05/18/2013   Acyclovir and related Itching 12/23/2015    Vitals: BP 131/85   Pulse 72   Ht 5' 2.5" (1.588 m)   [employer]  208 lb (94.3 kg)   BMI 37.44 kg/m  Last Weight:  Wt Readings from Last 1 Encounters:  02/01/23 208 lb (94.3 kg)   Last Height:   Ht Readings from Last 1 Encounters:  02/01/23 5' 2.5" (1.588 m)   Last BMI: @LASTBMI  Physical exam:  General: The patient is awake, alert and appears not in acute  distress.  The patient is well groomed. Head: Normocephalic, atraumatic.  NSkin:  Without evidence of rash Trunk: BMI is high   and patient  has stooped  posture.   Neurologic exam : The patient is awake and alert, oriented to place and time.  Memory subjective  described as intact.  There is a normal attention span & concentration ability.  Speech is fluent without  dysarthria, dysphonia or aphasia.  Mood and affect are appropriate.  Cranial nerves: Pupils are equal and briskly reactive to light. Funduscopic exam without  evidence of pallor or edema. Extraocular movements  in vertical and horizontal planes intact and without nystagmus. Visual fields by finger perimetry are intact. Hearing to finger rub intact.   Facial sensation intact to fine touch. Facial motor strength is symmetric and tongue and uvula move midline.  Motor exam:  able to relax both biceps completely.  Normal tone and normal muscle bulk and symmetric normal strength in all proximal muscles Grip Strength weaker on the right.  Proximal strength of shoulder muscles and hip flexors was unimpaired .  Sensory:  Fine touch and vibration were tested . Vibration was felt in both big toes and bilateral ankles.  Proprioception was tested in the upper extremities only and was normal.  Coordination: Rapid alternating movements in the fingers/hands were normal.  Finger-to-nose maneuver was tested and showed no evidence of ataxia, dysmetria and only mild tremor.  Gait and station: Patient walked without assistive device -  Core Strength is limited, rising fro the seated position is difficult , bracing needed,  Stance is unstable and of wider base. She shuffles, does not lift her feet much, especially the right. But no foot drop during strength exam. Cannot stand on tip toes.  Tandem gait is not possible , turns with 5 Steps , slowed Romberg testing : swaying forward and back ward   Deep tendon reflexes: in the lower extremities  are symmetric and  brisk - hyperreflexia.(!) Babinski maneuver response is up-going. (!)   MRI lumbar spine ; 08-2022. IMPRESSION: No spinal canal stenosis or neural foraminal narrowing. Multilevel facet arthropathy, which is severe bilaterally at L4-L5 and on the right at L5-S1, which can be a cause of back pain.   Assessment: Total time for face to face interview and examination, for review of  images and laboratory testing, neurophysiology testing and pre-existing records, including out-of -network , was 45 minutes. Assessment is as follows here:  Dx : 1) neuropathic pain: dyseasthesias. Diabetic neuropathy versus lupus or medication induced neuropathy. I will order a full neuropathy panel.   2) Hyperreflexia is seen in patellae, and clonus- looking for spinal stenosis or myelopathic changes  and ordering images for the thoracic spine.   3) urinary urgency and rectal anal numbness- conus or cauda involvement?       PS : Obesity , BMI 37, OSA on CPAP. Not addressed today    Melvyn Novas, MD with Np or me.   RV in 4 months

## 2023-02-05 LAB — ENA+DNA/DS+ANTICH+CENTRO+FA...
Anti JO-1: <0.2 AI (ref 0.0–0.9)
Antiribosomal P Antibodies: <0.2 AI (ref 0.0–0.9)
Centromere Ab Screen: <0.2 AI (ref 0.0–0.9)
Chromatin Ab SerPl-aCnc: 4.7 AI — ABNORMAL HIGH (ref 0.0–0.9)
ENA RNP Ab: 2.5 AI — ABNORMAL HIGH (ref 0.0–0.9)
ENA SM Ab Ser-aCnc: >8 AI — ABNORMAL HIGH (ref 0.0–0.9)
ENA SSA (RO) Ab: 0.7 AI (ref 0.0–0.9)
ENA SSB (LA) Ab: <0.2 AI (ref 0.0–0.9)
Scleroderma (Scl-70) (ENA) Antibody, IgG: 0.3 AI (ref 0.0–0.9)
Smith/RNP Antibodies: >8 AI — ABNORMAL HIGH (ref 0.0–0.9)
Speckled Pattern: 1:80 {titer}
dsDNA Ab: 2 IU/mL (ref 0–9)

## 2023-02-05 LAB — ANA W/REFLEX: ANA Titer 1: POSITIVE — AB

## 2023-02-05 LAB — ANTI-HU, ANTI-RI, ANTI-YO IFA
Anti-Hu Ab: NEGATIVE
Anti-Ri Ab: NEGATIVE
Anti-Yo Ab: NEGATIVE

## 2023-02-06 LAB — ANCA PROFILE
Anti-MPO Antibodies: <0.2 units (ref 0.0–0.9)
Anti-PR3 Antibodies: <0.2 units (ref 0.0–0.9)
Atypical pANCA: <1:20 {titer}
C-ANCA: <1:20 {titer}
P-ANCA: <1:20 {titer}

## 2023-02-06 LAB — MULTIPLE MYELOMA PANEL, SERUM
Albumin SerPl Elph-Mcnc: 3.6 g/dL (ref 2.9–4.4)
Albumin/Glob SerPl: 1.1 (ref 0.7–1.7)
Alpha 1: 0.2 g/dL (ref 0.0–0.4)
Alpha2 Glob SerPl Elph-Mcnc: 0.7 g/dL (ref 0.4–1.0)
B-Globulin SerPl Elph-Mcnc: 1 g/dL (ref 0.7–1.3)
Gamma Glob SerPl Elph-Mcnc: 1.5 g/dL (ref 0.4–1.8)
Globulin, Total: 3.4 g/dL (ref 2.2–3.9)
IgA/Immunoglobulin A, Serum: 339 mg/dL (ref 87–352)
IgG (Immunoglobin G), Serum: 1614 mg/dL — ABNORMAL HIGH (ref 586–1602)
IgM (Immunoglobulin M), Srm: 40 mg/dL (ref 26–217)

## 2023-02-06 LAB — COMPREHENSIVE METABOLIC PANEL
ALT: 19 IU/L (ref 0–32)
AST: 23 IU/L (ref 0–40)
Albumin: 4 g/dL (ref 3.9–4.9)
Alkaline Phosphatase: 61 IU/L (ref 44–121)
BUN/Creatinine Ratio: 15 (ref 12–28)
BUN: 16 mg/dL (ref 8–27)
Bilirubin Total: 0.3 mg/dL (ref 0.0–1.2)
CO2: 22 mmol/L (ref 20–29)
Calcium: 9.7 mg/dL (ref 8.7–10.3)
Chloride: 103 mmol/L (ref 96–106)
Creatinine, Ser: 1.1 mg/dL — ABNORMAL HIGH (ref 0.57–1.00)
Globulin, Total: 3 g/dL (ref 1.5–4.5)
Glucose: 81 mg/dL (ref 70–99)
Potassium: 4.4 mmol/L (ref 3.5–5.2)
Sodium: 138 mmol/L (ref 134–144)
Total Protein: 7 g/dL (ref 6.0–8.5)
eGFR: 55 mL/min/{1.73_m2} — ABNORMAL LOW (ref >59)

## 2023-02-06 LAB — HEPATITIS C ANTIBODY: Hep C Virus Ab: NONREACTIVE

## 2023-02-06 LAB — CBC WITH DIFFERENTIAL/PLATELET
Basophils Absolute: 0 10*3/uL (ref 0.0–0.2)
Basos: 1 %
EOS (ABSOLUTE): 0.1 10*3/uL (ref 0.0–0.4)
Eos: 2 %
Hematocrit: 38.3 % (ref 34.0–46.6)
Hemoglobin: 12.8 g/dL (ref 11.1–15.9)
Immature Grans (Abs): 0 10*3/uL (ref 0.0–0.1)
Immature Granulocytes: 0 %
Lymphocytes Absolute: 1.6 10*3/uL (ref 0.7–3.1)
Lymphs: 42 %
MCH: 31 pg (ref 26.6–33.0)
MCHC: 33.4 g/dL (ref 31.5–35.7)
MCV: 93 fL (ref 79–97)
Monocytes Absolute: 0.5 10*3/uL (ref 0.1–0.9)
Monocytes: 13 %
Neutrophils Absolute: 1.6 10*3/uL (ref 1.4–7.0)
Neutrophils: 42 %
Platelets: 231 10*3/uL (ref 150–450)
RBC: 4.13 x10E6/uL (ref 3.77–5.28)
RDW: 14.5 % (ref 11.7–15.4)
WBC: 3.8 10*3/uL (ref 3.4–10.8)

## 2023-02-06 LAB — TSH: TSH: 1.72 u[IU]/mL (ref 0.450–4.500)

## 2023-02-06 LAB — SEDIMENTATION RATE: Sed Rate: 19 mm/hr (ref 0–40)

## 2023-02-06 LAB — HEPATITIS B SURFACE ANTIGEN: Hepatitis B Surface Ag: NEGATIVE

## 2023-02-06 LAB — SJOGREN'S SYNDROME ANTIBODS(SSA + SSB)
ENA SSA (RO) Ab: 0.6 AI (ref 0.0–0.9)
ENA SSB (LA) Ab: <0.2 AI (ref 0.0–0.9)

## 2023-02-06 LAB — C-REACTIVE PROTEIN: CRP: 4 mg/L (ref 0–10)

## 2023-02-06 LAB — HOMOCYSTEINE: Homocysteine: 17 umol/L (ref 0.0–17.2)

## 2023-02-06 LAB — METHYLMALONIC ACID, SERUM: Methylmalonic Acid: 178 nmol/L (ref 0–378)

## 2023-02-08 ENCOUNTER — Telehealth: Payer: Self-pay | Admitting: Anesthesiology

## 2023-02-08 NOTE — Telephone Encounter (Signed)
-----   Message from Merrill Dohmeier sent at 02/07/2023  5:51 PM EDT ----- Abnormal : ANA, CMET , DS - DNA and ENA , and serum protein E- phoresis.   Strong evidence of autoimmune disorder.

## 2023-02-09 ENCOUNTER — Other Ambulatory Visit: Payer: Medicare Other

## 2023-02-15 ENCOUNTER — Ambulatory Visit (INDEPENDENT_AMBULATORY_CARE_PROVIDER_SITE_OTHER): Payer: BC Managed Care – PPO

## 2023-02-15 DIAGNOSIS — R292 Abnormal reflex: Secondary | ICD-10-CM | POA: Diagnosis not present

## 2023-02-15 DIAGNOSIS — N3281 Overactive bladder: Secondary | ICD-10-CM | POA: Diagnosis not present

## 2023-02-15 DIAGNOSIS — G629 Polyneuropathy, unspecified: Secondary | ICD-10-CM

## 2023-02-15 DIAGNOSIS — G834 Cauda equina syndrome: Secondary | ICD-10-CM

## 2023-02-15 MED ORDER — GADOBENATE DIMEGLUMINE 529 MG/ML IV SOLN
20.0000 mL | Freq: Once | INTRAVENOUS | Status: AC | PRN
  Administered 2023-02-15: 20 mL via INTRAVENOUS

## 2023-03-23 ENCOUNTER — Encounter: Payer: BC Managed Care – PPO | Admitting: Neurology

## 2023-04-06 ENCOUNTER — Ambulatory Visit: Payer: Self-pay | Admitting: Neurology

## 2023-04-06 ENCOUNTER — Ambulatory Visit (INDEPENDENT_AMBULATORY_CARE_PROVIDER_SITE_OTHER): Payer: BC Managed Care – PPO | Admitting: Neurology

## 2023-04-06 VITALS — BP 165/85 | HR 78 | Ht 62.5 in

## 2023-04-06 DIAGNOSIS — G629 Polyneuropathy, unspecified: Secondary | ICD-10-CM

## 2023-04-06 DIAGNOSIS — G834 Cauda equina syndrome: Secondary | ICD-10-CM | POA: Diagnosis not present

## 2023-04-06 DIAGNOSIS — R292 Abnormal reflex: Secondary | ICD-10-CM | POA: Diagnosis not present

## 2023-04-06 DIAGNOSIS — N3281 Overactive bladder: Secondary | ICD-10-CM | POA: Diagnosis not present

## 2023-04-06 DIAGNOSIS — Z0289 Encounter for other administrative examinations: Secondary | ICD-10-CM

## 2023-04-06 NOTE — Progress Notes (Signed)
ASSESSMENT AND PLAN  Beth [name] is a 67 y.o. female   Reported acute onset gait abnormality, pelvic area sensory loss, bowel and bladder incontinence, persistent lower extremity paresthesia,  Hyperreflexia of bilateral upper and lower extremity on examination, with bilateral Hoffmann and Babinski signs,  I have ordered MRI cervical spine to rule out cervical myelopathy  DIAGNOSTIC DATA (LABS, IMAGING, TESTING) - I reviewed patient records, labs, notes, testing and imaging myself where available.   MEDICAL HISTORY:  Beth [name], is a 67 year old female, referred by Dr. Vickey Huger for electrodiagnostic study for evaluation of neuropathy, primary care is [facility], [place].  History is obtained from the patient and review of electronic medical records. I personally reviewed pertinent available imaging films in PACS.   PMHx of  Lupus-skin lesion Depression, anxiety HLD  Patient reported long history of bilateral upper and lower extremity paresthesia, acute onset in 2002, reported she stopped taking her lupus medication, got very ill, had a seizure, loss consciousness, fell, required hospital admission, not find medical record that far back,  Per patient, she woke up noticed tense pelvic area numbness, urinary and bowel incontinence, also difficulty walking, required rehabilitation,  Over the past many years, her symptoms gradually improved, but had persistent numbness at bilateral lower extremity, the perineal region sensitivity and pain has improved, the numbness area seem to shrink some, but continue have bowel and bladder incontinence, 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, but complains of unsteady gait  She denies significant upper extremity involvement   She had a history of left nephrectomy, pathology proven CHROMOPHOBE RENAL CELL CARCINOMA, 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 (PT1B) URETERAL, VASCULAR AND ALL RESECTION MARGINS ARE NEGATIVE FOR  TUMOR  EMG nerve conduction study today showed no evidence of large fiber peripheral neuropathy, profound hyperreflexia involving upper and lower extremity  MRI of lumbar spine without contrast April 2024, multilevel mild degenerative changes, most noticeable at L4-5, L5-S1, but no significant canal foraminal stenosis  MRI of thoracic spine February 15, 2023: Normal  Laboratory evaluation in 2024, paraneoplastic panel, hepatitis B C, ANCA, ESR C-reactive protein SSA, P, TSH, MMA CMP, CBC, positive ANA pattern, with titer 1-80, with positive RNP antibody,/RNP antibody,  PHYSICAL EXAM:      04/06/2023    9:30 AM 02/01/2023    2:09 PM 08/26/2022   12:07 PM  Vitals with BMI  Height 5' 2.5" 5' 2.5"   Weight  208 lbs   BMI  37.41   Systolic 165 131 161  Diastolic 85 85 89  Pulse 78 72 68    PHYSICAL EXAMNIATION:  Gen: NAD, conversant, well nourised, well groomed                     Cardiovascular: Regular rate rhythm, no peripheral edema, warm, nontender. Eyes: Conjunctivae clear without exudates or hemorrhage Neck: Supple, no carotid bruits. Pulmonary: Clear to auscultation bilaterally   NEUROLOGICAL EXAM:  MENTAL STATUS: Speech/cognition: Awake, alert, oriented to history taking and casual conversation CRANIAL NERVES: CN II: Visual fields are full to confrontation. Pupils are round equal and briskly reactive to light. CN III, IV, VI: extraocular movement are normal. No ptosis. CN V: Facial sensation is intact to light touch CN VII: Face is symmetric with normal eye closure  CN VIII: Hearing is normal to causal conversation. CN IX, X: Phonation is normal. CN XI: Head turning and shoulder shrug are intact  MOTOR: Upper extremity muscle weakness,  moderate bilateral lower extremity spasticity, and muscle weakness  REFLEXES: Reflexes are 3 and symmetric at the biceps, triceps, knees, and ankles. Plantar responses are extensor bilaterally, bilateral Hoffmann  sign,  SENSORY: Intact to light touch, pinprick and vibratory sensation are intact in fingers and toes.  COORDINATION: There is no trunk or limb dysmetria noted.  GAIT/STANCE: Pushing up to get up from seated position, stiff, wide-based cautious  REVIEW OF SYSTEMS:  Full 14 system review of systems performed and notable only for as above All other review of systems were negative.   ALLERGIES: Allergies  Allergen Reactions   Bactrim [Sulfamethoxazole-Trimethoprim]     GI upset   Cymbalta [Duloxetine Hcl]     Sweating   Acyclovir And Related Itching    HOME MEDICATIONS: Current Outpatient Medications  Medication Sig Dispense Refill   acetaminophen (TYLENOL) 500 MG tablet Take 500 mg by mouth every 6 (six) hours as needed for mild pain.     ALPRAZolam (XANAX) 0.5 MG tablet Take 1 tablet (0.5 mg total) by mouth at bedtime as needed for anxiety. 2 tablet 0   BENLYSTA 200 MG/ML SOAJ Inject into the skin.     buPROPion (WELLBUTRIN XL) 150 MG 24 hr tablet TAKE 1 TABLET EVERY MORNING FOR MOOD, FOCUS & CONCENTRATION 90 tablet 1   Cholecalciferol (VITAMIN D) 2000 UNITS CAPS Take 1 capsule by mouth daily.     escitalopram (LEXAPRO) 20 MG tablet TAKE 1.5 TABLETS (30 MG) DAILY FOR MOOD & CHRONIC ANXIETY 135 tablet 1   gabapentin (NEURONTIN) 300 MG capsule TAKE 1-2 CAPS AT NIGHT AS NEEDED FOR ANXIETY, PAIN AND SLEEP. 180 capsule 3   Magnesium 500 MG TABS Take 500 mg by mouth daily.      rosuvastatin (CRESTOR) 5 MG tablet Take 1 tab daily for cholesterol to reduce risk of heart attack/stroke. 90 tablet 3   traZODone (DESYREL) 50 MG tablet Take 50 mg by mouth at bedtime.     No current facility-administered medications for this visit.    PAST MEDICAL HISTORY: Past Medical History:  Diagnosis Date   Anal condyloma    resected with hemorrhoidectomy 1/24   Anxiety    Cancer (HCC)    cervical,kidney   Chronic constipation    followed by dr Leone Payor   CKD (chronic kidney disease), stage  III (HCC)    Enterocele    History of cervical cancer    hx squamous cell cervical carcinoma,   s/p  radical hysterectomy 11-24-1999   History of MRSA infection    2005--- buttock abscess   History of pulmonary embolus (PE)    post op 10-09-2017 (surgery on 09-19-2017)  lower range pulmonary artery,  resolved and completed 6 month Eliquis   History of renal cell carcinoma    s/p  left nephrectomy 09-19-2017  (oncologist -- dr Clelia Croft / urologsit-- dr bell)   History of seizure    10/ 2001  one episode per discharge note in epic, 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 (pt had just been dx with lupus);  (05-21-2019 per pt none since)   Hyperlipidemia    Incisional hernia    MDD (major depressive disorder)    OA (osteoarthritis)    OSA on CPAP    followed by dr dohmeier   Pre-diabetes    Rectocele    RLS (restless legs syndrome) 01/24/2015   Per sleep study 05/2014    Sleep apnea    wear c pap   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   SUI (stress urinary incontinence, female)    05-21-2019  improved with therapy   Systemic lupus erythematosus (HCC)    rheumotologist--- dr Dierdre Forth (lov 03-19-2019 in epic)   Vitamin D deficiency    Wears glasses     PAST SURGICAL HISTORY: Past Surgical History:  Procedure Laterality Date   CESAREAN SECTION  x3   last one 1980   COLD KNIFE CERVICAL CONIZATION   10/07/1999  @[facility]    COLONOSCOPY  08/03/2022   EXCISIONAL HEMORRHOIDECTOMY  06/2022   hemorrhoids + condyloma   EXPLORATORY LAPAROTOMY MODIFIED RADICAL HYSTERECTOMY WITH PELVIC LYMPHADENECTOMY AND RESECTION MECKEL'S DIVERTICULUM  11-24-1999 dr Loree Fee  @[facility]    INCISIONAL HERNIA REPAIR N/A 05/23/2019   Procedure: LAPAROSCOPIC INCISIONAL HERNIA REPAIR WITH MESH;  Surgeon: Kinsinger, De Blanch, MD;  Location: [facility];  Service: General;  Laterality: N/A;   LAPAROSCOPIC NEPHRECTOMY, HAND ASSISTED Left 09/19/2017    Procedure: LEFT HAND ASSISTED LAPAROSCOPIC NEPHRECTOMY;  Surgeon: Crista Elliot, MD;  Location: [facility];  Service: Urology;  Laterality: Left;   RECTOCELE REPAIR N/A 09/10/2021   Procedure: POSTERIOR REPAIR (RECTOCELE);  Surgeon: Marguerita Beards, MD;  Location: [facility];  Service: Gynecology;  Laterality: N/A;    FAMILY HISTORY: Family History  Problem Relation Age of Onset   Hyperlipidemia Mother    Hypertension Mother    Heart disease Mother    Colon cancer Neg Hx    Colon polyps Neg Hx    Crohn's disease Neg Hx    Esophageal cancer Neg Hx    Rectal cancer Neg Hx    Stomach cancer Neg Hx    Ulcerative colitis Neg Hx     SOCIAL HISTORY: Social History   Socioeconomic History   Marital status: Single    Spouse name: Not on file   Number of children: 3   Years of education: Not on file   Highest education level: Not on file  Occupational History   Occupation: Diplomatic Services operational officer  Tobacco Use   Smoking status: Never   Smokeless tobacco: Never  Vaping Use   Vaping status: Never Used  Substance and Sexual Activity   Alcohol use: No   Drug use: Never   Sexual activity: Not Currently  Other Topics Concern   Not on file  Social History Narrative   Single - 3 children   Secretary [employer]   Never smoker, No ETOH/drugs   Social Determinants of Health   Financial Resource Strain: Not on file  Food Insecurity: Low Risk  (02/08/2023)   Received from [facility]   Hunger Vital Sign    Worried About Running Out of Food in the Last Year: Never true    Ran Out of Food in the Last Year: Never true  Transportation Needs: No Transportation Needs (02/08/2023)   Received from [facility]    In the past 12 months, has lack of reliable transportation kept you from medical appointments, meetings, work or from getting things needed for daily living? : No  Physical Activity: Not on file  Stress: Not on file  Social Connections: Unknown  (09/29/2021)   Received from [facility]   Social Network    Social Network: Not on file  Intimate Partner Violence: Unknown (08/21/2021)   Received from [facility], [facility]   HITS    Physically Hurt: Not on file    Insult or Talk Down To: Not on file  Threaten Physical Harm: Not on file    Scream or Curse: Not on file      Levert Feinstein, M.D. Ph.D.  [facility] [address], [address] [place], [place] [address] Ph: [phone] Fax: [phone]  CC:  Ceasar Lund, PA [address] [place],  [place] [address]  Ceasar Lund, [place]

## 2023-04-12 ENCOUNTER — Telehealth: Payer: Self-pay

## 2023-04-12 ENCOUNTER — Encounter: Payer: Self-pay | Admitting: Neurology

## 2023-04-12 NOTE — Telephone Encounter (Signed)
Returned call to patient, she states that she isn't sure if she needs another MRI because of previous one being normal. I explained the difference int he one she had previously ( thoracic) and one Dr. Terrace Arabia had discussed ( cervical ) and patient isn't sure she needs it. She request a call back with more information from Dr. Terrace Arabia. Advised she was out of the office until Monday and she was fine waiting until then. Patient appreciative of call

## 2023-04-18 NOTE — Telephone Encounter (Signed)
She is scheduled for the MRI at [facility] 12/11 at 11:30am

## 2023-04-18 NOTE — Telephone Encounter (Signed)
Pt called and LVM stating she is returning a call to RN or MD. Please advise.

## 2023-04-18 NOTE — Telephone Encounter (Signed)
Call to patient, reviewed Dr. Terrace Arabia recommendations. Patient in agreement for MRI cervical spine. She said no one has called yet, advised I would reach out about update.Patient appreciative of call

## 2023-04-18 NOTE — Telephone Encounter (Addendum)
Fail to reach patient, left message for her to call back  Please try to reach patient again, explaining she needs MRI of the cervical spine, previous MRIs did not include cervical regions ---- I explained to the patient why she needs MRI of the cervical spine, she will proceed with

## 2023-04-18 NOTE — Telephone Encounter (Signed)
 Unable to leave msg 1st attempt

## 2023-04-18 NOTE — Procedures (Signed)
Full Name: Yanelie Polin Gender: Female MRN #: [id number] Date of Birth: 1956-01-25    Visit Date: 04/06/2023 09:27 Age: 67 Years Examining Physician: Dr. Levert Feinstein Referring Physician: Dr. Porfirio Mylar Dohmeier Height: 5 feet 3 inch History:   67 year old female presenting with worsening upper and lower extremity paresthesia, also slow worsening gait abnormality  Summary of the test: Nerve conduction study: Bilateral sural, superficial peroneal sensory responses were normal.  Bilateral tibial, peroneal to EDB motor responses were normal.  Electromyography: Selected needle examination of bilateral lower extremity muscles and lumbosacral paraspinal muscles were normal.  Conclusion: This is a normal study.  There is no electrodiagnostic evidence of large fiber peripheral neuropathy or bilateral lumbosacral radiculopathy.    ------------------------------- Levert Feinstein, M.D. PhD  [facility] [address] [place], [place] [address] Tel: [phone] Fax: [phone]  Verbal informed consent was obtained from the patient, patient was informed of potential risk of procedure, including bruising, bleeding, hematoma formation, infection, muscle weakness, muscle pain, numbness, among others.        MNC    Nerve / Sites Muscle Latency Ref. Amplitude Ref. Rel Amp Segments Distance Velocity Ref. Area    ms ms mV mV %  cm m/s m/s mVms  R Peroneal - EDB     Ankle EDB 4.1 <=6.5 5.4 >=2.0 100 Ankle - EDB 9   16.7     Fib head EDB 9.2  4.8  88.6 Fib head - Ankle 24.6 48 >=44 15.3     Pop fossa EDB 11.1  4.9  103 Pop fossa - Fib head 12 63 >=44 15.3         Pop fossa - Ankle      L Peroneal - EDB     Ankle EDB 4.2 <=6.5 6.8 >=2.0 100 Ankle - EDB 9   22.9     Fib head EDB 9.9  6.1  89.4 Fib head - Ankle 26 46 >=44 20.0     Pop fossa EDB 11.5  6.7  110 Pop fossa - Fib head 10 63 >=44 24.3         Pop fossa - Ankle      R Tibial - AH     Ankle AH 5.8 <=5.8  4.1 >=4.0 100 Ankle - AH 9   7.7     Pop fossa AH 11.7  3.3  80.5 Pop fossa - Ankle 35 59 >=41 6.8  L Tibial - AH     Ankle AH 4.0 <=5.8 4.6 >=4.0 100 Ankle - AH 9   13.3     Pop fossa AH 11.2  4.4  94.6 Pop fossa - Ankle 33 46 >=41 15.6             SNC    Nerve / Sites Rec. Site Peak Lat Ref.  Amp Ref. Segments Distance    ms ms V V  cm  R Sural - Ankle (Calf)     Calf Ankle 2.8 <=4.4 12 >=6 Calf - Ankle 14  L Sural - Ankle (Calf)     Calf Ankle 2.6 <=4.4 8 >=6 Calf - Ankle 14  R Superficial peroneal - Ankle     Lat leg Ankle 3.9 <=4.4 13 >=6 Lat leg - Ankle 14  L Superficial peroneal - Ankle     Lat leg Ankle 3.8 <=4.4 6 >=6 Lat leg - Ankle 14  F  Wave    Nerve F Lat Ref.   ms ms  R Tibial - AH 50.9 <=56.0  L Tibial - AH 44.9 <=56.0         H Reflex    Nerve H Lat Lat Hmax   ms ms   Left Right Ref. Left Right Ref.  Tibial - [place]us 35.2 36.0 <=35.0 20.2 33.7 <=35.0         EMG Summary Table    Spontaneous MUAP Recruitment  Muscle IA Fib PSW Fasc Other Amp Dur. Poly Pattern  R. Tibialis anterior Normal None None None _______ Normal Normal Normal Normal  R. Tibialis posterior Normal None None None _______ Normal Normal Normal Normal  R. Peroneus longus Normal None None None _______ Normal Normal Normal Normal  R. Gastrocnemius (Medial head) Normal None None None _______ Normal Normal Normal Normal  R. Vastus lateralis Normal None None None _______ Normal Normal Normal Normal  L. Tibialis anterior Normal None None None _______ Normal Normal Normal Normal  L. Tibialis posterior Normal None None None _______ Normal Normal Normal Normal  L. Peroneus longus Normal None None None _______ Normal Normal Normal Normal  L. Gastrocnemius (Medial head) Normal None None None _______ Normal Normal Normal Normal  L. Vastus lateralis Normal None None None _______ Normal Normal Normal Normal  L. Lumbar paraspinals (low) Normal None None None _______ Normal Normal Normal  Normal  L. Lumbar paraspinals (mid) Normal None None None _______ Normal Normal Normal Normal  R. Lumbar paraspinals (low) Normal None None None _______ Normal Normal Normal Normal  R. Lumbar paraspinals (mid) Normal None None None _______ Normal Normal Normal Normal

## 2023-04-21 ENCOUNTER — Telehealth: Payer: Self-pay

## 2023-04-21 NOTE — Telephone Encounter (Signed)
Called patient and informed her Dr. Vickey Huger has reviewed her results and the test was normal. Pt verbalized understanding. Pt had no questions at this time but was encouraged to call back if questions arise.

## 2023-04-21 NOTE — Telephone Encounter (Signed)
-----   Message from [place] Dohmeier sent at 04/20/2023  4:43 PM EST ----- This is a normal study for NCV and EMG

## 2023-04-27 ENCOUNTER — Ambulatory Visit (INDEPENDENT_AMBULATORY_CARE_PROVIDER_SITE_OTHER): Payer: Medicare Other

## 2023-04-27 ENCOUNTER — Telehealth (INDEPENDENT_AMBULATORY_CARE_PROVIDER_SITE_OTHER): Payer: BC Managed Care – PPO | Admitting: Neurology

## 2023-04-27 DIAGNOSIS — R32 Unspecified urinary incontinence: Secondary | ICD-10-CM | POA: Insufficient documentation

## 2023-04-27 DIAGNOSIS — R292 Abnormal reflex: Secondary | ICD-10-CM

## 2023-04-27 DIAGNOSIS — G629 Polyneuropathy, unspecified: Secondary | ICD-10-CM

## 2023-04-27 DIAGNOSIS — N3281 Overactive bladder: Secondary | ICD-10-CM | POA: Diagnosis not present

## 2023-04-27 DIAGNOSIS — G834 Cauda equina syndrome: Secondary | ICD-10-CM

## 2023-04-27 NOTE — Telephone Encounter (Signed)
I called patient, MRI of the cervical spine findings would not explain her current difficulty, we will proceed with MRI of the brain,   IMPRESSION: MRI scan of cervical spine without contrast showing mild disc degenerative changes throughout but without significant compression.   Orders Placed This Encounter  Procedures   MR BRAIN WO CONTRAST

## 2023-04-28 NOTE — Telephone Encounter (Signed)
Please see the MyChart message reply(ies) for my assessment and plan.    This patient gave consent for this Medical Advice Message and is aware that it may result in a bill to their insurance company, as well as the possibility of receiving a bill for a co-payment or deductible. They are an established patient, but are not seeking medical advice exclusively about a problem treated during an in person or video visit in the last seven days. I did not recommend an in person or video visit within seven days of my reply.    I spent a total of 5 minutes cumulative time within 7 days through MyChart messaging.  Lyriq Finerty, MD   

## 2023-06-16 ENCOUNTER — Encounter: Payer: Self-pay | Admitting: Neurology

## 2023-06-29 ENCOUNTER — Ambulatory Visit: Payer: Medicare Other

## 2023-06-29 DIAGNOSIS — R32 Unspecified urinary incontinence: Secondary | ICD-10-CM

## 2023-06-29 DIAGNOSIS — R292 Abnormal reflex: Secondary | ICD-10-CM

## 2023-06-30 ENCOUNTER — Encounter: Payer: Self-pay | Admitting: Neurology

## 2023-06-30 DIAGNOSIS — R32 Unspecified urinary incontinence: Secondary | ICD-10-CM

## 2023-06-30 DIAGNOSIS — R292 Abnormal reflex: Secondary | ICD-10-CM

## 2023-06-30 NOTE — Telephone Encounter (Addendum)
 Give her a follow up with me in 4-6 months  Encouraged her to continue follow-up with [facility]

## 2023-10-13 ENCOUNTER — Ambulatory Visit: Payer: Self-pay | Admitting: Neurology

## 2023-10-20 ENCOUNTER — Other Ambulatory Visit: Payer: Self-pay | Admitting: Physician Assistant

## 2023-10-20 DIAGNOSIS — Z1231 Encounter for screening mammogram for malignant neoplasm of breast: Secondary | ICD-10-CM

## 2023-10-21 ENCOUNTER — Other Ambulatory Visit: Payer: Self-pay | Admitting: Obstetrics and Gynecology

## 2023-10-21 DIAGNOSIS — R198 Other specified symptoms and signs involving the digestive system and abdomen: Secondary | ICD-10-CM

## 2023-10-30 ENCOUNTER — Ambulatory Visit
Admission: RE | Admit: 2023-10-30 | Discharge: 2023-10-30 | Disposition: A | Discharge status: Home / Self Care | Payer: No Typology Code available for payment source | Source: Ambulatory Visit | Attending: Obstetrics and Gynecology | Admitting: Obstetrics and Gynecology

## 2023-10-30 DIAGNOSIS — R198 Other specified symptoms and signs involving the digestive system and abdomen: Secondary | ICD-10-CM

## 2023-11-08 ENCOUNTER — Ambulatory Visit: Payer: Self-pay

## 2023-11-14 ENCOUNTER — Encounter (INDEPENDENT_AMBULATORY_CARE_PROVIDER_SITE_OTHER): Payer: Self-pay

## 2023-11-16 ENCOUNTER — Other Ambulatory Visit: Payer: Self-pay | Admitting: Medical Genetics

## 2023-11-23 ENCOUNTER — Ambulatory Visit: Payer: Self-pay

## 2023-11-28 ENCOUNTER — Ambulatory Visit: Payer: Self-pay | Admitting: Gastroenterology

## 2023-12-02 ENCOUNTER — Ambulatory Visit

## 2023-12-13 ENCOUNTER — Ambulatory Visit

## 2023-12-26 ENCOUNTER — Ambulatory Visit: Payer: Self-pay | Admitting: Gastroenterology

## 2023-12-30 ENCOUNTER — Ambulatory Visit
Admission: RE | Admit: 2023-12-30 | Discharge: 2023-12-30 | Disposition: A | Discharge status: Home / Self Care | Source: Ambulatory Visit | Attending: Physician Assistant | Admitting: Physician Assistant

## 2023-12-30 DIAGNOSIS — Z1231 Encounter for screening mammogram for malignant neoplasm of breast: Secondary | ICD-10-CM

## 2024-01-06 ENCOUNTER — Ambulatory Visit (INDEPENDENT_AMBULATORY_CARE_PROVIDER_SITE_OTHER): Admitting: Audiology

## 2024-01-06 ENCOUNTER — Encounter (INDEPENDENT_AMBULATORY_CARE_PROVIDER_SITE_OTHER): Payer: Self-pay | Admitting: Physician Assistant

## 2024-01-06 ENCOUNTER — Ambulatory Visit (INDEPENDENT_AMBULATORY_CARE_PROVIDER_SITE_OTHER): Admitting: Physician Assistant

## 2024-01-06 VITALS — BP 159/101 | HR 76

## 2024-01-06 DIAGNOSIS — H9071 Mixed conductive and sensorineural hearing loss, unilateral, right ear, with unrestricted hearing on the contralateral side: Secondary | ICD-10-CM

## 2024-01-06 DIAGNOSIS — H9042 Sensorineural hearing loss, unilateral, left ear, with unrestricted hearing on the contralateral side: Secondary | ICD-10-CM | POA: Diagnosis not present

## 2024-01-06 DIAGNOSIS — H908 Mixed conductive and sensorineural hearing loss, unspecified: Secondary | ICD-10-CM

## 2024-01-06 DIAGNOSIS — H90A31 Mixed conductive and sensorineural hearing loss, unilateral, right ear with restricted hearing on the contralateral side: Secondary | ICD-10-CM

## 2024-01-06 NOTE — Patient Instructions (Signed)
 I have ordered an imaging study for you to complete prior to your next visit. Please call [facility] Scheduling at [phone] to schedule your imaging if you have not received a call within 24 hours. If you are unable to complete your imaging study prior to your next scheduled visit please call our office to let us  know.

## 2024-01-06 NOTE — Progress Notes (Signed)
 Dear Dr. Wonda, Here is my assessment for our mutual patient, Beth [name]. Thank you for allowing me the opportunity to care for your patient. Please do not hesitate to contact me should you have any other questions. Sincerely, Chyrl Cohen PA-C  Otolaryngology Clinic Note Referring provider: Dr. Wonda HPI:  Beth [name] is a 68 y.o. female kindly referred by Dr. Wonda   The patient is a 68 year old female seen in our office for evaluation of hearing loss.  The patient notes approximately 2 years ago she started developing decreased hearing in the right ear.  She notes this is slowly progressed.  She denies any associated pain, minimal ringing.  No preceding trauma, no history of recurrent ear infections as an adult or as a child.  She notes that she does have some minimal dizziness with head movements and does often feel off balance, no significant vertiginous symptoms no other neurologic symptoms.   Independent Review of Additional Tests or Records:  Audiological evaluation 01/06/2024     PMH/Meds/All/SocHx/FamHx/ROS:   Past Medical History:  Diagnosis Date   Anal condyloma    resected with hemorrhoidectomy 1/24   Anxiety    Cancer (HCC)    cervical,kidney   Chronic constipation    followed by dr avram   CKD (chronic kidney disease), stage III (HCC)    Enterocele    History of cervical cancer    hx squamous cell cervical carcinoma,   s/p  radical hysterectomy 11-24-1999   History of MRSA infection    2005--- buttock abscess   History of pulmonary embolus (PE)    post op 10-09-2017 (surgery on 09-19-2017)  lower range pulmonary artery,  resolved and completed 6 month Eliquis    History of renal cell carcinoma    s/p  left nephrectomy 09-19-2017  (oncologist -- dr amadeo / urologsit-- dr bell)   History of seizure    10/ 2001  one episode per discharge note in epic, 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 (pt had just been dx with lupus);   (05-21-2019 per pt none since)   Hyperlipidemia    Incisional hernia    MDD (major depressive disorder)    OA (osteoarthritis)    OSA on CPAP    followed by dr dohmeier   Pre-diabetes    Rectocele    RLS (restless legs syndrome) 01/24/2015   Per sleep study 05/2014    Sleep apnea    wear c pap   Solitary right kidney    acquired 09-19-2017 s/p left nephrectomy   SUI (stress urinary incontinence, female)    05-21-2019  improved with therapy   Systemic lupus erythematosus (HCC)    rheumotologist--- dr mai (lov 03-19-2019 in epic)   Vitamin D  deficiency    Wears glasses      Past Surgical History:  Procedure Laterality Date   CESAREAN SECTION  x3   last one 1980   COLD KNIFE CERVICAL CONIZATION   10/07/1999  @[facility]    COLONOSCOPY  08/03/2022   EXCISIONAL HEMORRHOIDECTOMY  06/2022   hemorrhoids + condyloma   EXPLORATORY LAPAROTOMY MODIFIED RADICAL HYSTERECTOMY WITH PELVIC LYMPHADENECTOMY AND RESECTION MECKEL'S DIVERTICULUM  11-24-1999 dr olean  @[facility]    INCISIONAL HERNIA REPAIR N/A 05/23/2019   Procedure: LAPAROSCOPIC INCISIONAL HERNIA REPAIR WITH MESH;  Surgeon: Kinsinger, Herlene Righter, MD;  Location: [facility];  Service: General;  Laterality: N/A;   LAPAROSCOPIC NEPHRECTOMY, HAND ASSISTED Left 09/19/2017   Procedure: LEFT HAND ASSISTED LAPAROSCOPIC NEPHRECTOMY;  Surgeon: Carolee Beagle  D III, MD;  Location: [facility];  Service: Urology;  Laterality: Left;   RECTOCELE REPAIR N/A 09/10/2021   Procedure: POSTERIOR REPAIR (RECTOCELE);  Surgeon: Marilynne Rosaline SAILOR, MD;  Location: [facility];  Service: Gynecology;  Laterality: N/A;    Family History  Problem Relation Age of Onset   Hyperlipidemia Mother    Hypertension Mother    Heart disease Mother    Colon cancer Neg Hx    Colon polyps Neg Hx    Crohn's disease Neg Hx    Esophageal cancer Neg Hx    Rectal cancer Neg Hx    Stomach cancer Neg Hx    Ulcerative colitis Neg Hx      Social  Connections: Unknown (09/29/2021)   Received from [facility]   Social Network    Social Network: Not on file      Current Outpatient Medications:    acetaminophen  (TYLENOL ) 500 MG tablet, Take 500 mg by mouth every 6 (six) hours as needed for mild pain., Disp: , Rfl:    ALPRAZolam  (XANAX ) 0.5 MG tablet, Take 1 tablet (0.5 mg total) by mouth at bedtime as needed for anxiety., Disp: 2 tablet, Rfl: 0   BENLYSTA 200 MG/ML SOAJ, Inject into the skin., Disp: , Rfl:    buPROPion  (WELLBUTRIN  XL) 150 MG 24 hr tablet, TAKE 1 TABLET EVERY MORNING FOR MOOD, FOCUS & CONCENTRATION, Disp: 90 tablet, Rfl: 1   Cholecalciferol (VITAMIN D ) 2000 UNITS CAPS, Take 1 capsule by mouth daily., Disp: , Rfl:    escitalopram  (LEXAPRO ) 20 MG tablet, TAKE 1.5 TABLETS (30 MG) DAILY FOR MOOD & CHRONIC ANXIETY, Disp: 135 tablet, Rfl: 1   gabapentin  (NEURONTIN ) 300 MG capsule, TAKE 1-2 CAPS AT NIGHT AS NEEDED FOR ANXIETY, PAIN AND SLEEP., Disp: 180 capsule, Rfl: 3   Magnesium 500 MG TABS, Take 500 mg by mouth daily. , Disp: , Rfl:    rosuvastatin  (CRESTOR ) 5 MG tablet, Take 1 tab daily for cholesterol to reduce risk of heart attack/stroke., Disp: 90 tablet, Rfl: 3   traZODone (DESYREL) 50 MG tablet, Take 50 mg by mouth at bedtime., Disp: , Rfl:    Physical Exam:   There were no vitals taken for this visit.  Pertinent Findings  CN II-XII intact Bilateral EAC clear and TM intact with well pneumatized middle ear spaces Left ear with split lobe Weber 512: equal Rinne 512: AC > BC b/l  Anterior rhinoscopy: Septum right deviation; bilateral inferior turbinates with minimal hypertrophy  No lesions of oral cavity/oropharynx; dentition wnl No obviously palpable neck masses/lymphadenopathy/thyromegaly No respiratory distress or stridor   Seprately Identifiable Procedures:  None  Impression & Plans:  Beth [name] is a 68 y.o. 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  She has had this for approximately 2 years.  General includes otosclerosis, low suspicion for any acicular erosion secondary to otitis media given she does not have a history of the same, cholesteatoma, superior semicircular canal dehiscence.  I would like to obtain CT temporal bone for further evaluation, once these are available I like to see the patient back in the office for repeat evaluation or sooner as needed.  The patient verbalized understanding and agreement to today's plan had no further questions or concerns.   - f/u discussion after CT temporal bone completed.   Thank you for allowing me the opportunity to care for your patient. Please do not  hesitate to contact me should you have any other questions.  Sincerely, Chyrl Cohen PA-C [facility] Phone: [phone] Fax: [phone]  01/06/2024, 2:55 PM

## 2024-01-06 NOTE — Progress Notes (Signed)
  [address], [address] [place], [place] [address] [phone]  Audiological Evaluation    Name: [name]     DOB:   1956/01/17      MRN:   [id number]                                                                                     Service Date: 01/06/2024     Accompanied by: unaccompanied   Patient comes today after Beth Cohen, PA-C sent a referral for a hearing evaluation due to concerns with hearing loss.   Symptoms Yes Details  Hearing loss  [x]  Right hearing loss from about 18 months.  Tinnitus  [x]  Right ringing sometimes  Ear pain/ infections/pressure  []    Balance problems  [x]  Off balance when walking  Noise exposure history  []    Previous ear surgeries  []    Family history of hearing loss  []    Amplification  []    Other  []  Reports has lupus.    Otoscopy: Right ear: Clear external ear canal and notable landmarks visualized on the tympanic membrane. Left ear:  Clear external ear canal and notable landmarks visualized on the tympanic membrane.  Tympanometry: Right ear: Type A- Normal external ear canal volume with normal middle ear pressure and tympanic membrane compliance. Left ear: Type A- Normal external ear canal volume with normal middle ear pressure and tympanic membrane compliance.  Acoustic reflexes: Right ear: Absent form 500-200 Hz ipsilaterally and contralaterally. Left Ear: Present ipsilaterally at 85 dB at 500 Hz, and 85dB at 1k and 2k Hz. Absent contralaterally.    Pure tone Audiometry: Right ear- Moderate mixed hearing loss from 250 Hz - 8000 Hz. Left ear-  Normal hearing from [phone] Hz, then mild  sensorineural hearing loss from 4000 Hz - 8000 Hz.  Speech Audiometry: Right ear- Speech Reception Threshold (SRT) was obtained at 50 dBHL, with contralateral masking. Left ear-Speech Reception Threshold (SRT) was obtained at 15 dBHL.   Word Recognition Score Tested using NU-6 (recorded) Right ear: 92% was obtained at a  presentation level of 85 dBHL with contralateral masking which is deemed as  excellent. Left ear: 100% was obtained at a presentation level of 50 dBHL with contralateral masking which is deemed as  excellent.   The hearing test results were completed under headphones and re-checked with inserts and results are deemed to be of good reliability. Test technique:  conventional    Impression: Right hearing asymmetry.    Recommendations: Follow up with ENT as scheduled for today. Return for a hearing evaluation if concerns with hearing changes arise or per MD recommendation. Repeat audiogram after medical care.   Harim Bi MARIE LEROUX-MARTINEZ, AUD

## 2024-01-11 ENCOUNTER — Ambulatory Visit: Admitting: Internal Medicine

## 2024-01-11 ENCOUNTER — Encounter: Payer: Self-pay | Admitting: Internal Medicine

## 2024-01-11 VITALS — BP 122/78 | HR 68 | Ht 61.0 in | Wt 191.2 lb

## 2024-01-11 DIAGNOSIS — M6289 Other specified disorders of muscle: Secondary | ICD-10-CM | POA: Diagnosis not present

## 2024-01-11 DIAGNOSIS — K5909 Other constipation: Secondary | ICD-10-CM

## 2024-01-11 DIAGNOSIS — K469 Unspecified abdominal hernia without obstruction or gangrene: Secondary | ICD-10-CM | POA: Diagnosis not present

## 2024-01-11 DIAGNOSIS — N816 Rectocele: Secondary | ICD-10-CM | POA: Diagnosis not present

## 2024-01-11 MED ORDER — LINACLOTIDE 290 MCG PO CAPS: 290.0000 ug | ORAL_CAPSULE | Freq: Every day | ORAL | 11 refills | Status: AC

## 2024-01-11 NOTE — Progress Notes (Signed)
 Beth [name] 68 y.o. 1956-03-24 [id number]  Assessment & Plan:   Encounter Diagnoses  Name Primary?   High-tone pelvic floor dysfunction Yes   Chronic constipation    Rectocele    Enterocele    Chronic constipation with pelvic floor dysfunction, rectocele, and enterocele. Rectocele repair in 2023 with recurrence.  Symptoms include incomplete bowel movements, rectal pain, heaviness, and pulling sensation in the back. Linzess  72 mcg partially effective. MRI confirmed rectocele and enterocele.  Limited success with physical therapy, peripheral nerve stimulation, and estrogen cream. Considering InterStim device. - Increase Linzess  to 290 mcg to improve bowel movement efficacy. - Follow up with Dr. Emilio of GYN, patient is potentially interested in seeing a different urogynecologist for management of rectocele and enterocele.  She can ask Dr. Emilio.    Follow-up GI as needed.   CC: Turmel, Caleb P, PA  Copy to Dr. Lauraine Emilio Ee gynecology  Subjective:   Chief Complaint: Rectal pain  HPI 68 year old woman with history of high tone pelvic floor dysfunction and levator spasm, coccydynia, chronic constipation and rectocele on MRI, with incontinence as well as refractory overactive bladder and urinary incontinence.  I have seen her in the past for rectal pain and pelvic floor dysfunction and constipation.  She was seeing [facility] and had a posterior repair in 2023 but has returned to [facility] and saw Dr. Kennyth Furth last on 12/14/2023, with diagnoses of the urgency urinary incontinence refractory to anticholinergic therapy, accidental bowel leakage insensible without history of perineal trauma status post hemorrhoidectomy, vaginal rectal bulge symptoms may be secondary to urogenital atrophy no sign of prolapse and genitourinary syndrome of menopause.  The patient had undergone an MRI here in [place] that showed a weak pelvic floor, a rectocele and  enterocele and she reports that Dr. Kennyth Furth told her that there was no rectocele or other abnormality on the MRI.  She was recommended to continue undergo InterStim sacral neuromodulator, because she had a positive outcome with PNE trial.  Add Citrucel and MiraLAX as needed and use topical estrogen.  She was to have a pelvic MRI but that has not been done.  was in that note  She has gone to see Dr. Emilio a gynecologist at [facility] and apparently an ultrasound will be done.  On pelvic exam by Dr. Kennyth Furth and Dr. Emilio the patient reports they could feel stool in the colon.  She has problems with rectal pressure and pain and pain going up the left lower quadrant area when she feels like there is stool in her rectum.  She has started Linzess  at 72 mcg daily and is moving her bowels better but still has incomplete defecation.  In the past she was on 290 mcg of Linzess  with good results.  Urinary incontinence is better but still an issue.  Fecal incontinence is improved.  She had internal hemorrhoid ligation, external hemorrhoid resection and condyloma resection by Dr. Sheldon in February 2024.  Colonoscopy 08/03/2022  - Hemorrhoidectomy anastomosis, characterized by healthy appearing mucosa. - The examination was otherwise normal on direct and retroflexion views. - No specimens collected.   CLINICAL DATA:  Chronic rectal pressure.   EXAM: MRI PELVIS WITHOUT CONTRAST 11/07/2023   TECHNIQUE: Multiplanar multisequence MR imaging of the pelvis was performed following the pelvic organ prolapse/defacography protocol. Ultrasound gel was administered per rectum, and also appears within the vagina. No intravenous contrast was administered.   COMPARISON:  12/26/2017   FINDINGS: Measurements at Rest:  H line:  7.3 cm   M line: 4.1 cm   Levator plate angle:  44 degrees   Measurements during Defecation:   H line: 8.7 cm   M line: 5.6 cm   Levator plate angle:  67 degrees   ANTERIOR  COMPARTMENT   Bladder base descent below PCL: 0 cm   Cystocele grade: None   Anterior urethral hypermobility >30 degrees: No   MIDDLE COMPARTMENT   Vaginal apex/uterine descent below PCL: 0 cm   Grade:  Mild 0-3cm; Moderate 3-6cm; Severe >6cm   CUL-DE-SAC   Enterocele:   Yes   Grade:  Mild, measuring 1.6 cm   POSTERIOR COMPARTMENT   Rectocele: Yes, measuring 3.3 cm   Rectocele Grade: Medium = 2-4cm   Intussception: No   Other Functional Abnormalities noted during defecation: None   Paradoxical puborectal contraction/Anismus: No   Defects in Levator Ani Musculature: None; asymmetric thinning of the right levator ani noted   IMPRESSION: Pelvic floor laxity with medium-size rectocele, similar to previous exam. Small enterocele also noted, not present on prior exam.   No evidence of anterior compartment pelvic floor laxity/prolapse.     Electronically Signed   By: Norleen DELENA Kil M.D.   On: 11/07/2023 16:40  Allergies  Allergen Reactions   Bactrim [Sulfamethoxazole-Trimethoprim ]     GI upset   Cymbalta [Duloxetine Hcl]     Sweating   Acyclovir And Related Itching   Current Meds  Medication Sig   acetaminophen  (TYLENOL ) 500 MG tablet Take 500 mg by mouth every 6 (six) hours as needed for mild pain.   buPROPion  (WELLBUTRIN  XL) 150 MG 24 hr tablet TAKE 1 TABLET EVERY MORNING FOR MOOD, FOCUS & CONCENTRATION   Cholecalciferol (VITAMIN D ) 2000 UNITS CAPS Take 1 capsule by mouth daily.   diazepam  (VALIUM ) 10 MG tablet Place 10 mg vaginally every 8 (eight) hours as needed.   escitalopram  (LEXAPRO ) 20 MG tablet TAKE 1.5 TABLETS (30 MG) DAILY FOR MOOD & CHRONIC ANXIETY   estradiol (ESTRACE) 0.1 MG/GM vaginal cream Place 1 Applicatorful vaginally at bedtime.   LINZESS  72 MCG capsule Take 72 mcg by mouth daily before breakfast.   Magnesium 500 MG TABS Take 500 mg by mouth daily.    rosuvastatin  (CRESTOR ) 5 MG tablet Take 1 tab daily for cholesterol to reduce risk of heart  attack/stroke.   SAPHNELO 300 MG/2ML SOLN injection Inject 300 mg into the vein every 30 (thirty) days.   traZODone (DESYREL) 50 MG tablet Take 50 mg by mouth at bedtime.   Past Medical History:  Diagnosis Date   Anal condyloma    resected with hemorrhoidectomy 1/24   Anxiety    Cancer (HCC)    cervical,kidney   Chronic constipation    followed by dr avram   CKD (chronic kidney disease), stage III (HCC)    Enterocele    History of cervical cancer    hx squamous cell cervical carcinoma,   s/p  radical hysterectomy 11-24-1999   History of MRSA infection    2005--- buttock abscess   History of pulmonary embolus (PE)    post op 10-09-2017 (surgery on 09-19-2017)  lower range pulmonary artery,  resolved and completed 6 month Eliquis    History of renal cell carcinoma    s/p  left nephrectomy 09-19-2017  (oncologist -- dr amadeo / urologsit-- dr bell)   History of seizure    10/ 2001  one episode per discharge note in epic, 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 to  lupus cerebritis (pt had just been dx with lupus);  (05-21-2019 per pt none since)   Hyperlipidemia    Incisional hernia    MDD (major depressive disorder)    OA (osteoarthritis)    OSA on CPAP    followed by dr dohmeier   Pre-diabetes    Rectocele    RLS (restless legs syndrome) 01/24/2015   Per sleep study 05/2014    Sleep apnea    wear c pap   Solitary right kidney    acquired 09-19-2017 s/p left nephrectomy   SUI (stress urinary incontinence, female)    05-21-2019  improved with therapy   Systemic lupus erythematosus (HCC)    rheumotologist--- dr mai (lov 03-19-2019 in epic)   Vitamin D  deficiency    Wears glasses    Past Surgical History:  Procedure Laterality Date   CESAREAN SECTION  x3   last one 1980   COLD KNIFE CERVICAL CONIZATION   10/07/1999  @[facility]    COLONOSCOPY  08/03/2022   EXCISIONAL HEMORRHOIDECTOMY  06/2022   hemorrhoids + condyloma   EXPLORATORY LAPAROTOMY MODIFIED RADICAL  HYSTERECTOMY WITH PELVIC LYMPHADENECTOMY AND RESECTION MECKEL'S DIVERTICULUM  11-24-1999 dr olean  @[facility]    INCISIONAL HERNIA REPAIR N/A 05/23/2019   Procedure: LAPAROSCOPIC INCISIONAL HERNIA REPAIR WITH MESH;  Surgeon: Kinsinger, Herlene Righter, MD;  Location: [facility];  Service: General;  Laterality: N/A;   LAPAROSCOPIC NEPHRECTOMY, HAND ASSISTED Left 09/19/2017   Procedure: LEFT HAND ASSISTED LAPAROSCOPIC NEPHRECTOMY;  Surgeon: Carolee Sherwood JONETTA DOUGLAS, MD;  Location: [facility];  Service: Urology;  Laterality: Left;   RECTOCELE REPAIR N/A 09/10/2021   Procedure: POSTERIOR REPAIR (RECTOCELE);  Surgeon: Marilynne Rosaline SAILOR, MD;  Location: [facility];  Service: Gynecology;  Laterality: N/A;   Social History   Social History Narrative   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   Review of Systems As per HPI  Objective:   Physical Exam BP 122/78   Pulse 68   Ht 5' 1 (1.549 m)   Wt 191 lb 3.2 oz (86.7 kg)   BMI 36.13 kg/m    Patti [place], CMA present. Abdomen is obese soft and non-tender  Rectal exam shows some small anal tags/external hemorrhoids, there is formed brown stool in the vault and there is a laxity in the anterior rectal wall suggestive of a rectocele.

## 2024-01-11 NOTE — Patient Instructions (Addendum)
 We have sent the following medications to your pharmacy for you to pick up at your convenience: Linzess  290 mcg  Please follow up with your GYN Doctor.   I appreciate the opportunity to care for you. Lupita Commander, MD, [facility]

## 2024-01-20 ENCOUNTER — Encounter: Payer: Self-pay | Admitting: Audiology

## 2024-02-10 ENCOUNTER — Telehealth (INDEPENDENT_AMBULATORY_CARE_PROVIDER_SITE_OTHER): Payer: Self-pay

## 2024-02-10 NOTE — Telephone Encounter (Signed)
 Pt called stating that no one had called her about scheduling a CT yet. Wants to know what she needs to do to get the ct done so you can fix her ear lobe.

## 2024-02-10 NOTE — Telephone Encounter (Signed)
 Wants to be seen with jeff again.

## 2024-02-10 NOTE — Telephone Encounter (Signed)
 Can you please give her this number so that she can call them. It was given to her in her AVS at the last visit but she may not have received it. She can call and schedule it. Once the hearing issues is addressed we can work on the ear lobe. Thanks [phone]

## 2024-02-16 ENCOUNTER — Ambulatory Visit (INDEPENDENT_AMBULATORY_CARE_PROVIDER_SITE_OTHER): Admitting: Physician Assistant

## 2024-02-16 NOTE — Progress Notes (Deleted)
 Dear Dr. Wonda, Here is my assessment for our mutual patient, Beth [name]. Thank you for allowing me the opportunity to care for your patient. Please do not hesitate to contact me should you have any other questions. Sincerely, Chyrl Cohen PA-C  Otolaryngology Clinic Note Referring provider: Dr. Wonda HPI:  Beth [name] is a 68 y.o. female kindly referred by Dr. Wonda   The patient ***    Independent Review of Additional Tests or Records:  ***   PMH/Meds/All/SocHx/FamHx/ROS:   Past Medical History:  Diagnosis Date   Anal condyloma    resected with hemorrhoidectomy 1/24   Anxiety    Cancer (HCC)    cervical,kidney   Chronic constipation    followed by dr avram   CKD (chronic kidney disease), stage III (HCC)    Enterocele    History of cervical cancer    hx squamous cell cervical carcinoma,   s/p  radical hysterectomy 11-24-1999   History of MRSA infection    2005--- buttock abscess   History of pulmonary embolus (PE)    post op 10-09-2017 (surgery on 09-19-2017)  lower range pulmonary artery,  resolved and completed 6 month Eliquis    History of renal cell carcinoma    s/p  left nephrectomy 09-19-2017  (oncologist -- dr amadeo / urologsit-- dr bell)   History of seizure    10/ 2001  one episode per discharge note in epic, 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 (pt had just been dx with lupus);  (05-21-2019 per pt none since)   Hyperlipidemia    Incisional hernia    MDD (major depressive disorder)    OA (osteoarthritis)    OSA on CPAP    followed by dr dohmeier   Pre-diabetes    Rectocele    RLS (restless legs syndrome) 01/24/2015   Per sleep study 05/2014    Sleep apnea    wear c pap   Solitary right kidney    acquired 09-19-2017 s/p left nephrectomy   SUI (stress urinary incontinence, female)    05-21-2019  improved with therapy   Systemic lupus erythematosus (HCC)    rheumotologist--- dr mai (lov 03-19-2019 in epic)    Vitamin D  deficiency    Wears glasses      Past Surgical History:  Procedure Laterality Date   CESAREAN SECTION  x3   last one 1980   COLD KNIFE CERVICAL CONIZATION   10/07/1999  @[facility]    COLONOSCOPY  08/03/2022   EXCISIONAL HEMORRHOIDECTOMY  06/2022   hemorrhoids + condyloma   EXPLORATORY LAPAROTOMY MODIFIED RADICAL HYSTERECTOMY WITH PELVIC LYMPHADENECTOMY AND RESECTION MECKEL'S DIVERTICULUM  11-24-1999 dr olean  @[facility]    INCISIONAL HERNIA REPAIR N/A 05/23/2019   Procedure: LAPAROSCOPIC INCISIONAL HERNIA REPAIR WITH MESH;  Surgeon: Kinsinger, Herlene Righter, MD;  Location: [facility];  Service: General;  Laterality: N/A;   LAPAROSCOPIC NEPHRECTOMY, HAND ASSISTED Left 09/19/2017   Procedure: LEFT HAND ASSISTED LAPAROSCOPIC NEPHRECTOMY;  Surgeon: Carolee Sherwood JONETTA DOUGLAS, MD;  Location: [facility];  Service: Urology;  Laterality: Left;   RECTOCELE REPAIR N/A 09/10/2021   Procedure: POSTERIOR REPAIR (RECTOCELE);  Surgeon: Marilynne Rosaline SAILOR, MD;  Location: [facility];  Service: Gynecology;  Laterality: N/A;    Family History  Problem Relation Age of Onset   Hyperlipidemia Mother    Hypertension Mother    Heart disease Mother    Colon cancer Neg Hx    Colon polyps Neg Hx    Crohn's disease Neg Hx  Esophageal cancer Neg Hx    Rectal cancer Neg Hx    Stomach cancer Neg Hx    Ulcerative colitis Neg Hx      Social Connections: Unknown (09/29/2021)   Received from [facility]   Social Network    Social Network: Not on file      Current Outpatient Medications:    acetaminophen  (TYLENOL ) 500 MG tablet, Take 500 mg by mouth every 6 (six) hours as needed for mild pain., Disp: , Rfl:    buPROPion  (WELLBUTRIN  XL) 150 MG 24 hr tablet, TAKE 1 TABLET EVERY MORNING FOR MOOD, FOCUS & CONCENTRATION, Disp: 90 tablet, Rfl: 1   Cholecalciferol (VITAMIN D ) 2000 UNITS CAPS, Take 1 capsule by mouth daily., Disp: , Rfl:    diazepam  (VALIUM ) 10 MG tablet, Place 10 mg  vaginally every 8 (eight) hours as needed., Disp: , Rfl:    escitalopram  (LEXAPRO ) 20 MG tablet, TAKE 1.5 TABLETS (30 MG) DAILY FOR MOOD & CHRONIC ANXIETY, Disp: 135 tablet, Rfl: 1   estradiol (ESTRACE) 0.1 MG/GM vaginal cream, Place 1 Applicatorful vaginally at bedtime., Disp: , Rfl:    linaclotide  (LINZESS ) 290 MCG CAPS capsule, Take 1 capsule (290 mcg total) by mouth daily before breakfast., Disp: 30 capsule, Rfl: 11   Magnesium 500 MG TABS, Take 500 mg by mouth daily. , Disp: , Rfl:    metFORMIN (GLUCOPHAGE-XR) 500 MG 24 hr tablet, Take 500 mg by mouth daily., Disp: , Rfl:    rosuvastatin  (CRESTOR ) 5 MG tablet, Take 1 tab daily for cholesterol to reduce risk of heart attack/stroke., Disp: 90 tablet, Rfl: 3   SAPHNELO 300 MG/2ML SOLN injection, Inject 300 mg into the vein every 30 (thirty) days., Disp: , Rfl:    traZODone (DESYREL) 50 MG tablet, Take 50 mg by mouth at bedtime., Disp: , Rfl:    Physical Exam:   There were no vitals taken for this visit.  Pertinent Findings  CN II-XII intact ***Bilateral EAC clear and TM intact with well pneumatized middle ear spaces Weber 512: equal Rinne 512: AC > BC b/l  Anterior rhinoscopy: Septum ***; bilateral inferior turbinates with *** No lesions of oral cavity/oropharynx; dentition *** No obviously palpable neck masses/lymphadenopathy/thyromegaly No respiratory distress or stridor  Seprately Identifiable Procedures:  None***  Impression & Plans:  Beth [name] is a 68 y.o. female with the following   ***   - f/u ***   Thank you for allowing me the opportunity to care for your patient. Please do not hesitate to contact me should you have any other questions.  Sincerely, Chyrl Cohen PA-C [facility] Phone: [phone] Fax: [phone]  02/16/2024, 10:40 AM

## 2024-02-17 ENCOUNTER — Ambulatory Visit (HOSPITAL_COMMUNITY)
Admission: RE | Admit: 2024-02-17 | Discharge: 2024-02-17 | Disposition: A | Discharge status: Home / Self Care | Source: Ambulatory Visit | Attending: Physician Assistant | Admitting: Physician Assistant

## 2024-02-17 DIAGNOSIS — H9071 Mixed conductive and sensorineural hearing loss, unilateral, right ear, with unrestricted hearing on the contralateral side: Secondary | ICD-10-CM | POA: Diagnosis present

## 2024-03-05 ENCOUNTER — Telehealth (INDEPENDENT_AMBULATORY_CARE_PROVIDER_SITE_OTHER): Payer: Self-pay | Admitting: Physician Assistant

## 2024-03-05 ENCOUNTER — Encounter (INDEPENDENT_AMBULATORY_CARE_PROVIDER_SITE_OTHER): Payer: Self-pay | Admitting: Physician Assistant

## 2024-03-05 ENCOUNTER — Ambulatory Visit (INDEPENDENT_AMBULATORY_CARE_PROVIDER_SITE_OTHER): Admitting: Physician Assistant

## 2024-03-05 VITALS — BP 157/93 | HR 78 | Ht 61.5 in | Wt 187.0 lb

## 2024-03-05 DIAGNOSIS — S01312D Laceration without foreign body of left ear, subsequent encounter: Secondary | ICD-10-CM

## 2024-03-05 NOTE — Progress Notes (Signed)
 Dear Dr. Wonda, Here is my assessment for our mutual patient, Beth [name]. Thank you for allowing me the opportunity to care for your patient. Please do not hesitate to contact me should you have any other questions. Sincerely, Chyrl Cohen PA-C  Otolaryngology Clinic Note Referring provider: Dr. Wonda HPI:  Beth [name] is a 68 y.o. female kindly referred by Dr. Wonda   The patient is a 68 year old female seen in our office for repair of left earlobe.  The patient had a laceration through her left earlobe approximately 10 years ago, resulting in a split earlobe.  Given the deformity she is requesting repair.  No infectious signs or symptoms.  No pain.   Independent Review of Additional Tests or Records:     PMH/Meds/All/SocHx/FamHx/ROS:   Past Medical History:  Diagnosis Date   Anal condyloma    resected with hemorrhoidectomy 1/24   Anxiety    Cancer (HCC)    cervical,kidney   Chronic constipation    followed by dr avram   CKD (chronic kidney disease), stage III (HCC)    Enterocele    History of cervical cancer    hx squamous cell cervical carcinoma,   s/p  radical hysterectomy 11-24-1999   History of MRSA infection    2005--- buttock abscess   History of pulmonary embolus (PE)    post op 10-09-2017 (surgery on 09-19-2017)  lower range pulmonary artery,  resolved and completed 6 month Eliquis    History of renal cell carcinoma    s/p  left nephrectomy 09-19-2017  (oncologist -- dr amadeo / urologsit-- dr bell)   History of seizure    10/ 2001  one episode per discharge note in epic, 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 (pt had just been dx with lupus);  (05-21-2019 per pt none since)   Hyperlipidemia    Incisional hernia    MDD (major depressive disorder)    OA (osteoarthritis)    OSA on CPAP    followed by dr dohmeier   Pre-diabetes    Rectocele    RLS (restless legs syndrome) 01/24/2015   Per sleep study 05/2014    Sleep apnea     wear c pap   Solitary right kidney    acquired 09-19-2017 s/p left nephrectomy   SUI (stress urinary incontinence, female)    05-21-2019  improved with therapy   Systemic lupus erythematosus (HCC)    rheumotologist--- dr mai (lov 03-19-2019 in epic)   Vitamin D  deficiency    Wears glasses      Past Surgical History:  Procedure Laterality Date   CESAREAN SECTION  x3   last one 1980   COLD KNIFE CERVICAL CONIZATION   10/07/1999  @[facility]    COLONOSCOPY  08/03/2022   EXCISIONAL HEMORRHOIDECTOMY  06/2022   hemorrhoids + condyloma   EXPLORATORY LAPAROTOMY MODIFIED RADICAL HYSTERECTOMY WITH PELVIC LYMPHADENECTOMY AND RESECTION MECKEL'S DIVERTICULUM  11-24-1999 dr olean  @[facility]    INCISIONAL HERNIA REPAIR N/A 05/23/2019   Procedure: LAPAROSCOPIC INCISIONAL HERNIA REPAIR WITH MESH;  Surgeon: Kinsinger, Herlene Righter, MD;  Location: [facility];  Service: General;  Laterality: N/A;   LAPAROSCOPIC NEPHRECTOMY, HAND ASSISTED Left 09/19/2017   Procedure: LEFT HAND ASSISTED LAPAROSCOPIC NEPHRECTOMY;  Surgeon: Carolee Sherwood JONETTA DOUGLAS, MD;  Location: [facility];  Service: Urology;  Laterality: Left;   RECTOCELE REPAIR N/A 09/10/2021   Procedure: POSTERIOR REPAIR (RECTOCELE);  Surgeon: Marilynne Rosaline SAILOR, MD;  Location: [facility];  Service: Gynecology;  Laterality: N/A;  Family History  Problem Relation Age of Onset   Hyperlipidemia Mother    Hypertension Mother    Heart disease Mother    Colon cancer Neg Hx    Colon polyps Neg Hx    Crohn's disease Neg Hx    Esophageal cancer Neg Hx    Rectal cancer Neg Hx    Stomach cancer Neg Hx    Ulcerative colitis Neg Hx      Social Connections: Unknown (09/29/2021)   Received from [facility]   Social Network    Social Network: Not on file      Current Outpatient Medications:    acetaminophen  (TYLENOL ) 500 MG tablet, Take 500 mg by mouth every 6 (six) hours as needed for mild pain., Disp: , Rfl:    buPROPion   (WELLBUTRIN  XL) 150 MG 24 hr tablet, TAKE 1 TABLET EVERY MORNING FOR MOOD, FOCUS & CONCENTRATION, Disp: 90 tablet, Rfl: 1   Cholecalciferol (VITAMIN D ) 2000 UNITS CAPS, Take 1 capsule by mouth daily., Disp: , Rfl:    diazepam  (VALIUM ) 10 MG tablet, Place 10 mg vaginally every 8 (eight) hours as needed., Disp: , Rfl:    escitalopram  (LEXAPRO ) 20 MG tablet, TAKE 1.5 TABLETS (30 MG) DAILY FOR MOOD & CHRONIC ANXIETY, Disp: 135 tablet, Rfl: 1   estradiol (ESTRACE) 0.1 MG/GM vaginal cream, Place 1 Applicatorful vaginally at bedtime., Disp: , Rfl:    linaclotide  (LINZESS ) 290 MCG CAPS capsule, Take 1 capsule (290 mcg total) by mouth daily before breakfast., Disp: 30 capsule, Rfl: 11   Magnesium 500 MG TABS, Take 500 mg by mouth daily. , Disp: , Rfl:    metFORMIN (GLUCOPHAGE-XR) 500 MG 24 hr tablet, Take 500 mg by mouth daily., Disp: , Rfl:    rosuvastatin  (CRESTOR ) 5 MG tablet, Take 1 tab daily for cholesterol to reduce risk of heart attack/stroke., Disp: 90 tablet, Rfl: 3   SAPHNELO 300 MG/2ML SOLN injection, Inject 300 mg into the vein every 30 (thirty) days., Disp: , Rfl:    traZODone (DESYREL) 50 MG tablet, Take 50 mg by mouth at bedtime., Disp: , Rfl:    Physical Exam:   BP (!) 157/93 (BP Location: Left Arm, Cuff Size: Normal)   Pulse 78   Ht 5' 1.5 (1.562 m)   Wt 187 lb (84.8 kg)   SpO2 97%   BMI 34.76 kg/m   Pertinent Findings  Left earlobe with complete laceration that has healed with scarring, another near complete laceration posterior to this  Seprately Identifiable Procedures:  Preoperative Dx: Left split ear lobe  Postoperative Dx: Left split ear lobe  Procedure: repair of left split ear lobe  Anesthesia: Lidocaine  1%   Indication for Procedure: split ear lobes  Description of Procedure: Risks and complications were explained to the patient. Consent was confirmed and signed. Time out was called and all information was confirmed to be correct. The ear lobe was prepped with  betadine and drapped.  Greater auricular nerve was anesthetized with 1% lidocaine  using field block technique. After waiting several minutes for the lidocaine  to take affect an #11 blade was used to make the incisions which included cutting out the previous epithelialized skin edges. A 5-0 Monocryl was used to reapproximate the skin edges. The patient is to follow up in one week. She tolerated the procedure well and there were no complications. Risks and complicatins were discussed and consent signed.   Impression & Plans:  Beth [name] is a 68 y.o. female with the following  Earlobe repair-  Completed without difficulty, the patient tolerated this well.  I would like to see her back in the office in 1 week for repeat evaluation.   - f/u 1 week   Thank you for allowing me the opportunity to care for your patient. Please do not hesitate to contact me should you have any other questions.  Sincerely, Chyrl Cohen PA-C [facility] Phone: [phone] Fax: [phone]  03/05/2024, 4:16 PM

## 2024-03-05 NOTE — Telephone Encounter (Signed)
 I spoke with the patient at check out, she mentioned that Chyrl was going to try to see if insurance will cover this procedure.  This information was confirmed with Olam and Dr Roark.  Per Dr Roark, I let her know we are going to run it through insurance but to be aware there is about a 95% chance she will owe $350 if it is deemed cosmetic.  The patient voiced understanding. I did not collect a payment today.

## 2024-03-06 ENCOUNTER — Telehealth (INDEPENDENT_AMBULATORY_CARE_PROVIDER_SITE_OTHER): Payer: Self-pay

## 2024-03-06 NOTE — Telephone Encounter (Signed)
 Pt called stated that she does not see the medication you asked her to use to clean her ears and requested a call back.

## 2024-03-07 MED ORDER — BACITRACIN 500 UNIT/GM EX OINT: 1.0000 | TOPICAL_OINTMENT | Freq: Two times a day (BID) | CUTANEOUS | 0 refills | Status: AC

## 2024-03-07 NOTE — Addendum Note (Signed)
 Addended byBETHA COHEN, Maribel Luis on: 03/07/2024 08:45 AM   Modules accepted: Orders

## 2024-03-07 NOTE — Telephone Encounter (Signed)
 Please let her know it should be at her pharmacy now

## 2024-03-07 NOTE — Telephone Encounter (Signed)
 thanks

## 2024-03-07 NOTE — Telephone Encounter (Signed)
 Called Pt let her know and she understood.

## 2024-03-12 ENCOUNTER — Other Ambulatory Visit: Payer: Self-pay | Admitting: Medical Genetics

## 2024-03-12 ENCOUNTER — Encounter (INDEPENDENT_AMBULATORY_CARE_PROVIDER_SITE_OTHER): Payer: Self-pay | Admitting: Physician Assistant

## 2024-03-12 ENCOUNTER — Ambulatory Visit (INDEPENDENT_AMBULATORY_CARE_PROVIDER_SITE_OTHER): Admitting: Physician Assistant

## 2024-03-12 VITALS — BP 150/81 | HR 66 | Temp 97.8°F

## 2024-03-12 DIAGNOSIS — H90A31 Mixed conductive and sensorineural hearing loss, unilateral, right ear with restricted hearing on the contralateral side: Secondary | ICD-10-CM | POA: Diagnosis not present

## 2024-03-12 DIAGNOSIS — Z006 Encounter for examination for normal comparison and control in clinical research program: Secondary | ICD-10-CM

## 2024-03-12 NOTE — Progress Notes (Signed)
 Dear Dr. Wonda, Here is my assessment for our mutual patient, Beth [name]. Thank you for allowing me the opportunity to care for your patient. Please do not hesitate to contact me should you have any other questions. Sincerely, Chyrl Cohen PA-C  Otolaryngology Clinic Note Referring provider: Dr. Wonda HPI:  Beth [name] is a 68 y.o. female kindly referred by Dr. Wonda   The patient is a 68 year old female seen in our office for follow-up evaluation status post left earlobe repair on 03/05/2024.  She reports no pain, minimal swelling, and no significant redness or drainage. Bacitracin is being applied as instructed. No significant bleeding post-surgery, only a little bleeding initially.  She also is currently under evaluation for decreased hearing out of her right ear.  She has experienced hearing loss in her right ear for approximately five years, which has been progressively worsening. The hearing loss is mixed, with both conductive and sensorineural components. A recent CT scan was normal, showing no bone damage or fluid accumulation. She denies any history of surgery on the right ear.  Independent Review of Additional Tests or Records:  None   PMH/Meds/All/SocHx/FamHx/ROS:   Past Medical History:  Diagnosis Date   Anal condyloma    resected with hemorrhoidectomy 1/24   Anxiety    Cancer (HCC)    cervical,kidney   Chronic constipation    followed by dr avram   CKD (chronic kidney disease), stage III (HCC)    Enterocele    History of cervical cancer    hx squamous cell cervical carcinoma,   s/p  radical hysterectomy 11-24-1999   History of MRSA infection    2005--- buttock abscess   History of pulmonary embolus (PE)    post op 10-09-2017 (surgery on 09-19-2017)  lower range pulmonary artery,  resolved and completed 6 month Eliquis    History of renal cell carcinoma    s/p  left nephrectomy 09-19-2017  (oncologist -- dr amadeo / urologsit-- dr bell)   History of  seizure    10/ 2001  one episode per discharge note in epic, 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 (pt had just been dx with lupus);  (05-21-2019 per pt none since)   Hyperlipidemia    Incisional hernia    MDD (major depressive disorder)    OA (osteoarthritis)    OSA on CPAP    followed by dr dohmeier   Pre-diabetes    Rectocele    RLS (restless legs syndrome) 01/24/2015   Per sleep study 05/2014    Sleep apnea    wear c pap   Solitary right kidney    acquired 09-19-2017 s/p left nephrectomy   SUI (stress urinary incontinence, female)    05-21-2019  improved with therapy   Systemic lupus erythematosus (HCC)    rheumotologist--- dr mai (lov 03-19-2019 in epic)   Vitamin D  deficiency    Wears glasses      Past Surgical History:  Procedure Laterality Date   CESAREAN SECTION  x3   last one 1980   COLD KNIFE CERVICAL CONIZATION   10/07/1999  @[facility]    COLONOSCOPY  08/03/2022   EXCISIONAL HEMORRHOIDECTOMY  06/2022   hemorrhoids + condyloma   EXPLORATORY LAPAROTOMY MODIFIED RADICAL HYSTERECTOMY WITH PELVIC LYMPHADENECTOMY AND RESECTION MECKEL'S DIVERTICULUM  11-24-1999 dr olean  @[facility]    INCISIONAL HERNIA REPAIR N/A 05/23/2019   Procedure: LAPAROSCOPIC INCISIONAL HERNIA REPAIR WITH MESH;  Surgeon: Kinsinger, Herlene Righter, MD;  Location: [facility] ;  Service: General;  Laterality: N/A;  LAPAROSCOPIC NEPHRECTOMY, HAND ASSISTED Left 09/19/2017   Procedure: LEFT HAND ASSISTED LAPAROSCOPIC NEPHRECTOMY;  Surgeon: Carolee Sherwood JONETTA DOUGLAS, MD;  Location: [facility];  Service: Urology;  Laterality: Left;   RECTOCELE REPAIR N/A 09/10/2021   Procedure: POSTERIOR REPAIR (RECTOCELE);  Surgeon: Marilynne Rosaline SAILOR, MD;  Location: [facility];  Service: Gynecology;  Laterality: N/A;    Family History  Problem Relation Age of Onset   Hyperlipidemia Mother    Hypertension Mother    Heart disease Mother    Colon cancer Neg Hx    Colon  polyps Neg Hx    Crohn's disease Neg Hx    Esophageal cancer Neg Hx    Rectal cancer Neg Hx    Stomach cancer Neg Hx    Ulcerative colitis Neg Hx      Social Connections: Unknown (09/29/2021)   Received from [facility]   Social Network    Social Network: Not on file      Current Outpatient Medications:    acetaminophen  (TYLENOL ) 500 MG tablet, Take 500 mg by mouth every 6 (six) hours as needed for mild pain., Disp: , Rfl:    bacitracin 500 UNIT/GM ointment, Apply 1 Application topically 2 (two) times daily., Disp: 15 g, Rfl: 0   buPROPion  (WELLBUTRIN  XL) 150 MG 24 hr tablet, TAKE 1 TABLET EVERY MORNING FOR MOOD, FOCUS & CONCENTRATION, Disp: 90 tablet, Rfl: 1   Cholecalciferol (VITAMIN D ) 2000 UNITS CAPS, Take 1 capsule by mouth daily., Disp: , Rfl:    diazepam  (VALIUM ) 10 MG tablet, Place 10 mg vaginally every 8 (eight) hours as needed., Disp: , Rfl:    escitalopram  (LEXAPRO ) 20 MG tablet, TAKE 1.5 TABLETS (30 MG) DAILY FOR MOOD & CHRONIC ANXIETY, Disp: 135 tablet, Rfl: 1   estradiol (ESTRACE) 0.1 MG/GM vaginal cream, Place 1 Applicatorful vaginally at bedtime., Disp: , Rfl:    linaclotide  (LINZESS ) 290 MCG CAPS capsule, Take 1 capsule (290 mcg total) by mouth daily before breakfast., Disp: 30 capsule, Rfl: 11   Magnesium 500 MG TABS, Take 500 mg by mouth daily. , Disp: , Rfl:    metFORMIN (GLUCOPHAGE-XR) 500 MG 24 hr tablet, Take 500 mg by mouth daily., Disp: , Rfl:    rosuvastatin  (CRESTOR ) 5 MG tablet, Take 1 tab daily for cholesterol to reduce risk of heart attack/stroke., Disp: 90 tablet, Rfl: 3   SAPHNELO 300 MG/2ML SOLN injection, Inject 300 mg into the vein every 30 (thirty) days., Disp: , Rfl:    traZODone (DESYREL) 50 MG tablet, Take 50 mg by mouth at bedtime., Disp: , Rfl:    Physical Exam:   BP (!) 150/81   Pulse 66   Temp 97.8 F (36.6 C)   SpO2 99%   Pertinent Findings  CN II-XII intact Left earlobe with dissolvable stitches in place, well-approximated with no  drainage, redness, swelling, right EAC clear, TM intact with well-pneumatized middle ear space, left EAC clear with intact TM and well-pneumatized middle ear space No lesions of oral cavity/oropharynx; dentition within normal limits No obviously palpable neck masses/lymphadenopathy/thyromegaly No respiratory distress or stridor  Seprately Identifiable Procedures:  None  Impression & Plans:  Jamilynn Whitacre is a 68 y.o. 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  Will discuss the audiological results and CT findings with physician and provide recommendations moving forward.  Status post earlobe repair-  Doing very well today, wound healing  as expected.  Patient will use Vaseline daily until completely healed, she will call with any questions or concerns she has in the meantime.     - f/u phone call discussion with plan for hearing loss   Thank you for allowing me the opportunity to care for your patient. Please do not hesitate to contact me should you have any other questions.  Sincerely, Chyrl Cohen PA-C [facility] Phone: [phone] Fax: [phone]  03/12/2024, 1:29 PM

## 2024-03-12 NOTE — Progress Notes (Signed)
 Advised that patient see PCP regarding BP.

## 2024-04-03 ENCOUNTER — Telehealth (INDEPENDENT_AMBULATORY_CARE_PROVIDER_SITE_OTHER): Payer: Self-pay | Admitting: Physician Assistant

## 2024-04-03 NOTE — Telephone Encounter (Signed)
 I spoke with Beth [name] today, her earlobe has healed very well.  I recommend waiting at least 6 months prior to any piercing.  As far as her hearing goes I would recommend hearing aids on the right ear secondary to audiological evaluation with findings consistent with otosclerosis.  Again she had a reassuring CT with no significant abnormalities.  She was given information for local hearing aid providers in the community.  I encouraged her to reach out with me with any further questions or concerns she may have.

## 2024-04-06 ENCOUNTER — Encounter: Payer: Self-pay | Admitting: Oncology

## 2024-04-09 LAB — GENECONNECT MOLECULAR SCREEN: Genetic Analysis Overall Interpretation: NEGATIVE

## 2024-05-28 ENCOUNTER — Encounter: Payer: Self-pay | Admitting: *Deleted
